# Patient Record
Sex: Male | Born: 1945 | Race: White | Hispanic: No | Marital: Married | Smoking: Current every day smoker
Health system: Southern US, Community
[De-identification: ages and names within clinical notes are randomized; demographics above are authoritative.]

## PROBLEM LIST (undated history)

## (undated) DIAGNOSIS — N529 Male erectile dysfunction, unspecified: Secondary | ICD-10-CM

## (undated) DIAGNOSIS — K649 Unspecified hemorrhoids: Secondary | ICD-10-CM

## (undated) DIAGNOSIS — J45909 Unspecified asthma, uncomplicated: Secondary | ICD-10-CM

## (undated) DIAGNOSIS — I5189 Other ill-defined heart diseases: Secondary | ICD-10-CM

## (undated) DIAGNOSIS — F419 Anxiety disorder, unspecified: Secondary | ICD-10-CM

## (undated) DIAGNOSIS — K7030 Alcoholic cirrhosis of liver without ascites: Secondary | ICD-10-CM

## (undated) DIAGNOSIS — I861 Scrotal varices: Secondary | ICD-10-CM

## (undated) DIAGNOSIS — Z9289 Personal history of other medical treatment: Secondary | ICD-10-CM

## (undated) DIAGNOSIS — M549 Dorsalgia, unspecified: Secondary | ICD-10-CM

## (undated) DIAGNOSIS — I8500 Esophageal varices without bleeding: Secondary | ICD-10-CM

## (undated) DIAGNOSIS — I519 Heart disease, unspecified: Secondary | ICD-10-CM

## (undated) DIAGNOSIS — K5730 Diverticulosis of large intestine without perforation or abscess without bleeding: Secondary | ICD-10-CM

## (undated) DIAGNOSIS — K219 Gastro-esophageal reflux disease without esophagitis: Secondary | ICD-10-CM

## (undated) DIAGNOSIS — H9190 Unspecified hearing loss, unspecified ear: Secondary | ICD-10-CM

## (undated) DIAGNOSIS — K766 Portal hypertension: Secondary | ICD-10-CM

## (undated) DIAGNOSIS — C4491 Basal cell carcinoma of skin, unspecified: Secondary | ICD-10-CM

## (undated) DIAGNOSIS — A045 Campylobacter enteritis: Secondary | ICD-10-CM

## (undated) DIAGNOSIS — Z87898 Personal history of other specified conditions: Secondary | ICD-10-CM

## (undated) DIAGNOSIS — Z8709 Personal history of other diseases of the respiratory system: Secondary | ICD-10-CM

## (undated) DIAGNOSIS — H269 Unspecified cataract: Secondary | ICD-10-CM

## (undated) DIAGNOSIS — D696 Thrombocytopenia, unspecified: Secondary | ICD-10-CM

## (undated) DIAGNOSIS — N433 Hydrocele, unspecified: Secondary | ICD-10-CM

## (undated) DIAGNOSIS — I10 Essential (primary) hypertension: Secondary | ICD-10-CM

## (undated) DIAGNOSIS — F1120 Opioid dependence, uncomplicated: Secondary | ICD-10-CM

## (undated) DIAGNOSIS — F32A Depression, unspecified: Secondary | ICD-10-CM

## (undated) DIAGNOSIS — R161 Splenomegaly, not elsewhere classified: Secondary | ICD-10-CM

## (undated) DIAGNOSIS — F1020 Alcohol dependence, uncomplicated: Secondary | ICD-10-CM

## (undated) DIAGNOSIS — K5520 Angiodysplasia of colon without hemorrhage: Secondary | ICD-10-CM

## (undated) DIAGNOSIS — M51369 Other intervertebral disc degeneration, lumbar region without mention of lumbar back pain or lower extremity pain: Secondary | ICD-10-CM

## (undated) DIAGNOSIS — N5089 Other specified disorders of the male genital organs: Secondary | ICD-10-CM

## (undated) DIAGNOSIS — G479 Sleep disorder, unspecified: Secondary | ICD-10-CM

## (undated) DIAGNOSIS — D62 Acute posthemorrhagic anemia: Secondary | ICD-10-CM

## (undated) DIAGNOSIS — M5136 Other intervertebral disc degeneration, lumbar region: Secondary | ICD-10-CM

## (undated) DIAGNOSIS — K259 Gastric ulcer, unspecified as acute or chronic, without hemorrhage or perforation: Secondary | ICD-10-CM

## (undated) DIAGNOSIS — K922 Gastrointestinal hemorrhage, unspecified: Secondary | ICD-10-CM

## (undated) DIAGNOSIS — Z86010 Personal history of colonic polyps: Secondary | ICD-10-CM

## (undated) DIAGNOSIS — I82411 Acute embolism and thrombosis of right femoral vein: Secondary | ICD-10-CM

## (undated) DIAGNOSIS — F329 Major depressive disorder, single episode, unspecified: Secondary | ICD-10-CM

## (undated) HISTORY — PX: POLYPECTOMY: SHX149

## (undated) HISTORY — DX: Alcoholic cirrhosis of liver without ascites: K70.30

## (undated) HISTORY — DX: Gastro-esophageal reflux disease without esophagitis: K21.9

## (undated) HISTORY — DX: Unspecified cataract: H26.9

## (undated) HISTORY — DX: Anxiety disorder, unspecified: F41.9

## (undated) HISTORY — DX: Male erectile dysfunction, unspecified: N52.9

## (undated) HISTORY — DX: Basal cell carcinoma of skin, unspecified: C44.91

## (undated) HISTORY — DX: Other specified disorders of the male genital organs: N50.89

## (undated) HISTORY — DX: Esophageal varices without bleeding: I85.00

## (undated) HISTORY — DX: Gastrointestinal hemorrhage, unspecified: K92.2

## (undated) HISTORY — DX: Unspecified asthma, uncomplicated: J45.909

## (undated) HISTORY — PX: EYE SURGERY: SHX253

## (undated) HISTORY — DX: Acute posthemorrhagic anemia: D62

## (undated) HISTORY — PX: COLONOSCOPY: SHX174

## (undated) HISTORY — DX: Unspecified hemorrhoids: K64.9

## (undated) HISTORY — DX: Personal history of colonic polyps: Z86.010

## (undated) HISTORY — DX: Scrotal varices: I86.1

## (undated) HISTORY — PX: OTHER SURGICAL HISTORY: SHX169

## (undated) HISTORY — DX: Sleep disorder, unspecified: G47.9

## (undated) HISTORY — DX: Gastric ulcer, unspecified as acute or chronic, without hemorrhage or perforation: K25.9

## (undated) HISTORY — DX: Thrombocytopenia, unspecified: D69.6

## (undated) HISTORY — DX: Alcohol dependence, uncomplicated: F10.20

## (undated) HISTORY — DX: Depression, unspecified: F32.A

## (undated) HISTORY — DX: Major depressive disorder, single episode, unspecified: F32.9

## (undated) HISTORY — DX: Diverticulosis of large intestine without perforation or abscess without bleeding: K57.30

## (undated) HISTORY — DX: Campylobacter enteritis: A04.5

## (undated) HISTORY — PX: COLONOSCOPY: SHX5424

## (undated) HISTORY — DX: Essential (primary) hypertension: I10

## (undated) HISTORY — DX: Angiodysplasia of colon without hemorrhage: K55.20

---
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
## 1999-12-11 ENCOUNTER — Encounter: Payer: Self-pay | Admitting: Emergency Medicine

## 1999-12-11 ENCOUNTER — Emergency Department (HOSPITAL_COMMUNITY): Admission: EM | Admit: 1999-12-11 | Discharge: 1999-12-11 | Payer: Self-pay | Admitting: Emergency Medicine

## 2000-01-29 ENCOUNTER — Encounter (INDEPENDENT_AMBULATORY_CARE_PROVIDER_SITE_OTHER): Payer: Self-pay

## 2000-01-29 ENCOUNTER — Ambulatory Visit (HOSPITAL_COMMUNITY): Admission: RE | Admit: 2000-01-29 | Discharge: 2000-01-29 | Payer: Self-pay | Admitting: Gastroenterology

## 2001-05-13 ENCOUNTER — Encounter: Admission: RE | Admit: 2001-05-13 | Discharge: 2001-05-13 | Payer: Self-pay | Admitting: Family Medicine

## 2001-05-13 ENCOUNTER — Encounter: Payer: Self-pay | Admitting: Family Medicine

## 2002-02-24 ENCOUNTER — Encounter: Payer: Self-pay | Admitting: Family Medicine

## 2002-02-24 ENCOUNTER — Encounter: Admission: RE | Admit: 2002-02-24 | Discharge: 2002-02-24 | Payer: Self-pay | Admitting: Family Medicine

## 2002-10-18 ENCOUNTER — Inpatient Hospital Stay (HOSPITAL_COMMUNITY): Admission: EM | Admit: 2002-10-18 | Discharge: 2002-10-19 | Payer: Self-pay | Admitting: Emergency Medicine

## 2003-04-13 ENCOUNTER — Encounter (INDEPENDENT_AMBULATORY_CARE_PROVIDER_SITE_OTHER): Payer: Self-pay | Admitting: Specialist

## 2003-04-13 ENCOUNTER — Ambulatory Visit (HOSPITAL_COMMUNITY): Admission: RE | Admit: 2003-04-13 | Discharge: 2003-04-13 | Payer: Self-pay | Admitting: Gastroenterology

## 2004-07-15 HISTORY — PX: ESOPHAGOGASTRODUODENOSCOPY: SHX1529

## 2005-05-26 ENCOUNTER — Ambulatory Visit: Payer: Self-pay | Admitting: Gastroenterology

## 2005-05-27 ENCOUNTER — Inpatient Hospital Stay (HOSPITAL_COMMUNITY): Admission: EM | Admit: 2005-05-27 | Discharge: 2005-05-30 | Payer: Self-pay | Admitting: Emergency Medicine

## 2005-10-25 ENCOUNTER — Encounter: Admission: RE | Admit: 2005-10-25 | Discharge: 2005-10-25 | Payer: Self-pay | Admitting: Family Medicine

## 2006-01-18 ENCOUNTER — Encounter: Payer: Self-pay | Admitting: Internal Medicine

## 2006-02-27 ENCOUNTER — Ambulatory Visit: Payer: Self-pay | Admitting: Family Medicine

## 2006-03-04 ENCOUNTER — Ambulatory Visit: Payer: Self-pay | Admitting: Family Medicine

## 2006-03-06 ENCOUNTER — Encounter: Admission: RE | Admit: 2006-03-06 | Discharge: 2006-03-06 | Payer: Self-pay | Admitting: Family Medicine

## 2006-03-10 ENCOUNTER — Ambulatory Visit: Payer: Self-pay | Admitting: Family Medicine

## 2006-04-03 ENCOUNTER — Encounter: Admission: RE | Admit: 2006-04-03 | Discharge: 2006-07-02 | Payer: Self-pay | Admitting: Neurosurgery

## 2006-05-15 ENCOUNTER — Ambulatory Visit: Payer: Self-pay | Admitting: Family Medicine

## 2006-05-15 LAB — CONVERTED CEMR LAB
AST: 32 units/L (ref 0–37)
Chol/HDL Ratio, serum: 6.3
Cholesterol: 206 mg/dL (ref 0–200)
HCT: 44.7 % (ref 39.0–52.0)
Hemoglobin: 14.7 g/dL (ref 13.0–17.0)
LDL DIRECT: 112.4 mg/dL
MCHC: 33 g/dL (ref 30.0–36.0)
MCV: 95.4 fL (ref 78.0–100.0)
VLDL: 34 mg/dL (ref 0–40)

## 2006-05-22 LAB — HM COLONOSCOPY

## 2006-05-29 ENCOUNTER — Ambulatory Visit: Payer: Self-pay | Admitting: Family Medicine

## 2006-05-29 LAB — CONVERTED CEMR LAB
Basophils Absolute: 0 10*3/uL (ref 0.0–0.1)
Eosinophil percent: 3.2 % (ref 0.0–5.0)
INR: 1 (ref 0.9–2.0)
MCHC: 33.6 g/dL (ref 30.0–36.0)
Monocytes Absolute: 0.4 10*3/uL (ref 0.2–0.7)
Platelets: 132 10*3/uL — ABNORMAL LOW (ref 150–400)
RBC: 4.55 M/uL (ref 4.22–5.81)

## 2006-06-11 ENCOUNTER — Ambulatory Visit: Payer: Self-pay | Admitting: Oncology

## 2006-06-12 ENCOUNTER — Ambulatory Visit: Payer: Self-pay | Admitting: Family Medicine

## 2006-06-25 LAB — CBC WITH DIFFERENTIAL/PLATELET
Basophils Absolute: 0 10*3/uL (ref 0.0–0.1)
Eosinophils Absolute: 0.1 10*3/uL (ref 0.0–0.5)
LYMPH%: 13.1 % — ABNORMAL LOW (ref 14.0–48.0)
MCV: 94.6 fL (ref 81.6–98.0)
MONO%: 3.2 % (ref 0.0–13.0)
NEUT#: 12.3 10*3/uL — ABNORMAL HIGH (ref 1.5–6.5)
Platelets: 151 10*3/uL (ref 145–400)
RBC: 4.85 10*6/uL (ref 4.20–5.71)

## 2006-06-25 LAB — PROTHROMBIN TIME
INR: 1.2 (ref 0.0–1.5)
Prothrombin Time: 15.6 seconds — ABNORMAL HIGH (ref 11.6–15.2)

## 2006-06-25 LAB — APTT: aPTT: 37 seconds (ref 24–37)

## 2006-06-25 LAB — COMPREHENSIVE METABOLIC PANEL
CO2: 27 mEq/L (ref 19–32)
Creatinine, Ser: 0.95 mg/dL (ref 0.40–1.50)
Glucose, Bld: 112 mg/dL — ABNORMAL HIGH (ref 70–99)
Total Bilirubin: 1.5 mg/dL — ABNORMAL HIGH (ref 0.3–1.2)

## 2006-06-25 LAB — CHCC SMEAR

## 2006-07-15 HISTORY — PX: KNEE ARTHROSCOPY: SHX127

## 2006-08-06 DIAGNOSIS — F1010 Alcohol abuse, uncomplicated: Secondary | ICD-10-CM | POA: Insufficient documentation

## 2006-08-06 DIAGNOSIS — G8929 Other chronic pain: Secondary | ICD-10-CM | POA: Insufficient documentation

## 2006-08-06 DIAGNOSIS — K319 Disease of stomach and duodenum, unspecified: Secondary | ICD-10-CM

## 2006-08-06 DIAGNOSIS — M549 Dorsalgia, unspecified: Secondary | ICD-10-CM

## 2006-08-06 DIAGNOSIS — I10 Essential (primary) hypertension: Secondary | ICD-10-CM

## 2006-08-06 DIAGNOSIS — F329 Major depressive disorder, single episode, unspecified: Secondary | ICD-10-CM

## 2006-08-06 DIAGNOSIS — K5730 Diverticulosis of large intestine without perforation or abscess without bleeding: Secondary | ICD-10-CM | POA: Insufficient documentation

## 2006-08-06 DIAGNOSIS — K3189 Other diseases of stomach and duodenum: Secondary | ICD-10-CM | POA: Insufficient documentation

## 2006-08-19 ENCOUNTER — Ambulatory Visit: Payer: Self-pay | Admitting: Family Medicine

## 2006-08-21 ENCOUNTER — Encounter: Payer: Self-pay | Admitting: Internal Medicine

## 2006-08-29 ENCOUNTER — Ambulatory Visit: Payer: Self-pay | Admitting: Family Medicine

## 2006-08-29 ENCOUNTER — Encounter: Payer: Self-pay | Admitting: Internal Medicine

## 2006-09-02 ENCOUNTER — Ambulatory Visit: Payer: Self-pay | Admitting: Family Medicine

## 2006-09-02 ENCOUNTER — Encounter: Admission: RE | Admit: 2006-09-02 | Discharge: 2006-09-02 | Payer: Self-pay | Admitting: Family Medicine

## 2006-09-02 LAB — CONVERTED CEMR LAB
BUN: 9 mg/dL (ref 6–23)
Basophils Absolute: 0 10*3/uL (ref 0.0–0.1)
Bilirubin, Direct: 0.1 mg/dL (ref 0.0–0.3)
Calcium: 9.6 mg/dL (ref 8.4–10.5)
Chloride: 101 meq/L (ref 96–112)
Creatinine, Ser: 0.7 mg/dL (ref 0.4–1.5)
HCT: 47.2 % (ref 39.0–52.0)
Lymphocytes Relative: 31.1 % (ref 12.0–46.0)
Monocytes Relative: 6.2 % (ref 3.0–11.0)
Neutrophils Relative %: 59.8 % (ref 43.0–77.0)
Platelets: 118 10*3/uL — ABNORMAL LOW (ref 150–400)
Potassium: 4.6 meq/L (ref 3.5–5.1)
RBC: 4.91 M/uL (ref 4.22–5.81)
Sodium: 139 meq/L (ref 135–145)
Total Protein: 7 g/dL (ref 6.0–8.3)
WBC: 6 10*3/uL (ref 4.5–10.5)

## 2006-09-11 ENCOUNTER — Ambulatory Visit: Payer: Self-pay | Admitting: Cardiovascular Disease

## 2007-05-18 ENCOUNTER — Telehealth (INDEPENDENT_AMBULATORY_CARE_PROVIDER_SITE_OTHER): Payer: Self-pay | Admitting: *Deleted

## 2007-06-30 ENCOUNTER — Telehealth (INDEPENDENT_AMBULATORY_CARE_PROVIDER_SITE_OTHER): Payer: Self-pay | Admitting: *Deleted

## 2007-07-06 ENCOUNTER — Ambulatory Visit: Payer: Self-pay | Admitting: Family Medicine

## 2007-07-23 ENCOUNTER — Ambulatory Visit: Payer: Self-pay | Admitting: Family Medicine

## 2007-07-24 ENCOUNTER — Encounter (INDEPENDENT_AMBULATORY_CARE_PROVIDER_SITE_OTHER): Payer: Self-pay | Admitting: *Deleted

## 2007-07-24 LAB — CONVERTED CEMR LAB
AST: 33 units/L (ref 0–37)
Albumin: 3.7 g/dL (ref 3.5–5.2)
BUN: 5 mg/dL — ABNORMAL LOW (ref 6–23)
Basophils Absolute: 0 10*3/uL (ref 0.0–0.1)
Basophils Relative: 0.1 % (ref 0.0–1.0)
Calcium: 8.9 mg/dL (ref 8.4–10.5)
Cholesterol: 203 mg/dL (ref 0–200)
Creatinine, Ser: 0.9 mg/dL (ref 0.4–1.5)
Eosinophils Absolute: 0.1 10*3/uL (ref 0.0–0.6)
GFR calc Af Amer: 111 mL/min
GFR calc non Af Amer: 92 mL/min
Monocytes Absolute: 0.4 10*3/uL (ref 0.2–0.7)
Monocytes Relative: 7.5 % (ref 3.0–11.0)
Neutro Abs: 3 10*3/uL (ref 1.4–7.7)
Neutrophils Relative %: 58.8 % (ref 43.0–77.0)
PSA: 0.54 ng/mL (ref 0.10–4.00)
Platelets: 105 10*3/uL — ABNORMAL LOW (ref 150–400)
Potassium: 3.5 meq/L (ref 3.5–5.1)
Total Bilirubin: 1 mg/dL (ref 0.3–1.2)
Triglycerides: 176 mg/dL — ABNORMAL HIGH (ref 0–149)
VLDL: 35 mg/dL (ref 0–40)

## 2007-07-30 ENCOUNTER — Telehealth (INDEPENDENT_AMBULATORY_CARE_PROVIDER_SITE_OTHER): Payer: Self-pay | Admitting: *Deleted

## 2007-07-31 ENCOUNTER — Telehealth (INDEPENDENT_AMBULATORY_CARE_PROVIDER_SITE_OTHER): Payer: Self-pay | Admitting: *Deleted

## 2007-10-12 ENCOUNTER — Ambulatory Visit: Payer: Self-pay | Admitting: Internal Medicine

## 2007-10-12 DIAGNOSIS — M5137 Other intervertebral disc degeneration, lumbosacral region: Secondary | ICD-10-CM

## 2007-10-12 DIAGNOSIS — G479 Sleep disorder, unspecified: Secondary | ICD-10-CM | POA: Insufficient documentation

## 2007-10-16 ENCOUNTER — Encounter: Payer: Self-pay | Admitting: Internal Medicine

## 2007-12-23 ENCOUNTER — Telehealth (INDEPENDENT_AMBULATORY_CARE_PROVIDER_SITE_OTHER): Payer: Self-pay | Admitting: *Deleted

## 2007-12-23 ENCOUNTER — Telehealth: Payer: Self-pay | Admitting: Internal Medicine

## 2007-12-31 ENCOUNTER — Encounter: Payer: Self-pay | Admitting: Internal Medicine

## 2008-03-07 ENCOUNTER — Telehealth (INDEPENDENT_AMBULATORY_CARE_PROVIDER_SITE_OTHER): Payer: Self-pay | Admitting: *Deleted

## 2008-04-12 ENCOUNTER — Ambulatory Visit: Payer: Self-pay | Admitting: Internal Medicine

## 2008-05-24 ENCOUNTER — Telehealth: Payer: Self-pay | Admitting: Internal Medicine

## 2008-07-01 ENCOUNTER — Telehealth: Payer: Self-pay | Admitting: Internal Medicine

## 2008-08-03 ENCOUNTER — Ambulatory Visit: Payer: Self-pay | Admitting: Internal Medicine

## 2008-08-04 LAB — CONVERTED CEMR LAB
ALT: 40 units/L (ref 0–53)
AST: 49 units/L — ABNORMAL HIGH (ref 0–37)
Alkaline Phosphatase: 88 units/L (ref 39–117)
Basophils Absolute: 0 10*3/uL (ref 0.0–0.1)
Bilirubin, Direct: 0.2 mg/dL (ref 0.0–0.3)
CO2: 31 meq/L (ref 19–32)
Chloride: 108 meq/L (ref 96–112)
Creatinine, Ser: 0.9 mg/dL (ref 0.4–1.5)
GFR calc non Af Amer: 91 mL/min
Glucose, Bld: 107 mg/dL — ABNORMAL HIGH (ref 70–99)
Monocytes Absolute: 0.4 10*3/uL (ref 0.1–1.0)
Potassium: 5.1 meq/L (ref 3.5–5.1)
RBC: 4.23 M/uL (ref 4.22–5.81)
TSH: 2.64 microintl units/mL (ref 0.35–5.50)
Total Protein: 6.8 g/dL (ref 6.0–8.3)
WBC: 5.4 10*3/uL (ref 4.5–10.5)

## 2008-08-05 ENCOUNTER — Telehealth: Payer: Self-pay | Admitting: Internal Medicine

## 2008-11-03 ENCOUNTER — Telehealth: Payer: Self-pay | Admitting: Internal Medicine

## 2009-01-24 ENCOUNTER — Telehealth: Payer: Self-pay | Admitting: Internal Medicine

## 2009-02-08 ENCOUNTER — Ambulatory Visit: Payer: Self-pay | Admitting: Internal Medicine

## 2009-04-06 ENCOUNTER — Telehealth: Payer: Self-pay | Admitting: Internal Medicine

## 2009-07-06 ENCOUNTER — Telehealth: Payer: Self-pay | Admitting: Internal Medicine

## 2009-08-09 ENCOUNTER — Ambulatory Visit: Payer: Self-pay | Admitting: Internal Medicine

## 2009-08-11 LAB — CONVERTED CEMR LAB
AST: 45 units/L — ABNORMAL HIGH (ref 0–37)
Alkaline Phosphatase: 105 units/L (ref 39–117)
BUN: 6 mg/dL (ref 6–23)
Basophils Absolute: 0.1 10*3/uL (ref 0.0–0.1)
Calcium: 9.4 mg/dL (ref 8.4–10.5)
Chloride: 103 meq/L (ref 96–112)
Cholesterol: 160 mg/dL (ref 0–200)
Creatinine, Ser: 0.8 mg/dL (ref 0.4–1.5)
Eosinophils Relative: 1.8 % (ref 0.0–5.0)
HCT: 46.5 % (ref 39.0–52.0)
HDL: 70.3 mg/dL (ref >39.00)
Lymphocytes Relative: 21.2 % (ref 12.0–46.0)
Lymphs Abs: 1.2 10*3/uL (ref 0.7–4.0)
MCHC: 32.4 g/dL (ref 30.0–36.0)
Neutro Abs: 3.8 10*3/uL (ref 1.4–7.7)
Sodium: 140 meq/L (ref 135–145)
Total Bilirubin: 1.4 mg/dL — ABNORMAL HIGH (ref 0.3–1.2)
Total CHOL/HDL Ratio: 2
WBC: 5.6 10*3/uL (ref 4.5–10.5)

## 2009-08-31 ENCOUNTER — Telehealth: Payer: Self-pay | Admitting: Internal Medicine

## 2009-09-24 ENCOUNTER — Emergency Department (HOSPITAL_COMMUNITY): Admission: EM | Admit: 2009-09-24 | Discharge: 2009-09-24 | Payer: Self-pay | Admitting: Emergency Medicine

## 2009-09-25 ENCOUNTER — Encounter: Payer: Self-pay | Admitting: Internal Medicine

## 2009-09-25 ENCOUNTER — Telehealth: Payer: Self-pay | Admitting: Internal Medicine

## 2009-10-16 ENCOUNTER — Telehealth: Payer: Self-pay | Admitting: Internal Medicine

## 2009-10-24 ENCOUNTER — Encounter: Payer: Self-pay | Admitting: Internal Medicine

## 2009-10-25 ENCOUNTER — Inpatient Hospital Stay (HOSPITAL_COMMUNITY): Admission: AD | Admit: 2009-10-25 | Discharge: 2009-10-29 | Payer: Self-pay | Admitting: Urology

## 2009-11-01 ENCOUNTER — Telehealth: Payer: Self-pay | Admitting: Internal Medicine

## 2009-11-10 ENCOUNTER — Ambulatory Visit: Payer: Self-pay | Admitting: Internal Medicine

## 2009-11-30 ENCOUNTER — Telehealth: Payer: Self-pay | Admitting: Internal Medicine

## 2010-01-01 ENCOUNTER — Telehealth: Payer: Self-pay | Admitting: Internal Medicine

## 2010-02-05 ENCOUNTER — Telehealth: Payer: Self-pay | Admitting: Internal Medicine

## 2010-02-07 ENCOUNTER — Ambulatory Visit: Payer: Self-pay | Admitting: Internal Medicine

## 2010-03-05 ENCOUNTER — Telehealth: Payer: Self-pay | Admitting: Internal Medicine

## 2010-04-03 ENCOUNTER — Telehealth: Payer: Self-pay | Admitting: Internal Medicine

## 2010-05-31 ENCOUNTER — Telehealth: Payer: Self-pay | Admitting: Internal Medicine

## 2010-07-27 ENCOUNTER — Other Ambulatory Visit: Payer: Self-pay | Admitting: Internal Medicine

## 2010-07-27 ENCOUNTER — Ambulatory Visit
Admission: RE | Admit: 2010-07-27 | Discharge: 2010-07-27 | Payer: Self-pay | Source: Home / Self Care | Attending: Internal Medicine | Admitting: Internal Medicine

## 2010-07-27 LAB — RENAL FUNCTION PANEL
Albumin: 3.6 g/dL (ref 3.5–5.2)
BUN: 10 mg/dL (ref 6–23)
CO2: 32 mEq/L (ref 19–32)
Calcium: 9 mg/dL (ref 8.4–10.5)
Chloride: 101 mEq/L (ref 96–112)
Creatinine, Ser: 0.7 mg/dL (ref 0.4–1.5)
GFR: 127.6 mL/min (ref >60.00)
Glucose, Bld: 96 mg/dL (ref 70–99)
Phosphorus: 3.4 mg/dL (ref 2.3–4.6)
Potassium: 4.6 mEq/L (ref 3.5–5.1)
Sodium: 140 mEq/L (ref 135–145)

## 2010-07-27 LAB — HEPATIC FUNCTION PANEL
ALT: 31 U/L (ref 0–53)
AST: 50 U/L — ABNORMAL HIGH (ref 0–37)
Albumin: 3.6 g/dL (ref 3.5–5.2)
Alkaline Phosphatase: 128 U/L — ABNORMAL HIGH (ref 39–117)
Bilirubin, Direct: 0.4 mg/dL — ABNORMAL HIGH (ref 0.0–0.3)
Total Bilirubin: 1.4 mg/dL — ABNORMAL HIGH (ref 0.3–1.2)
Total Protein: 6.9 g/dL (ref 6.0–8.3)

## 2010-07-27 LAB — CBC WITH DIFFERENTIAL/PLATELET
Basophils Absolute: 0 10*3/uL (ref 0.0–0.1)
Basophils Relative: 0.7 % (ref 0.0–3.0)
Eosinophils Absolute: 0.1 10*3/uL (ref 0.0–0.7)
Eosinophils Relative: 1.4 % (ref 0.0–5.0)
HCT: 44.7 % (ref 39.0–52.0)
Hemoglobin: 15.4 g/dL (ref 13.0–17.0)
Lymphocytes Relative: 19.5 % (ref 12.0–46.0)
Lymphs Abs: 1 10*3/uL (ref 0.7–4.0)
MCHC: 34.5 g/dL (ref 30.0–36.0)
MCV: 100.9 fl — ABNORMAL HIGH (ref 78.0–100.0)
Monocytes Absolute: 0.5 10*3/uL (ref 0.1–1.0)
Monocytes Relative: 8.8 % (ref 3.0–12.0)
Neutro Abs: 3.7 10*3/uL (ref 1.4–7.7)
Neutrophils Relative %: 69.6 % (ref 43.0–77.0)
Platelets: 108 10*3/uL — ABNORMAL LOW (ref 150.0–400.0)
RBC: 4.43 Mil/uL (ref 4.22–5.81)
RDW: 14.6 % (ref 11.5–14.6)
WBC: 5.4 10*3/uL (ref 4.5–10.5)

## 2010-07-27 LAB — TSH: TSH: 3.69 u[IU]/mL (ref 0.35–5.50)

## 2010-07-31 ENCOUNTER — Telehealth: Payer: Self-pay | Admitting: Internal Medicine

## 2010-08-05 ENCOUNTER — Encounter: Payer: Self-pay | Admitting: Family Medicine

## 2010-08-14 NOTE — Letter (Signed)
Summary: [facility]  [facility]   Imported By: Lanelle Bal 11/07/2009 09:51:52  _____________________________________________________________________  External Attachment:    Type:   Image     Comment:   External Document  Appended Document: [facility] epididymitis and cellulitis of scrotum

## 2010-08-14 NOTE — Progress Notes (Signed)
Summary: refill request for vicodin  Phone Note Refill Request Message from:  Fax from Pharmacy  Refills Requested: Medication #1:  HYDROCODONE-ACETAMINOPHEN 5-325 MG TABS take 1 by mouth three times a day as needed   Last Refilled: 11/01/2009 Faxed request from [place] is on your desk.  Initial call taken by: Lowella Petties CMA,  Nov 30, 2009 12:49 PM  Follow-up for Phone Call        okay #90 x 0 Follow-up by: Cindee Salt MD,  Nov 30, 2009 1:09 PM  Additional Follow-up for Phone Call Additional follow up Details #1::        Rx faxed to pharmacy Additional Follow-up by: DeShannon Smith CMA Duncan Dull),  Nov 30, 2009 3:13 PM    Prescriptions: HYDROCODONE-ACETAMINOPHEN 5-325 MG TABS (HYDROCODONE-ACETAMINOPHEN) take 1 by mouth three times a day as needed  #90 x 0   Entered by:   Mervin Hack CMA (AAMA)   Authorized by:   Cindee Salt MD   Signed by:   Mervin Hack CMA (AAMA) on 11/30/2009   Method used:   Handwritten   RxID:   [id number]

## 2010-08-14 NOTE — Progress Notes (Signed)
Summary: Rx Hydrocodone  Phone Note Refill Request Call back at [phone] Message from:  [facility] on August 31, 2009 3:55 PM  Refills Requested: Medication #1:  HYDROCODONE-ACETAMINOPHEN 5-325 MG TABS take 1 by mouth three times a day as needed.   Last Refilled: 07/29/2009 Received faxed refill request, form in your IN box   Method Requested: Fax to Local Pharmacy Initial call taken by: Linde Gillis CMA Duncan Dull),  August 31, 2009 3:55 PM  Follow-up for Phone Call        okay #90 x 1 Follow-up by: Cindee Salt MD,  August 31, 2009 5:24 PM  Additional Follow-up for Phone Call Additional follow up Details #1::        Rx faxed to pharmacy Additional Follow-up by: DeShannon Smith CMA Duncan Dull),  September 01, 2009 8:06 AM    Prescriptions: HYDROCODONE-ACETAMINOPHEN 5-325 MG TABS (HYDROCODONE-ACETAMINOPHEN) take 1 by mouth three times a day as needed  #90 x 1   Entered by:   Mervin Hack CMA (AAMA)   Authorized by:   Cindee Salt MD   Signed by:   Mervin Hack CMA (AAMA) on 09/01/2009   Method used:   Handwritten   RxID:   [id number]

## 2010-08-14 NOTE — Progress Notes (Signed)
Summary: refill request for vicodin  Phone Note Refill Request Message from:  Fax from Pharmacy  Refills Requested: Medication #1:  HYDROCODONE-ACETAMINOPHEN 5-325 MG TABS take 1 by mouth three times a day as needed.   Last Refilled: 05/01/2010 Faxed request from [place] is on your desk.  Initial call taken by: Lowella Petties CMA, AAMA,  May 31, 2010 1:05 PM  Follow-up for Phone Call        okay #90 x 1 Follow-up by: Cindee Salt MD,  May 31, 2010 2:01 PM  Additional Follow-up for Phone Call Additional follow up Details #1::        Rx faxed to pharmacy Additional Follow-up by: DeShannon Smith CMA Duncan Dull),  May 31, 2010 2:21 PM    Prescriptions: HYDROCODONE-ACETAMINOPHEN 5-325 MG TABS (HYDROCODONE-ACETAMINOPHEN) take 1 by mouth three times a day as needed  #90 x 1   Entered by:   Mervin Hack CMA (AAMA)   Authorized by:   Cindee Salt MD   Signed by:   Mervin Hack CMA (AAMA) on 05/31/2010   Method used:   Handwritten   RxID:   [id number]

## 2010-08-14 NOTE — Progress Notes (Signed)
Summary: refill request for vicodin  Phone Note Refill Request Message from:  Fax from Pharmacy  Refills Requested: Medication #1:  HYDROCODONE-ACETAMINOPHEN 5-325 MG TABS take 1 by mouth three times a day as needed   Last Refilled: 01/11/2010 Faxed request from [place] is on your desk.  Initial call taken by: Lowella Petties CMA,  February 05, 2010 10:52 AM  Follow-up for Phone Call        okay #90 x 0 Follow-up by: Cindee Salt MD,  February 05, 2010 1:04 PM    Prescriptions: HYDROCODONE-ACETAMINOPHEN 5-325 MG TABS (HYDROCODONE-ACETAMINOPHEN) take 1 by mouth three times a day as needed  #90 x 0   Entered by:   Lowella Petties CMA   Authorized by:   Cindee Salt MD   Signed by:   Lowella Petties CMA on 02/05/2010   Method used:   Telephoned to ...       [facility]* (retail)       [address]       [place], [place]  [address]       Ph: [phone]       Fax: [phone]   RxID:   [id number]

## 2010-08-14 NOTE — Assessment & Plan Note (Signed)
Summary: F/U AND DO LAB WORK/DLO   Vital Signs:  Patient profile:   65 year old male Weight:      192 pounds BMI:     23.46 Temp:     98.3 degrees F oral Pulse rate:   80 / minute Pulse rhythm:   regular BP sitting:   130 / 80  (left arm) Cuff size:   large  Vitals Entered By: Mervin Hack CMA Duncan Dull) (November 10, 2009 10:42 AM) CC: follow-up visit   History of Present Illness: Still having some scrotal swelling Much better than it had been though Needed increased hydrocodone due to the pain  urinating fine throughout did have 5 day hospitalization for fear of abscess has follow up with urologist on Monday  back pain is stable scrotal pain was so bad for a while that it was not noticeable at that time   Allergies: No Known Drug Allergies  Past History:  Past medical, surgical, family and social histories (including risk factors) reviewed for relevance to current acute and chronic problems.  Past Medical History: Reviewed history from 08/03/2008 and no changes required. 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 Varicoele repair 1610'R Right  knee arthrosopic surgery--2008 Left knee arthroscopy--2008  Family History: Reviewed history from 10/12/2007 and no changes required. Mom died @84  of stroke, had DM Dad died @56  killed in robbery 2 brother, 1 sister No CAD, HTN No prostate or colon cancer  Social History: Reviewed history from 08/09/2009 and no changes required. Married--2nd  1 son (adopted) from previous marriage Current Smoker: only some at night. 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     Impression &  Recommendations:  Problem # 1:  DEGENERATIVE DISC DISEASE, LUMBAR SPINE (ICD-722.52) Assessment Unchanged needs ongoing narcotic pain relievers discussed narcotic contract we are considering and what it entails It appears his increased need was perfectly reasonable  counselled all of 15 minute visit  Complete Medication List: 1)  Atenolol 25 Mg Tabs (Atenolol) .... Take one by mouth daily 2)  Prilosec 40 Mg Cpdr (Omeprazole) .... Take 1 by mouth every am 3)  Mirtazapine 45 Mg Tabs (Mirtazapine) .... Take one by mouth at bedtime 4)  Viagra 50 Mg Tabs (Sildenafil citrate) .... Take one by mouth as directed 5)  Hydrocodone-acetaminophen 5-325 Mg Tabs (Hydrocodone-acetaminophen) .... Take 1 by mouth three times a day as needed 6)  Cephalexin 500 Mg Caps (Cephalexin) .... Take 1 by mouth four times daily  Patient Instructions: 1)  Keep July appointment  Current Allergies (reviewed today): No known allergies

## 2010-08-14 NOTE — Progress Notes (Signed)
Summary: call a nurse  Phone Note Call from Patient   Caller: Patient Summary of Call: Triage Record Num: [id number] Operator: Durward Mallard DiMatteis Patient Name: Billy [name] Call Date & Time: 09/24/2009 2:29:04PM Patient Phone: [phone] PCP: Tillman Abide Patient Gender: Male PCP Fax : Patient DOB: 24-Aug-1945 Practice Name: [facility] Reason for Call: Caller states that earlier last wee had some tenderness in rt testicle; yesterday developed swelling approx size of a grapefruit on the rt side of the scrotum; pain above the scrotum as well; sent [facility] ER at [facility]; will comply; will have someone drive him; scrotum/testicle problem guideline Protocol(s) Used: Scrotum / Testicles Symptoms Recommended Outcome per Protocol: See ED Immediately Reason for Outcome: New marked swelling (twice the normal size as compared to usual appearance) Care Advice:  ~ Another adult should drive. Call EMS 911 if signs and symptoms of shock develop (such as unable to stand due to faintness, dizziness, or lightheadedness; new onset of confusion; slow to respond or difficult to awaken; skin is pale, gray, cool, or moist to touch; severe weakness; loss of consciousness).  ~  ~ IMMEDIATE ACTION 03/ Initial call taken by: Melody Comas,  September 25, 2009 9:40 AM  Follow-up for Phone Call        Please check on him Arya Boxley MD  September 25, 2009 10:41 AM   see other phone note Follow-up by: Mervin Hack CMA Duncan Dull),  September 25, 2009 12:52 PM

## 2010-08-14 NOTE — Progress Notes (Signed)
Summary: refill request for vicodin  Phone Note Refill Request Message from:  Fax from Pharmacy  Refills Requested: Medication #1:  HYDROCODONE-ACETAMINOPHEN 5-325 MG TABS take 1 by mouth three times a day as needed.   Last Refilled: 09/28/2009 Faxed request from [place] is on your desk.  Initial call taken by: Lowella Petties CMA,  November 01, 2009 12:38 PM  Follow-up for Phone Call        okay #90 x 0  try to get the notes from Dr Vonita Moss Follow-up by: Cindee Salt MD,  November 01, 2009 1:32 PM  Additional Follow-up for Phone Call Additional follow up Details #1::        Rx faxed to pharmacy, notes on your desk. DeShannon Smith CMA Duncan Dull)  November 01, 2009 2:38 PM   will review Additional Follow-up by: Cindee Salt MD,  November 01, 2009 7:40 PM

## 2010-08-14 NOTE — Progress Notes (Signed)
Summary: HYDROCODONE-ACETAMINOPHEN  Phone Note From Pharmacy   Caller: [facility] Call For: Dr. Alphonsus Sias  Summary of Call: note from pharmacy on your desk, states that pt received HYDROCODONE-ACETAMINOPHEN 5-325 MG TABS (HYDROCODONE-ACETAMINOPHEN) take 1 by mouth three times a day as needed  #90 x 1  from Dr. Alphonsus Sias on 09/28/2009 and now he is wanting to pay cash to receive  an additional #48 from Dr. Larey Dresser. Please advise if this is ok, per [facility]. The note was also faxed to Dr. Vonita Moss today. Form on your desk  Initial call taken by: Mervin Hack CMA Duncan Dull),  October 16, 2009 1:12 PM  Follow-up for Phone Call        Please notify [facility] that they should only fill my Rx  Let patient know that filling 2 different Rx like this may cause me to have to stop all his pain prescriptions. Do not ever do this again----if I prescribe pain meds, he cannot also get them from anyone else!!! Agnes Dia Crawford MD  October 16, 2009 1:41 PM   spoke with Rob at [facility] and advised only Dr. Karle Starch rx should be filled.  spoke with pt and he states that the hydrocele is painful and he was taking more he was taking 4-5 per day, he would like a weeks supply to last until he gets the rx from Dr. Vonita Moss, I advised pt that rx could not be filled. DeShannon Smith CMA Duncan Dull)  October 16, 2009 2:59 PM   If he is not having any pain relief, he will need reeval by Dr Vonita Moss. I will need direction from him about what is going on and his med needs for that before I can approve any more meds Cindee Salt MD  October 16, 2009 3:01 PM   Spoke with patient and advised results. He also spoke with Dr. Enos Fling nurse, who advised him to take hot baths and use heat.   Follow-up by: Mervin Hack CMA Duncan Dull),  October 16, 2009 5:14 PM  Additional Follow-up for Phone Call Additional follow up Details #1::        noted Additional Follow-up by: Cindee Salt MD,  October 16, 2009 5:32 PM

## 2010-08-14 NOTE — Progress Notes (Signed)
Summary: hydrocodone  Phone Note Refill Request Message from:  Fax from Pharmacy on April 03, 2010 3:08 PM  Refills Requested: Medication #1:  HYDROCODONE-ACETAMINOPHEN 5-325 MG TABS take 1 by mouth three times a day as needed.   Last Refilled: 03/05/2010 Refill request from [place]. Form is on your desk.   Initial call taken by: Melody Comas,  April 03, 2010 3:09 PM  Follow-up for Phone Call        okay #90 x 1 Follow-up by: Cindee Salt MD,  April 04, 2010 7:52 AM  Additional Follow-up for Phone Call Additional follow up Details #1::        Rx faxed to pharmacy Additional Follow-up by: DeShannon Smith CMA Duncan Dull),  April 04, 2010 9:35 AM    Prescriptions: HYDROCODONE-ACETAMINOPHEN 5-325 MG TABS (HYDROCODONE-ACETAMINOPHEN) take 1 by mouth three times a day as needed  #90 x 1   Entered by:   Mervin Hack CMA (AAMA)   Authorized by:   Cindee Salt MD   Signed by:   Mervin Hack CMA (AAMA) on 04/04/2010   Method used:   Handwritten   RxID:   [id number]

## 2010-08-14 NOTE — Progress Notes (Signed)
Summary: Pain  Phone Note Call from Patient Call back at [phone]   Caller: Patient Details for Reason: Micah Flesher to [facility] ER  Summary of Call: Patient calling to tell you that he went to the ER at [facility] yesterday for swelling and groin pain, they diagnosed him with a large hydrocele. They gave him pain meds and told him he should see a Urologidt right away. [facility] today at 1:15pm. Will try to pull off the ER notes to send to them for his appt.  Follow-up for Phone Call        tried calling pt, but he has 1:15pm appt today, will try calling after appt. DeShannon Smith CMA Duncan Dull)  September 25, 2009 12:52 PM   Okay Cindee Salt MD  September 25, 2009 1:40 PM   pt went to Dr. Vonita Moss and he gave him Cipro, the swelling has gone down but pt still has pain, pt started taking Cipro around 4pm Monday. Pt has a full blood and liver panel done which all came back normal. Pt states Dr. Vonita Moss will be sending over notes. DeShannon Smith CMA Duncan Dull)  September 27, 2009 9:04 AM    okay Follow-up by: Cindee Salt MD,  September 27, 2009 9:37 AM

## 2010-08-14 NOTE — Assessment & Plan Note (Signed)
Summary: follow up   Vital Signs:  Patient profile:   65 year old male Weight:      183.38 pounds BMI:     22.40 Temp:     99.0 degrees F oral Pulse rate:   80 / minute Pulse rhythm:   regular BP sitting:   136 / 78  (left arm) Cuff size:   regular  Vitals Entered By: Janee Morn CMA (February 07, 2010 10:02 AM) CC: follow-up visit   History of Present Illness: Feels good Scrotum is still enlarged but much better. No pain fortunately  Still needs the hydrocodone for his back Had bad day recently after car trip Averages three times a day --some variabliity  Checks BP occ usually 136/80 or so No headaches No chest pain No SOB No much exercise with all the heat  Mood has been fine  No recent stomach problems bowels generally fine  Allergies: No Known Drug Allergies  Past History:  Past medical, surgical, family and social histories (including risk factors) reviewed for relevance to current acute and chronic problems.  Past Medical History: Reviewed history from 08/03/2008 and no changes required. 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 Varicoele repair 1610'R Right  knee arthrosopic surgery--2008 Left knee arthroscopy--2008  Family History: Reviewed history from 10/12/2007 and no changes required. Mom died @84  of stroke, had DM Dad died @56  killed in robbery 2 brother, 1 sister No CAD, HTN No prostate or colon cancer  Social History: Reviewed history from 08/09/2009 and no changes required. Married--2nd  1 son (adopted) from previous marriage Current Smoker: only some at night. 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   Neck:  supple, no masses, no thyromegaly, no carotid bruits, and no cervical lymphadenopathy.   Lungs:  normal respiratory effort, no intercostal retractions, no accessory muscle use, and normal breath sounds.   Heart:  normal rate, regular rhythm, no murmur, and no gallop.   Abdomen:  soft, non-tender, and no masses.   Extremities:  no edema Psych:  normally interactive, good eye contact, not anxious appearing, and not depressed appearing.     Impression & Recommendations:  Problem # 1:  HYPERTENSION (ICD-401.9) Assessment Unchanged good control no changes needed  His updated medication list for this problem includes:    Atenolol 25 Mg Tabs (Atenolol) .Marland Kitchen... Take one by mouth daily  BP today: 136/78 Prior BP: 130/80 (11/10/2009)  Labs Reviewed: K+: 4.3 (08/09/2009) Creat: : 0.8 (08/09/2009)   Chol: 160 (08/09/2009)   HDL: 70.30 (08/09/2009)   LDL: 75 (08/09/2009)   TG: 72.0 (08/09/2009)  Problem # 2:  BACK PAIN, CHRONIC (ICD-724.5) Assessment: Unchanged does okay with three times a day hydrocodone  His updated medication list for this problem includes:    Hydrocodone-acetaminophen 5-325 Mg Tabs (Hydrocodone-acetaminophen) .Marland Kitchen... Take 1 by mouth three times a day as needed  Problem # 3:  GERD (ICD-530.81) Assessment: Unchanged doing well on med  His updated medication list for this problem includes:    Prilosec 40 Mg Cpdr (Omeprazole) .Marland Kitchen... Take 1 by mouth every am  Complete Medication List: 1)  Atenolol 25 Mg Tabs (Atenolol) .... Take one by mouth daily 2)  Prilosec 40 Mg Cpdr (Omeprazole) .... Take 1 by mouth every am 3)  Mirtazapine 45 Mg Tabs (Mirtazapine) .... Take one by mouth at bedtime 4)  Viagra 50 Mg Tabs (Sildenafil citrate) .... Take one by mouth as directed 5)  Hydrocodone-acetaminophen 5-325 Mg Tabs (Hydrocodone-acetaminophen)  .... Take 1 by mouth three times a day as needed  Patient Instructions: 1)  Please schedule a follow-up appointment in 6 months for physical  Current Allergies (reviewed today): No known allergies

## 2010-08-14 NOTE — Progress Notes (Signed)
Summary: Hydrocodone/APAP  Phone Note Refill Request Message from:  Fax from Pharmacy on January 01, 2010 11:53 AM  Refills Requested: Medication #1:  HYDROCODONE-ACETAMINOPHEN 5-325 MG TABS take 1 by mouth three times a day as needed [facility]  Phone:   [phone]   Method Requested: Telephone to Pharmacy Initial call taken by: Delilah Shan CMA Duncan Dull),  January 01, 2010 11:53 AM  Follow-up for Phone Call        okay #90 x 0 Follow-up by: Cindee Salt MD,  January 01, 2010 1:13 PM  Additional Follow-up for Phone Call Additional follow up Details #1::        Rx called to pharmacy Additional Follow-up by: DeShannon Smith CMA Duncan Dull),  January 01, 2010 3:20 PM    Prescriptions: HYDROCODONE-ACETAMINOPHEN 5-325 MG TABS (HYDROCODONE-ACETAMINOPHEN) take 1 by mouth three times a day as needed  #90 x 0   Entered by:   Mervin Hack CMA (AAMA)   Authorized by:   Cindee Salt MD   Signed by:   Mervin Hack CMA (AAMA) on 01/01/2010   Method used:   Telephoned to ...       [facility]* (retail)       [address]       [place], [place]  [address]       Ph: [phone]       Fax: [phone]   RxID:   [id number]

## 2010-08-14 NOTE — Progress Notes (Signed)
Summary: hydrocodone   Phone Note Refill Request Message from:  Patient on March 05, 2010 10:43 AM  Refills Requested: Medication #1:  HYDROCODONE-ACETAMINOPHEN 5-325 MG TABS take 1 by mouth three times a day as needed.   Last Refilled: 02/05/2010 Refill request from [place]. Fax is on your desk.   Initial call taken by: Melody Comas,  March 05, 2010 10:44 AM  Follow-up for Phone Call        Okay #90 x 0 Follow-up by: Cindee Salt MD,  March 05, 2010 1:26 PM  Additional Follow-up for Phone Call Additional follow up Details #1::        Rx faxed to pharmacy Additional Follow-up by: DeShannon Smith CMA Duncan Dull),  March 05, 2010 2:31 PM    Prescriptions: HYDROCODONE-ACETAMINOPHEN 5-325 MG TABS (HYDROCODONE-ACETAMINOPHEN) take 1 by mouth three times a day as needed  #90 x 0   Entered by:   Mervin Hack CMA (AAMA)   Authorized by:   Cindee Salt MD   Signed by:   Mervin Hack CMA (AAMA) on 03/05/2010   Method used:   Handwritten   RxID:   [id number]

## 2010-08-14 NOTE — Assessment & Plan Note (Signed)
Summary: CPX   Vital Signs:  Patient profile:   65 year old male Weight:      193 pounds Temp:     98.5 degrees F oral Pulse rate:   74 / minute Pulse rhythm:   regular BP sitting:   122 / 72  (left arm) Cuff size:   large  Vitals Entered By: Mervin Hack CMA Duncan Dull) (August 09, 2009 8:36 AM) CC: adult physical   History of Present Illness: doing okay in general  Has noted some dark stool in the past few days feels fine did have a lot of spinach and other greens Black and loose but not tarry. Green when wiping  Chronic back pain hydrocodone not as good as darvocet --may need 2 when pain is bad didn't do well with tramadol  Still smokes has been able to quit with patch but then relapses  Allergies: No Known Drug Allergies  Past History:  Past medical, surgical, family and social histories (including risk factors) reviewed for relevance to current acute and chronic problems.  Past Medical History: Reviewed history from 08/03/2008 and no changes required. 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 Varicoele repair 8295'A Right  knee arthrosopic surgery--2008 Left knee arthroscopy--2008  Family History: Reviewed history from 10/12/2007 and no changes required. Mom died @84  of stroke, had DM Dad died @56  killed in robbery 2 brother, 1 sister No CAD, HTN No prostate or colon cancer  Social History: Married--2nd  1 son (adopted) from previous marriage Current Smoker: only some at night. 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 Eyes:  Denies  double vision and vision loss-1 eye. ENT:  Denies decreased hearing and ringing in ears; chronic teeth trouble--regular with dentist. CV:  Denies chest pain or discomfort, difficulty breathing at night, difficulty breathing while lying down, fainting, lightheadness, palpitations, and shortness of breath with exertion. Resp:  Complains of cough; denies shortness of breath; occ AM cough. GI:  Complains of abdominal pain; denies bloody stools, change in bowel habits, indigestion, nausea, and vomiting; slight right sided abd sensation-- "a quirky thing" No heartburn on prilosec. GU:  Complains of erectile dysfunction; denies urinary frequency and urinary hesitancy; occ uses viagra. MS:  Complains of low back pain; denies joint pain and joint swelling. Derm:  Denies lesion(s) and rash. Neuro:  Denies headaches, numbness, tingling, and weakness. Psych:  Denies anxiety and depression; mood good on mirtazapine. Heme:  Denies abnormal bruising and enlarge lymph nodes. Allergy:  Denies seasonal allergies and sneezing.  Physical Exam  General:  alert and normal appearance.   Eyes:  pupils equal, pupils round, pupils reactive to light, and no optic disk abnormalities.   Ears:  R ear normal and L ear normal.   Mouth:  no erythema and no lesions.   Neck:  supple, no masses, no thyromegaly, no carotid bruits, and no cervical lymphadenopathy.   Lungs:  normal respiratory effort and normal breath sounds.   Heart:  normal rate, regular rhythm, no murmur, and no gallop.   Abdomen:  soft and non-tender.   Rectal:  no hemorrhoids and no masses.   Prostate:  no gland enlargement and no nodules.  Msk:  no joint tenderness and no joint swelling.   Pulses:  1+ in feet Extremities:  no edema Neurologic:  alert & oriented X3, strength normal in all extremities, and gait normal.   Skin:  no rashes and no suspicious lesions.   Axillary Nodes:  No palpable lymphadenopathy Psych:  normally interactive, good eye  contact, not anxious appearing, and not depressed appearing.     Impression & Recommendations:  Problem # 1:  PREVENTIVE HEALTH CARE (ICD-V70.0) Assessment Comment Only  doing well counselling well urged to stop smoking again--will get patch and then I asked him to use gum or lozenge for urges  Orders: TLB-Lipid Panel (80061-LIPID)  Problem # 2:  DEGENERATIVE DISC DISEASE, LUMBAR SPINE (ICD-722.52) Assessment: Unchanged does okay  vicodin as needed   Problem # 3:  HYPERTENSION (ICD-401.9) Assessment: Unchanged  good control no change needed  His updated medication list for this problem includes:    Atenolol 25 Mg Tabs (Atenolol) .Marland Kitchen... Take one by mouth daily  BP today: 122/72 Prior BP: 140/80 (02/08/2009)  Labs Reviewed: K+: 5.1 (08/03/2008) Creat: : 0.9 (08/03/2008)   Chol: 203 (07/23/2007)   HDL: 38.6 (07/23/2007)   LDL: DEL (07/23/2007)   TG: 176 (07/23/2007)  Orders: TLB-Renal Function Panel (80069-RENAL) TLB-CBC Platelet - w/Differential (85025-CBCD) TLB-Hepatic/Liver Function Pnl (80076-HEPATIC) TLB-TSH (Thyroid Stimulating Hormone) (84443-TSH) Venipuncture (13244)  Problem # 4:  SLEEP DISORDER (ICD-780.50) Assessment: Unchanged sleeps well and mood stable on mirtazapine  Complete Medication List: 1)  Atenolol 25 Mg Tabs (Atenolol) .... Take one by mouth daily 2)  Prilosec 40 Mg Cpdr (Omeprazole) .... Take 1 by mouth every am 3)  Mirtazapine 45 Mg Tabs (Mirtazapine) .... Take one by mouth at bedtime 4)  Viagra 50 Mg Tabs (Sildenafil citrate) .... Take one by mouth as directed 5)  Hydrocodone-acetaminophen 5-325 Mg Tabs (Hydrocodone-acetaminophen) .... Take 1 by mouth three times a day as needed  Other Orders: TLB-PSA (Prostate Specific Antigen) (84153-PSA) Flu Vaccine 19yrs + (01027) Admin 1st Vaccine (25366) Admin 1st Vaccine [facility]) [phone])  Patient Instructions: 1)  Please schedule a follow-up appointment in 6 months .  Prescriptions: VIAGRA 50  MG TABS (SILDENAFIL CITRATE) Take one by mouth as directed  #10 x 5   Entered and Authorized by:   Cindee Salt MD   Signed by:   Cindee Salt MD on 08/09/2009   Method used:   Electronically to        [facility]* (retail)       [address]       [place], [place]  [address]       Ph: [phone]       Fax: [phone]   RxID:   [id number] MIRTAZAPINE 45 MG TABS (MIRTAZAPINE) Take one by mouth at bedtime  #90 x 3   Entered and Authorized by:   Cindee Salt MD   Signed by:   Cindee Salt MD on 08/09/2009   Method used:   [facility]        [facility]ail[facility]       [address]       [place], [place]  [address]       Ph: [phone] or [phone]       Fax: [phone]   RxID:   [id number]   Current Allergies (reviewed today): No known allergies    Influenza Vaccine    Vaccine Type: Fluvax 3+    Site: left deltoid    Mfr:  GlaxoSmithKline    Dose: 0.5 ml    Route: IM    Given by: Mervin Hack CMA (AAMA)    Exp. Date: 01/11/2010    Lot #: [id number]    VIS given: 02/05/07 version given August 09, 2009.  Flu Vaccine Consent Questions    Do you have a history of severe allergic reactions to this vaccine? no    Any prior history of allergic reactions to egg and/or gelatin? no    Do you have a sensitivity to the preservative Thimersol? no    Do you have a past history of Guillan-Barre Syndrome? no    Do you currently have an acute febrile illness? no    Have you ever had a severe reaction to latex? no    Vaccine information given and explained to patient? 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

## 2010-08-14 NOTE — Consult Note (Signed)
Summary: [facility]  [facility]   Imported By: Lanelle Bal 10/06/2009 12:29:24  _____________________________________________________________________  External Attachment:    Type:   Image     Comment:   External Document  Appended Document: [facility] epididymitis Rx with cipro reactive hydroceles

## 2010-08-16 NOTE — Assessment & Plan Note (Signed)
Summary: CPX/JRR   Vital Signs:  Patient profile:   65 year old male Weight:      184 pounds Temp:     98.6 degrees F oral Pulse rate:   73 / minute Pulse rhythm:   regular BP sitting:   132 / 76  (left arm) Cuff size:   large  Vitals Entered By: Mervin Hack CMA Duncan Dull) (July 27, 2010 9:40 AM) CC: adult physical   History of Present Illness: Doing fairly well no new concerns  Trying to work out now since new year's walking, light weights, hopes to get back to the [employer] issues with boss left job  now retired but will look for some work  Heartburn generally controlled occ needs second dose about twice a week and this helps  Still needs the hydrocodone for his back Generally needs 2 per day Okay to take some extra tylenol as needed   Preventive Screening-Counseling & Management  Alcohol-Tobacco     Smoking Status: quit < 6 months     Tobacco Counseling: to remain off tobacco products  Allergies: No Known Drug Allergies  Past History:  Past medical, surgical, family and social histories (including risk factors) reviewed for relevance to current acute and chronic problems.  Past Medical History: Reviewed history from 08/03/2008 and no changes required. 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 Varicoele repair 1610'R Right  knee arthrosopic surgery--2008 Left knee arthroscopy--2008  Family History: Reviewed history from 10/12/2007 and no changes required. Mom died @84  of stroke, had DM Dad died @56  killed in robbery 2 brother, 1 sister No CAD, HTN No prostate or colon cancer  Social History: Married--2nd  1 son (adopted) from previous marriage Has just stopped smoking. Using nicorette gum Retired Clinical research associate mostly. Still looking for some work thoughl Alcohol  use-occ Smoking Status:  quit < 6 months  Review of Systems General:  weight stable Still intermittent sleep problems (like once a week) despite the meds wears seat belt. Eyes:  Denies double vision and vision loss-1 eye. ENT:  Complains of ringing in ears; denies decreased hearing; occ minor tiinitus teeth fine--regular with dentist. CV:  Denies chest pain or discomfort, difficulty breathing at night, difficulty breathing while lying down, fainting, lightheadness, palpitations, and shortness of breath with exertion. Resp:  Complains of cough; denies shortness of breath; some increased cough since stopped smoking---AM mucus. GI:  Complains of indigestion; denies abdominal pain, bloody stools, change in bowel habits, dark tarry stools, nausea, and vomiting. GU:  Complains of nocturia; denies urinary frequency and urinary hesitancy; rare nocturia Still with scrotal swelling but not inflammed. MS:  Complains of low back pain; denies joint pain and joint swelling. Derm:  Denies lesion(s) and rash. Neuro:  Denies headaches, numbness, tingling, and weakness. Psych:  Denies anxiety and depression. Heme:  Denies abnormal bruising and enlarge lymph nodes. Allergy:  Denies seasonal allergies and sneezing.  Physical Exam  General:  alert and normal appearance.   Eyes:  pupils round, pupils reactive to light, and no optic disk abnormalities.  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   Mouth:  no erythema, no exudates, and no lesions.   Neck:  supple, no masses, no thyromegaly, no carotid bruits, and no cervical lymphadenopathy.   Lungs:  normal respiratory effort, no intercostal retractions, no accessory muscle use,  and normal breath sounds.   Heart:  normal rate, regular rhythm, no murmur, and no gallop.   Abdomen:  soft, non-tender, and no masses.   Rectal:  deferred after discussion Msk:  no joint tenderness and no joint swelling.   Pulses:  1+ in  feet Extremities:  no edema Neurologic:  alert & oriented X3, strength normal in all extremities, and gait normal.   Skin:  no rashes and no suspicious lesions.   Axillary Nodes:  No palpable lymphadenopathy Psych:  normally interactive, good eye contact, not anxious appearing, and not depressed appearing.     Impression & Recommendations:  Problem # 1:  PREVENTIVE HEALTH CARE (ICD-V70.0) Assessment Comment Only up to date on colon will defer PSA for now (low last year)--will reconsider next year working on fitness  Problem # 2:  HYPERTENSION (ICD-401.9) Assessment: Unchanged  okay on meds no changes  His updated medication list for this problem includes:    Atenolol 25 Mg Tabs (Atenolol) .Marland Kitchen... Take one by mouth daily  BP today: 132/76 Prior BP: 136/78 (02/07/2010)  Labs Reviewed: K+: 4.3 (08/09/2009) Creat: : 0.8 (08/09/2009)   Chol: 160 (08/09/2009)   HDL: 70.30 (08/09/2009)   LDL: 75 (08/09/2009)   TG: 72.0 (08/09/2009)  Orders: TLB-Renal Function Panel (80069-RENAL) TLB-CBC Platelet - w/Differential (85025-CBCD) TLB-Hepatic/Liver Function Pnl (80076-HEPATIC) TLB-TSH (Thyroid Stimulating Hormone) (84443-TSH) Venipuncture (04540)  Problem # 3:  SLEEP DISORDER (ICD-780.50) Assessment: Unchanged generally okay with the mirtazapine  Problem # 4:  BACK PAIN, CHRONIC (ICD-724.5) Assessment: Unchanged uses the med up to three times a day  His updated medication list for this problem includes:    Hydrocodone-acetaminophen 5-325 Mg Tabs (Hydrocodone-acetaminophen) .Marland Kitchen... Take 1 by mouth three times a day as needed  Problem # 5:  GERD (ICD-530.81) Assessment: Unchanged occ uses extra OTC omeprazole Advised him to try ranitidine instead  His updated medication list for this problem includes:    Prilosec 40 Mg Cpdr (Omeprazole) .Marland Kitchen... Take 1 by mouth every am  Complete Medication List: 1)  Atenolol 25 Mg Tabs (Atenolol) .... Take one by mouth daily 2)  Prilosec 40 Mg  Cpdr (Omeprazole) .... Take 1 by mouth every am 3)  Mirtazapine 45 Mg Tabs (Mirtazapine) .... Take one by mouth at bedtime 4)  Viagra 50 Mg Tabs (Sildenafil citrate) .... Take one by mouth as directed 5)  Hydrocodone-acetaminophen 5-325 Mg Tabs (Hydrocodone-acetaminophen) .... Take 1 by mouth three times a day as needed  Other Orders: Flu Vaccine 13yrs + (98119) Admin 1st Vaccine (14782)  Patient Instructions: 1)  Please schedule a follow-up appointment in 6 months .  Prescriptions: VIAGRA 50 MG TABS (SILDENAFIL CITRATE) Take one by mouth as directed  #10 x 5   Entered by:   Mervin Hack CMA (AAMA)   Authorized by:   Cindee Salt MD   Signed by:   Mervin Hack CMA (AAMA) on 07/27/2010   Method used:   [facility]        [facility] [facility]       [address]       [place], [place]  [address]       Ph: [phone] or [phone]       Fax: [phone]   RxID:   [id number] MIRTAZAPINE 45 MG TABS (MIRTAZAPINE) Take one by mouth at bedtime  #90 x 3   Entered by:   Mervin Hack CMA (AAMA)   Authorized by:   Cindee Salt MD   Signed  by:   Mervin Hack CMA (AAMA) on 07/27/2010   Method used:   [facility]        [facility] [facility]       [address]       [place], [place]  [address]       Ph: [phone] or [phone]       Fax: [phone]   RxID:   [id number] PRILOSEC 40 MG CPDR (OMEPRAZOLE) Take 1 by mouth every am  #90 Capsule x 3   Entered by:   Mervin Hack CMA (AAMA)   Authorized by:   Cindee Salt MD   Signed by:   Mervin Hack CMA (AAMA) on 07/27/2010   Method used:   [facility]        [facility] [facility]       [address]       [place], [place]  [address]       Ph: [phone] or [phone]       Fax: [phone]   RxID:   [id number] ATENOLOL 25 MG TABS (ATENOLOL) Take one by mouth daily  #90 Tablet x 3   Entered by:   Mervin Hack  CMA (AAMA)   Authorized by:   Cindee Salt MD   Signed by:   Mervin Hack CMA (AAMA) on 07/27/2010   Method used:   [facility]        [facility] [facility]       [address]       [place], [place]  [address]       Ph: [phone] or [phone]       Fax: [phone]   RxID:   [id number]    Orders Added: 1)  Flu Vaccine 41yrs + [01751] 2)  Admin 1st Vaccine [90471] 3)  Est. Patient 40-64 years [99396] 4)  TLB-Renal Function Panel [80069-RENAL] 5)  TLB-CBC Platelet - w/Differential [85025-CBCD] 6)  TLB-Hepatic/Liver Function Pnl [80076-HEPATIC] 7)  TLB-TSH (Thyroid Stimulating Hormone) [84443-TSH] 8)  Venipuncture [02585]   Immunizations Administered:  Influenza Vaccine # 1:    Vaccine Type: Fluvax 3+    Site: right deltoid    Mfr: GlaxoSmithKline    Dose: 0.5 ml    Route: IM    Given by: Mervin Hack CMA (AAMA)    Exp. Date: 01/12/2011    Lot #: [id number]    VIS given: 02/06/10 version given July 27, 2010.  Flu Vaccine Consent Questions:    Do you have a history of severe allergic reactions to this vaccine? no    Any prior history of allergic reactions to egg and/or gelatin? no    Do you have a sensitivity to the preservative Thimersol? no    Do you have a past history of Guillan-Barre Syndrome? no    Do you currently have an acute febrile illness? no    Have you ever had a severe reaction to latex? no    Vaccine information given and explained to patient? yes   Immunizations Administered:  Influenza Vaccine # 1:    Vaccine Type: Fluvax 3+    Site: right deltoid    Mfr: GlaxoSmithKline    Dose: 0.5 ml    Route: IM    Given by: Mervin Hack CMA (AAMA)    Exp. Date: 01/12/2011    Lot #: [id number]    VIS given: 02/06/10 version given July 27, 2010.  Current Allergies (reviewed today): No known allergies

## 2010-08-16 NOTE — Progress Notes (Signed)
Summary: refill request for vicodin  Phone Note Refill Request Call back at [phone] Message from:  Patient  Refills Requested: Medication #1:  HYDROCODONE-ACETAMINOPHEN 5-325 MG TABS take 1 by mouth three times a day as needed. Phoned request from pt, please send to [facility].  Initial call taken by: Lowella Petties CMA, AAMA,  July 31, 2010 10:30 AM  Follow-up for Phone Call        okay #90 x 1 Follow-up by: Cindee Salt MD,  July 31, 2010 1:51 PM  Additional Follow-up for Phone Call Additional follow up Details #1::        Rx called to pharmacy Additional Follow-up by: DeShannon Smith CMA Duncan Dull),  July 31, 2010 4:30 PM    Prescriptions: HYDROCODONE-ACETAMINOPHEN 5-325 MG TABS (HYDROCODONE-ACETAMINOPHEN) take 1 by mouth three times a day as needed  #90 x 1   Entered by:   Mervin Hack CMA (AAMA)   Authorized by:   Cindee Salt MD   Signed by:   Mervin Hack CMA (AAMA) on 07/31/2010   Method used:   Telephoned to ...       [facility]* (retail)       [address]       [place], [place]  [address]       Ph: [phone]       Fax: [phone]   RxID:   [id number]

## 2010-08-17 ENCOUNTER — Ambulatory Visit: Admit: 2010-08-17 | Payer: Self-pay | Admitting: Internal Medicine

## 2010-08-17 ENCOUNTER — Encounter: Payer: Self-pay | Admitting: Internal Medicine

## 2010-08-17 ENCOUNTER — Other Ambulatory Visit (INDEPENDENT_AMBULATORY_CARE_PROVIDER_SITE_OTHER): Payer: BC Managed Care – PPO

## 2010-08-17 ENCOUNTER — Encounter (INDEPENDENT_AMBULATORY_CARE_PROVIDER_SITE_OTHER): Payer: Self-pay | Admitting: *Deleted

## 2010-08-17 ENCOUNTER — Other Ambulatory Visit: Payer: Self-pay | Admitting: Internal Medicine

## 2010-08-17 DIAGNOSIS — R740 Nonspecific elevation of levels of transaminase and lactic acid dehydrogenase [LDH]: Secondary | ICD-10-CM

## 2010-08-17 LAB — HEPATIC FUNCTION PANEL
ALT: 29 U/L (ref 0–53)
AST: 48 U/L — ABNORMAL HIGH (ref 0–37)
Bilirubin, Direct: 0.4 mg/dL — ABNORMAL HIGH (ref 0.0–0.3)
Total Protein: 6.9 g/dL (ref 6.0–8.3)

## 2010-08-20 LAB — CONVERTED CEMR LAB
HCV Ab: NEGATIVE
Hep A IgM: NEGATIVE
Hepatitis B Surface Ag: NEGATIVE

## 2010-08-25 ENCOUNTER — Emergency Department (HOSPITAL_COMMUNITY): Payer: BC Managed Care – PPO

## 2010-08-25 ENCOUNTER — Emergency Department (HOSPITAL_COMMUNITY)
Admission: EM | Admit: 2010-08-25 | Discharge: 2010-08-25 | Disposition: A | Discharge status: Home / Self Care | Payer: BC Managed Care – PPO | Attending: Emergency Medicine | Admitting: Emergency Medicine

## 2010-08-25 DIAGNOSIS — R1013 Epigastric pain: Secondary | ICD-10-CM | POA: Insufficient documentation

## 2010-08-25 DIAGNOSIS — I10 Essential (primary) hypertension: Secondary | ICD-10-CM | POA: Insufficient documentation

## 2010-08-25 DIAGNOSIS — R0602 Shortness of breath: Secondary | ICD-10-CM | POA: Insufficient documentation

## 2010-08-25 DIAGNOSIS — R112 Nausea with vomiting, unspecified: Secondary | ICD-10-CM | POA: Insufficient documentation

## 2010-08-25 LAB — URINALYSIS, ROUTINE W REFLEX MICROSCOPIC
Nitrite: NEGATIVE
Protein, ur: NEGATIVE mg/dL
Urine Glucose, Fasting: NEGATIVE mg/dL

## 2010-08-25 LAB — CBC
MCH: 34.2 pg — ABNORMAL HIGH (ref 26.0–34.0)
MCHC: 35.6 g/dL (ref 30.0–36.0)
Platelets: 127 10*3/uL — ABNORMAL LOW (ref 150–400)
RBC: 5 MIL/uL (ref 4.22–5.81)
WBC: 6.5 10*3/uL (ref 4.0–10.5)

## 2010-08-25 LAB — DIFFERENTIAL
Basophils Relative: 0 % (ref 0–1)
Eosinophils Absolute: 0 10*3/uL (ref 0.0–0.7)
Eosinophils Relative: 0 % (ref 0–5)
Lymphocytes Relative: 12 % (ref 12–46)
Monocytes Absolute: 0.4 10*3/uL (ref 0.1–1.0)
Monocytes Relative: 6 % (ref 3–12)

## 2010-08-25 LAB — COMPREHENSIVE METABOLIC PANEL
ALT: 32 U/L (ref 0–53)
AST: 55 U/L — ABNORMAL HIGH (ref 0–37)
Albumin: 4.1 g/dL (ref 3.5–5.2)
BUN: 7 mg/dL (ref 6–23)
Calcium: 9.7 mg/dL (ref 8.4–10.5)
Chloride: 101 mEq/L (ref 96–112)
Creatinine, Ser: 0.68 mg/dL (ref 0.4–1.5)
GFR calc non Af Amer: >60 mL/min (ref >60)
Total Protein: 8.1 g/dL (ref 6.0–8.3)

## 2010-08-25 LAB — OCCULT BLOOD, POC DEVICE: Fecal Occult Bld: NEGATIVE

## 2010-09-24 ENCOUNTER — Telehealth: Payer: Self-pay | Admitting: Internal Medicine

## 2010-09-27 ENCOUNTER — Telehealth: Payer: Self-pay | Admitting: Internal Medicine

## 2010-10-02 NOTE — Progress Notes (Signed)
Summary: refill request for mirtazapine  Phone Note Refill Request Message from:  Fax from Pharmacy  Refills Requested: Medication #1:  MIRTAZAPINE 45 MG TABS Take one by mouth at bedtime [facility] [address] [facility] on your desk.  Initial call taken by: Lowella Petties CMA, AAMA,  September 24, 2010 4:42 PM  Follow-up for Phone Call        Rx completed in Dr. Tiajuana Amass Follow-up by: Cindee Salt MD,  September 24, 2010 5:45 PM    Prescriptions: MIRTAZAPINE 45 MG TABS (MIRTAZAPINE) Take one by mouth at bedtime  #30 x 11   Entered and Authorized by:   Cindee Salt MD   Signed by:   Cindee Salt MD on 09/24/2010   Method used:   [facility]        [facility] [facility]ail[facility]       [address]       [place], [place]  [address]       Ph: [phone] or [phone]       Fax: [phone]   RxID:   [id number]

## 2010-10-02 NOTE — Progress Notes (Signed)
Summary: hydrocodone   Phone Note Refill Request Message from:  Fax from Pharmacy on September 27, 2010 4:03 PM  Refills Requested: Medication #1:  HYDROCODONE-ACETAMINOPHEN 5-325 MG TABS take 1 by mouth three times a day as needed.   Last Refilled: 08/30/2010 Refill request from [facility]. [phone]. Fax is on your desk.   Initial call taken by: Melody Comas,  September 27, 2010 4:04 PM  Follow-up for Phone Call        okay #90 x 0 Follow-up by: Cindee Salt MD,  September 28, 2010 7:38 AM  Additional Follow-up for Phone Call Additional follow up Details #1::        Rx faxed to pharmacy Additional Follow-up by: DeShannon Smith CMA Duncan Dull),  September 28, 2010 8:46 AM    Prescriptions: HYDROCODONE-ACETAMINOPHEN 5-325 MG TABS (HYDROCODONE-ACETAMINOPHEN) take 1 by mouth three times a day as needed  #90 x 0   Entered by:   Mervin Hack CMA (AAMA)   Authorized by:   Cindee Salt MD   Signed by:   Mervin Hack CMA (AAMA) on 09/28/2010   Method used:   Handwritten   RxID:   [id number]

## 2010-10-03 LAB — CBC
Hemoglobin: 14.4 g/dL (ref 13.0–17.0)
MCHC: 33.9 g/dL (ref 30.0–36.0)
Platelets: 148 10*3/uL — ABNORMAL LOW (ref 150–400)
RBC: 4.34 MIL/uL (ref 4.22–5.81)
WBC: 7.6 10*3/uL (ref 4.0–10.5)

## 2010-10-03 LAB — BASIC METABOLIC PANEL
CO2: 31 mEq/L (ref 19–32)
Calcium: 8.8 mg/dL (ref 8.4–10.5)
Glucose, Bld: 97 mg/dL (ref 70–99)
Potassium: 4 mEq/L (ref 3.5–5.1)

## 2010-10-05 LAB — URINALYSIS, ROUTINE W REFLEX MICROSCOPIC
Glucose, UA: NEGATIVE mg/dL
Ketones, ur: 15 mg/dL — AB
Nitrite: NEGATIVE

## 2010-10-29 ENCOUNTER — Other Ambulatory Visit: Payer: Self-pay | Admitting: *Deleted

## 2010-10-29 MED ORDER — HYDROCODONE-ACETAMINOPHEN 5-325 MG PO TABS: ORAL_TABLET | ORAL | Status: DC

## 2010-10-29 NOTE — Telephone Encounter (Signed)
Okay #90 x 0 

## 2010-10-29 NOTE — Telephone Encounter (Signed)
rx faxed to pharmacy

## 2010-11-27 ENCOUNTER — Other Ambulatory Visit: Payer: Self-pay | Admitting: *Deleted

## 2010-11-27 MED ORDER — OMEPRAZOLE 40 MG PO CPDR: 40.0000 mg | DELAYED_RELEASE_CAPSULE | Freq: Every day | ORAL | Status: DC

## 2010-11-28 ENCOUNTER — Other Ambulatory Visit: Payer: Self-pay | Admitting: *Deleted

## 2010-11-28 MED ORDER — HYDROCODONE-ACETAMINOPHEN 5-325 MG PO TABS: ORAL_TABLET | ORAL | Status: DC

## 2010-11-28 NOTE — Telephone Encounter (Signed)
Okay #90 x 0 

## 2010-11-28 NOTE — Telephone Encounter (Signed)
Fax is on your desk . 

## 2010-11-28 NOTE — Telephone Encounter (Signed)
rx faxed to pharmacy manually  

## 2010-11-30 NOTE — Op Note (Signed)
   NAME:  Billy [name], Billy [name]                       ACCOUNT NO.:  [id number]   MEDICAL RECORD NO.:  [id number]                   PATIENT TYPE:  AMB   LOCATION:  ENDO                                 FACILITY:  [facility]   PHYSICIAN:  Graylin Shiver, M.D.                DATE OF BIRTH:  [birth year]   DATE OF PROCEDURE:  04/13/2003  DATE OF DISCHARGE:                                 OPERATIVE REPORT   PROCEDURE:  Colonoscopy with biopsy.   ENDOSCOPIST:  Graylin Shiver, M.D.   INDICATION FOR PROCEDURE:  History of colon polyp.   Informed consent was obtained after explanation of the risks of bleeding,  infection and perforation.   PREOPERATIVE MEDICATIONS:  Fentanyl 70 mcg IV, Versed 7 mg IV.   PROCEDURE:  With the patient in the left lateral decubitus position, a  rectal exam was performed and no masses were felt.  The Olympus colonoscope  was inserted into the rectum and advanced around the colon to the cecum.  Cecal landmarks were identified.  At the base of the cecum, adjacent to the  appendiceal orifice, there was a small 3- to 4-mm polyp which was biopsied  off with cold forceps.  The rest of the cecum looked normal.  The ascending  colon looked normal; the transverse colon looked normal; the descending  colon, sigmoid and rectum looked normal.  He tolerated the procedure well  without complications.   IMPRESSION:  Small cecal polyp.   PLAN:  The pathology will be checked.                                               Graylin Shiver, M.D.    SFG/MEDQ  D:  04/13/2003  T:  04/13/2003  Job:  [id number]   cc:   Thelma Barge P. Modesto Charon, M.D.  [address]  [place]  [place] [address]  Fax: [phone]

## 2010-11-30 NOTE — Assessment & Plan Note (Signed)
[facility]                        [place] OFFICE NOTE   ADIT, RIDDLES                      MRN:          [id number]  DATE:09/10/2006                            DOB:          1945/11/25    I spoke to Mr. Silliman today after he declined seeing cardiology for  surgical clearance.  The patient reports that he is scheduled to have  surgery today and he does not want to postpone it.  I discussed with him  that he does have a 20% risk of a cardiac event based on the Framingham  Study over the next 10 years.  The patient states that he understands  this but he wants to continue with the surgery.  The patient relayed to  me that the clearance was expressed to him in passing at his first  visit with orthopedics, and subsequently he has had the okay per  patient.  I did advise the patient since I was involved from the  beginning based on the initial visit from him regarding surgical  clearance, I would advise Dr. Ranell Patrick or his office of my discussion with  Mr. Choi and why I recommended cardiology referral.  The patient  expressed understanding. He states he would sign a waiver if necessary.Leanne Chang, M.D.  Electronically Signed    LA/MedQ  DD: 09/10/2006  DT: 09/10/2006  Job #: [id number]

## 2010-11-30 NOTE — Discharge Summary (Signed)
NAME:  Billy [name], PIANKA NO.:  [id number]   MEDICAL RECORD NO.:  [id number]          PATIENT TYPE:  INP   LOCATION:  3705                         FACILITY:  [facility]   PHYSICIAN:  Jackie Plum, M.D.DATE OF BIRTH:  [birth year]   DATE OF ADMISSION:  05/26/2005  DATE OF DISCHARGE:  05/30/2005                                 DISCHARGE SUMMARY   DISCHARGE DIAGNOSIS:  1.  Gastrointestinal bleed presumed to be secondary to erosive esophagitis.      1.  Upper endoscopy done on May 27, 2005, showed 1+ esophageal          varices with minimal antral gastritis, status post CLOtest which          needs to be followed as an outpatient.  If positive, the patient          will need to be treated fully for Helicobacter pylori by primary          care physician.  In addition, there was grade 2 reflux esophagitis          with old blood covering erosions that were seen.  There was no          active bleeding.  The cause of this was thought to be secondary to          the patient taking non-steroidal anti-inflammatories for his back          pain.  As a substitution for this, the patient has been prescribed          Darvocet N100.  He is going to take Prilosec 20 mg increased dose of          twice daily for a total of 11 more days to make it three weeks          treatment on b.i.d. dose and then drop down to daily thereafter.  2.  Anemia of acute blood loss improved status post packed red blood cell      transfusions secondary to number 1.  3.  History of alcoholism, the patient is referred to AA, he is agreeable to      this.  4.  History of diverticulosis and hemorrhoids.  5.  History of hypertension (the patient's Atenolol has been held, first      because of GI bleed, and also it was not restarted because the patient      had a transient episode of bradycardia with heart block and he was      asymptomatic, this happened today and he is asked to hold this until he      sees  his primary care physician at which time the patient will be re-      evaluated and restarted as needed).  6.  History of adenomatous colon polyp.  7.  History of arthritis.  8.  Hypertension.   DISCHARGE MEDICATIONS:  The patient is going to resume his Remeron and stop  Naprosyn and Advil.  He is going to be on Darvocet N100, 1-2 tablets q.6h.  p.r.n. pain.  His Prilosec has been increased from 20 daily  to 20 twice  daily for 11 more days then once daily thereafter.   CONSULTATIONS:  Lina Sar, M.D. [facility]   PROCEDURES:  Upper endoscopy as noted above.   CONDITION ON DISCHARGE:  Improved and satisfactory.   REASON FOR ADMISSION:  Melena.  The patient presented with melena to the ED.  He had been vomiting dark blood, as well.  He had been feeling weak and  fatigued with dyspnea on exertion.  In the emergency room, the patient was  noted to be anemic with hemoglobin 6.7.  On admission, the patient's blood  pressure was 149/87 with a heart rate of 118 beats per minute as a peak  which came up to 122 thereafter.  He was pale but not in acute distress.  His abdominal exam revealed normal active bowel sounds without any  organomegaly.  He is, therefore, admitted for management of his upper GI  bleed with severe anemia.   HOSPITAL COURSE:  The patient was admitted to the hospital on a telemetry  bed.  There were no significant arrhythmias.  He received packed red blood  cell blood transfusions.  His antihypertensive medications were held.  He  had endoscopy done which indicated the above and the cause of his GI bleed  is erosive esophagitis due to NSAIDs that he has been taking.  There is no  active bleed and he is planned for PPI treatment.  Over the course of his  treatment, the patient's hemoglobin dropped and he had to be transfused.  He  was seen by Dr. Juanda Chance yesterday and indicated that she felt this was all  due to his hemoglobin status and indicated the patient was OK to go  today.  On rounds today, the patient is fine, denies any fever, no chills, no  abdominal pain, no melena, no hematemesis, no hematochezia.  He has been  ambulating the hospital without any fatigue, dyspnea on exertion, or any  other cardiopulmonary symptomatology.  He has not had any GI symptoms.  He  has been eating and drinking without any problems.  No abdominal pain, no  nausea and vomiting.  He is deemed appropriate for discharge today with  outpatient follow up.   DISCHARGE PHYSICAL EXAMINATION:  Blood pressure 130/84, radial pulse was 92,  temperature 98.5, saturations 99% on 2 liters of oxygen by nasal cannula.  He is alert and oriented.  He has mild scleral pallor with icterus.  Lungs  clear to auscultation.  Cardiac regular.  The patient was tachycardic, no  gallops.  Abdomen soft, full, nontender, bowel sounds present.  Extremities:  No cyanosis.  CNS:  Nonfocal.   DISCHARGE LABORATORY DATA:  Hemoglobin 10.1 and hematocrit 30.2.  Platelet  count 117 (this is deemed secondary to his alcoholism, this was the level  obtained on admission and it was not repeated).  He will need to get his  platelet count repeated as an outpatient by his primary care physician.  Sodium 142, potassium 3.5, chloride 114, CO2 20, glucose 111, BUN 21,  creatinine 0.9, bilirubin 0.5, alkaline phos 45, AST 28, ALT 17, total  protein 5.5.   Total time spent for evaluation of the patient today including the time  preparing this patient for discharge was more than 50 minutes.      Jackie Plum, M.D.  Electronically Signed     GO/MEDQ  D:  05/30/2005  T:  05/30/2005  Job:  [id number]   cc:   Thelma Barge P. Modesto Charon, M.D.  Fax: [phone]  Lina Sar, M.D. [facility]  [address]  [place]  [place] [address]

## 2010-11-30 NOTE — Consult Note (Signed)
NAME:  Billy [name], Billy [name]                       ACCOUNT NO.:  [id number]   MEDICAL RECORD NO.:  [id number]                   PATIENT TYPE:  INP   LOCATION:  3305                                 FACILITY:  [facility]   PHYSICIAN:  Griffith Citron, M.D.             DATE OF BIRTH:  [birth year]   DATE OF CONSULTATION:  10/18/2002  DATE OF DISCHARGE:                                   CONSULTATION   REFERRING PHYSICIAN:  Lazaro Arms, M.D.   REASON FOR CONSULTATION:  I was asked to see this patient, known to me from  prior elective colonoscopy, by Lazaro Arms, M.D., to assist with  the diagnosis and possible endoscopic treatment of acute upper GI  hemorrhage.   HISTORY OF PRESENT ILLNESS:  The patient was last seen by me at the time of  his colonoscopy for tubular adenoma in July of 2001.  He presented with  Hemoccult-positive stool.  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  The patient was drinking quite heavily at  that time and relayed a history of elevated liver enzymes dating back 10  years ago.  He had been evaluated in [place], but these records were  not available.   The patient was lost to follow-up.   He presents with an acute illness beginning 48 hours ago, initially with  melena.  This lasted for 48 hours and was followed by hematemesis of bright  red blood yesterday and today.  Mild weakness.  No orthostasis.  Denies  odynophagia, dysphagia, regurgitation, and pyrosis.  No abdominal pain.  Appetite is fair.  Melena has continued over the past 48 hours.   The patient continues to drink daily, though he says that it is much reduced  and now is limited to beer.  He has additional risk factors, including  p.r.n. Bextra for osteoarthritis and daily low-dose aspirin for cardiac  prophylaxis.  He has no prior history of upper GI bleed.  There is no family  history of GI hemorrhage.   PAST MEDICAL HISTORY:  1.  Tubular colon adenoma in July of 2001.  2. Alcohol use/abuse, acute/chronic.  3. Anxiety.  4. Arthritis.   CURRENT MEDICAL REGIMEN:  Neurontin, Remeron, and Bextra daily.   ALLERGIES:  No known drug allergies.   REVIEW OF SYSTEMS:  Appetite is good.  Weight has remained stable.  He  denies any excess bleeding or bruising.  No abdominal or peripheral  swelling.  No evidence of hematologic disorder.   SOCIAL/PERSONAL HISTORY:  The patient was born in [place], [place].  Raised in [place], [place].  Relocated to [place], [place],  in March of 2000.  Divorced twice.  Presently married to third wife.  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  Diet is  unrestricted.  Religion is Systems analyst.   FAMILY HISTORY:  Mother living at  age 44 with diabetes and manic depressive  disorder.  Father deceased at age 49.  A sister and two brothers living, the  young with depression.  One son, age 94, healthy.   PHYSICAL EXAMINATION:  GENERAL APPEARANCE:  Healthy appearing.  Alert and  oriented.  Appears unchanged.  VITAL SIGNS:  Stable.  HEENT:  Mild conjunctival pallor.  Anicteric sclerae.  No oropharyngeal  lesion with teeth, gums, lips, and tongue all normal.  NECK:  Supple.  No adenopathy, thyromegaly, or bruit.  Multiple spider  angioma around the base of the neck.  CHEST:  Clear to auscultation.  No gynecomastia.  ABDOMEN:  Soft, nondistended, and nontender.  There is no palpable  organomegaly.  No mass or firmness.  Bowel sounds are active throughout.  No  borborygmi or bruit.  Possible early fluid wave.  No caput organ distention.  RECTAL:  Not repeated.  EXTREMITIES:  No cyanosis, clubbing, or edema.   LABORATORY DATA:  Hematocrit 38.4, WBC 6600, platelets 124,000.  BMET:  Sodium 138, potassium 4.0, chloride 102, CO2 22, BUN 27, creatinine 0.9.  Pro time INR 1.1.   ASSESSMENT:  1. Acute upper gastrointestinal bleed suggested by symptoms of  melena     followed by hematemesis.  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  Alternative etiologies     include upper gastrointestinal malignancy, Mallory-Weiss tear,     arteriovenous malformation, and Dieulafoy lesion.  Endoscopy warranted     given the wide differential and rather brisk bleeding over the past 48     hours to further direct therapy and for possible therapeutic intervention     endoscopically.  2. Acute/chronic alcohol abuse.  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  3. High risk of delirium tremens.   RECOMMENDATIONS:  1. Proceed with panendoscopy.  2. IV Protonix 40 mg empirically pending endoscopy results.  3. Maintain NPO.  4.     Close monitoring of vital signs, hematemesis, and melena.  5. Close observation for possible DTs.  6. Hold aspirin and Bextra.                                               Griffith Citron, M.D.    Shawna Orleans  D:  10/18/2002  T:  10/19/2002  Job:  [id number]   cc:   Lazaro Arms, M.D.  [address] [place], [place] [address]  Fax: [phone]   Maryla Morrow. Modesto Charon, M.D.  [address]  [place]  [place] [address]  Fax: [phone]

## 2010-11-30 NOTE — Assessment & Plan Note (Signed)
[facility]                        [place] OFFICE NOTE   JERICO, GRISSO                    MRN:          [id number]  DATE:08/29/2006                            DOB:          [birth year]    REASON FOR VISIT:  Surgical clearance for arthroscopic surgery of the  right knee. Mr. Billy [name] is a 65 year old male who is scheduled in two  weeks for arthroscopic of the right knee to repair a complex tear of the  posterior horn of the meniscus.   HISTORY:  The patient has a history of hypertension, as well as being a  smoker i.e. one pack per day. The patient is otherwise in good health.  Blood pressure is normally well controlled on his medications. He denies  any chest pain, shortness of breath, or dyspnea on exertion. He  previously exercised regularly, prior to hurting his knee. He denies any  family history of premature heart disease or heart attack.   PAST MEDICAL HISTORY:  1. Hypertension, well controlled.  2. Gastroesophageal reflux disease.  3. Chronic pain.  4. Degenerative disc disease.  5. Depression.  6. History of base cell carcinoma.  7. History of a GI bleed with anemia.  8. History of diverticulosis.   PAST SURGICAL HISTORY:  Very costly repair of the testicles.   MEDICATIONS:  1. Atenolol 20 mg.  2. Darvocet A100 2 mg 2 tablets in the morning and 2 tablets in the      evening.  3. Prilosec 20 mg daily.  4. Remeron 45 mg daily.  5. Viagra 50 mg p.r.n.   ALLERGIES:  No known drug allergies.   FAMILY HISTORY:  Mother passed away at the age of 63. She did have type  2 diabetes, badly controlled. Father died at 77 from a gunshot wound. He  has 3 siblings who are alive and well.   SOCIAL HISTORY:  He is married and has an adopted son. He works as a  Production designer, theatre/television/film at [employer] and smokes one pack per day. The patient does not  drink alcohol, but has a previous history of alcohol abuse.   REVIEW OF SYSTEMS:  Completely  unremarkable, except for what was stated  in the history of present illness.   OBJECTIVE:  VITAL SIGNS: Weight 220, pulse 64, blood pressure 110/70.  GENERAL: This is a pleasant male in no acute distress, answers questions  appropriately, alert and oriented x3.  HEENT: Unremarkable.  NECK: Supple, no lymphadenopathy, carotid bruits or JVD.  LUNGS: Clear.  HEART: Regular rate and rhythm, normal S1, S2, no murmurs, gallops, or  rubs.  EXTREMITIES: No cyanosis, clubbing, or edema.   EKG:  Shows normal sinus rhythm with no ST elevations or depressions. No  Q waves, PVCs or PACs.  Compared to EKG from old records, shows no  significant changes. That EKG was done in May 2001.   IMPRESSION:  This is a 65 year old male scheduled to have a right  meniscal repaired two weeks from now. He has a history of hypertension  and is also a smoker. The patient is generally healthy with no  significant  abnormalities on exam and chronic medical problems are  stable. An EKG is unchanged compared to 2001 with no significant  findings.   PLAN:  1. We will obtain a chest x-ray, PA and lateral.  2. Obtain a CBC and comprehensive metabolic profile.  3. Further recommendations after review of the above. The patient      expressed understanding.     Leanne Chang, M.D.  Electronically Signed    LA/MedQ  DD: 08/31/2006  DT: 09/01/2006  Job #: [id number]

## 2010-11-30 NOTE — H&P (Signed)
NAME:  Billy [name], Billy [name] NO.:  [id number]   MEDICAL RECORD NO.:  [id number]          PATIENT TYPE:  INP   LOCATION:  1825                         FACILITY:  [facility]   PHYSICIAN:  Jackie Plum, M.D.DATE OF BIRTH:  1946/03/19   DATE OF ADMISSION:  05/26/2005  DATE OF DISCHARGE:                                HISTORY & PHYSICAL   PRIMARY CARE PHYSICIAN:  Dr. Modesto Charon.   CHIEF COMPLAINT:  Melena.   HISTORY OF PRESENT ILLNESS:  The patient is an 65 year old Caucasian  gentleman with previous history of what sounds to be dysphagia rupture,  clearly a Mallory-Weiss syndrome, who presented to the ED with the above  complaint. He has had melena and vomiting of dark, bloody vomitus today in  the morning. He had been feeling weak, fatigued, with dyspnea on exertion.  The patient came to the ED because of dizziness without any chest pain. He  denies abdominal pain, constipation or diarrhea but admits to nausea. The  patient denies any PND or orthopnea. No palpitations. No dysuria, frequency,  or micturition.   The patient was admitted very anemic with a hemoglobin of 6.7 .   PAST MEDICAL HISTORY:  1.  The patient in 2004 was admitted to the hospitalist service for apparent      GI bleed. At that time, Dr. 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 He also has history of adenomatous polyp for which the patient      had a colonoscopy in September of 2004 by Dr. Evette Cristal which revealed small      adenomatous cecal polyps.  2.  The patient has a history of hypertension.  3.  He has a history of anxiety.  4.  Diverticulosis.  5.  Hemorrhoids.   ALLERGIES:  The patient does not have any medication allergies.   MEDICATIONS:  He takes Atenolol, Remeron, and naproxen which was started  recently after tooth extraction with bone graft on May 02, 2005 by his  dentist. He also takes Prilosec and  multivitamins.   FAMILY HISTORY:  Negative for any GI or bowel complaints.   SOCIAL HISTORY:  The patient is married and lives with his wife and has one  child. He smokes one pack of cigarettes daily. He drinks several beers as  well as liquor.   REVIEW OF SYSTEMS:  Significant positive/negative are as noted above.  Otherwise unremarkable.   PHYSICAL EXAMINATION:  VITAL SIGNS:  Blood pressure was 149/37, temperature  was 99.2 degrees, heart rate peaked at 182 per minute in the ED but came  down to 122 at the time of my evaluation, respiratory rate was 20. O2  saturation by pulse oximeter was 100%.  GENERAL:  The patient was not in any acute pulmonary distress.  HEENT:  Normocephalic and atraumatic. Pupils are equal, round, and reactive  to light and accommodation. The patient had explicit sclerae icterus.  Oropharynx was moist. No discharge or erythema.  NECK:  Supple. No JVD.  LUNGS:  Clear to  auscultation.  CARDIOVASCULAR:  The patient was tachycardic. Rhythm was regular. No  gallops.  ABDOMEN:  Soft. Bowel sounds were present. I could not appreciate any  hepatosplenomegaly.  SKIN:  There were no stigmata of coronary artery disease.  EXTREMITIES:  No cyanosis and no edema.  CNS:  The patient is alert and appropriate. There is no adverse focal  deficit.   LABORATORY DATA:  I-stat indicated sodium 141, potassium 4.0, chloride 109,  CO2 not available. BUN was 30, glucose 108. A pH 7.432, pCO2 8.5,  bicarbonate 25.7. Hemoglobin 7.1, hematocrit 21.0. WBC 9.7, hemoglobin 6.7,  hematocrit 20.7, MCV 88.7, platelet count 152,000. PTT 27, PT 15.2. INR 1.2.  Total protein 5.3, albumin 2.8, AST 30, ALT 19, alkaline phosphate 42, total  bilirubin 0.9. X-ray showed  mild bibasilar atelectasis.   IMPRESSION:  1.  Upper gastrointestinal bleed.  2.  Anemia with acute blood loss.  3.  Smoking.  4.  Alcohol abuse.  5.  Hypertension.  6.  Anxiety.  7.  The patient had a 12-lead EKG which  indicated sinus tachycardia at 129      beats per minute, premature ventricular contractures.   PLAN:  The patient will be admitted to the hospitalist service to telemetry  bed. The patient will be scheduled for endoscopy tomorrow morning, n.p.o. He  will be resuscitated with IV fluids which will be d5. We will use folic acid  and thiamine because of his alcoholism. We will watch him for alcohol  withdrawal. He will be on Ativan protocol. His hemoglobin will be serially  obtained to follow hemoglobin level. We will transfuse him a total of three  liters of blood and follow his hemoglobin. The patient will be on proton  pump inhibitor. The patient is eating and drinking, presumably poor  endoscopy. He will be seen expeditiously by cessation team as well as social  worker/case management to assist him with any help regarding referral to  alcohol rehabilitation program. I will start support medications including  antiemetics/antinausea will be instituted. Full comprehensive metabolic  panel tomorrow will be obtained. During this time, his liver panel will be  reviewed.      Jackie Plum, M.D.  Electronically Signed     GO/MEDQ  D:  05/27/2005  T:  05/27/2005  Job:  [id number]   cc:   Maryla Morrow. Modesto Charon, M.D.  Fax: [phone]

## 2010-11-30 NOTE — Procedures (Signed)
[facility]  Patient:    Billy [name], Billy [name]                    MRN: [id number] Proc. Date: 01/29/00 Adm. Date:  [id number] Attending:  Deneen Harts CC:         Redmond Baseman, M.D.                           Procedure Report  PROCEDURE PERFORMED:  Colonoscopic polypectomy.  ENDOSCOPIST:  Griffith Citron, M.D.  INDICATIONS FOR PROCEDURE:  The patient is a 65 year old white male found on routine screening by Dr. Modesto Charon to have hemoccult positive stool.  No prior colorectal neoplasia surveillance.  No significant family history of colon cancer.  Patient is asymptomatic with normal bowel movements, no hematochezia, no change in stool caliber.  DESCRIPTION OF PROCEDURE:  After reviewing the nature of the procedure with the patient including potential risks and complications, and after discussing alternative methods of diagnosis and treatment, informed consent was signed.  The patient was premedicated receiving IV sedation administered in divided doses totaling Versed 10 mg, fentanyl 87.5 mcg and droperidol 2.5 mg IV. Using an Olympus pediatric PCF-140L video colonoscope, rectum was intubated after normal digital examination.  The scope was advanced easily around the entire length of the colon to the cecum, identified by the appendiceal orifice and ileocecal valve.  There was a small diminutive 5 mm polyp in the cecal base.  This was resected with hot biopsy forceps, recovered and submitted to pathology.  The scope was slowly withdrawn with careful inspection of the entire colon in a retrograde manner including a retroflex view in the rectal vault.  This revealed internal hemorrhoids, minimally inflamed.  No other abnormality was noted.  There was no additional neoplasia identified.  The mucosa was intact throughout.  No vascular abnormality.  There were two diverticula in the sigmoid colon which were noninflamed.  The colon was decompressed and  the scope withdrawn.  The patient tolerated the procedure without difficulty being maintained on Datascope monitor and low-flow oxygen throughout.  TIME:  1.  TECHNICAL:  2.  PREPARATION:  1.  TOTAL SCORE:  4.  ASSESSMENT: 1. Cecal polyp--diminutive, resected. 2. Diverticulosis--minimal, sigmoid. 3. Internal hemorrhoids--probable source of hemoccult positive stool.  RECOMMENDATIONS: 1. Postpolypectomy instructions were reviewed. 2. Follow-up pathology. 3. Colonoscopy in three years if adenoma. 4. Rectal care p.r.n. for hemorrhoids. 1. Postpolypectomy instructions reviewed. 2. Follow up pathology. 3. Repeat colonoscopy in five years for ongoing surveillance. DD:  01/29/00 TD:  01/30/00 Job: [id number]

## 2010-11-30 NOTE — Assessment & Plan Note (Signed)
Billy [name]                            CARDIOLOGY OFFICE NOTE   Billy [name], Billy [name]                    MRN:          [id number]  DATE:09/11/2006                            DOB:          01-23-1946    Billy [name] is a 65 year old smoker with hypertension who is referred  for preop clearance.  His cardiac risk factors include hypertension and  smoking.   He is non diabetic, there is a negative family history.   The patient has a right knee complex tear at the posterior horn of his  meniscus, he needs arthroscopic surgery.   In talking to the patient, he has never had a heart problem, he has  fairly long standing hypertension treated with a beta blocker, it is  well controlled.  He has not had a previous stress test or echo.  There  has been no previous history of syncope, palpitations, PND or orthopnea.  He has not had an ultrasound or documented valve disease.   PAST MEDICAL HISTORY:  1. Well-controlled hypertension.  2. Reflux.  3. Chronic pain in his back.  4. Right knee pain.  5. Question of depression.  6. History of basal cell carcinoma.  7. Previous GI bleed with anemia and diverticulosis.  8. He has had previous testicular surgery.  9. He has had chronic lower back pain.   MEDICATIONS:  1. Atenolol 20 mg a day.  2. Darvocet p.r.n.  3. Prilosec 20 a day.  4. Remeron 45 a day.  5. Viagra p.r.n.   He has no known allergies.   Mother passed away at the age of 56.  She had poorly-controlled  diabetes.  Father died at age 23 of a gunshot wound.  He has 3 siblings  alive and well.  He is married with an adopted son.  He works at [employer] in [place].  He used to be fairly active, particularly when he  was in [place] skiing.  He moved here 8 years ago.  His activity  has been limited recently because of his right knee pain.  He is a  smoker, but is trying to cut back.  He does not drink.  There is a  previous history of  alcohol abuse.   REVIEW OF SYSTEMS:  Otherwise negative.   PHYSICAL EXAMINATION:  Blood pressure 120/70, pulse is 64 and regular.  HEENT:  Normal.  Carotid normal without bruits.  JVP is normal.  LUNGS:  Clear.  There is an S1, S2, with normal heart sounds.  ABDOMEN:  Benign.  LOWER EXTREMITIES:  Intact pulses, no edema.   EKG is normal.   IMPRESSION:  A 65 year old without symptoms, hypertension and smoking.  Normal EKG and normal exam.  There is no need for a stress test or an  echo.  The patient is cleared for arthroscopic knee surgery,  particularly if he continues to smoke, I would do an exercise stress  test on the patient in 2 years.   He will to follow up with Dr. Blossom Hoops in regards to his blood pressure  control.  The current low dose  of atenolol seems to be doing the trick.  I did tell him to take his atenolol the morning of his surgery.   He is at low risk for any complications.     Noralyn Pick. Eden Emms, MD, [facility]  Electronically Signed    PCN/MedQ  DD: 09/11/2006  DT: 09/11/2006  Job #: [id number]   cc:   Leanne Chang, M.D.  Almedia Balls. Ranell Patrick, M.D.

## 2010-12-28 ENCOUNTER — Other Ambulatory Visit: Payer: Self-pay | Admitting: *Deleted

## 2010-12-28 MED ORDER — HYDROCODONE-ACETAMINOPHEN 5-325 MG PO TABS: ORAL_TABLET | ORAL | Status: DC

## 2010-12-28 NOTE — Telephone Encounter (Signed)
Rx called to [facility]. 

## 2011-01-07 ENCOUNTER — Other Ambulatory Visit: Payer: Self-pay | Admitting: *Deleted

## 2011-01-07 MED ORDER — OMEPRAZOLE 40 MG PO CPDR: 40.0000 mg | DELAYED_RELEASE_CAPSULE | Freq: Every day | ORAL | Status: DC

## 2011-01-07 NOTE — Telephone Encounter (Signed)
Refilled one time because patient has appt on 01-22-2011

## 2011-01-22 ENCOUNTER — Encounter: Payer: Self-pay | Admitting: Internal Medicine

## 2011-01-22 ENCOUNTER — Ambulatory Visit (INDEPENDENT_AMBULATORY_CARE_PROVIDER_SITE_OTHER): Payer: BC Managed Care – PPO | Admitting: Internal Medicine

## 2011-01-22 VITALS — BP 120/70 | HR 83 | Temp 98.9°F | Ht 76.0 in | Wt 178.0 lb

## 2011-01-22 DIAGNOSIS — I10 Essential (primary) hypertension: Secondary | ICD-10-CM

## 2011-01-22 DIAGNOSIS — K219 Gastro-esophageal reflux disease without esophagitis: Secondary | ICD-10-CM

## 2011-01-22 DIAGNOSIS — G479 Sleep disorder, unspecified: Secondary | ICD-10-CM

## 2011-01-22 DIAGNOSIS — M5137 Other intervertebral disc degeneration, lumbosacral region: Secondary | ICD-10-CM

## 2011-01-22 NOTE — Patient Instructions (Signed)
Please substitute plain tylenol for the hydrocodone when you can to reduce your dependence on the narcotic Please try to skip days on the omeprazole as long as your stomach doesn't bother you

## 2011-01-22 NOTE — Assessment & Plan Note (Signed)
Sleeps well with the mirtazapine Mood is good also---will continue

## 2011-01-22 NOTE — Assessment & Plan Note (Signed)
BP Readings from Last 3 Encounters:  01/22/11 120/70  07/27/10 132/76  02/07/10 136/78   Good control No changes Lab Results  Component Value Date   CREATININE 0.68 08/25/2010

## 2011-01-22 NOTE — Progress Notes (Signed)
  Subjective:    Patient ID: Billy [name], male    DOB: 1946/07/09, 65 y.o.   MRN: [id number]  HPI Doing pretty well  Had flare of hydrocele in April Seen at [facility]----Rx cipro for infection Planning on surgical repair in future  Occ checks BP when in store 130/70 in general No headaches No chest pain No SOB  Chronic back pain Esp with change in weather---humidity change either way Generally uses the hydrocodone regularly tid  Does walk daily Has been eating whatever he wants but weight still down  Mood has been fine No depressed mood  Heartburn is controlled Takes the omeprazole daily still  Current Outpatient Prescriptions on File Prior to Visit  Medication Sig Dispense Refill  . HYDROcodone-acetaminophen (NORCO) 5-325 MG per tablet Take one tablet 3 times a day as needed  90 tablet  0  . omeprazole (PRILOSEC) 40 MG capsule Take 1 capsule (40 mg total) by mouth daily.  90 capsule  0    No Known Allergies  Past Medical History  Diagnosis Date  . Depression     mild at most  . Diverticulosis of colon   . GERD (gastroesophageal reflux disease)   . Hypertension   . Sleep disturbance   . DDD (degenerative disc disease)   . Erectile dysfunction     Past Surgical History  Procedure Date  . Varicocele repair 1980's  . Knee arthroscopy 2008    Right  . Knee arthroscopy 2008    Left    Family History  Problem Relation Age of Onset  . Stroke Mother   . Diabetes Mother   . Coronary artery disease Neg Hx   . Hypertension Neg Hx   . Prostate cancer Neg Hx   . Colon cancer Neg Hx     History   Social History  . Marital Status: Married    Spouse Name: N/A    Number of Children: 1  . Years of Education: N/A   Occupational History  . Retired     mostly from [employer]   Social History Main Topics  . Smoking status: Former Games developer  . Smokeless tobacco: Not on file  . Alcohol Use: Yes     occ  . Drug Use: Not on file  . Sexually Active: Not on file     Other Topics Concern  . Not on file   Social History Narrative   Married- 2nd1 son (adopted) from previous marriageStill looking from some work Using nicorette gum   Review of Systems Only uses the viagra occ---does help though Weight down 8# Sleeps okay--uses mirtazapine    Objective:   Physical Exam  Constitutional: He appears well-developed and well-nourished. No distress.  Neck: Normal range of motion. Neck supple. No thyromegaly present.  Cardiovascular: Normal rate, regular rhythm, normal heart sounds and intact distal pulses.  Exam reveals no gallop.   No murmur heard. Pulmonary/Chest: Effort normal and breath sounds normal. No respiratory distress. He has no wheezes. He has no rales.  Abdominal: Soft. He exhibits no mass.  Musculoskeletal: Normal range of motion. He exhibits no edema and no tenderness.  Lymphadenopathy:    He has no cervical adenopathy.  Psychiatric: He has a normal mood and affect. His behavior is normal. Judgment and thought content normal.          Assessment & Plan:

## 2011-01-22 NOTE — Assessment & Plan Note (Signed)
Again discussed trying to skip days on the omeprazole if he can

## 2011-01-22 NOTE — Assessment & Plan Note (Signed)
Uses the hydrocodone tid Discussed trying to reduce doses by substituting regular tylenol

## 2011-01-25 ENCOUNTER — Other Ambulatory Visit: Payer: Self-pay | Admitting: *Deleted

## 2011-01-25 MED ORDER — HYDROCODONE-ACETAMINOPHEN 5-325 MG PO TABS: ORAL_TABLET | ORAL | Status: DC

## 2011-01-25 NOTE — Telephone Encounter (Signed)
Phoned request from [facility], last filled on 12/28/10.

## 2011-01-25 NOTE — Telephone Encounter (Signed)
rx called into pharmacy

## 2011-01-25 NOTE — Telephone Encounter (Signed)
Okay #90 x 0 

## 2011-02-14 ENCOUNTER — Other Ambulatory Visit: Payer: Self-pay | Admitting: *Deleted

## 2011-02-14 MED ORDER — ATENOLOL 25 MG PO TABS: 25.0000 mg | ORAL_TABLET | Freq: Every day | ORAL | Status: DC

## 2011-02-27 ENCOUNTER — Other Ambulatory Visit: Payer: Self-pay | Admitting: *Deleted

## 2011-02-27 MED ORDER — HYDROCODONE-ACETAMINOPHEN 5-325 MG PO TABS: ORAL_TABLET | ORAL | Status: DC

## 2011-02-27 NOTE — Telephone Encounter (Signed)
Okay #90 x 0 

## 2011-02-27 NOTE — Telephone Encounter (Signed)
Faxed request from [facility]es is on your desk.

## 2011-02-27 NOTE — Telephone Encounter (Signed)
rx faxed to pharmacy manually  

## 2011-04-01 ENCOUNTER — Other Ambulatory Visit: Payer: Self-pay | Admitting: *Deleted

## 2011-04-01 NOTE — Telephone Encounter (Signed)
Okay #90 x 0 

## 2011-04-01 NOTE — Telephone Encounter (Signed)
Form on your desk  

## 2011-04-01 NOTE — Telephone Encounter (Signed)
Okay to refill? 

## 2011-04-02 MED ORDER — HYDROCODONE-ACETAMINOPHEN 5-325 MG PO TABS: ORAL_TABLET | ORAL | Status: DC

## 2011-04-02 NOTE — Telephone Encounter (Signed)
Medication called to [facility]. 

## 2011-05-02 ENCOUNTER — Other Ambulatory Visit: Payer: Self-pay | Admitting: *Deleted

## 2011-05-02 MED ORDER — HYDROCODONE-ACETAMINOPHEN 5-325 MG PO TABS: ORAL_TABLET | ORAL | Status: DC

## 2011-05-02 NOTE — Telephone Encounter (Signed)
Phoned request from patient.  He says [facility] faxed request yesterday but there is nothing in his chart from yesterday.

## 2011-05-02 NOTE — Telephone Encounter (Signed)
Medication called to [facility].

## 2011-05-02 NOTE — Telephone Encounter (Signed)
Okay #90 x 0 

## 2011-05-31 ENCOUNTER — Other Ambulatory Visit: Payer: Self-pay | Admitting: *Deleted

## 2011-05-31 MED ORDER — HYDROCODONE-ACETAMINOPHEN 5-325 MG PO TABS: ORAL_TABLET | ORAL | Status: DC

## 2011-05-31 NOTE — Telephone Encounter (Signed)
Okay #90 x 0 

## 2011-05-31 NOTE — Telephone Encounter (Signed)
rx called into pharmacy

## 2011-06-28 ENCOUNTER — Other Ambulatory Visit: Payer: Self-pay | Admitting: *Deleted

## 2011-06-28 MED ORDER — HYDROCODONE-ACETAMINOPHEN 5-325 MG PO TABS: 1.0000 | ORAL_TABLET | Freq: Three times a day (TID) | ORAL | Status: DC | PRN

## 2011-06-28 NOTE — Telephone Encounter (Signed)
Rx called in as directed.   

## 2011-06-28 NOTE — Telephone Encounter (Signed)
Px written for call in   

## 2011-06-28 NOTE — Telephone Encounter (Signed)
Dr. Letvak patient  

## 2011-07-16 HISTORY — PX: CATARACT EXTRACTION: SUR2

## 2011-07-18 ENCOUNTER — Ambulatory Visit (INDEPENDENT_AMBULATORY_CARE_PROVIDER_SITE_OTHER): Payer: 59 | Admitting: Family Medicine

## 2011-07-18 ENCOUNTER — Encounter: Payer: Self-pay | Admitting: Family Medicine

## 2011-07-18 VITALS — BP 140/92 | HR 105 | Temp 98.5°F | Ht 74.0 in | Wt 180.0 lb

## 2011-07-18 DIAGNOSIS — M5126 Other intervertebral disc displacement, lumbar region: Secondary | ICD-10-CM

## 2011-07-18 DIAGNOSIS — M5116 Intervertebral disc disorders with radiculopathy, lumbar region: Secondary | ICD-10-CM

## 2011-07-18 MED ORDER — CYCLOBENZAPRINE HCL 10 MG PO TABS: 10.0000 mg | ORAL_TABLET | Freq: Three times a day (TID) | ORAL | Status: AC | PRN

## 2011-07-18 MED ORDER — PREDNISONE 20 MG PO TABS: ORAL_TABLET | ORAL | Status: DC

## 2011-07-18 NOTE — Progress Notes (Signed)
  Patient Name: Billy [name] Date of Birth: 01-Jul-1946 Age: 67 y.o. Medical Record Number: [id number] Gender: male Date of Encounter: 07/18/2011  History of Present Illness:  Billy [name] is a 66 y.o. very pleasant male patient who presents with the following:  Has had some chronic low back pain, started to come around the back of his leg, and down the front of the leg until knee. Started last Thursday -- was wahing car and vacuuming. No numbness. No prior spine surgery.   Saw NSG in the past at [facility], did PT and it got better.   Bottom of right foot calmed down some. There is some decreased sensation in this area.  This does feel acutely different compared to his chronic back pain, and he has never had it. Radiates all the way down on his RIGHT leg. No bowel or bladder incontinence.  Past Medical History, Surgical History, Social History, Family History, Problem List, Medications, and Allergies have been reviewed and updated if relevant.  Review of Systems:  GEN: No fevers, chills. Nontoxic. Primarily MSK c/o today. MSK: Detailed in the HPI GI: tolerating PO intake without difficulty Neuro: detailed above Otherwise the pertinent positives of the ROS are noted above.   Physical Examination: Filed Vitals:   07/18/11 0844  BP: 140/92  Pulse: 105  Temp: 98.5 F (36.9 C)  TempSrc: Oral  Height: 6\' 2"  (1.88 m)  Weight: 180 lb (81.647 kg)  SpO2: 93%    Body mass index is 23.11 kg/(m^2).   GEN: Well-developed,well-nourished,in no acute distress; alert,appropriate and cooperative throughout examination HEENT: Normocephalic and atraumatic without obvious abnormalities. Ears, externally no deformities PULM: Breathing comfortably in no respiratory distress EXT: No clubbing, cyanosis, or edema PSYCH: Normally interactive. Cooperative during the interview. Pleasant. Friendly and conversant. Not anxious or depressed appearing. Normal, full affect.  Range of motion at  the  waist: Flexion, extension, lateral bending and rotation: minimal restriction of flexion. Minimal restriction at extension of the waist  With some mild limitation at lateral and rotational movements.  No echymosis or edema Rises to examination table with mild difficulty Gait: minimally antalgic  Inspection/Deformity: N Paraspinus Tenderness: L4-5 and L5-s1 tight  B Ankle Dorsiflexion (L5,4): 5/5 B Great Toe Dorsiflexion (L5,4): 5/5 Heel Walk (L5): WNL Toe Walk (S1): WNL Rise/Squat (L4): WNL, mild pain  SENSORY B Medial Foot (L4): WNL B Dorsum (L5): decreased on right to light touch and pinprick B Lateral (S1): WNL  REFLEXES Knee (L4): 2+ Ankle (S1): 2+  B SLR, seated: neg B SLR, supine: neg B FABER: neg B Reverse FABER: neg B Greater Troch: NT B Log Roll: neg B Stork: NT B Sciatic Notch: NT   Assessment and Plan: 1. Lumbar disc herniation with radiculopathy  predniSONE (DELTASONE) 20 MG tablet, cyclobenzaprine (FLEXERIL) 10 MG tablet    He has been trying his home physical therapy that he use from his prior therapy appointments and back problems.  1st then taper prednisone, Flexeril at nighttime.  Recheck in one month.

## 2011-07-26 ENCOUNTER — Other Ambulatory Visit: Payer: Self-pay | Admitting: *Deleted

## 2011-07-26 MED ORDER — HYDROCODONE-ACETAMINOPHEN 5-325 MG PO TABS: 1.0000 | ORAL_TABLET | Freq: Three times a day (TID) | ORAL | Status: DC | PRN

## 2011-07-26 NOTE — Telephone Encounter (Signed)
Okay #90 x 0 

## 2011-07-26 NOTE — Telephone Encounter (Signed)
Sent to me? - looks like Dr Alphonsus Sias ok'd it -his pt

## 2011-07-26 NOTE — Telephone Encounter (Signed)
rx called into pharmacy

## 2011-08-02 ENCOUNTER — Encounter: Payer: BC Managed Care – PPO | Admitting: Internal Medicine

## 2011-08-09 ENCOUNTER — Ambulatory Visit (INDEPENDENT_AMBULATORY_CARE_PROVIDER_SITE_OTHER): Payer: 59 | Admitting: Internal Medicine

## 2011-08-09 ENCOUNTER — Encounter: Payer: Self-pay | Admitting: Internal Medicine

## 2011-08-09 VITALS — BP 138/70 | HR 85 | Temp 97.9°F | Ht 74.0 in | Wt 180.0 lb

## 2011-08-09 DIAGNOSIS — M549 Dorsalgia, unspecified: Secondary | ICD-10-CM

## 2011-08-09 DIAGNOSIS — Z2911 Encounter for prophylactic immunotherapy for respiratory syncytial virus (RSV): Secondary | ICD-10-CM

## 2011-08-09 DIAGNOSIS — Z23 Encounter for immunization: Secondary | ICD-10-CM

## 2011-08-09 DIAGNOSIS — Z0000 Encounter for general adult medical examination without abnormal findings: Secondary | ICD-10-CM

## 2011-08-09 DIAGNOSIS — I10 Essential (primary) hypertension: Secondary | ICD-10-CM

## 2011-08-09 DIAGNOSIS — G479 Sleep disorder, unspecified: Secondary | ICD-10-CM

## 2011-08-09 LAB — BASIC METABOLIC PANEL
BUN: 9 mg/dL (ref 6–23)
CO2: 31 mEq/L (ref 19–32)
Calcium: 8.9 mg/dL (ref 8.4–10.5)
Chloride: 103 mEq/L (ref 96–112)
Creatinine, Ser: 0.6 mg/dL (ref 0.4–1.5)
GFR: 147.28 mL/min (ref >60.00)
Glucose, Bld: 91 mg/dL (ref 70–99)
Potassium: 4.5 mEq/L (ref 3.5–5.1)
Sodium: 143 mEq/L (ref 135–145)

## 2011-08-09 LAB — CBC WITH DIFFERENTIAL/PLATELET
Basophils Absolute: 0 10*3/uL (ref 0.0–0.1)
Eosinophils Absolute: 0.1 10*3/uL (ref 0.0–0.7)
HCT: 44.7 % (ref 39.0–52.0)
Lymphs Abs: 1.1 10*3/uL (ref 0.7–4.0)
MCV: 102.8 fl — ABNORMAL HIGH (ref 78.0–100.0)
Monocytes Absolute: 0.5 10*3/uL (ref 0.1–1.0)
Monocytes Relative: 8.9 % (ref 3.0–12.0)
Platelets: 109 10*3/uL — ABNORMAL LOW (ref 150.0–400.0)
RDW: 14 % (ref 11.5–14.6)

## 2011-08-09 LAB — HEPATIC FUNCTION PANEL: Albumin: 3.7 g/dL (ref 3.5–5.2)

## 2011-08-09 LAB — TSH: TSH: 1.46 u[IU]/mL (ref 0.35–5.50)

## 2011-08-09 NOTE — Assessment & Plan Note (Signed)
BP Readings from Last 3 Encounters:  08/09/11 138/70  07/18/11 140/92  01/22/11 120/70   Control has been okay Due for labs Lab Results  Component Value Date   LDLCALC 75 08/09/2009

## 2011-08-09 NOTE — Assessment & Plan Note (Signed)
Generally healthy Will give zostavax No PSA after discussion

## 2011-08-09 NOTE — Assessment & Plan Note (Signed)
And has had anxiety in the past Will continue the mirtazapine

## 2011-08-09 NOTE — Progress Notes (Signed)
Subjective:    Patient ID: Billy [name], male    DOB: Apr 25, 1946, 66 y.o.   MRN: [id number]  HPI Here for physical Saw Dr Patsy Lager recently ---had sudden apparent sciatica with pain to right leg This is better now  Still with chronic back pain Took 4 hydrocodone for pain with exacerbation Now back to tid regularly  Otherwise has been stable  Current Outpatient Prescriptions on File Prior to Visit  Medication Sig Dispense Refill  . atenolol (TENORMIN) 25 MG tablet Take 1 tablet (25 mg total) by mouth daily.  90 tablet  3  . HYDROcodone-acetaminophen (NORCO) 5-325 MG per tablet Take 1 tablet by mouth 3 (three) times daily as needed.  90 tablet  0  . mirtazapine (REMERON) 45 MG tablet Take 45 mg by mouth at bedtime.        Marland Kitchen omeprazole (PRILOSEC) 40 MG capsule Take 1 capsule (40 mg total) by mouth daily.  90 capsule  0  . sildenafil (VIAGRA) 50 MG tablet Take 50 mg by mouth. Take one by mouth as directed          No Known Allergies  Past Medical History  Diagnosis Date  . Depression     mild at most  . Diverticulosis of colon   . GERD (gastroesophageal reflux disease)   . Hypertension   . Sleep disturbance   . DDD (degenerative disc disease)   . Erectile dysfunction     Past Surgical History  Procedure Date  . Varicocele repair 1980's  . Knee arthroscopy 2008    Right  . Knee arthroscopy 2008    Left    Family History  Problem Relation Age of Onset  . Stroke Mother   . Diabetes Mother   . Coronary artery disease Neg Hx   . Hypertension Neg Hx   . Prostate cancer Neg Hx   . Colon cancer Neg Hx     History   Social History  . Marital Status: Married    Spouse Name: N/A    Number of Children: 1  . Years of Education: N/A   Occupational History  . Retired     mostly from [employer]   Social History Main Topics  . Smoking status: Former Games developer  . Smokeless tobacco: Not on file  . Alcohol Use: Yes     occ  . Drug Use: Not on file  . Sexually Active:  Not on file   Other Topics Concern  . Not on file   Social History Narrative   Married- 2nd1 son (adopted) from previous marriageStill looking from some work Using nicorette gum   Review of Systems  Constitutional: Negative for fatigue and unexpected weight change.       Wears seat belt  HENT: Negative for hearing loss, congestion, rhinorrhea, dental problem and tinnitus.        Regular with dentist  Eyes: Negative for visual disturbance.       No diplopia or unilateral vision loss  Respiratory: Negative for cough, chest tightness and shortness of breath.   Cardiovascular: Negative for chest pain, palpitations and leg swelling.  Gastrointestinal: Negative for nausea, vomiting, abdominal pain, constipation and blood in stool.       Heartburn has been quiet Takes omeprazole ~ every other day  Genitourinary: Negative for urgency, frequency and difficulty urinating.       Some ED he relates to hydrocele. May go back to the urologist  Musculoskeletal: Positive for back pain. Negative for joint swelling  and arthralgias.  Skin: Negative for rash.       No suspicious lesions Did have derm visit and treatment of some lesions on face  Neurological: Negative for dizziness, syncope, weakness, light-headedness, numbness and headaches.  Hematological: Negative for adenopathy. Does not bruise/bleed easily.  Psychiatric/Behavioral: Positive for sleep disturbance. Negative for dysphoric mood. The patient is not nervous/anxious.        Mild sleep problems but generally okay       Objective:   Physical Exam  Constitutional: He is oriented to person, place, and time. He appears well-developed and well-nourished. No distress.  HENT:  Head: Normocephalic and atraumatic.  Right Ear: External ear normal.  Left Ear: External ear normal.  Mouth/Throat: Oropharynx is clear and moist. No oropharyngeal exudate.       TMs normal  Eyes: Conjunctivae and EOM are normal.       Anisocoria with persistent  dilation on left  Neck: Normal range of motion. Neck supple. No thyromegaly present.  Cardiovascular: Normal rate, regular rhythm, normal heart sounds and intact distal pulses.  Exam reveals no gallop.   No murmur heard. Pulmonary/Chest: Effort normal and breath sounds normal. No respiratory distress. He has no wheezes. He has no rales.  Abdominal: Soft. There is no tenderness.  Musculoskeletal: He exhibits no edema and no tenderness.  Lymphadenopathy:    He has no cervical adenopathy.  Neurological: He is alert and oriented to person, place, and time.  Skin: No rash noted. No pallor.       Benign nevi  Psychiatric: He has a normal mood and affect. His behavior is normal. Judgment and thought content normal.          Assessment & Plan:

## 2011-08-09 NOTE — Assessment & Plan Note (Signed)
Still uses the hydrocodone prn

## 2011-08-16 DIAGNOSIS — K766 Portal hypertension: Secondary | ICD-10-CM

## 2011-08-16 DIAGNOSIS — I8500 Esophageal varices without bleeding: Secondary | ICD-10-CM

## 2011-08-16 DIAGNOSIS — K259 Gastric ulcer, unspecified as acute or chronic, without hemorrhage or perforation: Secondary | ICD-10-CM

## 2011-08-16 HISTORY — DX: Esophageal varices without bleeding: I85.00

## 2011-08-16 HISTORY — DX: Gastric ulcer, unspecified as acute or chronic, without hemorrhage or perforation: K25.9

## 2011-08-16 HISTORY — DX: Portal hypertension: K76.6

## 2011-08-20 ENCOUNTER — Other Ambulatory Visit: Payer: Self-pay | Admitting: *Deleted

## 2011-08-20 MED ORDER — OMEPRAZOLE 40 MG PO CPDR: 40.0000 mg | DELAYED_RELEASE_CAPSULE | Freq: Every day | ORAL | Status: DC

## 2011-08-23 ENCOUNTER — Other Ambulatory Visit: Payer: Self-pay | Admitting: *Deleted

## 2011-08-23 MED ORDER — HYDROCODONE-ACETAMINOPHEN 5-325 MG PO TABS: 1.0000 | ORAL_TABLET | Freq: Three times a day (TID) | ORAL | Status: DC | PRN

## 2011-08-23 NOTE — Telephone Encounter (Signed)
rx called into pharmacy

## 2011-08-23 NOTE — Telephone Encounter (Signed)
Please call in

## 2011-08-23 NOTE — Telephone Encounter (Signed)
Please advise in Dr. Karle Starch absence.

## 2011-08-29 ENCOUNTER — Other Ambulatory Visit: Payer: Self-pay | Admitting: *Deleted

## 2011-08-29 MED ORDER — ATENOLOL 25 MG PO TABS: 25.0000 mg | ORAL_TABLET | Freq: Every day | ORAL | Status: DC

## 2011-09-02 ENCOUNTER — Ambulatory Visit (INDEPENDENT_AMBULATORY_CARE_PROVIDER_SITE_OTHER): Payer: 59 | Admitting: Family Medicine

## 2011-09-02 ENCOUNTER — Encounter: Payer: Self-pay | Admitting: Family Medicine

## 2011-09-02 ENCOUNTER — Encounter: Payer: Self-pay | Admitting: Internal Medicine

## 2011-09-02 VITALS — BP 112/58 | HR 85 | Temp 98.4°F | Wt 181.8 lb

## 2011-09-02 DIAGNOSIS — K921 Melena: Secondary | ICD-10-CM

## 2011-09-02 LAB — CBC WITH DIFFERENTIAL/PLATELET
Eosinophils Absolute: 0.1 10*3/uL (ref 0.0–0.7)
Eosinophils Relative: 0.7 % (ref 0.0–5.0)
Lymphocytes Relative: 14.6 % (ref 12.0–46.0)
MCHC: 34 g/dL (ref 30.0–36.0)
MCV: 103.2 fl — ABNORMAL HIGH (ref 78.0–100.0)
Monocytes Absolute: 0.7 10*3/uL (ref 0.1–1.0)
Neutrophils Relative %: 75.7 % (ref 43.0–77.0)
Platelets: 109 10*3/uL — ABNORMAL LOW (ref 150.0–400.0)
RBC: 3.03 Mil/uL — ABNORMAL LOW (ref 4.22–5.81)
WBC: 8.7 10*3/uL (ref 4.5–10.5)

## 2011-09-02 NOTE — Patient Instructions (Signed)
You can get your results through our phone system.  Follow the instructions on the blue card. See Shirlee Limerick about your referral before you leave today. Don't take any aspirin/ibuprofen/aleve.

## 2011-09-02 NOTE — Progress Notes (Signed)
Thursday afternoon felt lightheaded.  Then had bloody diarrhea x2.  The diarrhea is better but he still has bleeding with BMs.  Stool is dark red, occ with some bright red blood.  Feels well o/w.  Now w/o lightheaded feeling.  No CP, not SOB.  No vomiting.  Had colonoscopy prev, ~2007 w/o sig pathology.  No other bleeding, bruising.  No FH colon CA.    Meds, vitals, and allergies reviewed.   ROS: See HPI.  Otherwise, noncontributory.  nad ncat Mmm rrr ctab abd soft, not ttp, no masses, no rebound, normal BS Ext w/o edema Rectal exam heme pos and with ext hemorrhoids

## 2011-09-04 DIAGNOSIS — K922 Gastrointestinal hemorrhage, unspecified: Secondary | ICD-10-CM | POA: Insufficient documentation

## 2011-09-04 NOTE — Assessment & Plan Note (Signed)
Cbc noted, okay for outpatient /fu as long as sx aren't progressive.  He understood to go to ER if profoundly weak.  Refer to GI.  He agrees with plan.  App GI help.

## 2011-09-05 ENCOUNTER — Other Ambulatory Visit: Payer: 59

## 2011-09-05 ENCOUNTER — Ambulatory Visit (INDEPENDENT_AMBULATORY_CARE_PROVIDER_SITE_OTHER): Payer: 59 | Admitting: Internal Medicine

## 2011-09-05 ENCOUNTER — Encounter: Payer: Self-pay | Admitting: Internal Medicine

## 2011-09-05 ENCOUNTER — Other Ambulatory Visit (INDEPENDENT_AMBULATORY_CARE_PROVIDER_SITE_OTHER): Payer: 59

## 2011-09-05 VITALS — BP 120/62 | HR 88 | Ht 75.0 in | Wt 180.2 lb

## 2011-09-05 DIAGNOSIS — K922 Gastrointestinal hemorrhage, unspecified: Secondary | ICD-10-CM

## 2011-09-05 DIAGNOSIS — D696 Thrombocytopenia, unspecified: Secondary | ICD-10-CM

## 2011-09-05 DIAGNOSIS — Z860100 Personal history of colon polyps, unspecified: Secondary | ICD-10-CM | POA: Insufficient documentation

## 2011-09-05 DIAGNOSIS — R748 Abnormal levels of other serum enzymes: Secondary | ICD-10-CM

## 2011-09-05 DIAGNOSIS — F1010 Alcohol abuse, uncomplicated: Secondary | ICD-10-CM

## 2011-09-05 DIAGNOSIS — D500 Iron deficiency anemia secondary to blood loss (chronic): Secondary | ICD-10-CM | POA: Insufficient documentation

## 2011-09-05 DIAGNOSIS — D62 Acute posthemorrhagic anemia: Secondary | ICD-10-CM

## 2011-09-05 LAB — PROTIME-INR
INR: 1 ratio (ref 0.8–1.0)
Prothrombin Time: 11.3 s (ref 10.2–12.4)

## 2011-09-05 LAB — CBC WITH DIFFERENTIAL/PLATELET
Basophils Absolute: 0 10*3/uL (ref 0.0–0.1)
Eosinophils Absolute: 0.1 10*3/uL (ref 0.0–0.7)
Lymphocytes Relative: 19.6 % (ref 12.0–46.0)
MCHC: 33.3 g/dL (ref 30.0–36.0)
Monocytes Relative: 12.7 % — ABNORMAL HIGH (ref 3.0–12.0)
Neutrophils Relative %: 65.6 % (ref 43.0–77.0)
Platelets: 138 10*3/uL — ABNORMAL LOW (ref 150.0–400.0)
RDW: 14.5 % (ref 11.5–14.6)

## 2011-09-05 MED ORDER — PEG-KCL-NACL-NASULF-NA ASC-C 100 G PO SOLR: 1.0000 | Freq: Once | ORAL | Status: DC

## 2011-09-05 NOTE — Assessment & Plan Note (Addendum)
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 He may even have cirrhosis. The thrombocytopenia, or tumor in abdomen with prominent veins are suggestive of complicated alcoholic liver disease and probable cirrhosis. He has chronic elevation of his alkaline phosphatase and transaminases. He is still drinking. I have explained to him that he should not be using any alcohol as it increases risk of ongoing and progressive liver damage. He was advised to stop. He also has a macrocytosis which could be from alcohol or distal liver disease itself.  Further plans pending the endoscopic evaluations. He will probably end up needing ultrasound as well.

## 2011-09-05 NOTE — Patient Instructions (Signed)
Please go to the basement upon leaving today to have your labs done. You have been scheduled for an Endoscopy/Colonoscopy @ [facility] with separate instructions given. Your prep kit has been sent to your pharmacy for you to pick up. Stop ALL alcohol! If you develop severe bleeding than what you are experiencing please report to the Emergency Room Department.

## 2011-09-05 NOTE — Assessment & Plan Note (Addendum)
He had painless hematochezia. Clinical scenario most consistent with diverticular bleeding. This appears to be resolved. Exact cause unknown so we'll pursue upper endoscopy and colonoscopy. Risks benefits and indications of the procedures explained the patient understands and agrees to proceed. With his history of esophageal varices, it is possible he had bleeding from these though without melena or hematemesis seems very unlikely to me. I have instructed him to go to the emergency room if he has further bleeding.  Note the ProTime was normal today.

## 2011-09-05 NOTE — Assessment & Plan Note (Signed)
Hemoglobin stable in the 10 range.

## 2011-09-05 NOTE — Progress Notes (Signed)
Subjective:    Patient ID: Billy [name], male    DOB: [birth year], 66 y.o.   MRN: [id number]  HPI This 66 year old man was is in his usual state of health until  one week ago. He was at the store, he felt lightheaded and he proceeded to have bloody bowel movements. He had several that day, red in color without melena. He said bright red to dark. Over time it dissipated over the next couple of days or so and stopped by 2 days ago. He was evaluated by primary care and found to have a hemoglobin of 10 and a heme positive stool at that time. It was actually bloody on the rectal exam. He had no abdominal pain with this. It was of sudden onset and it resolved spontaneously as noted. He is taking no anticoagulants or antiplatelet agents. No anti-inflammatories used. He had no nausea or vomiting.  His history is notable for previous colonoscopies with small polyps, most recently in 2007 with no polyps but diverticulosis in the left colon. He also had an upper GI bleed in 2006, and records review shows that he had esophagitis and 1+ varices and was referred to alcoholics anonymous at that time. He currently admits to drinking a few times a week at least, one or 2 beers or mixed drinks. He says he does not drink every day.  He feels completely recovered at this time he tells me, with no abdominal pain lightheadedness or weakness.  No Known Allergies Outpatient Prescriptions Prior to Visit  Medication Sig Dispense Refill  . atenolol (TENORMIN) 25 MG tablet Take 1 tablet (25 mg total) by mouth daily.  90 tablet  3  . HYDROcodone-acetaminophen (NORCO) 5-325 MG per tablet Take 1 tablet by mouth 3 (three) times daily as needed.  90 tablet  0  . mirtazapine (REMERON) 45 MG tablet Take 45 mg by mouth at bedtime.        . Multiple Vitamin (MULTIVITAMIN) tablet Take 1 tablet by mouth daily.      . NON FORMULARY Mega Red once daily.      Marland Kitchen omeprazole (PRILOSEC) 40 MG capsule Take 1 capsule (40 mg total) by mouth  daily.  90 capsule  3  . sildenafil (VIAGRA) 50 MG tablet Take 50 mg by mouth. Take one by mouth as directed        . vitamin C (ASCORBIC ACID) 500 MG tablet Take 500 mg by mouth daily.       Past Medical History  Diagnosis Date  . Depression     mild at most  . Diverticulosis of colon   . GERD (gastroesophageal reflux disease)   . Hypertension   . Sleep disturbance   . DDD (degenerative disc disease)   . Erectile dysfunction   . GI hemorrhage 2006    esophagitis and 1+ variced  . Alcoholism    Past Surgical History  Procedure Date  . Varicocele repair 1980's  . Knee arthroscopy 2008    Right  . Knee arthroscopy 2008    Left  . Colonoscopy 2001, 200411/02/2006  . Esophagogastroduodenoscopy 2006   History   Social History  . Marital Status: Married    Spouse Name: N/A    Number of Children: 1  . Years of Education: N/A   Occupational History  . Retired     mostly from [employer]   Social History Main Topics  . Smoking status: Former Games developer  . Smokeless tobacco: Never Used  . Alcohol Use: Yes  occ  . Drug Use: No  . Sexually Active: None   Other Topics Concern  . None   Social History Narrative   Married- 2nd1 son (adopted) from previous marriageStill looking from some work Using nicorette gum   Family History  Problem Relation Age of Onset  . Stroke Mother   . Diabetes Mother         Review of Systems This is positive for chronic back pain control with narcotics. All other review of systems negative or as mentioned in the history of present illness    Objective:   Physical Exam General:  Well-developed, well-nourished and in no acute distress Eyes:  Anicteric. + anisocoria Left > right  ENT:   Mouth and posterior pharynx free of lesions.  Neck:   supple w/o thyromegaly or mass.  Lungs: Clear to auscultation bilaterally. Heart:  S1S2, no rubs, murmurs, gallops. Abdomen:  soft, non-tender, no hepatosplenomegaly, hernia, or mass and BS+. It is  protuberant with prominent veins in wall, ? Ascites possible Rectal: Manson Passey and heme negative stool Lymph:  no cervical or supraclavicular adenopathy. Extremities:   Trace bilateral LE edema, pretibial, + varicosities Skin   no rash. Psych:  appropriate mood and  Affect.   Data Reviewed: Lab Results  Component Value Date   WBC 5.7 09/05/2011   HGB 10.4* 09/05/2011   HCT 31.4* 09/05/2011   MCV 102.3* 09/05/2011   PLT 138.0* 09/05/2011     Chemistry      Component Value Date/Time   NA 143 08/09/2011 1239   K 4.5 08/09/2011 1239   CL 103 08/09/2011 1239   CO2 31 08/09/2011 1239   BUN 9 08/09/2011 1239   CREATININE 0.6 08/09/2011 1239      Component Value Date/Time   CALCIUM 8.9 08/09/2011 1239   ALKPHOS 147* 08/09/2011 1239   AST 51* 08/09/2011 1239   ALT 30 08/09/2011 1239   BILITOT 1.1 08/09/2011 1239      Old hospitalization records, colonoscopy and pathology reports as well as recent primary care notes and labs.        Assessment & Plan:  See the problem oriented charting as well. Overall I suspect he had bleeding from diverticulosis but we need to look for the cause. I have also uncovered what I think is alcoholic liver disease which could be related to the bleeding but probably not.

## 2011-09-06 NOTE — Progress Notes (Signed)
Quick Note:  Let him know Hgb stable Will explain more next week ______

## 2011-09-11 ENCOUNTER — Encounter (HOSPITAL_COMMUNITY): Payer: Self-pay

## 2011-09-11 ENCOUNTER — Ambulatory Visit (HOSPITAL_COMMUNITY)
Admission: RE | Admit: 2011-09-11 | Discharge: 2011-09-11 | Disposition: A | Discharge status: Home / Self Care | Payer: 59 | Source: Ambulatory Visit | Attending: Internal Medicine | Admitting: Internal Medicine

## 2011-09-11 ENCOUNTER — Encounter (HOSPITAL_COMMUNITY)
Admission: RE | Disposition: A | Discharge status: Home / Self Care | Payer: Self-pay | Source: Ambulatory Visit | Attending: Internal Medicine

## 2011-09-11 DIAGNOSIS — K922 Gastrointestinal hemorrhage, unspecified: Secondary | ICD-10-CM

## 2011-09-11 DIAGNOSIS — K649 Unspecified hemorrhoids: Secondary | ICD-10-CM

## 2011-09-11 DIAGNOSIS — K259 Gastric ulcer, unspecified as acute or chronic, without hemorrhage or perforation: Secondary | ICD-10-CM | POA: Insufficient documentation

## 2011-09-11 DIAGNOSIS — K5520 Angiodysplasia of colon without hemorrhage: Secondary | ICD-10-CM

## 2011-09-11 DIAGNOSIS — I8500 Esophageal varices without bleeding: Secondary | ICD-10-CM

## 2011-09-11 DIAGNOSIS — I8510 Secondary esophageal varices without bleeding: Secondary | ICD-10-CM | POA: Insufficient documentation

## 2011-09-11 DIAGNOSIS — K766 Portal hypertension: Secondary | ICD-10-CM | POA: Insufficient documentation

## 2011-09-11 DIAGNOSIS — K921 Melena: Secondary | ICD-10-CM | POA: Insufficient documentation

## 2011-09-11 DIAGNOSIS — K648 Other hemorrhoids: Secondary | ICD-10-CM | POA: Insufficient documentation

## 2011-09-11 DIAGNOSIS — K5730 Diverticulosis of large intestine without perforation or abscess without bleeding: Secondary | ICD-10-CM | POA: Insufficient documentation

## 2011-09-11 DIAGNOSIS — K319 Disease of stomach and duodenum, unspecified: Secondary | ICD-10-CM | POA: Insufficient documentation

## 2011-09-11 DIAGNOSIS — K2980 Duodenitis without bleeding: Secondary | ICD-10-CM | POA: Insufficient documentation

## 2011-09-11 HISTORY — DX: Angiodysplasia of colon without hemorrhage: K55.20

## 2011-09-11 HISTORY — PX: COLONOSCOPY: SHX5424

## 2011-09-11 HISTORY — DX: Unspecified hemorrhoids: K64.9

## 2011-09-11 HISTORY — PX: ESOPHAGOGASTRODUODENOSCOPY: SHX5428

## 2011-09-11 SURGERY — EGD (ESOPHAGOGASTRODUODENOSCOPY)
Anesthesia: Moderate Sedation

## 2011-09-11 MED ORDER — MIDAZOLAM HCL 5 MG/5ML IJ SOLN
INTRAMUSCULAR | Status: DC | PRN
  Administered 2011-09-11: 2 mg via INTRAVENOUS

## 2011-09-11 MED ORDER — MIDAZOLAM HCL 10 MG/2ML IJ SOLN
INTRAMUSCULAR | Status: DC | PRN
  Administered 2011-09-11: 1 mg via INTRAVENOUS
  Administered 2011-09-11: 2 mg via INTRAVENOUS
  Administered 2011-09-11: 1 mg via INTRAVENOUS
  Administered 2011-09-11 (×2): 2 mg via INTRAVENOUS

## 2011-09-11 MED ORDER — FENTANYL NICU IV SYRINGE 50 MCG/ML
INJECTION | INTRAMUSCULAR | Status: DC | PRN
  Administered 2011-09-11 (×4): 25 ug via INTRAVENOUS

## 2011-09-11 MED ORDER — BUTAMBEN-TETRACAINE-BENZOCAINE 2-2-14 % EX AERO
INHALATION_SPRAY | CUTANEOUS | Status: DC | PRN
  Administered 2011-09-11: 2 via TOPICAL

## 2011-09-11 MED ORDER — DIPHENHYDRAMINE HCL 50 MG/ML IJ SOLN
INTRAMUSCULAR | Status: DC | PRN
  Administered 2011-09-11: 12.5 mg via INTRAVENOUS

## 2011-09-11 MED ORDER — MIDAZOLAM HCL 10 MG/2ML IJ SOLN
INTRAMUSCULAR | Status: AC
  Filled 2011-09-11: qty 4

## 2011-09-11 MED ORDER — DIPHENHYDRAMINE HCL 50 MG/ML IJ SOLN
INTRAMUSCULAR | Status: AC
  Filled 2011-09-11: qty 1

## 2011-09-11 MED ORDER — SODIUM CHLORIDE 0.9 % IV SOLN: INTRAVENOUS | Status: DC

## 2011-09-11 MED ORDER — FENTANYL CITRATE 0.05 MG/ML IJ SOLN
INTRAMUSCULAR | Status: AC
  Filled 2011-09-11: qty 4

## 2011-09-11 NOTE — Discharge Instructions (Addendum)
Please see the procedure reports for the findings. It looks like you have significant liver disease and I suspect cirrhosis. Please stop all alcohol if you haven't. Do not take medications like ibuprofen, aleve, etc. Limit acetaminophen to 2000 mg a day. My office will be contacting you about an ultrasound and a follow-up appointment. If you do not hear by next week please call us back. Iva Boop, MD, [facility]   Esophagogastroduodenoscopy This is an endoscopic procedure (a procedure that uses a device like a flexible telescope) that allows your caregiver to view the upper stomach and small bowel. This test allows your caregiver to look at the esophagus. The esophagus carries food from your mouth to your stomach. They can also look at your duodenum. This is the first part of the small intestine that attaches to the stomach. This test is used to detect problems in the bowel such as ulcers and inflammation. PREPARATION FOR TEST Nothing to eat after midnight the day before the test. NORMAL FINDINGS Normal esophagus, stomach, and duodenum. Ranges for normal findings may vary among different laboratories and hospitals. You should always check with your doctor after having lab work or other tests done to discuss the meaning of your test results and whether your values are considered within normal limits. MEANING OF TEST  Your caregiver will go over the test results with you and discuss the importance and meaning of your results, as well as treatment options and the need for additional tests if necessary. OBTAINING THE TEST RESULTS It is your responsibility to obtain your test results. Ask the lab or department performing the test when and how you will get your results. Document Released: 11/01/2004 Document Revised: 03/13/2011 Document Reviewed: 06/10/2008  [facility] Patient Information 2012 [place], [place].Colonoscopy A colonoscopy is an exam to evaluate your entire colon. In this exam, your colon is  cleansed. A long fiberoptic tube is inserted through your rectum and into your colon. The fiberoptic scope (endoscope) is a long bundle of enclosed and very flexible fibers. These fibers transmit light to the area examined and send images from that area to your caregiver. Discomfort is usually minimal. You may be given a drug to help you sleep (sedative) during or prior to the procedure. This exam helps to detect lumps (tumors), polyps, inflammation, and areas of bleeding. Your caregiver may also take a small piece of tissue (biopsy) that will be examined under a microscope. LET YOUR CAREGIVER KNOW ABOUT:   Allergies to food or medicine.   Medicines taken, including vitamins, herbs, eyedrops, over-the-counter medicines, and creams.   Use of steroids (by mouth or creams).   Previous problems with anesthetics or numbing medicines.   History of bleeding problems or blood clots.   Previous surgery.   Other health problems, including diabetes and kidney problems.   Possibility of pregnancy, if this applies.  BEFORE THE PROCEDURE   A clear liquid diet may be required for 2 days before the exam.   Ask your caregiver about changing or stopping your regular medications.   Liquid injections (enemas) or laxatives may be required.   A large amount of electrolyte solution may be given to you to drink over a short period of time. This solution is used to clean out your colon.   You should be present 60 minutes prior to your procedure or as directed by your caregiver.  AFTER THE PROCEDURE   If you received a sedative or pain relieving medication, you will need to arrange for someone to  drive you home.   Occasionally, there is a little blood passed with the first bowel movement. Do not be concerned.  FINDING OUT THE RESULTS OF YOUR TEST Not all test results are available during your visit. If your test results are not back during the visit, make an appointment with your caregiver to find out the  results. Do not assume everything is normal if you have not heard from your caregiver or the medical facility. It is important for you to follow up on all of your test results. HOME CARE INSTRUCTIONS   It is not unusual to pass moderate amounts of gas and experience mild abdominal cramping following the procedure. This is due to air being used to inflate your colon during the exam. Walking or a warm pack on your belly (abdomen) may help.   You may resume all normal meals and activities after sedatives and medicines have worn off.   Only take over-the-counter or prescription medicines for pain, discomfort, or fever as directed by your caregiver. Do not use aspirin or blood thinners if a biopsy was taken. Consult your caregiver for medicine usage if biopsies were taken.  SEEK IMMEDIATE MEDICAL CARE IF:   You have a fever.   You pass large blood clots or fill a toilet with blood following the procedure. This may also occur 10 to 14 days following the procedure. This is more likely if a biopsy was taken.   You develop abdominal pain that keeps getting worse and cannot be relieved with medicine.  Document Released: 06/28/2000 Document Revised: 03/13/2011 Document Reviewed: 02/11/2008 [facility] Patient Information 2012 [place], [place].Esophagogastroduodenoscopy This is an endoscopic procedure (a procedure that uses a device like a flexible telescope) that allows your caregiver to view the upper stomach and small bowel. This test allows your caregiver to look at the esophagus. The esophagus carries food from your mouth to your stomach. They can also look at your duodenum. This is the first part of the small intestine that attaches to the stomach. This test is used to detect problems in the bowel such as ulcers and inflammation. PREPARATION FOR TEST Nothing to eat after midnight the day before the test. NORMAL FINDINGS Normal esophagus, stomach, and duodenum. Ranges for normal findings may vary among  different laboratories and hospitals. You should always check with your doctor after having lab work or other tests done to discuss the meaning of your test results and whether your values are considered within normal limits. MEANING OF TEST  Your caregiver will go over the test results with you and discuss the importance and meaning of your results, as well as treatment options and the need for additional tests if necessary. OBTAINING THE TEST RESULTS It is your responsibility to obtain your test results. Ask the lab or department performing the test when and how you will get your results. Document Released: 11/01/2004 Document Revised: 03/13/2011 Document Reviewed: 06/10/2008 [employer] Patient Information 2012 Exit[employer], LLCColonoscopy Care After These instructions give you information on caring for yourself after your procedure. Your doctor may also give you more specific instructions. Call your doctor if you have any problems or questions after your procedure. HOME CARE  Take it easy for the next 24 hours.   Rest.   Walk or use warm packs on your belly (abdomen) if you have belly cramping or gas.   Do not drive for 24 hours.   You may shower.   Do not sign important papers or use machinery for 24 hours.  Drink enough fluids to keep your pee (urine) clear or pale yellow.   Resume your normal diet. Avoid heavy or fried foods.   Avoid alcohol.   Continue taking your normal medicines.   Only take medicine as told by your doctor. Do not take aspirin.  If you had growths (polyps) removed:  Do not take aspirin.   Do not drink alcohol for 7 days or as told by your doctor.   Eat a soft diet for 24 hours.  GET HELP RIGHT AWAY IF:  You have a fever.   You pass clumps of tissue (blood clots) or fill the toilet with blood.   You have belly pain that gets worse and medicine does not help.   Your belly is puffy (swollen).   You feel sick to your stomach (nauseous) or throw up  (vomit).  MAKE SURE YOU:  Understand these instructions.   Will watch your condition.   Will get help right away if you are not doing well or get worse.  Document Released: 08/03/2010 Document Revised: 03/13/2011 Document Reviewed: 08/03/2010 [facility] Patient Information 2012 [place], [place].Marland Kitchen

## 2011-09-11 NOTE — Op Note (Signed)
[facility] [address] [place], [place]  [address]  COLONOSCOPY PROCEDURE REPORT  PATIENT:  Billy [name], Billy [name]  MR#:  [id number] BIRTHDATE:  17-Nov-1945, 65 yrs. old  GENDER:  male ENDOSCOPIST:  Iva Boop, MD, [facility] REF. BY:  Crawford Givens, M.D. PROCEDURE DATE:  09/11/2011 PROCEDURE:  Colonoscopy with ablation of AVM's ASA CLASS:  Class III INDICATIONS:  hematochezia in setting of suspected cirrhosis MEDICATIONS:   There was residual sedation effect present from prior procedure., Fentanyl 50 mcg IV, Versed 5 mg IV  DESCRIPTION OF PROCEDURE:   After the risks benefits and alternatives of the procedure were thoroughly explained, informed consent was obtained.  Digital rectal exam was performed and revealed tender and no rectal masses.   The Pentax Colonoscope [id number] endoscope was introduced through the anus and advanced to the cecum, which was identified by both the appendix and ileocecal valve, without limitations.  The quality of the prep was excellent, using MoviPrep.  The instrument was then slowly withdrawn as the colon was fully examined. <<PROCEDUREIMAGES>>  FINDINGS:  Arteriovenous malformations were seen in the in the right colon. 5 seen - IC valve (two), ascending colon (1) and transverse (2). largest 4 mm, others 2-3 mm. All ablated with APC setting of 40 and 1 L flow.  ? if also portal colopathy.Moderate diverticulosis was found in the sigmoid colon.  This was otherwise a normal examination of the colon.   Retroflexed views in the rectum revealed internal hemorrhoids with rectal varices.  Scope was withdrawn and the procedure completed. 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 (Some ? of possible portal colopathy) 2) Moderate diverticulosis in the sigmoid colon 3) Internal hemorrhoids and rectal varices 4) Otherwise normal examination RECOMMENDATIONS: 1) Try for MAC sedationin future as he had  discomfort in left colon with scope passage. 2) My office will contact him and schedule liver and abdominal ultrasound with dopplers- portal hypertension, ? ascites, ? cirrhosis 3) My office will also arrange follow-up visit with me for 1 month approximately (late March).  REPEAT EXAM:  Will recommend this pending further chart review. He does have hx adenomas but co-morbidities may change routine repet recommendation  Iva Boop, MD, [facility]  CC:  Karie Schwalbe, MDGraham Para March, MDThe Patient  n. Rosalie Doctor:   Iva Boop at 09/11/2011 11:14 AM  Adela Lank, [phone]

## 2011-09-11 NOTE — Op Note (Signed)
[facility] [address] [place], [place]  [address]  ENDOSCOPY PROCEDURE REPORT  PATIENT:  Billy [name], Billy [name]  MR#:  [id number] BIRTHDATE:  04/13/1946, 65 yrs. old  GENDER:  male  ENDOSCOPIST:  Iva Boop, MD, [facility] Referred by:  Crawford Givens, M.D.  PROCEDURE DATE:  09/11/2011 PROCEDURE:  EGD with biopsy for H. pylori [id number] ASA CLASS:  Class III INDICATIONS:  hematochezia history of esophageal varices  MEDICATIONS:   Benadryl 12.5 mg IV, Fentanyl 50 mcg IV, Versed 5 mg IV TOPICAL ANESTHETIC:  Cetacaine Spray  DESCRIPTION OF PROCEDURE:   After the risks benefits and alternatives of the procedure were thoroughly explained, informed consent was obtained.  The Pentax Gastroscope [id number] endoscope was introduced through the mouth and advanced to the second portion of the duodenum, without limitations.  The instrument was slowly withdrawn as the mucosa was fully examined. <<PROCEDUREIMAGES>>  Grade II varices were found in the mid esophagus. Three columns, two 1+ and one 2+. No stigmata of hemorrhage, extend from mid-distal esophagus.  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  An ulcer was found in the antrum. Small (5-6 mm) and clean-based. A biopsy for H. pylori was taken.  Duodenitis was found in the bulb and descending duodenum. at D1/D2 junction - edema and erythema.  Otherwise the examination was normal. Retroflexed views revealed Retroflexion exam demonstrated findings as previously described.    The scope was then withdrawn from the patient and the procedure completed.  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  To follow-up  varices  Iva Boop, MD, [facility]  CC:  Karie Schwalbe, MD, Charna Elizabeth. Para March, MD and The Patient  n. eSIGNED:   Iva Boop at 09/11/2011 10:59 AM  Adela Lank, [phone]

## 2011-09-11 NOTE — H&P (View-Only) (Signed)
Subjective:    Patient ID: Billy [name], male    DOB: 09/07/1945, 65 y.o.   MRN: [id number]  HPI This 65-year-old man was is in his usual state of health until  one week ago. He was at the store, he felt lightheaded and he proceeded to have bloody bowel movements. He had several that day, red in color without melena. He said bright red to dark. Over time it dissipated over the next couple of days or so and stopped by 2 days ago. He was evaluated by primary care and found to have a hemoglobin of 10 and a heme positive stool at that time. It was actually bloody on the rectal exam. He had no abdominal pain with this. It was of sudden onset and it resolved spontaneously as noted. He is taking no anticoagulants or antiplatelet agents. No anti-inflammatories used. He had no nausea or vomiting.  His history is notable for previous colonoscopies with small polyps, most recently in 2007 with no polyps but diverticulosis in the left colon. He also had an upper GI bleed in 2006, and records review shows that he had esophagitis and 1+ varices and was referred to alcoholics anonymous at that time. He currently admits to drinking a few times a week at least, one or 2 beers or mixed drinks. He says he does not drink every day.  He feels completely recovered at this time he tells me, with no abdominal pain lightheadedness or weakness.  No Known Allergies Outpatient Prescriptions Prior to Visit  Medication Sig Dispense Refill  . atenolol (TENORMIN) 25 MG tablet Take 1 tablet (25 mg total) by mouth daily.  90 tablet  3  . HYDROcodone-acetaminophen (NORCO) 5-325 MG per tablet Take 1 tablet by mouth 3 (three) times daily as needed.  90 tablet  0  . mirtazapine (REMERON) 45 MG tablet Take 45 mg by mouth at bedtime.        . Multiple Vitamin (MULTIVITAMIN) tablet Take 1 tablet by mouth daily.      . NON FORMULARY Mega Red once daily.      . omeprazole (PRILOSEC) 40 MG capsule Take 1 capsule (40 mg total) by mouth  daily.  90 capsule  3  . sildenafil (VIAGRA) 50 MG tablet Take 50 mg by mouth. Take one by mouth as directed        . vitamin C (ASCORBIC ACID) 500 MG tablet Take 500 mg by mouth daily.       Past Medical History  Diagnosis Date  . Depression     mild at most  . Diverticulosis of colon   . GERD (gastroesophageal reflux disease)   . Hypertension   . Sleep disturbance   . DDD (degenerative disc disease)   . Erectile dysfunction   . GI hemorrhage 2006    esophagitis and 1+ variced  . Alcoholism    Past Surgical History  Procedure Date  . Varicocele repair 1980's  . Knee arthroscopy 2008    Right  . Knee arthroscopy 2008    Left  . Colonoscopy 2001, 200411/02/2006  . Esophagogastroduodenoscopy 2006   History   Social History  . Marital Status: Married    Spouse Name: N/A    Number of Children: 1  . Years of Education: N/A   Occupational History  . Retired     mostly from retail   Social History Main Topics  . Smoking status: Former Smoker  . Smokeless tobacco: Never Used  . Alcohol Use: Yes       occ  . Drug Use: No  . Sexually Active: None   Other Topics Concern  . None   Social History Narrative   Married- 2nd1 son (adopted) from previous marriageStill looking from some work Using nicorette gum   Family History  Problem Relation Age of Onset  . Stroke Mother   . Diabetes Mother         Review of Systems This is positive for chronic back pain control with narcotics. All other review of systems negative or as mentioned in the history of present illness    Objective:   Physical Exam General:  Well-developed, well-nourished and in no acute distress Eyes:  Anicteric. + anisocoria Left > right  ENT:   Mouth and posterior pharynx free of lesions.  Neck:   supple w/o thyromegaly or mass.  Lungs: Clear to auscultation bilaterally. Heart:  S1S2, no rubs, murmurs, gallops. Abdomen:  soft, non-tender, no hepatosplenomegaly, hernia, or mass and BS+. It is  protuberant with prominent veins in wall, ? Ascites possible Rectal: Brown and heme negative stool Lymph:  no cervical or supraclavicular adenopathy. Extremities:   Trace bilateral LE edema, pretibial, + varicosities Skin   no rash. Psych:  appropriate mood and  Affect.   Data Reviewed: Lab Results  Component Value Date   WBC 5.7 09/05/2011   HGB 10.4* 09/05/2011   HCT 31.4* 09/05/2011   MCV 102.3* 09/05/2011   PLT 138.0* 09/05/2011     Chemistry      Component Value Date/Time   NA 143 08/09/2011 1239   K 4.5 08/09/2011 1239   CL 103 08/09/2011 1239   CO2 31 08/09/2011 1239   BUN 9 08/09/2011 1239   CREATININE 0.6 08/09/2011 1239      Component Value Date/Time   CALCIUM 8.9 08/09/2011 1239   ALKPHOS 147* 08/09/2011 1239   AST 51* 08/09/2011 1239   ALT 30 08/09/2011 1239   BILITOT 1.1 08/09/2011 1239      Old hospitalization records, colonoscopy and pathology reports as well as recent primary care notes and labs.        Assessment & Plan:  See the problem oriented charting as well. Overall I suspect he had bleeding from diverticulosis but we need to look for the cause. I have also uncovered what I think is alcoholic liver disease which could be related to the bleeding but probably not.  

## 2011-09-11 NOTE — Interval H&P Note (Signed)
History and Physical Interval Note:  ABUSE, ALCOHOL, UNSPECIFIED - Stan Head, MD 09/05/2011 5:48 PM Addendum  This man probably has alcoholic liver disease. He may even have cirrhosis. The thrombocytopenia, or tumor in abdomen with prominent veins are suggestive of complicated alcoholic liver disease and probable cirrhosis. He has chronic elevation of his alkaline phosphatase and transaminases. He is still drinking. I have explained to him that he should not be using any alcohol as it increases risk of ongoing and progressive liver damage. He was advised to stop. He also has a macrocytosis which could be from alcohol or distal liver disease itself.  Further plans pending the endoscopic evaluations. He will probably end up needing ultrasound as well. Previous Version  Hemorrhage of gastrointestinal tract, unspecified - Stan Head, MD 09/05/2011 5:49 PM Addendum  He had painless hematochezia. Clinical scenario most consistent with diverticular bleeding. This appears to be resolved. Exact cause unknown so we'll pursue upper endoscopy and colonoscopy. Risks benefits and indications of the procedures explained the patient understands and agrees to proceed. With his history of esophageal varices, it is possible he had bleeding from these though without melena or hematemesis seems very unlikely to me. I have instructed him to go to the emergency room if he has further bleeding.  Note the ProTime was normal today. Previous Version  Acute posthemorrhagic anemia - Stan Head, MD 09/05/2011 5:49 PM Signed  Hemoglobin stable in the 10 range.     09/11/2011 9:58 AM  Camie Patience  has presented today for surgery, with the diagnosis of hematochezia  The various methods of treatment have been discussed with the patient and family. After consideration of risks, benefits and other options for treatment, the patient has consented to  Procedure(s) (LRB): ESOPHAGOGASTRODUODENOSCOPY (EGD) (N/A) COLONOSCOPY  (N/A) as a surgical intervention .  The patients' history has been reviewed, patient examined, no change in status, stable for surgery.  I have reviewed the patients' chart and labs.  Questions were answered to the patient's satisfaction.     Stan Head

## 2011-09-12 ENCOUNTER — Encounter (HOSPITAL_COMMUNITY): Payer: Self-pay | Admitting: Internal Medicine

## 2011-09-12 LAB — CLOTEST (H. PYLORI), BIOPSY: Helicobacter screen: NEGATIVE

## 2011-09-12 NOTE — Progress Notes (Signed)
Quick Note:  H. Pylori negative See procedure report re needs for [place] and appointment ______

## 2011-09-13 ENCOUNTER — Telehealth: Payer: Self-pay

## 2011-09-13 DIAGNOSIS — K766 Portal hypertension: Secondary | ICD-10-CM

## 2011-09-13 NOTE — Telephone Encounter (Signed)
Patient advised of US doppler scheduled scheduled at [address], to arrive on 09/18/11 7:45 and be NPO after midnight.  He is also scheduled for REV for 10/21/11 11:30

## 2011-09-18 ENCOUNTER — Ambulatory Visit
Admission: RE | Admit: 2011-09-18 | Discharge: 2011-09-18 | Disposition: A | Discharge status: Home / Self Care | Payer: 59 | Source: Ambulatory Visit | Attending: Internal Medicine | Admitting: Internal Medicine

## 2011-09-18 DIAGNOSIS — K766 Portal hypertension: Secondary | ICD-10-CM

## 2011-09-20 ENCOUNTER — Other Ambulatory Visit: Payer: Self-pay | Admitting: Internal Medicine

## 2011-09-20 ENCOUNTER — Other Ambulatory Visit: Payer: Self-pay | Admitting: *Deleted

## 2011-09-20 NOTE — Telephone Encounter (Signed)
Faxed refill request   

## 2011-09-22 MED ORDER — HYDROCODONE-ACETAMINOPHEN 5-325 MG PO TABS: 1.0000 | ORAL_TABLET | Freq: Three times a day (TID) | ORAL | Status: DC | PRN

## 2011-09-22 NOTE — Telephone Encounter (Signed)
Please call in

## 2011-09-23 NOTE — Telephone Encounter (Signed)
Medication phoned to pharmacy.  

## 2011-09-24 ENCOUNTER — Other Ambulatory Visit: Payer: Self-pay

## 2011-09-24 ENCOUNTER — Encounter: Payer: Self-pay | Admitting: Internal Medicine

## 2011-09-24 ENCOUNTER — Other Ambulatory Visit (INDEPENDENT_AMBULATORY_CARE_PROVIDER_SITE_OTHER): Payer: 59

## 2011-09-24 DIAGNOSIS — K7030 Alcoholic cirrhosis of liver without ascites: Secondary | ICD-10-CM | POA: Insufficient documentation

## 2011-09-24 DIAGNOSIS — K7460 Unspecified cirrhosis of liver: Secondary | ICD-10-CM

## 2011-09-24 HISTORY — DX: Alcoholic cirrhosis of liver without ascites: K70.30

## 2011-09-24 NOTE — Progress Notes (Signed)
Quick Note:  Let him know the [place] confirms cirrhosis- think from alcohol Needs some other lab tests to evaluate other causes and check immunity for infectious hepatitis  He needs: 1) Hep B surface antigen 2) Hep A total antibody 3) AFP 4) Ferritin 5) ANA 6) AMA  ______

## 2011-09-25 LAB — ANA: Anti Nuclear Antibody(ANA): NEGATIVE

## 2011-09-25 LAB — HEPATITIS A ANTIBODY, TOTAL: Hep A Total Ab: NEGATIVE

## 2011-10-21 ENCOUNTER — Encounter: Payer: Self-pay | Admitting: Internal Medicine

## 2011-10-21 ENCOUNTER — Ambulatory Visit (INDEPENDENT_AMBULATORY_CARE_PROVIDER_SITE_OTHER): Payer: 59 | Admitting: Internal Medicine

## 2011-10-21 ENCOUNTER — Other Ambulatory Visit (INDEPENDENT_AMBULATORY_CARE_PROVIDER_SITE_OTHER): Payer: 59

## 2011-10-21 ENCOUNTER — Other Ambulatory Visit: Payer: Self-pay | Admitting: Internal Medicine

## 2011-10-21 VITALS — BP 162/88 | HR 120 | Ht 75.0 in | Wt 186.4 lb

## 2011-10-21 DIAGNOSIS — D62 Acute posthemorrhagic anemia: Secondary | ICD-10-CM

## 2011-10-21 DIAGNOSIS — R188 Other ascites: Secondary | ICD-10-CM

## 2011-10-21 DIAGNOSIS — K5520 Angiodysplasia of colon without hemorrhage: Secondary | ICD-10-CM

## 2011-10-21 DIAGNOSIS — K7030 Alcoholic cirrhosis of liver without ascites: Secondary | ICD-10-CM

## 2011-10-21 LAB — CBC WITH DIFFERENTIAL/PLATELET
Basophils Absolute: 0 10*3/uL (ref 0.0–0.1)
Eosinophils Absolute: 0.1 10*3/uL (ref 0.0–0.7)
MCHC: 32.3 g/dL (ref 30.0–36.0)
MCV: 87.2 fl (ref 78.0–100.0)
Monocytes Absolute: 0.5 10*3/uL (ref 0.1–1.0)
Neutrophils Relative %: 73.9 % (ref 43.0–77.0)
Platelets: 127 10*3/uL — ABNORMAL LOW (ref 150.0–400.0)

## 2011-10-21 MED ORDER — SPIRONOLACTONE 25 MG PO TABS: 25.0000 mg | ORAL_TABLET | Freq: Every day | ORAL | Status: DC

## 2011-10-21 MED ORDER — FERROUS SULFATE 325 (65 FE) MG PO TBEC: 325.0000 mg | DELAYED_RELEASE_TABLET | Freq: Two times a day (BID) | ORAL | Status: DC

## 2011-10-21 NOTE — Assessment & Plan Note (Signed)
No further bleeding reported. I will follow up a CBC today. His ferritin was low so there's a chronic component to this as well and will start him on iron therapy. Followup CBC in one month.

## 2011-10-21 NOTE — Assessment & Plan Note (Signed)
He had some on his ultrasound which was focused on the liver, physical exam suggests a moderate amount. Start Spironolactone 25 mg daily.

## 2011-10-21 NOTE — Assessment & Plan Note (Signed)
We reviewed the nature of this problem today. If he stops alcohol completely things may stabilize. Will start Spironolactone 25 mg daily for ascites and edema. Low sodium diet is recommended as well. Information provided on both. He needs hepatitis A vaccine and I will check hepatitis B surface antibody today. He says he had a pneumonia vaccine within the last couple of years. Return in 4 months. Lab followup in one month.

## 2011-10-21 NOTE — Patient Instructions (Addendum)
Your physician has requested that you go to the basement for the following lab work before leaving today: CBC, Hepatitis B surface Antibody.  In a month please come back and go to the basement for labs:  CBC, BMET  We have sent the following medications to your pharmacy for you to pick up at your convenience: Spironolactone, Ferrous Sulfate  You have been given a handout for reduced sodium diet and information on cirrhosis.  Follow-Up in 4 months.

## 2011-10-21 NOTE — Progress Notes (Signed)
  Subjective:    Patient ID: Billy [name], male    DOB: Mar 26, 1946, 66 y.o.   MRN: [id number]  HPI This 66 year old white man returns. I have met him recently when he presented with a history of hematochezia and presumed lower GI bleeding. He underwent colonoscopy and upper endoscopy. 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 There was a small antral ulcer, duodenitis and he was H. pylori negative. 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 I suspected that those are the source of his bleeding. He also had hemorrhoids and rectal varices as well as diverticulosis.  He has not had recurrent bleeding. He has given up alcohol though he says he tested himself in the past couple of weeks and only drank a thorough bottle of beer and did not finish it. He feels like he can avoid alcohol on his own.  He has not noted any significant swelling, does not think his weight is going upwards abdominal girth is increasing.  Wt Readings from Last 3 Encounters:  10/21/11 186 lb 6.4 oz (84.55 kg)  09/05/11 180 lb 3.2 oz (81.738 kg)  09/02/11 181 lb 12 oz (82.441 kg)   Medications, allergies, past medical history, past surgical history, family history and social history are reviewed and updated in the EMR.  Review of Systems He has a right hydrocele, and surgery has been recommended for this    Objective:   Physical Exam General:  NAD Eyes:   anicteric Lungs:  clear Heart:  S1S2 no rubs, murmurs or gallops Abdomen:  soft and nontender, BS+ prominent veins in the abdominal wall, abdomen is distended with a moderate amount of ascites Ext:   Trace bilateral ankle and lower pretibial edema    Data Reviewed:  Lab studies, endoscopy colonoscopy findings and pathology as well as ultrasound        Assessment & Plan:

## 2011-10-22 ENCOUNTER — Other Ambulatory Visit: Payer: Self-pay | Admitting: *Deleted

## 2011-10-22 LAB — HEPATITIS B SURFACE ANTIGEN: Hepatitis B Surface Ag: NEGATIVE

## 2011-10-22 LAB — HEPATITIS B SURFACE ANTIBODY,QUALITATIVE: Hep B S Ab: NEGATIVE

## 2011-10-22 NOTE — Telephone Encounter (Signed)
Faxed refill request from [facility] [place].  Last filled 09/25/11.

## 2011-10-23 ENCOUNTER — Other Ambulatory Visit: Payer: Self-pay | Admitting: *Deleted

## 2011-10-23 MED ORDER — HYDROCODONE-ACETAMINOPHEN 5-325 MG PO TABS: 1.0000 | ORAL_TABLET | Freq: Three times a day (TID) | ORAL | Status: DC | PRN

## 2011-10-23 NOTE — Telephone Encounter (Signed)
Okay #90 x 0 

## 2011-10-23 NOTE — Telephone Encounter (Signed)
Request already submitted.

## 2011-10-23 NOTE — Telephone Encounter (Signed)
Pt called requesting Hydrocodone APAP 5-325 mg #90 x 0 be called in now. I called to [facility] [place] now and pt notified.

## 2011-10-24 ENCOUNTER — Encounter: Payer: Self-pay | Admitting: Internal Medicine

## 2011-10-24 ENCOUNTER — Other Ambulatory Visit: Payer: Self-pay

## 2011-10-24 NOTE — Telephone Encounter (Signed)
error 

## 2011-10-24 NOTE — Progress Notes (Signed)
Patient advised of Twinrix will come for #1 on 10/28/11, paracentesis on 10/28/11 11:00.  He is advised of all the above

## 2011-10-24 NOTE — Progress Notes (Signed)
Quick Note:  Hgb a little better - stay on ferrous sulfate  Will need hepatitis A and B vaccination  Also want him to have an ultrasound-guided paracentesis - diagnostic - did not discuss at office visit but abdominal fluid should be analyzed so let him know and schedule if he agrees  Studies on ascitic fluid needed are: 1) albumin 2) cell count and diff 3) culture 4) cytology ______

## 2011-10-28 ENCOUNTER — Ambulatory Visit (INDEPENDENT_AMBULATORY_CARE_PROVIDER_SITE_OTHER): Payer: 59 | Admitting: Internal Medicine

## 2011-10-28 ENCOUNTER — Other Ambulatory Visit: Payer: Self-pay | Admitting: Internal Medicine

## 2011-10-28 ENCOUNTER — Ambulatory Visit (HOSPITAL_COMMUNITY)
Admission: RE | Admit: 2011-10-28 | Discharge: 2011-10-28 | Disposition: A | Discharge status: Home / Self Care | Payer: 59 | Source: Ambulatory Visit | Attending: Internal Medicine | Admitting: Internal Medicine

## 2011-10-28 DIAGNOSIS — Z23 Encounter for immunization: Secondary | ICD-10-CM

## 2011-10-28 DIAGNOSIS — R188 Other ascites: Secondary | ICD-10-CM | POA: Insufficient documentation

## 2011-10-28 DIAGNOSIS — K7030 Alcoholic cirrhosis of liver without ascites: Secondary | ICD-10-CM

## 2011-10-28 HISTORY — PX: US THORA/PARACENTESIS: HXRAD580

## 2011-10-28 NOTE — Progress Notes (Signed)
Quick Note:  Ok - let him know I want him to stay on meds and see me as planned ______

## 2011-10-28 NOTE — Progress Notes (Signed)
Patient ID: Billy [name], male   DOB: 01/24/1946, 66 y.o.   MRN: [id number] Patient presented today for US guided paracentesis. On limited US abdomen today in all four quadrants there is only trace amount of ascites noted. Paracentesis was not performed. Patient aware of findings.

## 2011-11-04 ENCOUNTER — Ambulatory Visit (INDEPENDENT_AMBULATORY_CARE_PROVIDER_SITE_OTHER): Payer: 59 | Admitting: Internal Medicine

## 2011-11-04 DIAGNOSIS — Z23 Encounter for immunization: Secondary | ICD-10-CM

## 2011-11-04 NOTE — Progress Notes (Signed)
Patient tolerated procedure well.

## 2011-11-11 ENCOUNTER — Ambulatory Visit (INDEPENDENT_AMBULATORY_CARE_PROVIDER_SITE_OTHER): Payer: 59 | Admitting: Internal Medicine

## 2011-11-11 DIAGNOSIS — K7030 Alcoholic cirrhosis of liver without ascites: Secondary | ICD-10-CM

## 2011-11-19 ENCOUNTER — Other Ambulatory Visit (INDEPENDENT_AMBULATORY_CARE_PROVIDER_SITE_OTHER): Payer: 59

## 2011-11-19 DIAGNOSIS — D62 Acute posthemorrhagic anemia: Secondary | ICD-10-CM

## 2011-11-19 DIAGNOSIS — R188 Other ascites: Secondary | ICD-10-CM

## 2011-11-19 DIAGNOSIS — K7030 Alcoholic cirrhosis of liver without ascites: Secondary | ICD-10-CM

## 2011-11-19 LAB — CBC WITH DIFFERENTIAL/PLATELET
Basophils Absolute: 0 10*3/uL (ref 0.0–0.1)
Eosinophils Relative: 1.6 % (ref 0.0–5.0)
HCT: 39.6 % (ref 39.0–52.0)
Lymphocytes Relative: 19.2 % (ref 12.0–46.0)
Monocytes Relative: 8.7 % (ref 3.0–12.0)
Neutrophils Relative %: 69.8 % (ref 43.0–77.0)
Platelets: 125 10*3/uL — ABNORMAL LOW (ref 150.0–400.0)
WBC: 5.4 10*3/uL (ref 4.5–10.5)

## 2011-11-19 LAB — BASIC METABOLIC PANEL
CO2: 27 mEq/L (ref 19–32)
Glucose, Bld: 129 mg/dL — ABNORMAL HIGH (ref 70–99)
Potassium: 5 mEq/L (ref 3.5–5.1)
Sodium: 139 mEq/L (ref 135–145)

## 2011-11-19 NOTE — Progress Notes (Signed)
Quick Note:  Hgb almost normal Other labs ok/stable No changes in meds Keep f/u as planned (should be seeing me in July) ______

## 2011-11-21 ENCOUNTER — Other Ambulatory Visit: Payer: Self-pay | Admitting: *Deleted

## 2011-11-21 MED ORDER — HYDROCODONE-ACETAMINOPHEN 5-325 MG PO TABS: 1.0000 | ORAL_TABLET | Freq: Three times a day (TID) | ORAL | Status: DC | PRN

## 2011-11-21 NOTE — Telephone Encounter (Signed)
Pt called going out of town tomorrow and would like to pick up med tonight. Pt can be reached at [phone].

## 2011-11-21 NOTE — Telephone Encounter (Signed)
Okay #90 x 0 

## 2011-11-21 NOTE — Telephone Encounter (Signed)
Medication phoned to pharmacy.  

## 2011-11-29 ENCOUNTER — Other Ambulatory Visit: Payer: Self-pay | Admitting: Internal Medicine

## 2011-12-17 ENCOUNTER — Other Ambulatory Visit: Payer: Self-pay | Admitting: Internal Medicine

## 2011-12-17 MED ORDER — SPIRONOLACTONE 25 MG PO TABS: 25.0000 mg | ORAL_TABLET | Freq: Every day | ORAL | Status: DC

## 2011-12-17 NOTE — Telephone Encounter (Signed)
Changed Rx to 90 day supply instead of 30 day supply for insurance reasons, [facility].

## 2011-12-23 ENCOUNTER — Other Ambulatory Visit: Payer: Self-pay | Admitting: *Deleted

## 2011-12-23 MED ORDER — HYDROCODONE-ACETAMINOPHEN 5-325 MG PO TABS: 1.0000 | ORAL_TABLET | Freq: Three times a day (TID) | ORAL | Status: DC | PRN

## 2011-12-23 NOTE — Telephone Encounter (Signed)
rx called into pharmacy

## 2011-12-23 NOTE — Telephone Encounter (Signed)
Okay #90 x 0 

## 2012-01-06 ENCOUNTER — Encounter (HOSPITAL_COMMUNITY): Payer: Self-pay | Admitting: *Deleted

## 2012-01-06 ENCOUNTER — Emergency Department (HOSPITAL_COMMUNITY): Payer: 59

## 2012-01-06 ENCOUNTER — Inpatient Hospital Stay (HOSPITAL_COMMUNITY)
Admission: EM | Admit: 2012-01-06 | Discharge: 2012-01-10 | DRG: 391 | Disposition: A | Discharge status: Home / Self Care | Payer: 59 | Attending: Internal Medicine | Admitting: Internal Medicine

## 2012-01-06 DIAGNOSIS — D696 Thrombocytopenia, unspecified: Secondary | ICD-10-CM | POA: Diagnosis present

## 2012-01-06 DIAGNOSIS — K5730 Diverticulosis of large intestine without perforation or abscess without bleeding: Secondary | ICD-10-CM | POA: Diagnosis present

## 2012-01-06 DIAGNOSIS — G8929 Other chronic pain: Secondary | ICD-10-CM | POA: Diagnosis present

## 2012-01-06 DIAGNOSIS — R188 Other ascites: Secondary | ICD-10-CM

## 2012-01-06 DIAGNOSIS — A045 Campylobacter enteritis: Secondary | ICD-10-CM

## 2012-01-06 DIAGNOSIS — R195 Other fecal abnormalities: Secondary | ICD-10-CM

## 2012-01-06 DIAGNOSIS — Z8719 Personal history of other diseases of the digestive system: Secondary | ICD-10-CM

## 2012-01-06 DIAGNOSIS — K5520 Angiodysplasia of colon without hemorrhage: Secondary | ICD-10-CM | POA: Diagnosis present

## 2012-01-06 DIAGNOSIS — M549 Dorsalgia, unspecified: Secondary | ICD-10-CM

## 2012-01-06 DIAGNOSIS — E876 Hypokalemia: Secondary | ICD-10-CM | POA: Diagnosis present

## 2012-01-06 DIAGNOSIS — K219 Gastro-esophageal reflux disease without esophagitis: Secondary | ICD-10-CM

## 2012-01-06 DIAGNOSIS — K5289 Other specified noninfective gastroenteritis and colitis: Secondary | ICD-10-CM

## 2012-01-06 DIAGNOSIS — M5137 Other intervertebral disc degeneration, lumbosacral region: Secondary | ICD-10-CM | POA: Diagnosis present

## 2012-01-06 DIAGNOSIS — A09 Infectious gastroenteritis and colitis, unspecified: Principal | ICD-10-CM | POA: Diagnosis present

## 2012-01-06 DIAGNOSIS — R933 Abnormal findings on diagnostic imaging of other parts of digestive tract: Secondary | ICD-10-CM

## 2012-01-06 DIAGNOSIS — F1020 Alcohol dependence, uncomplicated: Secondary | ICD-10-CM | POA: Diagnosis present

## 2012-01-06 DIAGNOSIS — M47817 Spondylosis without myelopathy or radiculopathy, lumbosacral region: Secondary | ICD-10-CM | POA: Diagnosis present

## 2012-01-06 DIAGNOSIS — F329 Major depressive disorder, single episode, unspecified: Secondary | ICD-10-CM

## 2012-01-06 DIAGNOSIS — K7030 Alcoholic cirrhosis of liver without ascites: Secondary | ICD-10-CM

## 2012-01-06 DIAGNOSIS — K7460 Unspecified cirrhosis of liver: Secondary | ICD-10-CM

## 2012-01-06 DIAGNOSIS — K649 Unspecified hemorrhoids: Secondary | ICD-10-CM

## 2012-01-06 DIAGNOSIS — I8500 Esophageal varices without bleeding: Secondary | ICD-10-CM | POA: Diagnosis present

## 2012-01-06 DIAGNOSIS — E871 Hypo-osmolality and hyponatremia: Secondary | ICD-10-CM | POA: Diagnosis present

## 2012-01-06 DIAGNOSIS — D62 Acute posthemorrhagic anemia: Secondary | ICD-10-CM

## 2012-01-06 DIAGNOSIS — I81 Portal vein thrombosis: Secondary | ICD-10-CM

## 2012-01-06 DIAGNOSIS — R109 Unspecified abdominal pain: Secondary | ICD-10-CM | POA: Diagnosis present

## 2012-01-06 DIAGNOSIS — K766 Portal hypertension: Secondary | ICD-10-CM | POA: Diagnosis present

## 2012-01-06 DIAGNOSIS — K648 Other hemorrhoids: Secondary | ICD-10-CM

## 2012-01-06 DIAGNOSIS — F1010 Alcohol abuse, uncomplicated: Secondary | ICD-10-CM | POA: Diagnosis present

## 2012-01-06 DIAGNOSIS — Z860100 Personal history of colon polyps, unspecified: Secondary | ICD-10-CM

## 2012-01-06 DIAGNOSIS — I868 Varicose veins of other specified sites: Secondary | ICD-10-CM | POA: Diagnosis present

## 2012-01-06 DIAGNOSIS — G479 Sleep disorder, unspecified: Secondary | ICD-10-CM

## 2012-01-06 DIAGNOSIS — I10 Essential (primary) hypertension: Secondary | ICD-10-CM | POA: Diagnosis present

## 2012-01-06 DIAGNOSIS — K55059 Acute (reversible) ischemia of intestine, part and extent unspecified: Secondary | ICD-10-CM | POA: Diagnosis present

## 2012-01-06 DIAGNOSIS — Z79899 Other long term (current) drug therapy: Secondary | ICD-10-CM

## 2012-01-06 DIAGNOSIS — M51379 Other intervertebral disc degeneration, lumbosacral region without mention of lumbar back pain or lower extremity pain: Secondary | ICD-10-CM | POA: Diagnosis present

## 2012-01-06 DIAGNOSIS — F17200 Nicotine dependence, unspecified, uncomplicated: Secondary | ICD-10-CM | POA: Diagnosis present

## 2012-01-06 LAB — PHOSPHORUS: Phosphorus: 2.3 mg/dL (ref 2.3–4.6)

## 2012-01-06 LAB — URINALYSIS, ROUTINE W REFLEX MICROSCOPIC
Glucose, UA: NEGATIVE mg/dL
Leukocytes, UA: NEGATIVE
Specific Gravity, Urine: 1.029 (ref 1.005–1.030)
pH: 6 (ref 5.0–8.0)

## 2012-01-06 LAB — LIPASE, BLOOD: Lipase: 75 U/L — ABNORMAL HIGH (ref 11–59)

## 2012-01-06 LAB — CBC
MCV: 88.7 fL (ref 78.0–100.0)
Platelets: 121 10*3/uL — ABNORMAL LOW (ref 150–400)
RDW: 17.8 % — ABNORMAL HIGH (ref 11.5–15.5)
WBC: 9.5 10*3/uL (ref 4.0–10.5)

## 2012-01-06 LAB — DIFFERENTIAL
Basophils Absolute: 0 10*3/uL (ref 0.0–0.1)
Eosinophils Relative: 0 % (ref 0–5)
Lymphocytes Relative: 8 % — ABNORMAL LOW (ref 12–46)

## 2012-01-06 LAB — COMPREHENSIVE METABOLIC PANEL
ALT: 24 U/L (ref 0–53)
AST: 40 U/L — ABNORMAL HIGH (ref 0–37)
CO2: 23 mEq/L (ref 19–32)
Calcium: 9.1 mg/dL (ref 8.4–10.5)
GFR calc non Af Amer: >90 mL/min (ref >90)
Sodium: 128 mEq/L — ABNORMAL LOW (ref 135–145)
Total Protein: 7.5 g/dL (ref 6.0–8.3)

## 2012-01-06 LAB — CARDIAC PANEL(CRET KIN+CKTOT+MB+TROPI): Total CK: 37 U/L (ref 7–232)

## 2012-01-06 LAB — SODIUM, URINE, RANDOM: Sodium, Ur: 10 mEq/L

## 2012-01-06 MED ORDER — IOHEXOL 300 MG/ML  SOLN
100.0000 mL | Freq: Once | INTRAMUSCULAR | Status: AC | PRN
  Administered 2012-01-06: 100 mL via INTRAVENOUS

## 2012-01-06 MED ORDER — ADULT MULTIVITAMIN W/MINERALS CH
1.0000 | ORAL_TABLET | Freq: Every day | ORAL | Status: DC
  Administered 2012-01-06 – 2012-01-10 (×5): 1 via ORAL
  Filled 2012-01-06 (×5): qty 1

## 2012-01-06 MED ORDER — FERROUS SULFATE 325 (65 FE) MG PO TBEC: 325.0000 mg | DELAYED_RELEASE_TABLET | Freq: Two times a day (BID) | ORAL | Status: DC

## 2012-01-06 MED ORDER — VITAMIN D3 25 MCG (1000 UT) PO CAPS
1.0000 | ORAL_CAPSULE | Freq: Every day | ORAL | Status: DC
  Administered 2012-01-06: 1000 [IU] via ORAL

## 2012-01-06 MED ORDER — HYDROCODONE-ACETAMINOPHEN 5-325 MG PO TABS
1.0000 | ORAL_TABLET | ORAL | Status: DC | PRN
  Administered 2012-01-06 – 2012-01-10 (×18): 2 via ORAL
  Filled 2012-01-06 (×7): qty 2
  Filled 2012-01-06: qty 1
  Filled 2012-01-06 (×4): qty 2
  Filled 2012-01-06: qty 1
  Filled 2012-01-06 (×7): qty 2

## 2012-01-06 MED ORDER — SPIRONOLACTONE 25 MG PO TABS
25.0000 mg | ORAL_TABLET | Freq: Every day | ORAL | Status: DC
  Administered 2012-01-06: 25 mg via ORAL
  Filled 2012-01-06 (×2): qty 1

## 2012-01-06 MED ORDER — SODIUM CHLORIDE 0.9 % IV BOLUS (SEPSIS)
500.0000 mL | Freq: Once | INTRAVENOUS | Status: AC
  Administered 2012-01-06: 500 mL via INTRAVENOUS

## 2012-01-06 MED ORDER — FLEET ENEMA 7-19 GM/118ML RE ENEM
1.0000 | ENEMA | Freq: Once | RECTAL | Status: AC
  Administered 2012-01-07: 1 via RECTAL
  Filled 2012-01-06: qty 1

## 2012-01-06 MED ORDER — LORAZEPAM 1 MG PO TABS
1.0000 mg | ORAL_TABLET | Freq: Four times a day (QID) | ORAL | Status: AC | PRN
  Administered 2012-01-08: 1 mg via ORAL
  Filled 2012-01-06: qty 1

## 2012-01-06 MED ORDER — ONDANSETRON HCL 4 MG PO TABS: 4.0000 mg | ORAL_TABLET | Freq: Four times a day (QID) | ORAL | Status: DC | PRN

## 2012-01-06 MED ORDER — HEPARIN BOLUS VIA INFUSION: 3000.0000 [IU] | Freq: Once | INTRAVENOUS | Status: DC

## 2012-01-06 MED ORDER — ONE-DAILY MULTI VITAMINS PO TABS: 1.0000 | ORAL_TABLET | Freq: Every day | ORAL | Status: DC

## 2012-01-06 MED ORDER — VITAMIN B-1 100 MG PO TABS
100.0000 mg | ORAL_TABLET | Freq: Every day | ORAL | Status: DC
Start: 2012-01-06 — End: 2012-01-10
  Administered 2012-01-06 – 2012-01-10 (×5): 100 mg via ORAL
  Filled 2012-01-06 (×5): qty 1

## 2012-01-06 MED ORDER — VITAMIN D3 25 MCG (1000 UNIT) PO TABS
1000.0000 [IU] | ORAL_TABLET | Freq: Every day | ORAL | Status: DC
  Administered 2012-01-06 – 2012-01-10 (×5): 1000 [IU] via ORAL
  Filled 2012-01-06 (×5): qty 1

## 2012-01-06 MED ORDER — GUAIFENESIN-DM 100-10 MG/5ML PO SYRP: 5.0000 mL | ORAL_SOLUTION | ORAL | Status: DC | PRN

## 2012-01-06 MED ORDER — HYDROMORPHONE HCL PF 1 MG/ML IJ SOLN
1.0000 mg | Freq: Once | INTRAMUSCULAR | Status: AC
  Administered 2012-01-06: 1 mg via INTRAVENOUS
  Filled 2012-01-06: qty 1

## 2012-01-06 MED ORDER — ONDANSETRON HCL 4 MG/2ML IJ SOLN: 4.0000 mg | Freq: Four times a day (QID) | INTRAMUSCULAR | Status: DC | PRN

## 2012-01-06 MED ORDER — ONDANSETRON HCL 4 MG/2ML IJ SOLN
4.0000 mg | Freq: Once | INTRAMUSCULAR | Status: AC
  Administered 2012-01-06: 4 mg via INTRAVENOUS
  Filled 2012-01-06: qty 2

## 2012-01-06 MED ORDER — FOLIC ACID 1 MG PO TABS
1.0000 mg | ORAL_TABLET | Freq: Every day | ORAL | Status: DC
  Administered 2012-01-06 – 2012-01-10 (×5): 1 mg via ORAL
  Filled 2012-01-06 (×5): qty 1

## 2012-01-06 MED ORDER — ALBUTEROL SULFATE (5 MG/ML) 0.5% IN NEBU: 2.5000 mg | INHALATION_SOLUTION | RESPIRATORY_TRACT | Status: DC | PRN

## 2012-01-06 MED ORDER — SODIUM CHLORIDE 0.9 % IV SOLN
Freq: Once | INTRAVENOUS | Status: AC
  Administered 2012-01-06: 14:00:00 via INTRAVENOUS

## 2012-01-06 MED ORDER — ASPIRIN EC 81 MG PO TBEC
81.0000 mg | DELAYED_RELEASE_TABLET | Freq: Every day | ORAL | Status: DC
  Administered 2012-01-06 – 2012-01-10 (×5): 81 mg via ORAL
  Filled 2012-01-06 (×5): qty 1

## 2012-01-06 MED ORDER — ATENOLOL 25 MG PO TABS
25.0000 mg | ORAL_TABLET | Freq: Every day | ORAL | Status: DC
  Administered 2012-01-06 – 2012-01-10 (×5): 25 mg via ORAL
  Filled 2012-01-06 (×5): qty 1

## 2012-01-06 MED ORDER — SODIUM CHLORIDE 0.9 % IJ SOLN
3.0000 mL | Freq: Two times a day (BID) | INTRAMUSCULAR | Status: DC
  Administered 2012-01-06 – 2012-01-07 (×2): 3 mL via INTRAVENOUS
  Administered 2012-01-08: 22:00:00 via INTRAVENOUS
  Administered 2012-01-08 – 2012-01-09 (×4): 3 mL via INTRAVENOUS

## 2012-01-06 MED ORDER — PSYLLIUM 95 % PO PACK
1.0000 | PACK | Freq: Every day | ORAL | Status: DC
  Administered 2012-01-06 – 2012-01-09 (×4): 1 via ORAL
  Filled 2012-01-06 (×4): qty 1

## 2012-01-06 MED ORDER — LORAZEPAM 2 MG/ML IJ SOLN
1.0000 mg | Freq: Four times a day (QID) | INTRAMUSCULAR | Status: AC | PRN
  Administered 2012-01-07 (×2): 1 mg via INTRAVENOUS
  Filled 2012-01-06 (×2): qty 1

## 2012-01-06 MED ORDER — SODIUM CHLORIDE 0.9 % IV BOLUS (SEPSIS)
1000.0000 mL | Freq: Once | INTRAVENOUS | Status: AC
  Administered 2012-01-06: 1000 mL via INTRAVENOUS

## 2012-01-06 MED ORDER — HEPARIN (PORCINE) IN NACL 100-0.45 UNIT/ML-% IJ SOLN
1200.0000 [IU]/h | INTRAMUSCULAR | Status: DC
  Filled 2012-01-06: qty 250

## 2012-01-06 MED ORDER — PANTOPRAZOLE SODIUM 40 MG PO TBEC
40.0000 mg | DELAYED_RELEASE_TABLET | Freq: Every day | ORAL | Status: DC
  Administered 2012-01-06 – 2012-01-10 (×5): 40 mg via ORAL
  Filled 2012-01-06 (×6): qty 1

## 2012-01-06 MED ORDER — FERROUS SULFATE 325 (65 FE) MG PO TABS
325.0000 mg | ORAL_TABLET | Freq: Two times a day (BID) | ORAL | Status: DC
  Administered 2012-01-06 – 2012-01-09 (×5): 325 mg via ORAL
  Filled 2012-01-06 (×8): qty 1

## 2012-01-06 MED ORDER — VITAMIN C 500 MG PO TABS
500.0000 mg | ORAL_TABLET | Freq: Every day | ORAL | Status: DC
  Administered 2012-01-06 – 2012-01-10 (×5): 500 mg via ORAL
  Filled 2012-01-06 (×5): qty 1

## 2012-01-06 MED ORDER — CHLORDIAZEPOXIDE HCL 5 MG PO CAPS
5.0000 mg | ORAL_CAPSULE | Freq: Three times a day (TID) | ORAL | Status: DC
  Administered 2012-01-06 – 2012-01-10 (×12): 5 mg via ORAL
  Filled 2012-01-06 (×12): qty 1

## 2012-01-06 MED ORDER — MIRTAZAPINE 45 MG PO TABS
45.0000 mg | ORAL_TABLET | Freq: Every day | ORAL | Status: DC
  Administered 2012-01-06 – 2012-01-09 (×4): 45 mg via ORAL
  Filled 2012-01-06 (×5): qty 1

## 2012-01-06 MED ORDER — MAGNESIUM SULFATE 40 MG/ML IJ SOLN
2.0000 g | Freq: Once | INTRAMUSCULAR | Status: AC
  Administered 2012-01-06: 2 g via INTRAVENOUS
  Filled 2012-01-06: qty 50

## 2012-01-06 MED ORDER — SODIUM CHLORIDE 0.9 % IV SOLN
INTRAVENOUS | Status: DC
  Administered 2012-01-06: 16:00:00 via INTRAVENOUS

## 2012-01-06 NOTE — ED Notes (Signed)
md alerted of pts pain level.
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
## 2012-01-06 NOTE — ED Notes (Signed)
Pt back from CT

## 2012-01-06 NOTE — Consult Note (Signed)
[facility] Consultation  Referring Provider: [facility] hospitalist Primary Care Physician:  Tillman Abide, MD Primary Gastroenterologist:   Stan Head, M.D. Reason for Consultation:  abdominal pain and abnormal CTscan.  HPI: Billy [name] is a 66 y.o. male seen by Dr. Leone Payor in February of this year for bloody bowel movements and anemia. For evaluation patient had an upper endoscopy which showed esophageal varices and portal gastropathy without stigmata of bleeding. There was a small antral ulcer and duodenitis. H. pylori was negative. On colonoscopy he had rectal varices, hemorrhoids, diverticulosis and non-bleeding right colon AVMs which were ablated (felt to be source of bleeding). Patient was seen for post-procedure follow up April 2013. At that visit  he was started on Aldactone 25 mg daily and advised to follow a low-sodium diet. An ultrasound-guided paracentesis with fluid analysis was ordered but there was insufficient ascites . Patient  was given hepatitis A and hepatitis B vaccinations. He was to followup in 4 months. He has been taking Aldactone but not strictly adhering to low sodium diet. No swelling or abdominal distention. No mental status changes per wife.   Patient came to the emergency department today for evaluation of abdominal pain and fever. His diffuse lower abdominal pain began on Friday (3 days ago). It was constant, unrelated to meals. On Saturday he developed fevers up to 102. He has since noticed that stools have become more watery than normal. Stool frequency (4-5 times daily) is at baseline. Pain became more pronounced and patient came to ED. His white count is 9.5 (usually runs in the mid-5 range). He is afebrile now. He feels much better after Dilaudid. 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 No recent antibiotics. No blood in stool. Stools dark green on iron. Reported as heme positive in ED.      Past Medical History  Diagnosis Date  . Depression     mild at most  . Diverticulosis of colon   . GERD (gastroesophageal reflux disease)   . Hypertension   . Sleep disturbance   . DDD (degenerative disc disease)   . Erectile dysfunction   . GI hemorrhage 2006    esophagitis and 1+ varices  . Alcoholism   . Acute posthemorrhagic anemia   . Thrombocytopenia, unspecified   . Personal history of colonic polyps   . Hemorrhage of gastrointestinal tract, unspecified 08/2011  . Nonspecific elevation of levels of transaminase or lactic acid dehydrogenase (LDH) 08/2011  . Other nonspecific abnormal serum enzyme levels 08/2011  . Ulcer of esophagus     antrum location  . 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 Duodenitis 08/2011    bulb/descending duodenum  . Basal cell carcinoma     Past Surgical History  Procedure Date  . Varicocele repair 1980's  . Knee arthroscopy 2008    Right  . Knee arthroscopy 2008    Left  . Colonoscopy 2001, 200411/02/2006  . Esophagogastroduodenoscopy 2006  . Esophagogastroduodenoscopy 09/11/2011    Procedure: ESOPHAGOGASTRODUODENOSCOPY (EGD);  Surgeon: Iva Boop, MD;  Location: [facility];  Service: Endoscopy;  Laterality: N/A;  . Colonoscopy 09/11/2011    Procedure: COLONOSCOPY;  Surgeon: Iva Boop, MD;  Location: [facility];  Service: Endoscopy;  Laterality: N/A;  . Cataract extraction   . [place]  thora/paracentesis 10/28/2011  . Eye surgery     Prior to Admission medications   Medication Sig Start Date End Date Taking? Authorizing Provider  atenolol (TENORMIN) 25 MG tablet Take 1 tablet (25 mg total) by mouth daily. 08/29/11  Yes Karie Schwalbe, MD  Cholecalciferol (VITAMIN D3) 1000 UNITS CAPS Take 1 capsule by mouth daily.    Yes Historical Provider, MD  ferrous sulfate 325 (65 FE) MG EC tablet Take 1 tablet (325 mg total) by mouth 2 (two) times daily. 10/21/11 10/20/12 Yes Iva Boop, MD  HYDROcodone-acetaminophen (NORCO) 5-325 MG per tablet Take 1 tablet by mouth 3 (three) times daily as needed. 12/23/11  Yes Karie Schwalbe, MD  mirtazapine (REMERON) 45 MG tablet TAKE ONE BY MOUTH AT BEDTIME 09/20/11  Yes Karie Schwalbe, MD  Multiple Vitamin (MULTIVITAMIN) tablet Take 1 tablet by mouth daily.   Yes Historical Provider, MD  NON FORMULARY Mega Red once daily.   Yes Historical Provider, MD  omeprazole (PRILOSEC) 40 MG capsule Take 1 capsule (40 mg total) by mouth daily. 08/20/11  Yes Karie Schwalbe, MD  psyllium (METAMUCIL) 58.6 % packet Take 1 packet by mouth daily.   Yes Historical Provider, MD  spironolactone (ALDACTONE) 25 MG tablet Take 1 tablet (25 mg total) by mouth daily. 12/17/11 12/16/12 Yes Iva Boop, MD  VIAGRA 50 MG tablet TAKE ONE BY MOUTH AS DIRECTED 11/29/11  Yes Karie Schwalbe, MD  vitamin C (ASCORBIC ACID) 500 MG tablet Take 500 mg by mouth daily.   Yes Historical Provider, MD    Current Facility-Administered Medications  Medication Dose Route Frequency Provider Last Rate Last Dose  . 0.9 %  sodium chloride infusion   Intravenous Once Suzi Roots, MD 20 mL/hr at 01/06/12 1353    . HYDROmorphone (DILAUDID) injection 1 mg  1 mg Intravenous Once Suzi Roots, MD   1 mg at 01/06/12 0858  . HYDROmorphone (DILAUDID) injection 1 mg  1 mg Intravenous Once Suzi Roots, MD   1 mg at 01/06/12 0957  . HYDROmorphone (DILAUDID) injection 1 mg  1 mg Intravenous Once Suzi Roots, MD   1 mg at 01/06/12 1305  . iohexol (OMNIPAQUE) 300 MG/ML solution 100 mL  100 mL Intravenous Once PRN Medication Radiologist, MD   100 mL at 01/06/12 1035  . ondansetron (ZOFRAN) injection 4 mg  4 mg Intravenous Once Suzi Roots, MD   4 mg at 01/06/12 0858  . sodium chloride 0.9 % bolus 1,000 mL  1,000 mL Intravenous  Once Suzi Roots, MD   1,000 mL at 01/06/12 1141  . sodium chloride 0.9 % bolus 500 mL  500 mL Intravenous Once Suzi Roots, MD   500 mL at 01/06/12 0857  . sodium chloride 0.9 % bolus 500 mL  500 mL Intravenous Once Suzi Roots, MD   500 mL at 01/06/12 1305  . DISCONTD: heparin ADULT infusion 100 units/mL (25000 units/250 mL)  1,200 Units/hr Intravenous Continuous Christine E Shade, PHARMD      . DISCONTD: heparin bolus via infusion 3,000 Units  3,000 Units Intravenous Once Winfield Rast, PHARMD       Current Outpatient Prescriptions  Medication Sig Dispense Refill  . atenolol (TENORMIN) 25 MG tablet Take 1 tablet (25 mg total) by mouth daily.  90 tablet  3  . Cholecalciferol (VITAMIN D3) 1000 UNITS CAPS Take 1 capsule by mouth daily.      . ferrous sulfate  325 (65 FE) MG EC tablet Take 1 tablet (325 mg total) by mouth 2 (two) times daily.  60 tablet  3  . HYDROcodone-acetaminophen (NORCO) 5-325 MG per tablet Take 1 tablet by mouth 3 (three) times daily as needed.  90 tablet  0  . mirtazapine (REMERON) 45 MG tablet TAKE ONE BY MOUTH AT BEDTIME  90 tablet  3  . Multiple Vitamin (MULTIVITAMIN) tablet Take 1 tablet by mouth daily.      . NON FORMULARY Mega Red once daily.      Marland Kitchen omeprazole (PRILOSEC) 40 MG capsule Take 1 capsule (40 mg total) by mouth daily.  90 capsule  3  . psyllium (METAMUCIL) 58.6 % packet Take 1 packet by mouth daily.      Marland Kitchen spironolactone (ALDACTONE) 25 MG tablet Take 1 tablet (25 mg total) by mouth daily.  90 tablet  2  . VIAGRA 50 MG tablet TAKE ONE BY MOUTH AS DIRECTED  10 tablet  0  . vitamin C (ASCORBIC ACID) 500 MG tablet Take 500 mg by mouth daily.        Allergies as of 01/06/2012  . (No Known Allergies)    Family History  Problem Relation Age of Onset  . Stroke Mother   . Diabetes Mother   . Colon cancer      History   Social History  . Marital Status: Married    Spouse Name: N/A    Number of Children: 1  . Years of Education: N/A    Occupational History  . Retired     mostly from [employer]   Social History Main Topics  . Smoking status: Current Everyday Smoker -- 0.5 packs/day    Types: Cigarettes  . Smokeless tobacco: Never Used  . Alcohol Use: No  . Drug Use: No  . Sexually Active: Not Currently    Social History Narrative   Married- 2nd1 son (adopted) from previous marriageStill looking from some work Using nicorette gum    Review of Systems: Loss of appetite. All other systems reviewed and negative except where noted in HPI.   PHYSICAL EXAM: Vital signs in last 24 hours: Temp:  [98.2 F (36.8 C)-98.7 F (37.1 C)] 98.7 F (37.1 C) (06/24 1214) Pulse Rate:  [80-81] 80  (06/24 1208) Resp:  [16-18] 16  (06/24 1208) BP: (150-154)/(67-75) 150/67 mmHg (06/24 1208) SpO2:  [95 %] 95 % (06/24 1208) Weight:  [175 lb (79.379 kg)] 175 lb (79.379 kg) (06/24 1253)   General:   Pleasant white male in NAD Head:  Normocephalic and atraumatic. Eyes:   No icterus.   Conjunctiva pink. Ears:  Normal auditory acuity. neck:  Supple; no masses felt Lungs:  Respirations even and unlabored. Lungs clear to auscultation bilaterally.   No wheezes, crackles, or rhonchi.  Heart:  Regular rate and rhythm; no murmurs heard. Abdomen:  Soft, nondistended, nontender. Normal bowel sounds. No appreciable masses or hepatomegaly.  Rectal:  Not repeated. Heme positive in ED. Stools dark green (iron).   Msk:  Symmetrical without gross deformities.  Extremities:  Without edema. Neurologic:  Alert and  oriented x4;  grossly normal neurologically. No asterixis Skin:  Intact without significant lesions or rashes. Cervical Nodes:  No significant cervical adenopathy. Psych:  Alert and cooperative. Normal affect.  LAB RESULTS:  Basename 01/06/12 0847  WBC 9.5  HGB 14.9  HCT 42.4  PLT 121*   BMET  Basename 01/06/12 0847  NA 128*  K 4.2  CL 92*  CO2 23  GLUCOSE 108*  BUN 10  CREATININE 0.56  CALCIUM 9.1   LFT  Basename  01/06/12 0847  PROT 7.5  ALBUMIN 3.4*  AST 40*  ALT 24  ALKPHOS 103  BILITOT 0.7  BILIDIR --  IBILI --   PT/INR  Basename 01/06/12 0845  LABPROT 12.8  INR 0.94    STUDIES: Ct Abdomen Pelvis W Contrast  01/06/2012  *RADIOLOGY REPORT*  Clinical Data: Lower abdominal pain for 3 days, diarrhea  CT ABDOMEN AND PELVIS WITH CONTRAST  Technique:  Multidetector CT imaging of the abdomen and pelvis was performed following the standard protocol during bolus administration of intravenous contrast. Sagittal and coronal MPR images reconstructed from axial data set.  Contrast: OMNIPAQUE IOHEXOL 300 MG/ML  SOLN Dilute oral contrast.  Comparison: None  Findings: Lung bases clear. Cirrhotic nodular appearing liver without focal mass lesion. Dependent density in gallbladder question cholelithiasis. Small amount of upper abdominal ascites with diffuse stranding of intra abdominal fat planes. Recannulization of umbilical vein.  Splenomegaly, spleen measuring 15.7 x 8.8 x 14.1 cm. No additional focal abnormalities of the spleen, pancreas, kidneys, or adrenal glands. Scattered atherosclerotic calcifications without aneurysm. Numerous normal and upper normal-sized mesenteric/retroperitoneal nodes. 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 Mild calcification is seen within the wall of the superior mesenteric and portal veins. Splenic and portal veins appear patent. Mild wall thickening identified at the colon including ascending and descending segments question colitis.  Normal appendix. No gross small bowel abnormalities identified. Numerous collaterals noted at the right pericolic and right retroperitoneum and to a lesser degree on left. No definite mass or hernia. Degenerative disc disease changes L3-L4, L4-L5, L5-S1 with spurring and bulging discs. Facet degenerative changes lower lumbar spine.  IMPRESSION: Cirrhotic appearing liver with splenomegaly, ascites, and extensive upper  abdominal collaterals. 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 Small amount of calcification is seen at the walls of the portal and superior mesenteric veins raising question of sequela of prior thrombosis. Scattered wall thickening of the colon question colitis though less likely this could be related to cirrhosis, ascites, and partial mesenteric thrombosis.  Findings called to Dr. Denton Lank on 01/06/2012 at 1100 hours.  Original Report Authenticated By: Lollie Marrow, M.D.     PREVIOUS ENDOSCOPIES: see HPI  IMPRESSION / PLAN: 1. 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  He has a history of grade 2 varices on February 2013 EGD. No evidence for encephalopathy. No significant ascites obvious on exam and only a small amount of upper abdominal ascites seen on CT scan.  Patient reports low grade fevers at home / abdominal pain (see #4). Doubt SBP.   2. History of ETOH  use,"No significant" ETOH since April 2013. No ETOH at all in 10 days.   3. 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 We will review the scans. Hold anti-coagulation for now.   4. Two -three day history of bowel change and diffuse lower abdominal pain . Stool more watery than normal. Stool frequency is at baseline for him (4-5 times / day). In setting of low grade temp and CTscan findings we need to evaluate for infectious colitis though distribution of colitis is unusual for infectious process. Await stool studies.    4. History of esophagitis, on PPI at home. Continue PPI.   5. History of colonic AVM, ablated February 2013.   6. Chronic back pain, disc disease on CTscan.  Thanks   LOS: 0 days   Willette Cluster  01/06/2012, 2:56 PM  I have reviewed the above note, examined the patient and agree with plan of treatment.Acute abdominal pain is suggestive of segmental colitis, either infectious or ischemic, doubt IBD. SMV  thrombosis was  likely present in March 2013 when he had Doppler studies of portal and splenic veins which showed a clot in portal vein. On today's study the portal vein is normal. There may be a slow resolution of the SMV partial occlusion due to portal hypertension. I would not recommend anticoagulation acutely or long term in this situation.  We will proceed with flex sigmoid tomorrow to access left colon inflammation and to obtain biopsies. Would hold off antibiotics. Stay on clear liquids.

## 2012-01-06 NOTE — ED Notes (Signed)
Pt alert and oriented x4. Respirations even and unlabored, bilateral symmetrical rise and fall of chest. Skin warm and dry. In no acute distress. Denies needs.   

## 2012-01-06 NOTE — ED Notes (Addendum)
First attempt to give report to floor rn. Floor rn will call rn back

## 2012-01-06 NOTE — ED Notes (Signed)
Report given to kirsten, rn on floor.  

## 2012-01-06 NOTE — Progress Notes (Signed)
ANTICOAGULATION CONSULT NOTE - Initial Consult  Pharmacy Consult for Heparin Indication: mesenteric venous thrombosis  No Known Allergies  Patient Measurements: Height: 6\' 3"  (190.5 cm) Weight: 175 lb (79.379 kg) IBW/kg (Calculated) : 84.5   Vital Signs: Temp: 98.7 F (37.1 C) (06/24 1214) Temp src: Oral (06/24 1214) BP: 150/67 mmHg (06/24 1208) Pulse Rate: 80  (06/24 1208)  Labs:  Basename 01/06/12 0847 01/06/12 0845  HGB 14.9 --  HCT 42.4 --  PLT 121* --  APTT -- --  LABPROT -- 12.8  INR -- 0.94  HEPARINUNFRC -- --  CREATININE 0.56 --  CKTOTAL -- --  CKMB -- --  TROPONINI -- --    Estimated Creatinine Clearance: 106.1 ml/min (by C-G formula based on Cr of 0.56).   Medical History: Past Medical History  Diagnosis Date  . Depression     mild at most  . Diverticulosis of colon   . GERD (gastroesophageal reflux disease)   . Hypertension   . Sleep disturbance   . DDD (degenerative disc disease)   . Erectile dysfunction   . GI hemorrhage 2006    esophagitis and 1+ varices  . Alcoholism   . Acute posthemorrhagic anemia   . Thrombocytopenia, unspecified   . Personal history of colonic polyps   . Hemorrhage of gastrointestinal tract, unspecified 08/2011  . Nonspecific elevation of levels of transaminase or lactic acid dehydrogenase (LDH) 08/2011  . Other nonspecific abnormal serum enzyme levels 08/2011  . Ulcer of esophagus     antrum location  . 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 Duodenitis 08/2011    bulb/descending duodenum  . Basal cell carcinoma     Medications:  Scheduled:    . HYDROmorphone  1 mg Intravenous Once  . HYDROmorphone  1 mg Intravenous Once  . HYDROmorphone  1 mg Intravenous Once  . ondansetron (ZOFRAN) IV  4 mg Intravenous Once  . sodium chloride  1,000 mL Intravenous Once  . sodium chloride  500 mL Intravenous Once  . 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

## 2012-01-06 NOTE — ED Notes (Signed)
rn called pharmacy about heparin, all heparin medication is to be held until GI consult sees pt and says it is okay.

## 2012-01-06 NOTE — H&P (Signed)
[facility]                                                                                    Patient Demographics  Billy [name], is a 66 y.o. male  CSN: [id number]  MRN: [id number]  DOB - 1946-05-04  Admit Date - 01/06/2012  Outpatient Primary MD for the patient is Tillman Abide, MD   With History of -  Past Medical History  Diagnosis Date  . Depression     mild at most  . Diverticulosis of colon   . GERD (gastroesophageal reflux disease)   . Hypertension   . Sleep disturbance   . DDD (degenerative disc disease)   . Erectile dysfunction   . GI hemorrhage 2006    esophagitis and 1+ varices  . Alcoholism   . Acute posthemorrhagic anemia   . Thrombocytopenia, unspecified   . Personal history of colonic polyps   . Hemorrhage of gastrointestinal tract, unspecified 08/2011  . Nonspecific elevation of levels of transaminase or lactic acid dehydrogenase (LDH) 08/2011  . Other nonspecific abnormal serum enzyme levels 08/2011  . Ulcer of esophagus     antrum location  . 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 Duodenitis 08/2011    bulb/descending duodenum  . Basal cell carcinoma       Past Surgical History  Procedure Date  . Varicocele repair 1980's  . Knee arthroscopy 2008    Right  . Knee arthroscopy 2008    Left  . Colonoscopy 2001, 200411/02/2006  . Esophagogastroduodenoscopy 2006  . Esophagogastroduodenoscopy 09/11/2011    Procedure: ESOPHAGOGASTRODUODENOSCOPY (EGD);  Surgeon: Iva Boop, MD;  Location: [facility];  Service: Endoscopy;  Laterality: N/A;  . Colonoscopy 09/11/2011    Procedure: COLONOSCOPY;  Surgeon: Iva Boop, MD;  Location: [facility];  Service: Endoscopy;  Laterality: N/A;  . Cataract extraction   . [place] thora/paracentesis 10/28/2011  . Eye surgery     in for   Chief Complaint  Patient presents with  . Abdominal Pain     HPI  Billy [name]  is a 66  y.o. male,  with history of alcohol abuse, patient says that he quit regular alcohol intake few months ago and now drinks a bottle of beer every 3-4 days, who was recently diagnosed with alcoholic cirrhosis by Dr. Stan Head few months ago, he also carries a diagnosis of esophageal and rectal varices. 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 She denies noticing any blood or mucus in his stools, denies any diarrhea denies any nausea or vomiting.   He presented to the ER today where CT scan of his abdomen was suspicious for superior mesenteric vein clot unclear acute versus chronic, cirrhosis of liver, small amounts of ascites not enough fluid to be tapped per radiologist, and  colonic thickening which was secondary to a ascites versus clot versus infection. The ER physician discussed the case with gastroenterologist on call who recommended internal medicine admission.    Review of Systems    In addition to the HPI above,   + Fever-chills, No Headache, No changes with Vision or hearing, No problems swallowing food or Liquids, No Chest pain, Cough or Shortness of Breath, No Nausea or Vommitting, Bowel movements are regular, No Blood in stool or Urine, No dysuria, No new skin rashes or bruises, No new joints pains-aches,  No new weakness, tingling, numbness in any extremity, No recent weight gain or loss, No polyuria, polydypsia or polyphagia, No significant Mental Stressors.  A full 10 point Review of Systems was done, except as stated above, all other Review of Systems were negative.   Social History History  Substance Use Topics  . Smoking status:  Current Everyday Smoker -- 0.5 packs/day    Types: Cigarettes  . Smokeless tobacco: Never Used  . Alcohol Use: No     Family History Family History  Problem Relation Age of Onset  . Stroke Mother   . Diabetes Mother   . Colon cancer       Prior to Admission medications   Medication Sig Start Date End Date Taking? Authorizing Provider  atenolol (TENORMIN) 25 MG tablet Take 1 tablet (25 mg total) by mouth daily. 08/29/11  Yes Karie Schwalbe, MD  Cholecalciferol (VITAMIN D3) 1000 UNITS CAPS Take 1 capsule by mouth daily.   Yes Historical Provider, MD  ferrous sulfate 325 (65 FE) MG EC tablet Take 1 tablet (325 mg total) by mouth 2 (two) times daily. 10/21/11 10/20/12 Yes Iva Boop, MD  HYDROcodone-acetaminophen (NORCO) 5-325 MG per tablet Take 1 tablet by mouth 3 (three) times daily as needed. 12/23/11  Yes Karie Schwalbe, MD  mirtazapine (REMERON) 45 MG tablet TAKE ONE BY MOUTH AT BEDTIME 09/20/11  Yes Karie Schwalbe, MD  Multiple Vitamin (MULTIVITAMIN) tablet Take 1 tablet by mouth daily.   Yes Historical Provider, MD  NON FORMULARY Mega Red once daily.   Yes Historical Provider, MD  omeprazole (PRILOSEC) 40 MG capsule Take 1 capsule (40 mg total) by mouth daily. 08/20/11  Yes Karie Schwalbe, MD  psyllium (METAMUCIL) 58.6 % packet Take 1 packet by mouth daily.   Yes Historical Provider, MD  spironolactone (ALDACTONE) 25 MG tablet Take 1 tablet (25 mg total) by mouth daily. 12/17/11 12/16/12 Yes Iva Boop, MD  VIAGRA 50 MG tablet TAKE ONE BY MOUTH AS DIRECTED 11/29/11  Yes Karie Schwalbe, MD  vitamin C (ASCORBIC ACID) 500 MG tablet Take 500 mg by mouth daily.   Yes Historical Provider, MD    No Known Allergies  Physical Exam  Vitals  Blood pressure 150/67, pulse 80, temperature 98.7 F (37.1 C), temperature source Oral, resp. rate 16, height 6\' 3"  (1.905 m), weight 79.379 kg (175 lb), SpO2 95.00%.   1. General middle-aged Caucasian male lying in bed in NAD,    2. Normal  affect and insight, Not Suicidal or Homicidal, Awake Alert, Oriented *3.  3. No F.N deficits, ALL C.Nerves Intact, Strength 5/5 all 4 extremities, Sensation intact all 4 extremities, Plantars down going.  4. Ears and Eyes appear Normal, Conjunctivae clear, PERRLA. Moist Oral Mucosa.  5. Supple Neck, No JVD, No cervical lymphadenopathy appriciated, No Carotid Bruits.  6. Symmetrical Chest wall movement, Good air movement bilaterally, CTAB.  7. RRR, No Gallops, Rubs  or Murmurs, No Parasternal Heave.  8. Positive Bowel Sounds, Abdomen Soft, mild lower cord and tenderness especially in the right lower quadrant, No organomegaly appriciated,  No rebound -guarding or rigidity, minimal to no ascites.  9.  No Cyanosis, Normal Skin Turgor, No Skin Rash or Bruise.  10. Good muscle tone,  joints appear normal , no effusions, Normal ROM.  11. No Palpable Lymph Nodes in Neck or Axillae    Data Review  CBC  Lab 01/06/12 0847  WBC 9.5  HGB 14.9  HCT 42.4  PLT 121*  MCV 88.7  MCH 31.2  MCHC 35.1  RDW 17.8*  LYMPHSABS 0.8  MONOABS 0.9  EOSABS 0.0  BASOSABS 0.0  BANDABS --   ------------------------------------------------------------------------------------------------------------------  Chemistries   Lab 01/06/12 0847  NA 128*  K 4.2  CL 92*  CO2 23  GLUCOSE 108*  BUN 10  CREATININE 0.56  CALCIUM 9.1  MG --  AST 40*  ALT 24  ALKPHOS 103  BILITOT 0.7   ------------------------------------------------------------------------------------------------------------------ estimated creatinine clearance is 106.1 ml/min (by C-G formula based on Cr of 0.56). ------------------------------------------------------------------------------------------------------------------ No results found for this basename: TSH,T4TOTAL,FREET3,T3FREE,THYROIDAB in the last 72 hours   Coagulation profile  Lab 01/06/12 0845  INR 0.94  PROTIME --    ------------------------------------------------------------------------------------------------------------------- No results found for this basename: DDIMER:2 in the last 72 hours -------------------------------------------------------------------------------------------------------------------  Cardiac Enzymes No results found for this basename: CK:3,CKMB:3,TROPONINI:3,MYOGLOBIN:3 in the last 168 hours ------------------------------------------------------------------------------------------------------------------ No components found with this basename: POCBNP:3   ---------------------------------------------------------------------------------------------------------------  Urinalysis    Component Value Date/Time   COLORURINE AMBER* 01/06/2012 0855   APPEARANCEUR CLEAR 01/06/2012 0855   LABSPEC 1.029 01/06/2012 0855   PHURINE 6.0 01/06/2012 0855   GLUCOSEU NEGATIVE 01/06/2012 0855   HGBUR NEGATIVE 01/06/2012 0855   BILIRUBINUR SMALL* 01/06/2012 0855   KETONESUR TRACE* 01/06/2012 0855   PROTEINUR NEGATIVE 01/06/2012 0855   UROBILINOGEN 0.2 01/06/2012 0855   NITRITE NEGATIVE 01/06/2012 0855   LEUKOCYTESUR NEGATIVE 01/06/2012 0855       Imaging results:   Ct Abdomen Pelvis W Contrast  01/06/2012  *RADIOLOGY REPORT*  Clinical Data: Lower abdominal pain for 3 days, diarrhea  CT ABDOMEN AND PELVIS WITH CONTRAST  Technique:  Multidetector CT imaging of the abdomen and pelvis was performed following the standard protocol during bolus administration of intravenous contrast. Sagittal and coronal MPR images reconstructed from axial data set.  Contrast: OMNIPAQUE IOHEXOL 300 MG/ML  SOLN Dilute oral contrast.  Comparison: None  Findings: Lung bases clear. Cirrhotic nodular appearing liver without focal mass lesion. Dependent density in gallbladder question cholelithiasis. Small amount of upper abdominal ascites with diffuse stranding of intra abdominal fat planes. Recannulization of  umbilical vein.  Splenomegaly, spleen measuring 15.7 x 8.8 x 14.1 cm. No additional focal abnormalities of the spleen, pancreas, kidneys, or adrenal glands. Scattered atherosclerotic calcifications without aneurysm. Numerous normal and upper normal-sized mesenteric/retroperitoneal nodes. 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 Mild calcification is seen within the wall of the superior mesenteric and portal veins. Splenic and portal veins appear patent. Mild wall thickening identified at the colon including ascending and descending segments question colitis.  Normal appendix. No gross small bowel abnormalities identified. Numerous collaterals noted at the right pericolic and right retroperitoneum and to a lesser degree on left. No definite mass or hernia. Degenerative disc disease changes L3-L4, L4-L5, L5-S1 with spurring and bulging discs. Facet degenerative changes lower lumbar spine.  IMPRESSION: Cirrhotic appearing liver with splenomegaly, ascites, and extensive upper abdominal collaterals.  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 Small amount of calcification is seen at the walls of the portal and superior mesenteric veins raising question of sequela of prior thrombosis. Scattered wall thickening of the colon question colitis though less likely this could be related to cirrhosis, ascites, and partial mesenteric thrombosis.  Findings called to Dr. Denton Lank on 01/06/2012 at 1100 hours.  Original Report Authenticated By: Lollie Marrow, M.D.      Assessment & Plan   1. Abdominal pain in a patient with history of alcoholic cirrhosis, esophageal and rectal varices, Angiodysplasia of colon, ascites, questionable superior mesenteric artery clot versus colitis - patient does have history of hemorrhage in the past, for now I have requested personally further GI physician to see the patient, I have discussed the case with radiologist who suggested there is not enough fluid to  be tapped, I have ordered blood cultures, we'll admit the patient to telemetry, will place him on low-dose aspirin, suspicion for SBP is low however will defer to GI if patient needs empiric antibiotics. As dictated above fluid is not enough to be tapped. For now will hold her heparin drip unless suggested by GI.     2. History of alcohol abuse patient says that he does not drink alcohol or regular basis anymore, he does smoke. He has been counseled to quit both smoking and alcohol, will place him on low-dose schedule Librium along with and Ativan IV when necessary per CIWA protocol.     3. 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     4. Hypertension- no acute issues continue home medications.     5. Mild hyponatremia- question whether this is due to alcohol intake/excessive free water intake versus dehydration due to recent fever chills, will check urine sodium and osmolality along with serum osmolality- gentle  normal saline repeat BMP in the morning.   DVT Prophylaxis  SCDs    AM Labs Ordered, also please review Full Orders  Admission, patients condition and plan of care including tests being ordered have been discussed with the patient and wife who indicate understanding and agree with the plan and Code Status.  Code Status Full  Condition Marinell Blight K M.D on 01/06/2012 at 1:51 PM  Between 7am to 7pm - Pager - [phone]  After 7pm go to [email or web address] - password [email or web address]  And look for the night coverage person covering me after hours  [facility] Office  [phone]

## 2012-01-06 NOTE — ED Provider Notes (Addendum)
History     CSN: [id number]  Arrival date & time 01/06/12  0805   First MD Initiated Contact with Patient 01/06/12 0827      Chief Complaint  Patient presents with  . Abdominal Pain    (Consider location/radiation/quality/duration/timing/severity/associated sxs/prior treatment) Patient is a 66 y.o. male presenting with abdominal pain. The history is provided by the patient.  Abdominal Pain The primary symptoms of the illness include abdominal pain and fever. The primary symptoms of the illness do not include shortness of breath or dysuria.  Symptoms associated with the illness do not include chills.  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 Constant.dull. Non radiating. No specific exacerbating or alleviating facdtors. Nausea. Poor appetite. No vomiting. Few loose bms. No severe diarrhea or constipation. States fever 2 days ago. No chills/sweats. Hx diverticula. No prior abd surgery. Pain is bil lower quadrant. No scrotal or testicular pain. No gu c/o.      Past Medical History  Diagnosis Date  . Depression     mild at most  . Diverticulosis of colon   . GERD (gastroesophageal reflux disease)   . Hypertension   . Sleep disturbance   . DDD (degenerative disc disease)   . Erectile dysfunction   . GI hemorrhage 2006    esophagitis and 1+ varices  . Alcoholism   . Acute posthemorrhagic anemia   . Thrombocytopenia, unspecified   . Personal history of colonic polyps   . Hemorrhage of gastrointestinal tract, unspecified 08/2011  . Nonspecific elevation of levels of transaminase or lactic acid dehydrogenase (LDH) 08/2011  . Other nonspecific abnormal serum enzyme levels 08/2011  . Ulcer of esophagus     antrum location  . 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 Duodenitis 08/2011    bulb/descending duodenum  . Basal cell carcinoma     Past Surgical History  Procedure  Date  . Varicocele repair 1980's  . Knee arthroscopy 2008    Right  . Knee arthroscopy 2008    Left  . Colonoscopy 2001, 200411/02/2006  . Esophagogastroduodenoscopy 2006  . Esophagogastroduodenoscopy 09/11/2011    Procedure: ESOPHAGOGASTRODUODENOSCOPY (EGD);  Surgeon: Iva Boop, MD;  Location: [facility];  Service: Endoscopy;  Laterality: N/A;  . Colonoscopy 09/11/2011    Procedure: COLONOSCOPY;  Surgeon: Iva Boop, MD;  Location: [facility];  Service: Endoscopy;  Laterality: N/A;  . Cataract extraction     Family History  Problem Relation Age of Onset  . Stroke Mother   . Diabetes Mother   . Colon cancer      History  Substance Use Topics  . Smoking status: Current Everyday Smoker  . Smokeless tobacco: Never Used  . Alcohol Use: No      Review of Systems  Constitutional: Positive for fever. Negative for chills.  HENT: Negative for congestion, sore throat, neck pain and neck stiffness.   Eyes: Negative for pain and redness.  Respiratory: Negative for cough and shortness of breath.   Cardiovascular: Negative for chest pain.  Gastrointestinal: Positive for abdominal pain.  Genitourinary: Negative for dysuria and flank pain.  Musculoskeletal: Negative for myalgias and arthralgias.  Skin: Negative for rash.  Neurological: Negative for headaches.  Hematological: Does not bruise/bleed easily.  Psychiatric/Behavioral: Negative for confusion.    Allergies  Review of patient's allergies indicates no known allergies.  Home Medications   Current Outpatient Rx  Name  Route Sig Dispense Refill  . ATENOLOL 25 MG PO TABS Oral Take 1 tablet (25 mg total) by mouth daily. 90 tablet 3  . VITAMIN D3 1000 UNITS PO CAPS Oral Take 1 capsule by mouth daily.    Marland Kitchen FERROUS SULFATE 325 (65 FE) MG PO TBEC Oral Take 1 tablet (325 mg total) by mouth 2 (two) times daily. 60 tablet 3  . HYDROCODONE-ACETAMINOPHEN 5-325 MG PO TABS Oral Take 1 tablet by mouth 3 (three) times daily as  needed. 90 tablet 0  . MIRTAZAPINE 45 MG PO TABS  TAKE ONE BY MOUTH AT BEDTIME 90 tablet 3  . ONE-DAILY MULTI VITAMINS PO TABS Oral Take 1 tablet by mouth daily.    . NON FORMULARY  Mega Red once daily.    Marland Kitchen OMEPRAZOLE 40 MG PO CPDR Oral Take 1 capsule (40 mg total) by mouth daily. 90 capsule 3  . PSYLLIUM 58.6 % PO PACK Oral Take 1 packet by mouth daily.    Marland Kitchen SPIRONOLACTONE 25 MG PO TABS Oral Take 1 tablet (25 mg total) by mouth daily. 90 tablet 2  . VIAGRA 50 MG PO TABS  TAKE ONE BY MOUTH AS DIRECTED 10 tablet 0  . VITAMIN C 500 MG PO TABS Oral Take 500 mg by mouth daily.      BP 154/75  Pulse 81  Temp 98.2 F (36.8 C) (Oral)  Resp 18  SpO2 95%  Physical Exam  Nursing note and vitals reviewed. Constitutional: He is oriented to person, place, and time. He appears well-developed and well-nourished. No distress.  HENT:  Head: Atraumatic.  Nose: Nose normal.  Mouth/Throat: Oropharynx is clear and moist.  Eyes: Conjunctivae are normal.  Neck: Neck supple. No tracheal deviation present.       No stiffness or rigidity  Cardiovascular: Normal rate, regular rhythm, normal heart sounds and intact distal pulses.   Pulmonary/Chest: Effort normal and breath sounds normal. No accessory muscle usage. No respiratory distress.  Abdominal: Soft. Bowel sounds are normal. He exhibits no distension and no mass. There is tenderness. There is no rebound and no guarding.       Tenderness bil lower quadrant.   Genitourinary:       No cva tenderness. Very dark brown liquid stool, heme positive.   Musculoskeletal: Normal range of motion. He exhibits no tenderness.  Neurological: He is alert and oriented to person, place, and time.  Skin: Skin is warm and dry.  Psychiatric: He has a normal mood and affect.    ED Course  Procedures (including critical care time)   Labs Reviewed  CBC  DIFFERENTIAL  COMPREHENSIVE METABOLIC PANEL  LIPASE, BLOOD  PROTIME-INR  URINALYSIS, ROUTINE W REFLEX  MICROSCOPIC   Results for orders placed during the hospital encounter of 01/06/12  CBC      Component Value Range   WBC 9.5  4.0 - 10.5 K/uL   RBC 4.78  4.22 - 5.81 MIL/uL   Hemoglobin 14.9  13.0 - 17.0 g/dL   HCT 40.9  81.1 - 91.4 %   MCV 88.7  78.0 - 100.0 fL   MCH 31.2  26.0 - 34.0 pg   MCHC 35.1  30.0 - 36.0 g/dL   RDW 78.2 (*) 95.6 - 21.3 %   Platelets 121 (*) 150 - 400 K/uL  DIFFERENTIAL      Component Value Range   Neutrophils Relative 82 (*) 43 - 77 %   Neutro Abs 7.7  1.7 - 7.7 K/uL  Lymphocytes Relative 8 (*) 12 - 46 %   Lymphs Abs 0.8  0.7 - 4.0 K/uL   Monocytes Relative 10  3 - 12 %   Monocytes Absolute 0.9  0.1 - 1.0 K/uL   Eosinophils Relative 0  0 - 5 %   Eosinophils Absolute 0.0  0.0 - 0.7 K/uL   Basophils Relative 0  0 - 1 %   Basophils Absolute 0.0  0.0 - 0.1 K/uL  COMPREHENSIVE METABOLIC PANEL      Component Value Range   Sodium 128 (*) 135 - 145 mEq/L   Potassium 4.2  3.5 - 5.1 mEq/L   Chloride 92 (*) 96 - 112 mEq/L   CO2 23  19 - 32 mEq/L   Glucose, Bld 108 (*) 70 - 99 mg/dL   BUN 10  6 - 23 mg/dL   Creatinine, Ser 1.61  0.50 - 1.35 mg/dL   Calcium 9.1  8.4 - 09.6 mg/dL   Total Protein 7.5  6.0 - 8.3 g/dL   Albumin 3.4 (*) 3.5 - 5.2 g/dL   AST 40 (*) 0 - 37 U/L   ALT 24  0 - 53 U/L   Alkaline Phosphatase 103  39 - 117 U/L   Total Bilirubin 0.7  0.3 - 1.2 mg/dL   GFR calc non Af Amer >90  >90 mL/min   GFR calc Af Amer >90  >90 mL/min  PROTIME-INR      Component Value Range   Prothrombin Time 12.8  11.6 - 15.2 seconds   INR 0.94  0.00 - 1.49  URINALYSIS, ROUTINE W REFLEX MICROSCOPIC      Component Value Range   Color, Urine AMBER (*) YELLOW   APPearance CLEAR  CLEAR   Specific Gravity, Urine 1.029  1.005 - 1.030   pH 6.0  5.0 - 8.0   Glucose, UA NEGATIVE  NEGATIVE mg/dL   Hgb urine dipstick NEGATIVE  NEGATIVE   Bilirubin Urine SMALL (*) NEGATIVE   Ketones, ur TRACE (*) NEGATIVE mg/dL   Protein, ur NEGATIVE  NEGATIVE mg/dL   Urobilinogen,  UA 0.2  0.0 - 1.0 mg/dL   Nitrite NEGATIVE  NEGATIVE   Leukocytes, UA NEGATIVE  NEGATIVE  LIPASE, BLOOD      Component Value Range   Lipase 75 (*) 11 - 59 U/L   Ct Abdomen Pelvis W Contrast  01/06/2012  *RADIOLOGY REPORT*  Clinical Data: Lower abdominal pain for 3 days, diarrhea  CT ABDOMEN AND PELVIS WITH CONTRAST  Technique:  Multidetector CT imaging of the abdomen and pelvis was performed following the standard protocol during bolus administration of intravenous contrast. Sagittal and coronal MPR images reconstructed from axial data set.  Contrast: OMNIPAQUE IOHEXOL 300 MG/ML  SOLN Dilute oral contrast.  Comparison: None  Findings: Lung bases clear. Cirrhotic nodular appearing liver without focal mass lesion. Dependent density in gallbladder question cholelithiasis. Small amount of upper abdominal ascites with diffuse stranding of intra abdominal fat planes. Recannulization of umbilical vein.  Splenomegaly, spleen measuring 15.7 x 8.8 x 14.1 cm. No additional focal abnormalities of the spleen, pancreas, kidneys, or adrenal glands. Scattered atherosclerotic calcifications without aneurysm. Numerous normal and upper normal-sized mesenteric/retroperitoneal nodes. 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 Mild calcification is seen within the wall of the superior mesenteric and portal veins. Splenic and portal veins appear patent. Mild wall thickening identified at the colon including ascending and descending segments question colitis.  Normal appendix. No gross small bowel abnormalities identified. Numerous collaterals noted  at the right pericolic and right retroperitoneum and to a lesser degree on left. No definite mass or hernia. Degenerative disc disease changes L3-L4, L4-L5, L5-S1 with spurring and bulging discs. Facet degenerative changes lower lumbar spine.  IMPRESSION: Cirrhotic appearing liver with splenomegaly, ascites, and extensive upper abdominal collaterals.  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 Small amount of calcification is seen at the walls of the portal and superior mesenteric veins raising question of sequela of prior thrombosis. Scattered wall thickening of the colon question colitis though less likely this could be related to cirrhosis, ascites, and partial mesenteric thrombosis.  Findings called to Dr. Denton Lank on 01/06/2012 at 1100 hours.  Original Report Authenticated By: Lollie Marrow, M.D.       MDM  Iv ns bolus. Dilaudid 1 mg iv. zofran iv.  Reviewed nursing notes and prior charts for additional history.   Pt reports recent fever, notes hx diverticula on colonoscopy - will get ct.   Ct results noted, discussed w pt. Lipase sl elevated, no upper abd pain or tenderness. Suspect current symptoms pain not related to mildly elev lipase (i.e. Clinical picture felt not c/w acute pancreatitis).  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   Discussed w gi service - they will see in ed, requests med service admit.  Med service called for admit.  Med service will admit. Team 4, tele, Dr Thedore Mins. States to also call/consult gen surg. gen surg paged.   Additional ns iv bolus.   Recheck pain persists. abd w mid to lower abd tenderness, no peritoneal signs, exam unchanged from prior.  Dilaudid 1 mg iv.   gen surg called back , they will consult.       Suzi Roots, MD 01/06/12 1136  Suzi Roots, MD 01/06/12 1239

## 2012-01-06 NOTE — ED Notes (Addendum)
Pt reports abd cramping since Friday afternoon. Lower abd pain. Decreased appetite. Denies n/v. Diarrhea x4-5 in last 24 hours. Denies blood in stool. Reports fever Saturday controlled with tylenol. Appears restless in triage.

## 2012-01-07 ENCOUNTER — Encounter (HOSPITAL_COMMUNITY)
Admission: EM | Disposition: A | Discharge status: Home / Self Care | Payer: Self-pay | Source: Home / Self Care | Attending: Internal Medicine

## 2012-01-07 DIAGNOSIS — R188 Other ascites: Secondary | ICD-10-CM

## 2012-01-07 DIAGNOSIS — K5289 Other specified noninfective gastroenteritis and colitis: Secondary | ICD-10-CM

## 2012-01-07 DIAGNOSIS — K7030 Alcoholic cirrhosis of liver without ascites: Secondary | ICD-10-CM

## 2012-01-07 DIAGNOSIS — E871 Hypo-osmolality and hyponatremia: Secondary | ICD-10-CM

## 2012-01-07 DIAGNOSIS — A045 Campylobacter enteritis: Secondary | ICD-10-CM

## 2012-01-07 DIAGNOSIS — R109 Unspecified abdominal pain: Secondary | ICD-10-CM

## 2012-01-07 HISTORY — PX: FLEXIBLE SIGMOIDOSCOPY: SHX5431

## 2012-01-07 LAB — CBC
Hemoglobin: 14.2 g/dL (ref 13.0–17.0)
MCH: 30.8 pg (ref 26.0–34.0)
MCHC: 35.1 g/dL (ref 30.0–36.0)
RDW: 17.5 % — ABNORMAL HIGH (ref 11.5–15.5)

## 2012-01-07 LAB — CARDIAC PANEL(CRET KIN+CKTOT+MB+TROPI)
CK, MB: 2.1 ng/mL (ref 0.3–4.0)
Relative Index: INVALID (ref 0.0–2.5)
Total CK: 38 U/L (ref 7–232)

## 2012-01-07 LAB — URINE CULTURE
Colony Count: NO GROWTH
Culture  Setup Time: 201306250229
Culture: NO GROWTH
Special Requests: NORMAL

## 2012-01-07 LAB — BASIC METABOLIC PANEL
Calcium: 8.6 mg/dL (ref 8.4–10.5)
GFR calc Af Amer: >90 mL/min (ref >90)
GFR calc non Af Amer: >90 mL/min (ref >90)
Glucose, Bld: 107 mg/dL — ABNORMAL HIGH (ref 70–99)
Sodium: 127 mEq/L — ABNORMAL LOW (ref 135–145)

## 2012-01-07 LAB — OSMOLALITY, URINE: Osmolality, Ur: 760 mOsm/kg (ref 390–1090)

## 2012-01-07 SURGERY — SIGMOIDOSCOPY, FLEXIBLE
Anesthesia: Moderate Sedation

## 2012-01-07 MED ORDER — MIDAZOLAM HCL 10 MG/2ML IJ SOLN
INTRAMUSCULAR | Status: AC
  Filled 2012-01-07: qty 2

## 2012-01-07 MED ORDER — SODIUM CHLORIDE 0.9 % IV SOLN
INTRAVENOUS | Status: DC
  Administered 2012-01-07: 08:00:00 via INTRAVENOUS

## 2012-01-07 MED ORDER — FENTANYL CITRATE 0.05 MG/ML IJ SOLN
INTRAMUSCULAR | Status: AC
  Filled 2012-01-07: qty 2

## 2012-01-07 MED ORDER — MIDAZOLAM HCL 10 MG/2ML IJ SOLN
INTRAMUSCULAR | Status: DC | PRN
  Administered 2012-01-07 (×3): 2 mg via INTRAVENOUS
  Administered 2012-01-07: 1 mg via INTRAVENOUS

## 2012-01-07 MED ORDER — POTASSIUM CHLORIDE CRYS ER 20 MEQ PO TBCR
40.0000 meq | EXTENDED_RELEASE_TABLET | Freq: Once | ORAL | Status: AC
  Administered 2012-01-07: 40 meq via ORAL
  Filled 2012-01-07: qty 2

## 2012-01-07 MED ORDER — DIPHENHYDRAMINE HCL 50 MG/ML IJ SOLN
INTRAMUSCULAR | Status: AC
  Filled 2012-01-07: qty 1

## 2012-01-07 MED ORDER — FENTANYL CITRATE 0.05 MG/ML IJ SOLN
INTRAMUSCULAR | Status: DC | PRN
  Administered 2012-01-07 (×4): 25 ug via INTRAVENOUS

## 2012-01-07 MED ORDER — METRONIDAZOLE 500 MG PO TABS
500.0000 mg | ORAL_TABLET | Freq: Three times a day (TID) | ORAL | Status: DC
  Filled 2012-01-07 (×6): qty 1

## 2012-01-07 MED ORDER — SODIUM CHLORIDE 0.9 % IV SOLN: Freq: Once | INTRAVENOUS | Status: DC

## 2012-01-07 MED ORDER — METRONIDAZOLE 250 MG PO TABS
250.0000 mg | ORAL_TABLET | Freq: Four times a day (QID) | ORAL | Status: DC
  Administered 2012-01-07 – 2012-01-10 (×12): 250 mg via ORAL
  Filled 2012-01-07 (×16): qty 1

## 2012-01-07 NOTE — Progress Notes (Signed)
*  PRELIMINARY RESULTS* Echocardiogram 2D Echocardiogram has been performed.  Billy [name] 01/07/2012, 11:07 AM

## 2012-01-07 NOTE — Op Note (Signed)
[facility] [address] [place], [place]  [address]  FLEXIBLE SIGMOIDOSCOPY PROCEDURE REPORT  PATIENT:  Billy [name], Billy [name]  MR#:  [id number] BIRTHDATE:  [birth year], 65 yrs. old  GENDER:  male  ENDOSCOPIST:  Hedwig Morton. Juanda Chance, MD Referred by:  PROCEDURE DATE:  01/07/2012 PROCEDURE:  Flexible Sigmoidoscopy with biopsy ASA CLASS:  Class III INDICATIONS:  abdominal pain, diarrhea, abnormal imaging accute onset abd. pain and diarrhea, heme positive, CT scan shows colitis   MEDICATIONS:   These medications were titrated to patient response per physician's verbal order, Versed 6 mg, Fentanyl 100 mcg  DESCRIPTION OF PROCEDURE:   After the risks benefits and alternatives of the procedure were thoroughly explained, informed consent was obtained.  Digital rectal exam was performed and revealed no rectal masses.   The pentax ped/col [id number] endoscope was introduced through the anus and advanced to the splenic flexure, without limitations.  The quality of the prep was good. The instrument was then slowly withdrawn as the mucosa was fully examined. <<PROCEDUREIMAGES>>  Colitis was found rectum to descending colon markedly inflamed left colon to as far as the scope reached around 60 cm, friable bleeding mucosa with exudate, no pseudopolyps, no skipped lesions, no diverticuli Random biopsies were obtained and sent to pathology (see image1, image2, image3, image5, image4, image6, image7, and image8).   Retroflexed views in the rectum revealed no abnormalities.    The scope was then withdrawn from the patient and the procedure terminated.  COMPLICATIONS:  None  ENDOSCOPIC IMPRESSION: 1) Colitis in the rectum to descending colon acute severe colitis, ?? ischemic, ? ulcerative vs infectious, s/p biopsies RECOMMENDATIONS: 1) await biopsy results continue bowl rest, await stool studies, would continue Flagyl po  REPEAT EXAM:  In 0 year(s)  for.  ______________________________ Hedwig Morton. Juanda Chance, MD  CC:  n. eSIGNED:   Hedwig Morton. Sadeel Fiddler at 01/07/2012 11:40 AM  Adela Lank, [phone]

## 2012-01-07 NOTE — Progress Notes (Signed)
Pt  Had red bloody stool (approx. 100-245ml) after flexible sigmoidoscopy. Notified NP.  She said to let her know if there were any more bloody stools.

## 2012-01-07 NOTE — Progress Notes (Signed)
No role for surgery at this point. Agree with abx for colitis then advance diet slowly once pain resolves. Call if we can help.

## 2012-01-07 NOTE — Progress Notes (Signed)
[facility]                                                                                Patient Demographics  Billy [name], is a 66 y.o. male  UJW:[id number]  FAO:[id number]  DOB - 03-07-1946  Admit date - 01/06/2012  Admitting Physician Leroy Sea, MD  Outpatient Primary MD for the patient is Tillman Abide, MD  LOS - 1   Chief Complaint  Patient presents with  . Abdominal Pain        Subjective:   Billy [name] today has, No headache, No chest pain, some lower quadrant abdominal pain - No Nausea, No new weakness tingling or numbness, No Cough - SOB.   Objective:   Filed Vitals:   01/07/12 1110 01/07/12 1115 01/07/12 1120 01/07/12 1125  BP: 161/90 170/101 147/72 130/89  Pulse:      Temp:      TempSrc:      Resp: 19 20 18 20   Height:      Weight:      SpO2: 99% 100% 99% 99%    Wt Readings from Last 3 Encounters:  01/06/12 80.3 kg (177 lb 0.5 oz)  01/06/12 80.3 kg (177 lb 0.5 oz)  10/21/11 84.55 kg (186 lb 6.4 oz)     Intake/Output Summary (Last 24 hours) at 01/07/12 1206 Last data filed at 01/07/12 0900  Gross per 24 hour  Intake    360 ml  Output      2 ml  Net    358 ml    Exam Awake Alert, Oriented *3, No new F.N deficits, Normal affect [place].AT,PERRAL Supple Neck,No JVD, No cervical lymphadenopathy appriciated.  Symmetrical Chest wall movement, Good air movement bilaterally, CTAB RRR,No Gallops,Rubs or new Murmurs, No Parasternal Heave +ve B.Sounds, Abd Soft, mild ache on lower quadrant deep palpation, No organomegaly appriciated, No rebound -guarding or rigidity. No Cyanosis, Clubbing or edema, No new Rash or bruise    Data Review  CBC  Lab 01/07/12 0152 01/06/12 0847  WBC 7.4 9.5  HGB 14.2 14.9  HCT 40.5 42.4  PLT 117* 121*  MCV 87.9 88.7  MCH 30.8 31.2  MCHC 35.1 35.1  RDW 17.5* 17.8*  LYMPHSABS -- 0.8  MONOABS -- 0.9  EOSABS -- 0.0  BASOSABS -- 0.0  BANDABS -- --    Chemistries   Lab 01/07/12 0152  01/06/12 1820 01/06/12 0847  NA 127* -- 128*  K 3.4* -- 4.2  CL 93* -- 92*  CO2 23 -- 23  GLUCOSE 107* -- 108*  BUN 6 -- 10  CREATININE 0.52 -- 0.56  CALCIUM 8.6 -- 9.1  MG -- 1.7 --  AST -- -- 40*  ALT -- -- 24  ALKPHOS -- -- 103  BILITOT -- -- 0.7   ------------------------------------------------------------------------------------------------------------------ estimated creatinine clearance is 107.3 ml/min (by C-G formula based on Cr of 0.52). ------------------------------------------------------------------------------------------------------------------ No results found for this basename: HGBA1C:2 in the last 72 hours ------------------------------------------------------------------------------------------------------------------ No results found for this basename: CHOL:2,HDL:2,LDLCALC:2,TRIG:2,CHOLHDL:2,LDLDIRECT:2 in the last 72 hours ------------------------------------------------------------------------------------------------------------------  [facility] 01/06/12 1820  TSH 1.547  T4TOTAL --  T3FREE --  THYROIDAB --   ------------------------------------------------------------------------------------------------------------------ No results found for  this basename: VITAMINB12:2,FOLATE:2,FERRITIN:2,TIBC:2,IRON:2,RETICCTPCT:2 in the last 72 hours  Coagulation profile  Lab 01/06/12 0845  INR 0.94  PROTIME --    No results found for this basename: DDIMER:2 in the last 72 hours  Cardiac Enzymes  Lab 01/07/12 0935 01/07/12 0152 01/06/12 1820  CKMB 2.1 2.1 2.0  TROPONINI <0.30 <0.30 <0.30  MYOGLOBIN -- -- --   ------------------------------------------------------------------------------------------------------------------ No components found with this basename: POCBNP:3  Micro Results Recent Results (from the past 240 hour(s))  CULTURE, BLOOD (ROUTINE X 2)     Status: Normal (Preliminary result)   Collection Time   01/06/12  1:47 PM      Component Value  Range Status Comment   Specimen Description BLOOD LEFT ARM   Final    Special Requests BOTTLES DRAWN AEROBIC AND ANAEROBIC   Final    Culture  Setup Time [id number]   Final    Culture     Final    Value:        BLOOD CULTURE RECEIVED NO GROWTH TO DATE CULTURE WILL BE HELD FOR 5 DAYS BEFORE ISSUING A FINAL NEGATIVE REPORT   Report Status PENDING   Incomplete   CULTURE, BLOOD (ROUTINE X 2)     Status: Normal (Preliminary result)   Collection Time   01/06/12  1:51 PM      Component Value Range Status Comment   Specimen Description BLOOD RIGHT ARM   Final    Special Requests BOTTLES DRAWN AEROBIC AND ANAEROBIC   Final    Culture  Setup Time [id number]   Final    Culture     Final    Value:        BLOOD CULTURE RECEIVED NO GROWTH TO DATE CULTURE WILL BE HELD FOR 5 DAYS BEFORE ISSUING A FINAL NEGATIVE REPORT   Report Status PENDING   Incomplete   STOOL CULTURE     Status: Normal (Preliminary result)   Collection Time   01/06/12 10:05 PM      Component Value Range Status Comment   Specimen Description STOOL   Final    Special Requests Normal   Final    Culture Culture reincubated for better growth   Final    Report Status PENDING   Incomplete   CLOSTRIDIUM DIFFICILE BY PCR     Status: Normal   Collection Time   01/06/12 10:05 PM      Component Value Range Status Comment   C difficile by pcr NEGATIVE  NEGATIVE Final     Radiology Reports Ct Abdomen Pelvis W Contrast  01/06/2012  *RADIOLOGY REPORT*  Clinical Data: Lower abdominal pain for 3 days, diarrhea  CT ABDOMEN AND PELVIS WITH CONTRAST  Technique:  Multidetector CT imaging of the abdomen and pelvis was performed following the standard protocol during bolus administration of intravenous contrast. Sagittal and coronal MPR images reconstructed from axial data set.  Contrast: OMNIPAQUE IOHEXOL 300 MG/ML  SOLN Dilute oral contrast.  Comparison: None  Findings: Lung bases clear. Cirrhotic nodular appearing liver without focal  mass lesion. Dependent density in gallbladder question cholelithiasis. Small amount of upper abdominal ascites with diffuse stranding of intra abdominal fat planes. Recannulization of umbilical vein.  Splenomegaly, spleen measuring 15.7 x 8.8 x 14.1 cm. No additional focal abnormalities of the spleen, pancreas, kidneys, or adrenal glands. Scattered atherosclerotic calcifications without aneurysm. Numerous normal and upper normal-sized mesenteric/retroperitoneal nodes. 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 Mild calcification is seen within the wall of the superior mesenteric  and portal veins. Splenic and portal veins appear patent. Mild wall thickening identified at the colon including ascending and descending segments question colitis.  Normal appendix. No gross small bowel abnormalities identified. Numerous collaterals noted at the right pericolic and right retroperitoneum and to a lesser degree on left. No definite mass or hernia. Degenerative disc disease changes L3-L4, L4-L5, L5-S1 with spurring and bulging discs. Facet degenerative changes lower lumbar spine.  IMPRESSION: Cirrhotic appearing liver with splenomegaly, ascites, and extensive upper abdominal collaterals. 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 Small amount of calcification is seen at the walls of the portal and superior mesenteric veins raising question of sequela of prior thrombosis. Scattered wall thickening of the colon question colitis though less likely this could be related to cirrhosis, ascites, and partial mesenteric thrombosis.  Findings called to Dr. Denton [name] on 01/06/2012 at 1100 hours.  Original Report Authenticated By: Lollie Marrow, M.D.    Scheduled Meds:   . sodium chloride   Intravenous Once  . sodium chloride   Intravenous Once  . aspirin EC  81 mg Oral Daily  . atenolol  25 mg Oral Daily  . chlordiazePOXIDE  5 mg Oral TID  . cholecalciferol  1,000 Units Oral Daily  . ferrous  sulfate  325 mg Oral BID WC  . folic acid  1 mg Oral Daily  . HYDROmorphone  1 mg Intravenous Once  . magnesium sulfate 1 - 4 g bolus IVPB  2 g Intravenous Once  . metroNIDAZOLE  500 mg Oral Q8H  . mirtazapine  45 mg Oral QHS  . multivitamin with minerals  1 tablet Oral Daily  . pantoprazole  40 mg Oral Q1200  . potassium chloride  40 mEq Oral Once  . psyllium  1 packet Oral Daily  . sodium chloride  500 mL Intravenous Once  . sodium chloride  3 mL Intravenous Q12H  . sodium phosphate  1 enema Rectal Once  . thiamine  100 mg Oral Daily  . vitamin C  500 mg Oral Daily  . DISCONTD: ferrous sulfate  325 mg Oral BID  . DISCONTD: heparin  3,000 Units Intravenous Once  . DISCONTD: multivitamin  1 tablet Oral Daily  . DISCONTD: spironolactone  25 mg Oral Daily  . DISCONTD: Vitamin D3  1 capsule Oral Daily   Continuous Infusions:   . sodium chloride 50 mL/hr at 01/06/12 1610  . sodium chloride 125 mL/hr at 01/07/12 0818  . DISCONTD: heparin     PRN Meds:.albuterol, guaiFENesin-dextromethorphan, HYDROcodone-acetaminophen, LORazepam, LORazepam, ondansetron (ZOFRAN) IV, ondansetron, DISCONTD: fentaNYL, DISCONTD: midazolam  Assessment & Plan   1. 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 Dickie La who suggested that his mesenteric vein clot was ordered as per comparison with his old scans, flexible sigmoidoscopy also suggests colitis ischemic versus inflammatory - continue baby aspirin, continue telemetry monitoring, stool cultures and ova parasite studies pending, by mouth Flagyl, bowel rest, GI following Will await any further recommendations.   2. History of alcohol abuse patient says that he does not drink alcohol or regular basis anymore, he does smoke. He has  been counseled to quit both smoking and alcohol, will place him on low-dose schedule Librium along with and Ativan IV when necessary per CIWA protocol. No Dts yet.     3. 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      4. Hypertension- no acute issues continue home medications.     5. Mild hyponatremia- likely dehydration due to recent fever chills, Ur Na 10, IVF increased rate, BMP in am.     6. Artifact VS 5 beat NSVT on 01-06-12 - patient was completely symptom-free and was using the bathroom, most likely artifact, troponins negative, magnesium stable, calcium has been repleted, stable on telemetry, is on beta blocker, echo has been ordered.    DVT Prophylaxis  SCDs     Procedures - CT scan abdomen pelvis, flexible sigmoidoscopy  Consults GI   Susa Raring K M.D on 01/07/2012 at 12:06 PM  Between 7am to 7pm - Pager - [phone]  After 7pm go to [email or web address] - password [email or web address]  And look for the night coverage person covering for me after hours  [facility] Office  [phone]

## 2012-01-07 NOTE — Interval H&P Note (Signed)
History and Physical Interval Note:  01/07/2012 11:13 AM  Billy [name]  has presented today for surgery, with the diagnosis of Bowel Change  ABD pain  abnormal ct scan  The various methods of treatment have been discussed with the patient and family. After consideration of risks, benefits and other options for treatment, the patient has consented to  Procedure(s) (LRB): FLEXIBLE SIGMOIDOSCOPY (N/A) as a surgical intervention .  The patient's history has been reviewed, patient examined, no change in status, stable for surgery.  I have reviewed the patients' chart and labs.  Questions were answered to the patient's satisfaction.     Lina Sar

## 2012-01-07 NOTE — Progress Notes (Signed)
Day of Surgery  Subjective: Just back from Endoscopy, with dx of acute colitis in the rectum to descending colon, unsure of the source, bx/cultures pending Has urge for bowel movement and can't control it.  Can't wait, or hold it.  Still feels very bad, no improvement from yesterday. Objective: Vital signs in last 24 hours: Temp:  [98.3 F (36.8 C)-99.3 F (37.4 C)] 98.6 F (37 C) (06/25 1000) Pulse Rate:  [83-98] 91  (06/25 1000) Resp:  [16-23] 22  (06/25 1230) BP: (125-172)/(70-104) 166/86 mmHg (06/25 1230) SpO2:  [92 %-100 %] 92 % (06/25 1230) Weight:  [79.379 kg (175 lb)-80.3 kg (177 lb 0.5 oz)] 80.3 kg (177 lb 0.5 oz) (06/24 1559) Last BM Date: 01/07/12  Afebrile, BP is up HR was also up. No labs today.  Intake/Output from previous day: 06/24 0701 - 06/25 0700 In: 360 [P.O.:360] Out: -  Intake/Output this shift: Total I/O In: -  Out: 2 [Emesis/NG output:1; Stool:1]  General appearance: alert, cooperative and no distress GI: soft, non-tender; bowel sounds normal; no masses,  no organomegaly His exam is negative.  Lab Results:   Basename 01/07/12 0152 01/06/12 0847  WBC 7.4 9.5  HGB 14.2 14.9  HCT 40.5 42.4  PLT 117* 121*    BMET  Basename 01/07/12 0152 01/06/12 0847  NA 127* 128*  K 3.4* 4.2  CL 93* 92*  CO2 23 23  GLUCOSE 107* 108*  BUN 6 10  CREATININE 0.52 0.56  CALCIUM 8.6 9.1   PT/INR  Basename 01/06/12 0845  LABPROT 12.8  INR 0.94     Lab 01/06/12 0847  AST 40*  ALT 24  ALKPHOS 103  BILITOT 0.7  PROT 7.5  ALBUMIN 3.4*     Lipase     Component Value Date/Time   LIPASE 75* 01/06/2012 0847     Studies/Results: Ct Abdomen Pelvis W Contrast  01/06/2012  *RADIOLOGY REPORT*  Clinical Data: Lower abdominal pain for 3 days, diarrhea  CT ABDOMEN AND PELVIS WITH CONTRAST  Technique:  Multidetector CT imaging of the abdomen and pelvis was performed following the standard protocol during bolus administration of intravenous contrast. Sagittal  and coronal MPR images reconstructed from axial data set.  Contrast: OMNIPAQUE IOHEXOL 300 MG/ML  SOLN Dilute oral contrast.  Comparison: None  Findings: Lung bases clear. Cirrhotic nodular appearing liver without focal mass lesion. Dependent density in gallbladder question cholelithiasis. Small amount of upper abdominal ascites with diffuse stranding of intra abdominal fat planes. Recannulization of umbilical vein.  Splenomegaly, spleen measuring 15.7 x 8.8 x 14.1 cm. No additional focal abnormalities of the spleen, pancreas, kidneys, or adrenal glands. Scattered atherosclerotic calcifications without aneurysm. Numerous normal and upper normal-sized mesenteric/retroperitoneal nodes. 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 Mild calcification is seen within the wall of the superior mesenteric and portal veins. Splenic and portal veins appear patent. Mild wall thickening identified at the colon including ascending and descending segments question colitis.  Normal appendix. No gross small bowel abnormalities identified. Numerous collaterals noted at the right pericolic and right retroperitoneum and to a lesser degree on left. No definite mass or hernia. Degenerative disc disease changes L3-L4, L4-L5, L5-S1 with spurring and bulging discs. Facet degenerative changes lower lumbar spine.  IMPRESSION: Cirrhotic appearing liver with splenomegaly, ascites, and extensive upper abdominal collaterals. 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 Small amount of calcification is seen at the walls of the portal and superior mesenteric veins raising question of sequela of prior thrombosis. Scattered  wall thickening of the colon question colitis though less likely this could be related to cirrhosis, ascites, and partial mesenteric thrombosis.  Findings called to Dr. Denton Lank on 01/06/2012 at 1100 hours.  Original Report Authenticated By: Lollie Marrow, M.D.    Medications:    . sodium  chloride   Intravenous Once  . sodium chloride   Intravenous Once  . aspirin EC  81 mg Oral Daily  . atenolol  25 mg Oral Daily  . chlordiazePOXIDE  5 mg Oral TID  . cholecalciferol  1,000 Units Oral Daily  . ferrous sulfate  325 mg Oral BID WC  . folic acid  1 mg Oral Daily  . HYDROmorphone  1 mg Intravenous Once  . magnesium sulfate 1 - 4 g bolus IVPB  2 g Intravenous Once  . metroNIDAZOLE  500 mg Oral Q8H  . mirtazapine  45 mg Oral QHS  . multivitamin with minerals  1 tablet Oral Daily  . pantoprazole  40 mg Oral Q1200  . potassium chloride  40 mEq Oral Once  . psyllium  1 packet Oral Daily  . sodium chloride  500 mL Intravenous Once  . sodium chloride  3 mL Intravenous Q12H  . sodium phosphate  1 enema Rectal Once  . thiamine  100 mg Oral Daily  . vitamin C  500 mg Oral Daily  . DISCONTD: ferrous sulfate  325 mg Oral BID  . DISCONTD: heparin  3,000 Units Intravenous Once  . DISCONTD: multivitamin  1 tablet Oral Daily  . DISCONTD: spironolactone  25 mg Oral Daily  . DISCONTD: Vitamin D3  1 capsule Oral Daily    Assessment/Plan 1. Abdominal pain with possible colitis by CT, normal WBC. History of diverticulosis, no diverticulitis. Colonoscopy April 2013 & 5 years prior to that.  2. Cirrhosis,secondary to alcohol use/splenomegaly/ alcohol use discontinued April 2013.  3. New diagnosis of partial SMV thrombus, nonocclusive.  4. History of esophageal and rectal varices.  5. History of GI bleed/Mallory-Weiss syndrome.  6. Question of cholelithiasis on CT scan.  7. Hyponatremia  8. Mild pancreatitis  9. History of GERD/GI bleed 2006.  10. Thrombocytopenia  11. 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  12. New diagnosis of mesenteric vein thrombosis.  Plan:  Med Rx for now we will continue to follow.    LOS: 1 day    Billy [name] 01/07/2012

## 2012-01-07 NOTE — Progress Notes (Signed)
CARE MANAGEMENT NOTE 01/07/2012  Patient:  Crisman,Egor A   Account Number:  [id number]  Date Initiated:  01/07/2012  Documentation initiated by:  Robson Trickey  Subjective/Objective Assessment:   pt with history of gi bleed now with confirmed severe colitis.     Action/Plan:   lives at home with spouse   Anticipated DC Date:  01/10/2012   Anticipated DC Plan:  HOME/SELF CARE  In-house referral  NA      DC Planning Services  NA      [facility] Choice  NA   Choice offered to / List presented to:  NA   DME arranged  NA      DME agency  NA     HH arranged  NA      HH agency  NA   Status of service:  In process, will continue to follow Medicare Important Message given?  NO (If response is "NO", the following Medicare IM given date fields will be blank) Date Medicare IM given:   Date Additional Medicare IM given:    Discharge Disposition:    Per UR Regulation:  Reviewed for med. necessity/level of care/duration of stay  If discussed at Long Length of Stay Meetings, dates discussed:    Comments:  [id number]/Oona Trammel,RN,BSN,CCM No discharge needs present at time of this review. Case Management [phone]

## 2012-01-08 ENCOUNTER — Encounter (HOSPITAL_COMMUNITY): Payer: Self-pay | Admitting: Internal Medicine

## 2012-01-08 DIAGNOSIS — E871 Hypo-osmolality and hyponatremia: Secondary | ICD-10-CM

## 2012-01-08 DIAGNOSIS — R109 Unspecified abdominal pain: Secondary | ICD-10-CM

## 2012-01-08 DIAGNOSIS — R188 Other ascites: Secondary | ICD-10-CM

## 2012-01-08 DIAGNOSIS — K7030 Alcoholic cirrhosis of liver without ascites: Secondary | ICD-10-CM

## 2012-01-08 LAB — BASIC METABOLIC PANEL
Calcium: 8.5 mg/dL (ref 8.4–10.5)
Chloride: 97 mEq/L (ref 96–112)
GFR calc non Af Amer: >90 mL/min (ref >90)
Glucose, Bld: 99 mg/dL (ref 70–99)

## 2012-01-08 LAB — CBC
HCT: 38.5 % — ABNORMAL LOW (ref 39.0–52.0)
MCV: 88.1 fL (ref 78.0–100.0)
RDW: 17.4 % — ABNORMAL HIGH (ref 11.5–15.5)
WBC: 5.8 10*3/uL (ref 4.0–10.5)

## 2012-01-08 LAB — OVA AND PARASITE EXAMINATION: Special Requests: NORMAL

## 2012-01-08 LAB — MAGNESIUM: Magnesium: 1.9 mg/dL (ref 1.5–2.5)

## 2012-01-08 MED ORDER — MESALAMINE 1.2 G PO TBEC
4.8000 g | DELAYED_RELEASE_TABLET | Freq: Every day | ORAL | Status: DC
  Administered 2012-01-08 – 2012-01-09 (×2): 4.8 g via ORAL
  Filled 2012-01-08 (×3): qty 4

## 2012-01-08 MED ORDER — POTASSIUM CHLORIDE CRYS ER 20 MEQ PO TBCR
20.0000 meq | EXTENDED_RELEASE_TABLET | Freq: Two times a day (BID) | ORAL | Status: DC
  Administered 2012-01-08 – 2012-01-10 (×5): 20 meq via ORAL
  Filled 2012-01-08 (×6): qty 1

## 2012-01-08 NOTE — Progress Notes (Signed)
Subjective Back pain this am  Objective:s/p flex. Sigmoidoscopy yesterday showed severe colitis of unclear etiology, possibly ischemic, biopsies pending., denies diarrhea, wants to advance diet Vital signs in last 24 hours: Temp:  [98.1 F (36.7 C)-98.7 F (37.1 C)] 98.5 F (36.9 C) (06/26 0630) Pulse Rate:  [89-98] 89  (06/26 0630) Resp:  [16-23] 18  (06/26 0630) BP: (130-172)/(68-104) 138/77 mmHg (06/26 0630) SpO2:  [92 %-100 %] 99 % (06/26 0630) Weight:  [174 lb 12.8 oz (79.289 kg)] 174 lb 12.8 oz (79.289 kg) (06/26 0630) Last BM Date: 01/07/12 General:   Alert,  pleasant, cooperative in NAD Head:  Normocephalic and atraumatic. Eyes:  Sclera clear, no icterus.   Conjunctiva pink. Mouth:  No deformity or lesions, dentition normal. Neck:  Supple; no masses or thyromegaly. Heart:  Regular rate and rhythm; no murmurs, clicks, rubs,  or gallops. Lungs:  No wheezes or rales Abdomen:  Mildly protuberant but soft, non tender, no fluid wave Msk:  Symmetrical without gross deformities. Normal posture. Pulses:  Normal pulses noted. Extremities:  Without clubbing or edema. Neurologic:  Alert and  oriented x4;  grossly normal neurologically.no asterixis Skin:  Intact without significant lesions or rashes.  Intake/Output from previous day: 06/25 0701 - 06/26 0700 In: -  Out: 3 [Emesis/NG output:1; Stool:2] Intake/Output this shift:    Lab Results:  Basename 01/08/12 0448 01/07/12 0152 01/06/12 0847  WBC 5.8 7.4 9.5  HGB 13.1 14.2 14.9  HCT 38.5* 40.5 42.4  PLT 109* 117* 121*   BMET  Basename 01/08/12 0448 01/07/12 0152 01/06/12 0847  NA 131* 127* 128*  K 3.1* 3.4* 4.2  CL 97 93* 92*  CO2 24 23 23   GLUCOSE 99 107* 108*  BUN 4* 6 10  CREATININE 0.54 0.52 0.56  CALCIUM 8.5 8.6 9.1   LFT  Basename 01/06/12 0847  PROT 7.5  ALBUMIN 3.4*  AST 40*  ALT 24  ALKPHOS 103  BILITOT 0.7  BILIDIR --  IBILI --   PT/INR  Basename 01/06/12 0845  LABPROT 12.8  INR 0.94    Hepatitis Panel No results found for this basename: HEPBSAG,HCVAB,HEPAIGM,HEPBIGM in the last 72 hours  Studies/Results: Ct Abdomen Pelvis W Contrast  01/06/2012  *RADIOLOGY REPORT*  Clinical Data: Lower abdominal pain for 3 days, diarrhea  CT ABDOMEN AND PELVIS WITH CONTRAST  Technique:  Multidetector CT imaging of the abdomen and pelvis was performed following the standard protocol during bolus administration of intravenous contrast. Sagittal and coronal MPR images reconstructed from axial data set.  Contrast: OMNIPAQUE IOHEXOL 300 MG/ML  SOLN Dilute oral contrast.  Comparison: None  Findings: Lung bases clear. Cirrhotic nodular appearing liver without focal mass lesion. Dependent density in gallbladder question cholelithiasis. Small amount of upper abdominal ascites with diffuse stranding of intra abdominal fat planes. Recannulization of umbilical vein.  Splenomegaly, spleen measuring 15.7 x 8.8 x 14.1 cm. No additional focal abnormalities of the spleen, pancreas, kidneys, or adrenal glands. Scattered atherosclerotic calcifications without aneurysm. Numerous normal and upper normal-sized mesenteric/retroperitoneal nodes. 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 Mild calcification is seen within the wall of the superior mesenteric and portal veins. Splenic and portal veins appear patent. Mild wall thickening identified at the colon including ascending and descending segments question colitis.  Normal appendix. No gross small bowel abnormalities identified. Numerous collaterals noted at the right pericolic and right retroperitoneum and to a lesser degree on left. No definite mass or hernia. Degenerative disc disease changes L3-L4, L4-L5, L5-S1 with  spurring and bulging discs. Facet degenerative changes lower lumbar spine.  IMPRESSION: Cirrhotic appearing liver with splenomegaly, ascites, and extensive upper abdominal collaterals. Partial thrombosis of the superior  mesenteric vein. Small amount of calcification is seen at the walls of the portal and superior mesenteric veins raising question of sequela of prior thrombosis. Scattered wall thickening of the colon question colitis though less likely this could be related to cirrhosis, ascites, and partial mesenteric thrombosis.  Findings called to Dr. Denton Lank on 01/06/2012 at 1100 hours.  Original Report Authenticated By: Lollie Marrow, M.D.     ASSESSMENT:   Principal Problem:  *Abdominal  pain, other specified site Active Problems:  ABUSE, ALCOHOL, UNSPECIFIED  HYPERTENSION  DIVERTICULOSIS, COLON  DEGENERATIVE DISC DISEASE, LUMBAR SPINE  BACK PAIN, CHRONIC  Personal history of colonic polyps  Esophageal varices  Portal hypertension  Angiodysplasia of colon  Rectal varices  Alcoholic cirrhosis of liver  Nonspecific (abnormal) findings on radiological and other examination of gastrointestinal tract  Nonspecific abnormal finding in stool contents  Other and unspecified noninfectious gastroenteritis and colitis     PLAN:   Await biopsy results, empirical Flagyl,  Advance to full liquids Supplement K+, 3.1 this am     LOS: 2 days   Lina Sar  01/08/2012, 7:34 AM

## 2012-01-08 NOTE — Progress Notes (Signed)
[facility]                                                                                Patient Demographics  Billy [name], is a 66 y.o. male  [id number]  [id number]  DOB - [birth year]  Admit date - 01/06/2012  Admitting Physician Leroy Sea, MD  Outpatient Primary MD for the patient is Tillman Abide, MD  LOS - 2   Chief Complaint  Patient presents with  . Abdominal Pain        Subjective:   Denies n/v, still with c/o lower abd pain, states not any worse with food. Chart reviewed  Objective:   Filed Vitals:   01/07/12 1300 01/07/12 2200 01/08/12 0630 01/08/12 1424  BP: 155/68 146/82 138/77 97/54  Pulse: 98 89 89 81  Temp: 98.7 F (37.1 C) 98.7 F (37.1 C) 98.5 F (36.9 C) 98.7 F (37.1 C)  TempSrc: Oral Oral Oral Oral  Resp: 20 20 18 20   Height:      Weight:   79.289 kg (174 lb 12.8 oz)   SpO2: 97% 97% 99% 96%    Wt Readings from Last 3 Encounters:  01/08/12 79.289 kg (174 lb 12.8 oz)  01/08/12 79.289 kg (174 lb 12.8 oz)  10/21/11 84.55 kg (186 lb 6.4 oz)     Intake/Output Summary (Last 24 hours) at 01/08/12 2231 Last data filed at 01/08/12 1921  Gross per 24 hour  Intake    390 ml  Output      6 ml  Net    384 ml    Exam Awake Alert, Oriented *3, No new F.N deficits, Normal affect [place].AT,PERRAL Supple Neck,No JVD, No cervical lymphadenopathy appriciated.  Symmetrical Chest wall movement, Good air movement bilaterally, CTAB RRR,No Gallops,Rubs or new Murmurs, No Parasternal Heave +ve B.Sounds, Abd Soft, mild tenderness to palpation, No organomegaly appriciated, No rebound -guarding or rigidity. No Cyanosis, Clubbing or edema, No new Rash or bruise    Data Review  CBC  Lab 01/08/12 0448 01/07/12 0152 01/06/12 0847  WBC 5.8 7.4 9.5  HGB 13.1 14.2 14.9  HCT 38.5* 40.5 42.4  PLT 109* 117* 121*  MCV 88.1 87.9 88.7  MCH 30.0 30.8 31.2  MCHC 34.0 35.1 35.1  RDW 17.4* 17.5* 17.8*  LYMPHSABS -- -- 0.8  MONOABS  -- -- 0.9  EOSABS -- -- 0.0  BASOSABS -- -- 0.0  BANDABS -- -- --    Chemistries   Lab 01/08/12 0448 01/07/12 0152 01/06/12 1820 01/06/12 0847  NA 131* 127* -- 128*  K 3.1* 3.4* -- 4.2  CL 97 93* -- 92*  CO2 24 23 -- 23  GLUCOSE 99 107* -- 108*  BUN 4* 6 -- 10  CREATININE 0.54 0.52 -- 0.56  CALCIUM 8.5 8.6 -- 9.1  MG 1.9 -- 1.7 --  AST -- -- -- 40*  ALT -- -- -- 24  ALKPHOS -- -- -- 103  BILITOT -- -- -- 0.7   ------------------------------------------------------------------------------------------------------------------ estimated creatinine clearance is 106 ml/min (by C-G formula based on Cr of 0.54). ------------------------------------------------------------------------------------------------------------------ No results found for this basename: HGBA1C:2 in the last 72 hours ------------------------------------------------------------------------------------------------------------------ No results found for  this basename: CHOL:2,HDL:2,LDLCALC:2,TRIG:2,CHOLHDL:2,LDLDIRECT:2 in the last 72 hours ------------------------------------------------------------------------------------------------------------------  [facility] 01/06/12 1820  TSH 1.547  T4TOTAL --  T3FREE --  THYROIDAB --   ------------------------------------------------------------------------------------------------------------------ No results found for this basename: VITAMINB12:2,FOLATE:2,FERRITIN:2,TIBC:2,IRON:2,RETICCTPCT:2 in the last 72 hours  Coagulation profile  Lab 01/06/12 0845  INR 0.94  PROTIME --    No results found for this basename: DDIMER:2 in the last 72 hours  Cardiac Enzymes  Lab 01/07/12 0935 01/07/12 0152 01/06/12 1820  CKMB 2.1 2.1 2.0  TROPONINI <0.30 <0.30 <0.30  MYOGLOBIN -- -- --   ------------------------------------------------------------------------------------------------------------------ No components found with this basename: POCBNP:3  Micro Results Recent  Results (from the past 240 hour(s))  CULTURE, BLOOD (ROUTINE X 2)     Status: Normal (Preliminary result)   Collection Time   01/06/12  1:47 PM      Component Value Range Status Comment   Specimen Description BLOOD LEFT ARM   Final    Special Requests BOTTLES DRAWN AEROBIC AND ANAEROBIC   Final    Culture  Setup Time [id number]   Final    Culture     Final    Value:        BLOOD CULTURE RECEIVED NO GROWTH TO DATE CULTURE WILL BE HELD FOR 5 DAYS BEFORE ISSUING A FINAL NEGATIVE REPORT   Report Status PENDING   Incomplete   CULTURE, BLOOD (ROUTINE X 2)     Status: Normal (Preliminary result)   Collection Time   01/06/12  1:51 PM      Component Value Range Status Comment   Specimen Description BLOOD RIGHT ARM   Final    Special Requests BOTTLES DRAWN AEROBIC AND ANAEROBIC   Final    Culture  Setup Time [id number]   Final    Culture     Final    Value:        BLOOD CULTURE RECEIVED NO GROWTH TO DATE CULTURE WILL BE HELD FOR 5 DAYS BEFORE ISSUING A FINAL NEGATIVE REPORT   Report Status PENDING   Incomplete   URINE CULTURE     Status: Normal   Collection Time   01/06/12  5:51 PM      Component Value Range Status Comment   Specimen Description URINE, CLEAN CATCH   Final    Special Requests Normal   Final    Culture  Setup Time [id number]   Final    Colony Count NO GROWTH   Final    Culture NO GROWTH   Final    Report Status 01/07/2012 FINAL   Final   STOOL CULTURE     Status: Normal (Preliminary result)   Collection Time   01/06/12 10:05 PM      Component Value Range Status Comment   Specimen Description STOOL   Final    Special Requests Normal   Final    Culture NO SUSPICIOUS COLONIES, CONTINUING TO HOLD   Final    Report Status PENDING   Incomplete   OVA AND PARASITE EXAMINATION     Status: Normal   Collection Time   01/06/12 10:05 PM      Component Value Range Status Comment   Specimen Description STOOL   Final    Special Requests Normal   Final    Ova and parasites  NO OVA OR PARASITES SEEN MODERATE WBC   Final    Report Status 01/08/2012 FINAL   Final   CLOSTRIDIUM DIFFICILE BY PCR     Status: Normal   Collection Time  01/06/12 10:05 PM      Component Value Range Status Comment   C difficile by pcr NEGATIVE  NEGATIVE Final     Radiology Reports Ct Abdomen Pelvis W Contrast  01/06/2012  *RADIOLOGY REPORT*  Clinical Data: Lower abdominal pain for 3 days, diarrhea  CT ABDOMEN AND PELVIS WITH CONTRAST  Technique:  Multidetector CT imaging of the abdomen and pelvis was performed following the standard protocol during bolus administration of intravenous contrast. Sagittal and coronal MPR images reconstructed from axial data set.  Contrast: OMNIPAQUE IOHEXOL 300 MG/ML  SOLN Dilute oral contrast.  Comparison: None  Findings: Lung bases clear. Cirrhotic nodular appearing liver without focal mass lesion. Dependent density in gallbladder question cholelithiasis. Small amount of upper abdominal ascites with diffuse stranding of intra abdominal fat planes. Recannulization of umbilical vein.  Splenomegaly, spleen measuring 15.7 x 8.8 x 14.1 cm. No additional focal abnormalities of the spleen, pancreas, kidneys, or adrenal glands. Scattered atherosclerotic calcifications without aneurysm. Numerous normal and upper normal-sized mesenteric/retroperitoneal nodes. 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 Mild calcification is seen within the wall of the superior mesenteric and portal veins. Splenic and portal veins appear patent. Mild wall thickening identified at the colon including ascending and descending segments question colitis.  Normal appendix. No gross small bowel abnormalities identified. Numerous collaterals noted at the right pericolic and right retroperitoneum and to a lesser degree on left. No definite mass or hernia. Degenerative disc disease changes L3-L4, L4-L5, L5-S1 with spurring and bulging discs. Facet degenerative changes lower  lumbar spine.  IMPRESSION: Cirrhotic appearing liver with splenomegaly, ascites, and extensive upper abdominal collaterals. 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 Small amount of calcification is seen at the walls of the portal and superior mesenteric veins raising question of sequela of prior thrombosis. Scattered wall thickening of the colon question colitis though less likely this could be related to cirrhosis, ascites, and partial mesenteric thrombosis.  Findings called to Dr. Denton Lank on 01/06/2012 at 1100 hours.  Original Report Authenticated By: Lollie Marrow, M.D.    Scheduled Meds:    . aspirin EC  81 mg Oral Daily  . atenolol  25 mg Oral Daily  . chlordiazePOXIDE  5 mg Oral TID  . cholecalciferol  1,000 Units Oral Daily  . ferrous sulfate  325 mg Oral BID WC  . folic acid  1 mg Oral Daily  . mesalamine  4.8 g Oral Q breakfast  . metroNIDAZOLE  250 mg Oral Q6H  . mirtazapine  45 mg Oral QHS  . multivitamin with minerals  1 tablet Oral Daily  . pantoprazole  40 mg Oral Q1200  . potassium chloride  20 mEq Oral BID  . psyllium  1 packet Oral Daily  . sodium chloride  3 mL Intravenous Q12H  . thiamine  100 mg Oral Daily  . vitamin C  500 mg Oral Daily   Continuous Infusions:  PRN Meds:.albuterol, guaiFENesin-dextromethorphan, HYDROcodone-acetaminophen, LORazepam, LORazepam, ondansetron (ZOFRAN) IV, ondansetron  Assessment & Plan   1. 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 Dickie La who suggested that his mesenteric vein clot was ordered as per comparison with his old scans, flexible sigmoidoscopy also suggests colitis ischemic versus inflammatory - continue baby aspirin, continue telemetry monitoring, - stool cultures and ova parasite studies  pending,  -continue  by mouth Flagyl, bowel rest,  -GI following, awaiting bx results/ further recs.   2. History of alcohol abuse patient says that he does not drink alcohol or regular basis anymore, he does smoke. He has been counseled to quit both smoking and alcohol, will place him on low-dose schedule Librium along with and Ativan IV when necessary per CIWA protocol. -no evidence of DTs.     3. 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      4. Hypertension- no acute issues continue home medications.     5. Mild hyponatremia- likely dehydration due to recent fever chills, -improving with hydration     6. Artifact VS 5 beat NSVT on 01-06-12 - patient was completely symptom-free and was using the bathroom, most likely artifact, troponins negative, magnesium stable, calcium has been repleted, stable on telemetry, is on beta blocker, echo has been ordered.   7.Hypokalemia -replace k  DVT Prophylaxis  SCDs     Procedures - CT scan abdomen pelvis, flexible sigmoidoscopy  Consults GI   Zanobia Griebel C M.D on 01/08/2012 at 10:31 PM  Between 7am to 7pm - Pager - [phone]  After 7pm go to [email or web address] - password [email or web address]  And look for the night coverage person covering for me after hours  [facility] Office  [phone]

## 2012-01-09 ENCOUNTER — Encounter: Payer: Self-pay | Admitting: Internal Medicine

## 2012-01-09 DIAGNOSIS — R109 Unspecified abdominal pain: Secondary | ICD-10-CM

## 2012-01-09 DIAGNOSIS — K7030 Alcoholic cirrhosis of liver without ascites: Secondary | ICD-10-CM

## 2012-01-09 DIAGNOSIS — E871 Hypo-osmolality and hyponatremia: Secondary | ICD-10-CM

## 2012-01-09 DIAGNOSIS — R188 Other ascites: Secondary | ICD-10-CM

## 2012-01-09 LAB — CBC
Platelets: 131 10*3/uL — ABNORMAL LOW (ref 150–400)
RBC: 4.39 MIL/uL (ref 4.22–5.81)
RDW: 17.5 % — ABNORMAL HIGH (ref 11.5–15.5)
WBC: 6.7 10*3/uL (ref 4.0–10.5)

## 2012-01-09 LAB — BASIC METABOLIC PANEL
CO2: 24 mEq/L (ref 19–32)
Chloride: 98 mEq/L (ref 96–112)
GFR calc Af Amer: >90 mL/min (ref >90)
Potassium: 3.4 mEq/L — ABNORMAL LOW (ref 3.5–5.1)

## 2012-01-09 MED ORDER — CIPROFLOXACIN HCL 500 MG PO TABS
500.0000 mg | ORAL_TABLET | Freq: Two times a day (BID) | ORAL | Status: DC
  Administered 2012-01-09 – 2012-01-10 (×3): 500 mg via ORAL
  Filled 2012-01-09 (×5): qty 1

## 2012-01-09 NOTE — Progress Notes (Signed)
[facility]                                                                                Patient Demographics  Billy [name], is a 66 y.o. male  WUJ:[id number]  [id number]  DOB - August 10, 1945  Admit date - 01/06/2012  Admitting Physician Leroy Sea, MD  Outpatient Primary MD for the patient is Tillman Abide, MD  LOS - 3   Chief Complaint  Patient presents with  . Abdominal Pain        Subjective:   States he had a rough night with pain last pm but feels much better today -= decreased pain, and tolerating po.  Objective:   Filed Vitals:   01/08/12 2200 01/09/12 0600 01/09/12 0601 01/09/12 1345  BP: 150/79 137/65  123/79  Pulse: 85 93  81  Temp: 98.5 F (36.9 C) 98.5 F (36.9 C)  98.6 F (37 C)  TempSrc: Oral Oral  Oral  Resp: 18 18  18   Height:      Weight:   93.3 kg (205 lb 11 oz)   SpO2: 94% 94%  95%    Wt Readings from Last 3 Encounters:  01/09/12 93.3 kg (205 lb 11 oz)  01/09/12 93.3 kg (205 lb 11 oz)  10/21/11 84.55 kg (186 lb 6.4 oz)     Intake/Output Summary (Last 24 hours) at 01/09/12 1750 Last data filed at 01/09/12 1400  Gross per 24 hour  Intake    580 ml  Output      3 ml  Net    577 ml    Exam Awake Alert, Oriented *3, No new F.N deficits, Normal affect [place].AT,PERRAL Supple Neck,No JVD, No cervical lymphadenopathy appriciated.  Symmetrical Chest wall movement, Good air movement bilaterally, CTAB RRR,No Gallops,Rubs or new Murmurs, No Parasternal Heave +ve B.Sounds, Abd Soft, mild tenderness to palpation, No organomegaly appriciated, No rebound -guarding or rigidity. No Cyanosis, Clubbing or edema, No new Rash or bruise    Data Review  CBC  Lab 01/09/12 0500 01/08/12 0448 01/07/12 0152 01/06/12 0847  WBC 6.7 5.8 7.4 9.5  HGB 13.3 13.1 14.2 14.9  HCT 38.9* 38.5* 40.5 42.4  PLT 131* 109* 117* 121*  MCV 88.6 88.1 87.9 88.7  MCH 30.3 30.0 30.8 31.2  MCHC 34.2 34.0 35.1 35.1  RDW 17.5* 17.4* 17.5* 17.8*    LYMPHSABS -- -- -- 0.8  MONOABS -- -- -- 0.9  EOSABS -- -- -- 0.0  BASOSABS -- -- -- 0.0  BANDABS -- -- -- --    Chemistries   Lab 01/09/12 0500 01/08/12 0448 01/07/12 0152 01/06/12 1820 01/06/12 0847  NA 131* 131* 127* -- 128*  K 3.4* 3.1* 3.4* -- 4.2  CL 98 97 93* -- 92*  CO2 24 24 23  -- 23  GLUCOSE 99 99 107* -- 108*  BUN 4* 4* 6 -- 10  CREATININE 0.56 0.54 0.52 -- 0.56  CALCIUM 8.6 8.5 8.6 -- 9.1  MG -- 1.9 -- 1.7 --  AST -- -- -- -- 40*  ALT -- -- -- -- 24  ALKPHOS -- -- -- -- 103  BILITOT -- -- -- -- 0.7   ------------------------------------------------------------------------------------------------------------------  estimated creatinine clearance is 113 ml/min (by C-G formula based on Cr of 0.56). ------------------------------------------------------------------------------------------------------------------ No results found for this basename: HGBA1C:2 in the last 72 hours ------------------------------------------------------------------------------------------------------------------ No results found for this basename: CHOL:2,HDL:2,LDLCALC:2,TRIG:2,CHOLHDL:2,LDLDIRECT:2 in the last 72 hours ------------------------------------------------------------------------------------------------------------------  [facility] 01/06/12 1820  TSH 1.547  T4TOTAL --  T3FREE --  THYROIDAB --   ------------------------------------------------------------------------------------------------------------------ No results found for this basename: VITAMINB12:2,FOLATE:2,FERRITIN:2,TIBC:2,IRON:2,RETICCTPCT:2 in the last 72 hours  Coagulation profile  Lab 01/06/12 0845  INR 0.94  PROTIME --    No results found for this basename: DDIMER:2 in the last 72 hours  Cardiac Enzymes  Lab 01/07/12 0935 01/07/12 0152 01/06/12 1820  CKMB 2.1 2.1 2.0  TROPONINI <0.30 <0.30 <0.30  MYOGLOBIN -- -- --    ------------------------------------------------------------------------------------------------------------------ No components found with this basename: POCBNP:3  Micro Results Recent Results (from the past 240 hour(s))  CULTURE, BLOOD (ROUTINE X 2)     Status: Normal (Preliminary result)   Collection Time   01/06/12  1:47 PM      Component Value Range Status Comment   Specimen Description BLOOD LEFT ARM   Final    Special Requests BOTTLES DRAWN AEROBIC AND ANAEROBIC   Final    Culture  Setup Time [id number]   Final    Culture     Final    Value:        BLOOD CULTURE RECEIVED NO GROWTH TO DATE CULTURE WILL BE HELD FOR 5 DAYS BEFORE ISSUING A FINAL NEGATIVE REPORT   Report Status PENDING   Incomplete   CULTURE, BLOOD (ROUTINE X 2)     Status: Normal (Preliminary result)   Collection Time   01/06/12  1:51 PM      Component Value Range Status Comment   Specimen Description BLOOD RIGHT ARM   Final    Special Requests BOTTLES DRAWN AEROBIC AND ANAEROBIC   Final    Culture  Setup Time [id number]   Final    Culture     Final    Value:        BLOOD CULTURE RECEIVED NO GROWTH TO DATE CULTURE WILL BE HELD FOR 5 DAYS BEFORE ISSUING A FINAL NEGATIVE REPORT   Report Status PENDING   Incomplete   URINE CULTURE     Status: Normal   Collection Time   01/06/12  5:51 PM      Component Value Range Status Comment   Specimen Description URINE, CLEAN CATCH   Final    Special Requests Normal   Final    Culture  Setup Time [id number]   Final    Colony Count NO GROWTH   Final    Culture NO GROWTH   Final    Report Status 01/07/2012 FINAL   Final   STOOL CULTURE     Status: Normal (Preliminary result)   Collection Time   01/06/12 10:05 PM      Component Value Range Status Comment   Specimen Description STOOL   Final    Special Requests Normal   Final    Culture NO SUSPICIOUS COLONIES, CONTINUING TO HOLD   Final    Report Status PENDING   Incomplete   OVA AND PARASITE EXAMINATION      Status: Normal   Collection Time   01/06/12 10:05 PM      Component Value Range Status Comment   Specimen Description STOOL   Final    Special Requests Normal   Final    Ova and parasites NO OVA OR PARASITES  SEEN MODERATE WBC   Final    Report Status 01/08/2012 FINAL   Final   CLOSTRIDIUM DIFFICILE BY PCR     Status: Normal   Collection Time   01/06/12 10:05 PM      Component Value Range Status Comment   C difficile by pcr NEGATIVE  NEGATIVE Final     Radiology Reports Ct Abdomen Pelvis W Contrast  01/06/2012  *RADIOLOGY REPORT*  Clinical Data: Lower abdominal pain for 3 days, diarrhea  CT ABDOMEN AND PELVIS WITH CONTRAST  Technique:  Multidetector CT imaging of the abdomen and pelvis was performed following the standard protocol during bolus administration of intravenous contrast. Sagittal and coronal MPR images reconstructed from axial data set.  Contrast: OMNIPAQUE IOHEXOL 300 MG/ML  SOLN Dilute oral contrast.  Comparison: None  Findings: Lung bases clear. Cirrhotic nodular appearing liver without focal mass lesion. Dependent density in gallbladder question cholelithiasis. Small amount of upper abdominal ascites with diffuse stranding of intra abdominal fat planes. Recannulization of umbilical vein.  Splenomegaly, spleen measuring 15.7 x 8.8 x 14.1 cm. No additional focal abnormalities of the spleen, pancreas, kidneys, or adrenal glands. Scattered atherosclerotic calcifications without aneurysm. Numerous normal and upper normal-sized mesenteric/retroperitoneal nodes. 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 Mild calcification is seen within the wall of the superior mesenteric and portal veins. Splenic and portal veins appear patent. Mild wall thickening identified at the colon including ascending and descending segments question colitis.  Normal appendix. No gross small bowel abnormalities identified. Numerous collaterals noted at the right pericolic and right  retroperitoneum and to a lesser degree on left. No definite mass or hernia. Degenerative disc disease changes L3-L4, L4-L5, L5-S1 with spurring and bulging discs. Facet degenerative changes lower lumbar spine.  IMPRESSION: Cirrhotic appearing liver with splenomegaly, ascites, and extensive upper abdominal collaterals. 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 Small amount of calcification is seen at the walls of the portal and superior mesenteric veins raising question of sequela of prior thrombosis. Scattered wall thickening of the colon question colitis though less likely this could be related to cirrhosis, ascites, and partial mesenteric thrombosis.  Findings called to Dr. Denton Lank on 01/06/2012 at 1100 hours.  Original Report Authenticated By: Lollie Marrow, M.D.    Scheduled Meds:    . aspirin EC  81 mg Oral Daily  . atenolol  25 mg Oral Daily  . chlordiazePOXIDE  5 mg Oral TID  . cholecalciferol  1,000 Units Oral Daily  . ciprofloxacin  500 mg Oral BID  . folic acid  1 mg Oral Daily  . metroNIDAZOLE  250 mg Oral Q6H  . mirtazapine  45 mg Oral QHS  . multivitamin with minerals  1 tablet Oral Daily  . pantoprazole  40 mg Oral Q1200  . potassium chloride  20 mEq Oral BID  . sodium chloride  3 mL Intravenous Q12H  . thiamine  100 mg Oral Daily  . vitamin C  500 mg Oral Daily  . DISCONTD: ferrous sulfate  325 mg Oral BID WC  . DISCONTD: mesalamine  4.8 g Oral Q breakfast  . DISCONTD: psyllium  1 packet Oral Daily   Continuous Infusions:  PRN Meds:.albuterol, guaiFENesin-dextromethorphan, HYDROcodone-acetaminophen, LORazepam, LORazepam, ondansetron (ZOFRAN) IV, ondansetron  Assessment & Plan   1. Abdominal pain in a patient with history of alcoholic cirrhosis, esophageal and rectal varices, Angiodysplasia of colon, ascites, questionable superior mesenteric artery clot versus colitis - flexible sigmoidoscopy suggestive of colitis, discussed the case in detail  with Dr. Juanda Chance who  suggested that his mesenteric vein clot was ordered as per comparison with his old scans, flexible sigmoidoscopy also suggests colitis ischemic versus inflammatory -Path per GI c/w infectious etiology - stool cultures and ova parasite studies-NGTD,  -continue by mouth Flagyl, cipro  -per GI if tolerates low residue diet will plan to d/c in am   2. History of alcohol abuse patient says that he does not drink alcohol or regular basis anymore, he does smoke. He has been counseled to quit both smoking and alcohol, will place him on low-dose schedule Librium along with and Ativan IV when necessary per CIWA protocol. -no evidence of DTs.     3. 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      4. Hypertension- no acute issues continue home medications.     5. Mild hyponatremia- likely dehydration due to recent fever chills, -improving with hydration     6. Artifact VS 5 beat NSVT on 01-06-12 - patient was completely symptom-free and was using the bathroom, most likely artifact, troponins negative, magnesium stable, calcium has been repleted, stable on telemetry, is on beta blocker, echo has been ordered.   7.Hypokalemia -replace k  DVT Prophylaxis  SCDs     Procedures - CT scan abdomen pelvis, flexible sigmoidoscopy  Consults GI   Eugina Row C M.D on 01/09/2012 at 5:50 PM  Between 7am to 7pm - Pager - [phone]  After 7pm go to [email or web address] - password [email or web address]  And look for the night coverage person covering for me after hours  [facility] Office  [phone]

## 2012-01-09 NOTE — Progress Notes (Signed)
Colon biopsies reviewed with Dr Luisa Hart- most consistent with infectious colitis ( no crypt abscesses or chronic inflammation). His stool cultures preliminary from 01/06/2012 were negative. He is on Flagyl 250 mg po tid. Will add oral Cipro 500 bid. If low residue diet tolerated today, he may be discharged tomorrow on Hydrocodone 5/325 1-2 q 4-6 hours which has been holding him in the hospital.

## 2012-01-09 NOTE — Progress Notes (Signed)
Subjective Feeling better, diarrhea continues, no blood in stools, abd. pain improved, slept well  Objective:BS's stable, colon biopsies are suggestive of IBD/UC, Dr Luisa Hart id getting deeper cuts, final report will be available this morning. He was started on Lialda 4.8 gm/day Vital signs in last 24 hours: Temp:  [98.5 F (36.9 C)-98.7 F (37.1 C)] 98.5 F (36.9 C) (06/27 0600) Pulse Rate:  [81-93] 93  (06/27 0600) Resp:  [18-20] 18  (06/27 0600) BP: (97-150)/(54-79) 137/65 mmHg (06/27 0600) SpO2:  [94 %-96 %] 94 % (06/27 0600) Weight:  [205 lb 11 oz (93.3 kg)] 205 lb 11 oz (93.3 kg) (06/27 0601) Last BM Date: 01/08/12 General:   Alert,  pleasant, cooperative in NAD Head:  Normocephalic and atraumatic. Eyes:  Sclera clear, no icterus.   Conjunctiva pink. Mouth:  No deformity or lesions, dentition normal. Neck:  Supple; no masses or thyromegaly. Heart:  Regular rate and rhythm; no murmurs, clicks, rubs,  or gallops. Lungs:  No wheezes or rales Abdomen: soft, normal B.S's, tender LLQ- improved Msk:  Symmetrical without gross deformities. Normal posture. Pulses:  Normal pulses noted. Extremities:  Without clubbing or edema. Neurologic:  Alert and  oriented x4;  grossly normal neurologically. Skin:  Intact without significant lesions or rashes.  Intake/Output from previous day: 06/26 0701 - 06/27 0700 In: 490 [P.O.:490] Out: 6 [Urine:1; Stool:5] Intake/Output this shift:    Lab Results:  Basename 01/09/12 0500 01/08/12 0448 01/07/12 0152  WBC 6.7 5.8 7.4  HGB 13.3 13.1 14.2  HCT 38.9* 38.5* 40.5  PLT 131* 109* 117*   BMET  Basename 01/09/12 0500 01/08/12 0448 01/07/12 0152  NA 131* 131* 127*  K 3.4* 3.1* 3.4*  CL 98 97 93*  CO2 24 24 23   GLUCOSE 99 99 107*  BUN 4* 4* 6  CREATININE 0.56 0.54 0.52  CALCIUM 8.6 8.5 8.6   LFT  Basename 01/06/12 0847  PROT 7.5  ALBUMIN 3.4*  AST 40*  ALT 24  ALKPHOS 103  BILITOT 0.7  BILIDIR --  IBILI --    PT/INR  Basename 01/06/12 0845  LABPROT 12.8  INR 0.94   Hepatitis Panel No results found for this basename: HEPBSAG,HCVAB,HEPAIGM,HEPBIGM in the last 72 hours  Studies/Results: No results found.   ASSESSMENT:   Principal Problem:  *Abdominal  pain, other specified site Active Problems:  ABUSE, ALCOHOL, UNSPECIFIED  HYPERTENSION  DIVERTICULOSIS, COLON  DEGENERATIVE DISC DISEASE, LUMBAR SPINE  BACK PAIN, CHRONIC  Personal history of colonic polyps  Esophageal varices  Portal hypertension  Angiodysplasia of colon  Rectal varices  Alcoholic cirrhosis of liver  Nonspecific (abnormal) findings on radiological and other examination of gastrointestinal tract  Nonspecific abnormal finding in stool contents  Other and unspecified noninfectious gastroenteritis and colitis     PLAN:   Awaiting final Path report on colon biopsies, which are suggestive of  IBD, if the report this morning confirms UC we will start on oral steroids in addition to Mesalamine 4.8 gm/day. OK to advance diet.     LOS: 3 days   Lina Sar  01/09/2012, 7:35 AM

## 2012-01-10 ENCOUNTER — Telehealth: Payer: Self-pay | Admitting: Internal Medicine

## 2012-01-10 DIAGNOSIS — K7030 Alcoholic cirrhosis of liver without ascites: Secondary | ICD-10-CM

## 2012-01-10 DIAGNOSIS — E871 Hypo-osmolality and hyponatremia: Secondary | ICD-10-CM

## 2012-01-10 DIAGNOSIS — R188 Other ascites: Secondary | ICD-10-CM

## 2012-01-10 DIAGNOSIS — R109 Unspecified abdominal pain: Secondary | ICD-10-CM

## 2012-01-10 LAB — CBC
HCT: 38.6 % — ABNORMAL LOW (ref 39.0–52.0)
Hemoglobin: 13 g/dL (ref 13.0–17.0)
RBC: 4.34 MIL/uL (ref 4.22–5.81)
WBC: 6.3 10*3/uL (ref 4.0–10.5)

## 2012-01-10 LAB — BASIC METABOLIC PANEL
BUN: 5 mg/dL — ABNORMAL LOW (ref 6–23)
CO2: 26 mEq/L (ref 19–32)
Chloride: 100 mEq/L (ref 96–112)
GFR calc Af Amer: >90 mL/min (ref >90)
Glucose, Bld: 103 mg/dL — ABNORMAL HIGH (ref 70–99)
Potassium: 3.2 mEq/L — ABNORMAL LOW (ref 3.5–5.1)

## 2012-01-10 MED ORDER — POTASSIUM CHLORIDE CRYS ER 20 MEQ PO TBCR
60.0000 meq | EXTENDED_RELEASE_TABLET | Freq: Once | ORAL | Status: AC
  Administered 2012-01-10: 60 meq via ORAL
  Filled 2012-01-10: qty 3

## 2012-01-10 MED ORDER — METRONIDAZOLE 250 MG PO TABS: 250.0000 mg | ORAL_TABLET | Freq: Three times a day (TID) | ORAL | Status: AC

## 2012-01-10 MED ORDER — HYDROCODONE-ACETAMINOPHEN 5-325 MG PO TABS: 1.0000 | ORAL_TABLET | ORAL | Status: DC | PRN

## 2012-01-10 MED ORDER — CIPROFLOXACIN HCL 250 MG PO TABS: 250.0000 mg | ORAL_TABLET | Freq: Two times a day (BID) | ORAL | Status: AC

## 2012-01-10 MED ORDER — THIAMINE HCL 100 MG PO TABS: 100.0000 mg | ORAL_TABLET | Freq: Every day | ORAL | Status: DC

## 2012-01-10 MED ORDER — ASPIRIN 81 MG PO TBEC: 81.0000 mg | DELAYED_RELEASE_TABLET | Freq: Every day | ORAL | Status: DC

## 2012-01-10 NOTE — Telephone Encounter (Signed)
Patient advised that he needs a post hospital appt for 01/27/12 at 10:15

## 2012-01-10 NOTE — Discharge Summary (Signed)
Discharge Note  Name: Billy [name] MRN: [id number] DOB: May 19, 1946 66 y.o.  Date of Admission: 01/06/2012  8:05 AM Date of Discharge: 01/10/2012 Attending Physician: Kela Millin, MD  Discharge Diagnosis: Principal Problem:  *Abdominal  pain, other specified site/colitis, infectious Active Problems:  ABUSE, ALCOHOL, UNSPECIFIED  HYPERTENSION  DIVERTICULOSIS, COLON  DEGENERATIVE DISC DISEASE, LUMBAR SPINE  BACK PAIN, CHRONIC  Personal history of colonic polyps  Esophageal varices  Portal hypertension  Angiodysplasia of colon  Rectal varices  Alcoholic cirrhosis of liver  Nonspecific (abnormal) findings on radiological and other examination of gastrointestinal tract  Nonspecific abnormal finding in stool contents  Other and unspecified noninfectious gastroenteritis and colitis Hypokalemia  Discharge Medications: Medication List  As of 01/10/2012 10:53 AM   STOP taking these medications         psyllium 58.6 % packet         TAKE these medications         aspirin 81 MG EC tablet   Take 1 tablet (81 mg total) by mouth daily.      atenolol 25 MG tablet   Commonly known as: TENORMIN   Take 1 tablet (25 mg total) by mouth daily.      ciprofloxacin 250 MG tablet   Commonly known as: CIPRO   Take 1 tablet (250 mg total) by mouth 2 (two) times daily.      ferrous sulfate 325 (65 FE) MG EC tablet   Take 1 tablet (325 mg total) by mouth 2 (two) times daily.      HYDROcodone-acetaminophen 5-325 MG per tablet   Commonly known as: NORCO   Take 1 tablet by mouth every 4 (four) hours as needed.      metroNIDAZOLE 250 MG tablet   Commonly known as: FLAGYL   Take 1 tablet (250 mg total) by mouth 3 (three) times daily.      mirtazapine 45 MG tablet   Commonly known as: REMERON   TAKE ONE BY MOUTH AT BEDTIME      multivitamin tablet   Take 1 tablet by mouth daily.      NON FORMULARY   Mega Red once daily.      omeprazole 40 MG capsule   Commonly known as:  PRILOSEC   Take 1 capsule (40 mg total) by mouth daily.      spironolactone 25 MG tablet   Commonly known as: ALDACTONE   Take 1 tablet (25 mg total) by mouth daily.      thiamine 100 MG tablet   Take 1 tablet (100 mg total) by mouth daily.      VIAGRA 50 MG tablet   Generic drug: sildenafil   TAKE ONE BY MOUTH AS DIRECTED      vitamin C 500 MG tablet   Commonly known as: ASCORBIC ACID   Take 500 mg by mouth daily.      Vitamin D3 1000 UNITS Caps   Take 1 capsule by mouth daily.            Disposition and follow-up:   Mr.Billy [name] was discharged from [facility] in improved/stable condition.    Follow-up Appointments: Discharge Orders    Future Appointments: Provider: Department: Dept Phone: Center:   02/06/2012 1:30 PM Iva Boop, MD [facility] Office [phone] [facility]   02/10/2012 3:45 PM Karie Schwalbe, MD [facility] [phone] [id number]     Future Orders Please Complete By Expires   Diet low fiber  Increase activity slowly         Consultations: Treatment Team:  Hart Carwin, MD  Procedures Performed:  Ct Abdomen Pelvis W Contrast  01/06/2012  *RADIOLOGY REPORT*  Clinical Data: Lower abdominal pain for 3 days, diarrhea  CT ABDOMEN AND PELVIS WITH CONTRAST  Technique:  Multidetector CT imaging of the abdomen and pelvis was performed following the standard protocol during bolus administration of intravenous contrast. Sagittal and coronal MPR images reconstructed from axial data set.  Contrast: OMNIPAQUE IOHEXOL 300 MG/ML  SOLN Dilute oral contrast.  Comparison: None  Findings: Lung bases clear. Cirrhotic nodular appearing liver without focal mass lesion. Dependent density in gallbladder question cholelithiasis. Small amount of upper abdominal ascites with diffuse stranding of intra abdominal fat planes. Recannulization of umbilical vein.  Splenomegaly, spleen measuring 15.7 x 8.8 x 14.1 cm. No additional focal  abnormalities of the spleen, pancreas, kidneys, or adrenal glands. Scattered atherosclerotic calcifications without aneurysm. Numerous normal and upper normal-sized mesenteric/retroperitoneal nodes. 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 Mild calcification is seen within the wall of the superior mesenteric and portal veins. Splenic and portal veins appear patent. Mild wall thickening identified at the colon including ascending and descending segments question colitis.  Normal appendix. No gross small bowel abnormalities identified. Numerous collaterals noted at the right pericolic and right retroperitoneum and to a lesser degree on left. No definite mass or hernia. Degenerative disc disease changes L3-L4, L4-L5, L5-S1 with spurring and bulging discs. Facet degenerative changes lower lumbar spine.  IMPRESSION: Cirrhotic appearing liver with splenomegaly, ascites, and extensive upper abdominal collaterals. 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 Small amount of calcification is seen at the walls of the portal and superior mesenteric veins raising question of sequela of prior thrombosis. Scattered wall thickening of the colon question colitis though less likely this could be related to cirrhosis, ascites, and partial mesenteric thrombosis.  Findings called to Dr. Denton Lank on 01/06/2012 at 1100 hours.  Original Report Authenticated By: Lollie Marrow, M.D.    2D Echo Study Conclusions  - Left ventricle: The cavity size was normal. Wall thickness was normal. Systolic function was normal. The estimated ejection fraction was in the range of 60% to 65%. Doppler parameters are consistent with abnormal left ventricular relaxation (grade 1 diastolic dysfunction), possibly normal for age. The E/e' ratio is <10, suggesting normal LV filling pressure. - Left atrium: The atrium was normal in size. - Right atrium: The atrium is borderline dilated. - Inferior vena cava: The  vessel was normal in size; the respirophasic diameter changes were in the normal range (= 50%); findings are consistent with normal central venous pressure. Transthoracic echocardiography.     Admission HPI Billy [name] is a 66 y.o. male, with history of alcohol abuse, patient says that he quit regular alcohol intake few months ago and now drinks a bottle of beer every 3-4 days, who was recently diagnosed with alcoholic cirrhosis by Dr. Stan Head few months ago, he also carries a diagnosis of esophageal and rectal varices. 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 She denies noticing any blood or mucus in his stools, denies any diarrhea denies any nausea or vomiting.  He presented to the ER today where CT scan of his abdomen was suspicious for superior mesenteric vein clot unclear acute versus chronic, cirrhosis of liver, small amounts of ascites not enough fluid to be tapped per radiologist, and colonic thickening which was secondary to a ascites versus clot versus infection. The ER physician discussed the case with gastroenterologist on call who recommended internal medicine admission.  Exam  Awake Alert, Oriented *3, No new F.N deficits, Normal affect  Kim[place]ly.AT,PERRAL  Supple Neck,No JVD, No cervical lymphadenopathy appriciated.  Symmetrical Chest wall movement, Good air movement bilaterally, CTAB  RRR,No Gallops,Rubs or new Murmurs, No Parasternal Heave  +ve B.Sounds, Abd Soft, nontender, No organomegaly appriciated, No rebound -guarding or rigidity.  No Cyanosis, Clubbing or edema, No new Rash or bruise   Hospital Course by problem list: Principal  Problem:  *Abdominal  pain, other specified site Active Problems:  ABUSE, ALCOHOL, UNSPECIFIED  HYPERTENSION  DIVERTICULOSIS, COLON  DEGENERATIVE DISC DISEASE, LUMBAR SPINE  BACK PAIN, CHRONIC  Personal history of colonic polyps  Esophageal varices  Portal hypertension  Angiodysplasia of colon  Rectal varices  Alcoholic cirrhosis of liver  Nonspecific (abnormal) findings on radiological and other examination of gastrointestinal tract  Nonspecific abnormal finding in stool contents  Other and unspecified noninfectious gastroenteritis and colitis  1. Abdominal pain/colitis, infectious  Pt  with history of alcoholic cirrhosis, esophageal and rectal varices, Angiodysplasia of colon, ascites, and questionable superior mesenteric artery clot versus colitis per CT; flexible sigmoidoscopy suggestive of colitis, discussed the case in detail with Dr. 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 -As he improved he was placed on by mouth which as tolerated to be discharged on a low-residue diet to follow up outpatient with Dr. Leone Payor in 2wks 2. History of alcohol abuse patient says that he does not drink alcohol or regular basis anymore, he does smoke. He was counseled to quit both smoking and alcohol, and was placed on low-dose schedule Librium along with and Ativan IV when necessary per CIWA protocol.  -He had no evidence of DTs or in his hospital stay.  3. 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  4. Hypertension- no acute issues during this hospital stay he is to continue home medications.  5. Mild hyponatremia- likely dehydration , improved with hydration 6. Artifact VS 5 beat NSVT on 01-06-12 and 6/28- patient was completely symptom-free and was using the bathroom during one of those, most likely artifact, troponins negative, magnesium stable, calcium was  repleted, he remained stable on telemetry, is on beta blocker, echo was done and showed an EF of 60-65%  and grade 1 diastolic dysfunction.  7. Hypokalemia   his potassium was replaced while in the hospital, followup outpt.  Discharge Vitals:  BP 117/59  Pulse 83  Temp 98.5 F (36.9 C) (Oral)  Resp 18  Ht 6\' 3"  (1.905 m)  Wt 83.7 kg (184 lb 8.4 oz)  BMI 23.06 kg/m2  SpO2 93%  Discharge Labs:  Results for orders placed during the hospital encounter of 01/06/12 (from the past 24 hour(s))  CBC     Status: Abnormal   Collection Time   01/10/12  4:53 AM      Component Value Range   WBC 6.3  4.0 - 10.5 K/uL   RBC 4.34  4.22 - 5.81 MIL/uL   Hemoglobin 13.0  13.0 - 17.0 g/dL   HCT 45.4 (*) 09.8 - 11.9 %   MCV 88.9  78.0 - 100.0 fL   MCH 30.0  26.0 - 34.0 pg   MCHC 33.7  30.0 - 36.0 g/dL   RDW 14.7 (*) 82.9 - 56.2 %   Platelets 147 (*) 150 - 400 K/uL  BASIC METABOLIC PANEL     Status: Abnormal   Collection Time   01/10/12  4:53 AM      Component Value Range   Sodium 133 (*) 135 - 145 mEq/L   Potassium 3.2 (*) 3.5 - 5.1 mEq/L   Chloride 100  96 - 112 mEq/L   CO2 26  19 - 32 mEq/L   Glucose, Bld 103 (*) 70 - 99 mg/dL   BUN 5 (*) 6 - 23 mg/dL   Creatinine, Ser 1.30  0.50 - 1.35 mg/dL   Calcium 8.5  8.4 - 86.5 mg/dL   GFR calc non Af Amer >90  >90 mL/min   GFR calc Af Amer >90  >90 mL/min    Signed: Tyliah Schlereth C 01/10/2012, 10:53 AM

## 2012-01-10 NOTE — Progress Notes (Signed)
Subjective I am ready to go home  Objective:minimal abdomonal pain, soft stools, tolerating low residue diet, Vital signs in last 24 hours: Temp:  [98.5 F (36.9 C)-99 F (37.2 C)] 98.5 F (36.9 C) (06/28 0600) Pulse Rate:  [81-83] 83  (06/28 0600) Resp:  [18] 18  (06/28 0600) BP: (117-136)/(59-79) 117/59 mmHg (06/28 0600) SpO2:  [93 %-95 %] 93 % (06/28 0600) Weight:  [184 lb 8.4 oz (83.7 kg)] 184 lb 8.4 oz (83.7 kg) (06/28 0600) Last BM Date: 01/10/12 General:   Alert,  pleasant, cooperative in NAD Head:  Normocephalic and atraumatic. Eyes:  Sclera clear, no icterus.   Conjunctiva pink. Mouth:  No deformity or lesions, dentition normal. Neck:  Supple; no masses or thyromegaly. Heart:  Regular rate and rhythm; no murmurs, clicks, rubs,  or gallops. Lungs:  No wheezes or rales Abdomensoft , normal B.S's Msk:  Symmetrical without gross deformities. Normal posture. Pulses:  Normal pulses noted. Extremities:  Without clubbing or edema. Neurologic:  Alert and  oriented x4;  grossly normal neurologically. Skin:  Intact without significant lesions or rashes.  Intake/Output from previous day: 06/27 0701 - 06/28 0700 In: 480 [P.O.:480] Out: 2 [Stool:2] Intake/Output this shift:    Lab Results:  Basename 01/10/12 0453 01/09/12 0500 01/08/12 0448  WBC 6.3 6.7 5.8  HGB 13.0 13.3 13.1  HCT 38.6* 38.9* 38.5*  PLT 147* 131* 109*   BMET  Basename 01/10/12 0453 01/09/12 0500 01/08/12 0448  NA 133* 131* 131*  K 3.2* 3.4* 3.1*  CL 100 98 97  CO2 26 24 24   GLUCOSE 103* 99 99  BUN 5* 4* 4*  CREATININE 0.56 0.56 0.54  CALCIUM 8.5 8.6 8.5   LFT No results found for this basename: PROT,ALBUMIN,AST,ALT,ALKPHOS,BILITOT,BILIDIR,IBILI in the last 72 hours PT/INR No results found for this basename: LABPROT:2,INR:2 in the last 72 hours Hepatitis Panel No results found for this basename: HEPBSAG,HCVAB,HEPAIGM,HEPBIGM in the last 72 hours  Studies/Results: No results  found.   ASSESSMENT:   Principal Problem:  *Abdominal  pain, other specified site Active Problems:  ABUSE, ALCOHOL, UNSPECIFIED  HYPERTENSION  DIVERTICULOSIS, COLON  DEGENERATIVE DISC DISEASE, LUMBAR SPINE  BACK PAIN, CHRONIC  Personal history of colonic polyps  Esophageal varices  Portal hypertension  Angiodysplasia of colon  Rectal varices  Alcoholic cirrhosis of liver  Nonspecific (abnormal) findings on radiological and other examination of gastrointestinal tract  Nonspecific abnormal finding in stool contents  Other and unspecified noninfectious gastroenteritis and colitis     PLAN:   OK to discharge today Low residue diet Cipro 250 mg po bid x 5 more days Flagyl 250 mg po tid x 5 more days Vicodin 5/325 prn pain,  Follow up appointm with dr Leone Payor in 2 weeks     LOS: 4 days   Lina Sar  01/10/2012, 8:14 AM

## 2012-01-12 LAB — CULTURE, BLOOD (ROUTINE X 2): Culture: NO GROWTH

## 2012-01-15 LAB — STOOL CULTURE

## 2012-01-24 ENCOUNTER — Other Ambulatory Visit: Payer: Self-pay | Admitting: *Deleted

## 2012-01-24 NOTE — Telephone Encounter (Signed)
They are the same med If he got Rx from hospital, then that counts and we can't refill till he is due again This type of request is a red flag that he has issues of dependence---at least We probably should discuss this in the office before it is refilled ever again here

## 2012-01-24 NOTE — Telephone Encounter (Signed)
Called patient to ask about refill request for Hydrocodone 5-325 take 1 tid, and on 01/10/12 pt was sent home from the hospital with the same script #90 take 1 qid and per pt he wanted a separate rx so the 2 rx's would get mixed up, pt takes one for back pain and the other for stomach pain. I advised he couldn't have 2 rx's of the same med. Please advise, he should be due 02/09/12

## 2012-01-24 NOTE — Telephone Encounter (Signed)
Spoke with patient and advised results, he has appt on 01/29/2012

## 2012-01-27 ENCOUNTER — Other Ambulatory Visit: Payer: Self-pay

## 2012-01-27 ENCOUNTER — Encounter: Payer: Self-pay | Admitting: Internal Medicine

## 2012-01-27 ENCOUNTER — Other Ambulatory Visit (INDEPENDENT_AMBULATORY_CARE_PROVIDER_SITE_OTHER): Payer: 59

## 2012-01-27 ENCOUNTER — Ambulatory Visit (INDEPENDENT_AMBULATORY_CARE_PROVIDER_SITE_OTHER): Payer: 59 | Admitting: Internal Medicine

## 2012-01-27 ENCOUNTER — Other Ambulatory Visit: Payer: Self-pay | Admitting: *Deleted

## 2012-01-27 VITALS — BP 132/60 | HR 80 | Ht 75.0 in | Wt 177.0 lb

## 2012-01-27 DIAGNOSIS — D500 Iron deficiency anemia secondary to blood loss (chronic): Secondary | ICD-10-CM

## 2012-01-27 DIAGNOSIS — D509 Iron deficiency anemia, unspecified: Secondary | ICD-10-CM

## 2012-01-27 DIAGNOSIS — K7030 Alcoholic cirrhosis of liver without ascites: Secondary | ICD-10-CM

## 2012-01-27 DIAGNOSIS — E876 Hypokalemia: Secondary | ICD-10-CM

## 2012-01-27 DIAGNOSIS — R188 Other ascites: Secondary | ICD-10-CM

## 2012-01-27 DIAGNOSIS — A045 Campylobacter enteritis: Secondary | ICD-10-CM

## 2012-01-27 LAB — FERRITIN: Ferritin: 52.9 ng/mL (ref 22.0–322.0)

## 2012-01-27 LAB — BASIC METABOLIC PANEL
BUN: 9 mg/dL (ref 6–23)
GFR: 111.49 mL/min (ref >60.00)
Potassium: 4.3 mEq/L (ref 3.5–5.1)

## 2012-01-27 MED ORDER — FERROUS SULFATE 325 (65 FE) MG PO TBEC: 325.0000 mg | DELAYED_RELEASE_TABLET | Freq: Every day | ORAL | Status: DC

## 2012-01-27 NOTE — Progress Notes (Signed)
Quick Note:  Labs look ok Ferritin - iron is normal but needs to go highre  Reduce ferrous sulfate to 1/ day please ______

## 2012-01-27 NOTE — Assessment & Plan Note (Signed)
Stable Abstinent F/u 6 months

## 2012-01-27 NOTE — Progress Notes (Signed)
  Subjective:    Patient ID: Billy [name], male    DOB: 14-Dec-1945, 66 y.o.   MRN: [id number]  HPI Patient returns for followup after he was admitted to the hospital a diarrheal illness. He ended up having Campylobacter culture from the stool. He responded nicely the Cipro and has no abdominal pain or diarrhea. He is asking if he can stop the low-residue diet.  He was started on a baby aspirin daily and continues on that. He is wondering if he needs to continue this.  Thiamine was prescribed as well. He wonders if he needs that long-term.  He reports no alcohol. He is back to walking 3 miles a day. He is been selected for [employer] and says he feels up to doing that. His weight has dropped some and he is eating and wonders why. There is no swelling of the legs.  Wt Readings from Last 3 Encounters:  01/27/12 177 lb (80.287 kg)  01/10/12 184 lb 8.4 oz (83.7 kg)  01/10/12 184 lb 8.4 oz (83.7 kg)    Medications, allergies, past medical history, past surgical history, family history and social history are reviewed and updated in the EMR.   Review of Systems As per history of present illness    Objective:   Physical Exam General:  NAD Eyes:   anicteric Lungs:  clear Heart:  S1S2 no rubs, murmurs or gallops Abdomen:  soft and nontender, BS+, mild bulging of the flanks, no hepatosplenomegaly or mass Ext:   no edema    Data Reviewed:     Chemistry      Component Value Date/Time   NA 133* 01/10/2012 0453   K 3.2* 01/10/2012 0453   CL 100 01/10/2012 0453   CO2 26 01/10/2012 0453   BUN 5* 01/10/2012 0453   CREATININE 0.56 01/10/2012 0453      Component Value Date/Time   CALCIUM 8.5 01/10/2012 0453   ALKPHOS 103 01/06/2012 0847   AST 40* 01/06/2012 0847   ALT 24 01/06/2012 0847   BILITOT 0.7 01/06/2012 0847     Lab Results  Component Value Date   WBC 6.3 01/10/2012   HGB 13.0 01/10/2012   HCT 38.6* 01/10/2012   MCV 88.9 01/10/2012   PLT 147* 01/10/2012   Hospital discharge  summary, stool culture results from June 2013.       Assessment & Plan:

## 2012-01-27 NOTE — Assessment & Plan Note (Signed)
Repeat ferritin today, has been on iron supplementation. Globin normal at the end of June. Lab Results  Component Value Date   WBC 6.3 01/10/2012   HGB 13.0 01/10/2012   HCT 38.6* 01/10/2012   MCV 88.9 01/10/2012   PLT 147* 01/10/2012

## 2012-01-27 NOTE — Assessment & Plan Note (Addendum)
Resolved after treatment with ciprofloxacin.

## 2012-01-27 NOTE — Telephone Encounter (Signed)
OK to refill? Last filled on 01-10-12 by Dr. Suanne Marker.

## 2012-01-27 NOTE — Assessment & Plan Note (Signed)
Maintained low dose spironolactone

## 2012-01-27 NOTE — Patient Instructions (Addendum)
Your physician has requested that you go to the basement for the following lab work before leaving today: BMET, Ferritin  Stop your thiamine rx per Dr. Leone Payor.  Ask Dr. Alphonsus Sias about continuing your Asprin.   Thank you for choosing [facility] today.

## 2012-01-28 MED ORDER — HYDROCODONE-ACETAMINOPHEN 5-325 MG PO TABS: 1.0000 | ORAL_TABLET | Freq: Three times a day (TID) | ORAL | Status: DC | PRN

## 2012-01-28 NOTE — Telephone Encounter (Signed)
Please change back to tid prn #90 x 0 Let him know this has to last a month again

## 2012-01-28 NOTE — Telephone Encounter (Signed)
Spoke with patient and advised results rx called into pharmacy  

## 2012-01-29 ENCOUNTER — Encounter: Payer: Self-pay | Admitting: Internal Medicine

## 2012-01-29 ENCOUNTER — Ambulatory Visit (INDEPENDENT_AMBULATORY_CARE_PROVIDER_SITE_OTHER): Payer: 59 | Admitting: Internal Medicine

## 2012-01-29 VITALS — BP 122/70 | HR 76 | Temp 97.5°F | Ht 75.0 in | Wt 179.0 lb

## 2012-01-29 DIAGNOSIS — I10 Essential (primary) hypertension: Secondary | ICD-10-CM

## 2012-01-29 DIAGNOSIS — F329 Major depressive disorder, single episode, unspecified: Secondary | ICD-10-CM

## 2012-01-29 DIAGNOSIS — M549 Dorsalgia, unspecified: Secondary | ICD-10-CM

## 2012-01-29 DIAGNOSIS — K7030 Alcoholic cirrhosis of liver without ascites: Secondary | ICD-10-CM

## 2012-01-29 NOTE — Assessment & Plan Note (Signed)
Now abstinent Discussed 12 step program if he has any urges (none now)

## 2012-01-29 NOTE — Assessment & Plan Note (Signed)
Asked him to try to lower narcotic use Most tid still

## 2012-01-29 NOTE — Assessment & Plan Note (Signed)
BP Readings from Last 3 Encounters:  01/29/12 122/70  01/27/12 132/60  01/10/12 117/59   Good control On beta blocker and spironolactone mostly for liver/varices now

## 2012-01-29 NOTE — Assessment & Plan Note (Signed)
Doesn't appear to be active Will continue the mirtazapine

## 2012-01-29 NOTE — Progress Notes (Signed)
Subjective:    Patient ID: Billy [name], male    DOB: [birth year], 66 y.o.   MRN: [id number]  HPI Recent hospitalization for Campylobacter colitis Better with cipro Did get call from health department  Alcoholic cirrhosis evident with ascites No longer drinks any alcohol--no urge now. No problem stopping Ascites seems better with spironolactone  BP on low side No dizziness or syncope Trying to exercise regularly--- 3 mile walk daily plus some light weight work No chest pain No SOB  Ongoing back pain Back to tid with hydrocodone for this Discussed trying to limit his use of this  No heartburn Still on the omeprazole All abdominal cramping is now resolved  Not feeling depressed Continues the mirtazapine May have self medicated with alcohol to some extent  Current Outpatient Prescriptions on File Prior to Visit  Medication Sig Dispense Refill  . aspirin EC 81 MG EC tablet Take 1 tablet (81 mg total) by mouth daily.  30 tablet  0  . atenolol (TENORMIN) 25 MG tablet Take 1 tablet (25 mg total) by mouth daily.  90 tablet  3  . Cholecalciferol (VITAMIN D3) 1000 UNITS CAPS Take 1 capsule by mouth daily.      . ferrous sulfate 325 (65 FE) MG EC tablet Take 1 tablet (325 mg total) by mouth daily.  60 tablet  3  . HYDROcodone-acetaminophen (NORCO/VICODIN) 5-325 MG per tablet Take 1 tablet by mouth 3 (three) times daily as needed.  90 tablet  0  . mirtazapine (REMERON) 45 MG tablet TAKE ONE BY MOUTH AT BEDTIME  90 tablet  3  . Multiple Vitamin (MULTIVITAMIN) tablet Take 1 tablet by mouth daily.      . NON FORMULARY Mega Red once daily.      Marland Kitchen omeprazole (PRILOSEC) 40 MG capsule Take 1 capsule (40 mg total) by mouth daily.  90 capsule  3  . spironolactone (ALDACTONE) 25 MG tablet Take 1 tablet (25 mg total) by mouth daily.  90 tablet  2  . thiamine 100 MG tablet Take 1 tablet (100 mg total) by mouth daily.  30 tablet  0  . VIAGRA 50 MG tablet TAKE ONE BY MOUTH AS DIRECTED  10 tablet   0  . vitamin C (ASCORBIC ACID) 500 MG tablet Take 500 mg by mouth daily.        No Known Allergies  Past Medical History  Diagnosis Date  . Depression     mild at most  . Diverticulosis of colon   . GERD (gastroesophageal reflux disease)   . Hypertension   . Sleep disturbance   . DDD (degenerative disc disease)   . Erectile dysfunction   . GI hemorrhage 2006, 2013    esophagitis and 1+ varices, melena in 2013   . Alcoholism   . Acute posthemorrhagic anemia   . Thrombocytopenia, unspecified   . Personal history of colonic polyps   . 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 Basal cell carcinoma   . Gastric ulcer 08/2011    small, antral ulcer  . Angiodysplasia of colon 09/11/2011    Multiple in the right colon, ablated with APC. Question if this is portal colopathy.   . 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 Campylobacter diarrhea 01/07/2012  . Alcoholic cirrhosis of liver 09/24/2011    Years of alcohol use. Progressed from fatty liver to cirrhosis based upon review imaging. Manifestations are portal hypertension with esophageal varices, portal gastropathy, rectal  varices and he may have portal colopathy. Ascites also present. Naive to HAV and HBV Claims he has had pneumococcal vaccine     Past Surgical History  Procedure Date  . Varicocele repair 1980's  . Knee arthroscopy 2008    Right  . Knee arthroscopy 2008    Left  . Colonoscopy 2001, 200411/02/2006  . Esophagogastroduodenoscopy 2006  . Esophagogastroduodenoscopy 09/11/2011    Procedure: ESOPHAGOGASTRODUODENOSCOPY (EGD);  Surgeon: Iva Boop, MD;  Location: [facility];  Service: Endoscopy;  Laterality: N/A;  . Colonoscopy 09/11/2011    Procedure: COLONOSCOPY;  Surgeon: Iva Boop, MD;  Location: [facility];  Service: Endoscopy;  Laterality: N/A;  . Cataract extraction   . [place] thora/paracentesis 10/28/2011  . Eye surgery   . Flexible sigmoidoscopy 01/07/2012     Procedure: FLEXIBLE SIGMOIDOSCOPY;  Surgeon: Hart Carwin, MD;  Location: [facility];  Service: Endoscopy;  Laterality: N/A;    Family History  Problem Relation Age of Onset  . Stroke Mother   . Diabetes Mother   . Colon cancer Neg Hx     History   Social History  . Marital Status: Married    Spouse Name: N/A    Number of Children: 1  . Years of Education: N/A   Occupational History  . Retired     mostly from [employer]   Social History Main Topics  . Smoking status: Current Everyday Smoker -- 0.5 packs/day    Types: Cigarettes  . Smokeless tobacco: Never Used  . Alcohol Use: No  . Drug Use: No  . Sexually Active: Not Currently   Other Topics Concern  . Not on file   Social History Narrative   Married- 2nd1 son (adopted) from previous marriage   Review of Systems Variable sleep Appetite is great Weight fairly stable     Objective:   Physical Exam  Constitutional: He appears well-developed and well-nourished. No distress.  Neck: Normal range of motion. Neck supple.  Cardiovascular: Normal rate, regular rhythm and normal heart sounds.  Exam reveals no gallop.   No murmur heard. Pulmonary/Chest: Effort normal and breath sounds normal. No respiratory distress. He has no wheezes. He has no rales.  Abdominal: Soft. There is no tenderness.  Musculoskeletal: He exhibits no edema and no tenderness.  Lymphadenopathy:    He has no cervical adenopathy.  Psychiatric: He has a normal mood and affect. His behavior is normal.          Assessment & Plan:

## 2012-02-06 ENCOUNTER — Ambulatory Visit: Payer: 59 | Admitting: Internal Medicine

## 2012-02-10 ENCOUNTER — Ambulatory Visit: Payer: 59 | Admitting: Internal Medicine

## 2012-02-28 ENCOUNTER — Other Ambulatory Visit: Payer: Self-pay | Admitting: *Deleted

## 2012-02-28 MED ORDER — HYDROCODONE-ACETAMINOPHEN 5-325 MG PO TABS: 1.0000 | ORAL_TABLET | Freq: Three times a day (TID) | ORAL | Status: DC | PRN

## 2012-02-28 NOTE — Telephone Encounter (Signed)
Okay #90 x 0 

## 2012-02-28 NOTE — Telephone Encounter (Signed)
Last refilled 01/28/12

## 2012-02-28 NOTE — Telephone Encounter (Signed)
rx called into pharmacy

## 2012-03-14 ENCOUNTER — Other Ambulatory Visit: Payer: Self-pay | Admitting: Internal Medicine

## 2012-03-23 ENCOUNTER — Telehealth: Payer: Self-pay | Admitting: Internal Medicine

## 2012-03-23 NOTE — Telephone Encounter (Signed)
Pt dropped off insurance forms to be filled out. I gave them to [facility].

## 2012-03-24 NOTE — Telephone Encounter (Signed)
Lab appointment scheduled 

## 2012-03-24 NOTE — Telephone Encounter (Signed)
He has physical set up in March If he needs this info before then, please set up blood work for fasting lipid and sugar Can put in height, weight, BP from last visit

## 2012-03-25 ENCOUNTER — Other Ambulatory Visit (INDEPENDENT_AMBULATORY_CARE_PROVIDER_SITE_OTHER): Payer: 59

## 2012-03-25 DIAGNOSIS — I10 Essential (primary) hypertension: Secondary | ICD-10-CM

## 2012-03-25 DIAGNOSIS — R7309 Other abnormal glucose: Secondary | ICD-10-CM

## 2012-03-26 LAB — LIPID PANEL
Total CHOL/HDL Ratio: 3
Triglycerides: 79 mg/dL (ref 0.0–149.0)

## 2012-03-27 ENCOUNTER — Encounter: Payer: Self-pay | Admitting: *Deleted

## 2012-03-27 ENCOUNTER — Other Ambulatory Visit: Payer: Self-pay | Admitting: *Deleted

## 2012-03-27 MED ORDER — HYDROCODONE-ACETAMINOPHEN 5-325 MG PO TABS: 1.0000 | ORAL_TABLET | Freq: Three times a day (TID) | ORAL | Status: DC | PRN

## 2012-03-27 NOTE — Telephone Encounter (Signed)
Okay #90 x 0 

## 2012-03-27 NOTE — Telephone Encounter (Signed)
Medication phoned to pharmacy.  

## 2012-04-27 ENCOUNTER — Other Ambulatory Visit: Payer: Self-pay

## 2012-04-27 MED ORDER — HYDROCODONE-ACETAMINOPHEN 5-325 MG PO TABS: 1.0000 | ORAL_TABLET | Freq: Three times a day (TID) | ORAL | Status: DC | PRN

## 2012-04-27 NOTE — Telephone Encounter (Signed)
Pt request refill hydrocodone apap to [facility].Please advise.

## 2012-04-27 NOTE — Telephone Encounter (Signed)
Will refil 1 mo in PCP's absence Px written for call in

## 2012-04-28 NOTE — Telephone Encounter (Signed)
Called in Rx as prescribed 

## 2012-05-05 ENCOUNTER — Encounter: Payer: Self-pay | Admitting: Internal Medicine

## 2012-05-05 ENCOUNTER — Ambulatory Visit (INDEPENDENT_AMBULATORY_CARE_PROVIDER_SITE_OTHER): Payer: 59 | Admitting: Internal Medicine

## 2012-05-05 VITALS — BP 128/70 | HR 65 | Temp 97.8°F | Wt 192.0 lb

## 2012-05-05 DIAGNOSIS — M2612 Other jaw asymmetry: Secondary | ICD-10-CM

## 2012-05-05 DIAGNOSIS — K7030 Alcoholic cirrhosis of liver without ascites: Secondary | ICD-10-CM

## 2012-05-05 DIAGNOSIS — Z23 Encounter for immunization: Secondary | ICD-10-CM

## 2012-05-05 LAB — CBC WITH DIFFERENTIAL/PLATELET
Basophils Absolute: 0 10*3/uL (ref 0.0–0.1)
HCT: 42 % (ref 39.0–52.0)
Lymphs Abs: 1 10*3/uL (ref 0.7–4.0)
MCHC: 33.1 g/dL (ref 30.0–36.0)
MCV: 95.7 fl (ref 78.0–100.0)
Monocytes Absolute: 0.4 10*3/uL (ref 0.1–1.0)
Platelets: 124 10*3/uL — ABNORMAL LOW (ref 150.0–400.0)
RDW: 13.8 % (ref 11.5–14.6)

## 2012-05-05 LAB — BASIC METABOLIC PANEL
Calcium: 9 mg/dL (ref 8.4–10.5)
GFR: 124.73 mL/min (ref >60.00)
Sodium: 137 mEq/L (ref 135–145)

## 2012-05-05 LAB — HEPATIC FUNCTION PANEL
AST: 31 U/L (ref 0–37)
Albumin: 3.5 g/dL (ref 3.5–5.2)
Alkaline Phosphatase: 76 U/L (ref 39–117)
Total Bilirubin: 0.7 mg/dL (ref 0.3–1.2)

## 2012-05-05 NOTE — Progress Notes (Signed)
Subjective:    Patient ID: Billy [name], male    DOB: [birth year], 66 y.o.   MRN: [id number]  HPI Discussed cigarettes Has cut down to 4 per day Planning quit day  Went to x-ray Told he had dense mandible Concerned about Paget's disease based on the film Reviewed the film and I couldn't tell anything  Feels good Has gained 13# Walking at 16 minute mile mile pace for 3 miles Eating well  Very sensitive breasts  Current Outpatient Prescriptions on File Prior to Visit  Medication Sig Dispense Refill  . atenolol (TENORMIN) 25 MG tablet Take 1 tablet (25 mg total) by mouth daily.  90 tablet  3  . Cholecalciferol (VITAMIN D3) 1000 UNITS CAPS Take 1 capsule by mouth daily.      . ferrous sulfate 325 (65 FE) MG EC tablet Take 1 tablet (325 mg total) by mouth daily.  60 tablet  3  . HYDROcodone-acetaminophen (NORCO/VICODIN) 5-325 MG per tablet Take 1 tablet by mouth 3 (three) times daily as needed.  90 tablet  0  . mirtazapine (REMERON) 45 MG tablet TAKE ONE BY MOUTH AT BEDTIME  90 tablet  3  . Multiple Vitamin (MULTIVITAMIN) tablet Take 1 tablet by mouth daily.      Marland Kitchen omeprazole (PRILOSEC) 40 MG capsule Take 1 capsule (40 mg total) by mouth daily.  90 capsule  3  . spironolactone (ALDACTONE) 25 MG tablet Take 1 tablet (25 mg total) by mouth daily.  90 tablet  2  . VIAGRA 50 MG tablet TAKE ONE BY MOUTH AS DIRECTED  10 tablet  0  . vitamin C (ASCORBIC ACID) 500 MG tablet Take 500 mg by mouth daily.      Marland Kitchen DISCONTD: spironolactone (ALDACTONE) 25 MG tablet TAKE 1 TABLET (25 MG TOTAL) BY MOUTH DAILY.  30 tablet  5    No Known Allergies  Past Medical History  Diagnosis Date  . Depression     mild at most  . Diverticulosis of colon   . GERD (gastroesophageal reflux disease)   . Hypertension   . Sleep disturbance   . DDD (degenerative disc disease)   . Erectile dysfunction   . GI hemorrhage 2006, 2013    esophagitis and 1+ varices, melena in 2013   . Alcoholism   . Acute  posthemorrhagic anemia   . Thrombocytopenia, unspecified   . Personal history of colonic polyps   . 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 Basal cell carcinoma   . Gastric ulcer 08/2011    small, antral ulcer  . Angiodysplasia of colon 09/11/2011    Multiple in the right colon, ablated with APC. Question if this is portal colopathy.   . 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 Campylobacter diarrhea 01/07/2012  . Alcoholic cirrhosis of liver 09/24/2011    Years of alcohol use. Progressed from fatty liver to cirrhosis based upon review imaging. Manifestations are portal hypertension with esophageal varices, portal gastropathy, rectal varices and he may have portal colopathy. Ascites also present. Naive to HAV and HBV Claims he has had pneumococcal vaccine     Past Surgical History  Procedure Date  . Varicocele repair 1980's  . Knee arthroscopy 2008    Right  . Knee arthroscopy 2008    Left  . Colonoscopy 2001, 200411/02/2006  . Esophagogastroduodenoscopy 2006  . Esophagogastroduodenoscopy 09/11/2011    Procedure: ESOPHAGOGASTRODUODENOSCOPY (EGD);  Surgeon: Iva Boop, MD;  Location: [facility];  Service: Endoscopy;  Laterality: N/A;  . Colonoscopy 09/11/2011    Procedure: COLONOSCOPY;  Surgeon: Iva Boop, MD;  Location: [facility];  Service: Endoscopy;  Laterality: N/A;  . Cataract extraction 2013    bilateral with IOL  . [place] thora/paracentesis 10/28/2011  . Eye surgery   . Flexible sigmoidoscopy 01/07/2012    Procedure: FLEXIBLE SIGMOIDOSCOPY;  Surgeon: Hart Carwin, MD;  Location: [facility];  Service: Endoscopy;  Laterality: N/A;    Family History  Problem Relation Age of Onset  . Stroke Mother   . Diabetes Mother   . Colon cancer Neg Hx     History   Social History  . Marital Status: Married    Spouse Name: N/A    Number of Children: 1  . Years of Education: N/A   Occupational History  . Retired     mostly  from [employer]   Social History Main Topics  . Smoking status: Current Every Day Smoker -- 0.5 packs/day    Types: Cigarettes  . Smokeless tobacco: Never Used  . Alcohol Use: No  . Drug Use: No  . Sexually Active: Not Currently   Other Topics Concern  . Not on file   Social History Narrative   Married- 2nd1 son (adopted) from previous marriage   Review of Systems No abdominal swelling Bowels normal No desire for alcohol at all--doesn't miss it    Objective:   Physical Exam  Constitutional: He appears well-developed and well-nourished. No distress.  Abdominal: Soft. There is no tenderness.       No apparent ascites          Assessment & Plan:

## 2012-05-05 NOTE — Assessment & Plan Note (Signed)
Improved Will stop the spironolactone as no edema He will call if worsening fluid

## 2012-05-05 NOTE — Assessment & Plan Note (Signed)
Not sure about dental x-ray No Paget's seen on spine with CT scan Will just check alk phos

## 2012-05-05 NOTE — Patient Instructions (Signed)
Stop the iron if your blood count is normal. Cut the mirtazapine in half and only take half at bedtime. Stop the spironolactone and call if your weight jumps up by more than 3-5#

## 2012-05-28 ENCOUNTER — Other Ambulatory Visit: Payer: Self-pay | Admitting: Internal Medicine

## 2012-05-28 NOTE — Telephone Encounter (Signed)
Okay #90 x 0 

## 2012-05-28 NOTE — Telephone Encounter (Signed)
rx called into pharmacy

## 2012-06-26 ENCOUNTER — Other Ambulatory Visit: Payer: Self-pay | Admitting: Internal Medicine

## 2012-06-26 NOTE — Telephone Encounter (Signed)
Okay #90 x 0 

## 2012-06-26 NOTE — Telephone Encounter (Signed)
rx called into pharmacy

## 2012-07-27 ENCOUNTER — Other Ambulatory Visit: Payer: Self-pay | Admitting: Internal Medicine

## 2012-07-27 NOTE — Telephone Encounter (Signed)
rx called into pharmacy

## 2012-07-27 NOTE — Telephone Encounter (Signed)
Okay #90 x 0 

## 2012-08-21 ENCOUNTER — Encounter: Payer: 59 | Admitting: Internal Medicine

## 2012-08-26 ENCOUNTER — Other Ambulatory Visit: Payer: Self-pay | Admitting: Internal Medicine

## 2012-08-26 NOTE — Telephone Encounter (Signed)
rx called into pharmacy

## 2012-08-26 NOTE — Telephone Encounter (Signed)
Okay #90 x 0 

## 2012-08-31 ENCOUNTER — Other Ambulatory Visit: Payer: Self-pay | Admitting: Internal Medicine

## 2012-09-02 ENCOUNTER — Other Ambulatory Visit: Payer: Self-pay | Admitting: Internal Medicine

## 2012-09-16 ENCOUNTER — Encounter: Payer: Self-pay | Admitting: Internal Medicine

## 2012-09-16 ENCOUNTER — Ambulatory Visit (INDEPENDENT_AMBULATORY_CARE_PROVIDER_SITE_OTHER): Payer: 59 | Admitting: Internal Medicine

## 2012-09-16 VITALS — BP 130/80 | HR 66 | Temp 97.9°F | Wt 207.0 lb

## 2012-09-16 DIAGNOSIS — M51379 Other intervertebral disc degeneration, lumbosacral region without mention of lumbar back pain or lower extremity pain: Secondary | ICD-10-CM

## 2012-09-16 DIAGNOSIS — F3289 Other specified depressive episodes: Secondary | ICD-10-CM

## 2012-09-16 DIAGNOSIS — K7030 Alcoholic cirrhosis of liver without ascites: Secondary | ICD-10-CM

## 2012-09-16 DIAGNOSIS — Z0000 Encounter for general adult medical examination without abnormal findings: Secondary | ICD-10-CM

## 2012-09-16 DIAGNOSIS — I10 Essential (primary) hypertension: Secondary | ICD-10-CM

## 2012-09-16 DIAGNOSIS — B070 Plantar wart: Secondary | ICD-10-CM

## 2012-09-16 NOTE — Progress Notes (Signed)
Subjective:    Patient ID: Billy [name], male    DOB: 08-28-1945, 67 y.o.   MRN: [id number]  HPI Here for physical  Doing well in general Thinks he has a plantar wart--affects his work outs Limited by weather---walks 3 miles when he can  Still abstinent from alcohol Has not been tempted because he knows his health is critical  Current Outpatient Prescriptions on File Prior to Visit  Medication Sig Dispense Refill  . atenolol (TENORMIN) 25 MG tablet TAKE 1 TABLET (25 MG TOTAL) BY MOUTH DAILY.  90 tablet  3  . Cholecalciferol (VITAMIN D3) 1000 UNITS CAPS Take 1 capsule by mouth daily.      . ferrous sulfate 325 (65 FE) MG EC tablet Take 1 tablet (325 mg total) by mouth daily.  60 tablet  3  . HYDROcodone-acetaminophen (NORCO/VICODIN) 5-325 MG per tablet TAKE 1 TABLET BY MOUTH 3 TIMES A DAY AS NEEDED  90 tablet  0  . mirtazapine (REMERON) 45 MG tablet Take 22.5 mg by mouth at bedtime.       Marland Kitchen omeprazole (PRILOSEC) 40 MG capsule TAKE 1 CAPSULE (40 MG TOTAL) BY MOUTH DAILY.  90 capsule  2  . VIAGRA 50 MG tablet TAKE ONE BY MOUTH AS DIRECTED  10 tablet  0   No current facility-administered medications on file prior to visit.    No Known Allergies  Past Medical History  Diagnosis Date  . Depression     mild at most  . Diverticulosis of colon   . GERD (gastroesophageal reflux disease)   . Hypertension   . Sleep disturbance   . DDD (degenerative disc disease)   . Erectile dysfunction   . GI hemorrhage 2006, 2013    esophagitis and 1+ varices, melena in 2013   . Alcoholism   . Acute posthemorrhagic anemia   . Thrombocytopenia, unspecified   . Personal history of colonic polyps   . 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 Basal cell carcinoma   . Gastric ulcer 08/2011    small, antral ulcer  . Angiodysplasia of colon 09/11/2011    Multiple in the right colon, ablated with APC. Question if this is portal colopathy.    . 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 Campylobacter diarrhea 01/07/2012  . Alcoholic cirrhosis of liver 09/24/2011    Years of alcohol use. Progressed from fatty liver to cirrhosis based upon review imaging. Manifestations are portal hypertension with esophageal varices, portal gastropathy, rectal varices and he may have portal colopathy. Ascites also present. Naive to HAV and HBV Claims he has had pneumococcal vaccine     Past Surgical History  Procedure Laterality Date  . Varicocele repair  1980's  . Knee arthroscopy  2008    Right  . Knee arthroscopy  2008    Left  . Colonoscopy  2001, 200411/02/2006  . Esophagogastroduodenoscopy  2006  . Esophagogastroduodenoscopy  09/11/2011    Procedure: ESOPHAGOGASTRODUODENOSCOPY (EGD);  Surgeon: Iva Boop, MD;  Location: [facility];  Service: Endoscopy;  Laterality: N/A;  . Colonoscopy  09/11/2011    Procedure: COLONOSCOPY;  Surgeon: Iva Boop, MD;  Location: [facility];  Service: Endoscopy;  Laterality: N/A;  . Cataract extraction  2013    bilateral with IOL  . [place] thora/paracentesis  10/28/2011  . Eye surgery    . Flexible sigmoidoscopy  01/07/2012    Procedure: FLEXIBLE SIGMOIDOSCOPY;  Surgeon: Hart Carwin, MD;  Location: [facility];  Service: Endoscopy;  Laterality: N/A;    Family History  Problem Relation Age of Onset  . Stroke Mother   . Diabetes Mother   . Colon cancer Neg Hx     History   Social History  . Marital Status: Married    Spouse Name: N/A    Number of Children: 1  . Years of Education: N/A   Occupational History  . Retired     mostly from [employer]   Social History Main Topics  . Smoking status: Current Every Day Smoker -- 0.50 packs/day    Types: Cigarettes  . Smokeless tobacco: Never Used  . Alcohol Use: No  . Drug Use: No  . Sexually Active: Not Currently   Other Topics Concern  . Not on file   Social History Narrative   Married- 2nd   1 son (adopted) from previous marriage         Review  of Systems  Constitutional: Negative for fatigue and unexpected weight change.       Wears seat belt  HENT: Positive for tinnitus. Negative for hearing loss, congestion, rhinorrhea and dental problem.        Keeps up with dentist  Eyes: Negative for visual disturbance.       No diplopia or unilateral vision loss  Respiratory: Negative for cough, chest tightness and shortness of breath.   Cardiovascular: Negative for chest pain, palpitations and leg swelling.  Gastrointestinal: Negative for nausea, vomiting, abdominal pain, constipation and blood in stool.       No heartburn  Endocrine: Negative for cold intolerance and heat intolerance.  Genitourinary: Positive for frequency. Negative for urgency and difficulty urinating.       Slight increased frequency --relates to diuretic No sexual problems  Musculoskeletal: Positive for back pain. Negative for joint swelling and arthralgias.       Still needs the hydrocodone tid regularly  Skin: Negative for rash.       No suspicious areas  Allergic/Immunologic: Negative for environmental allergies and immunocompromised state.  Neurological: Negative for dizziness, syncope, weakness, light-headedness, numbness and headaches.  Hematological: Negative for adenopathy. Does not bruise/bleed easily.  Psychiatric/Behavioral: Positive for sleep disturbance. Negative for dysphoric mood. The patient is not nervous/anxious.        Occ sleep problems--will have 2-3 bad nights in a row then fine       Objective:   Physical Exam  Constitutional: He is oriented to person, place, and time. He appears well-developed and well-nourished. No distress.  HENT:  Head: Normocephalic and atraumatic.  Right Ear: External ear normal.  Left Ear: External ear normal.  Mouth/Throat: Oropharynx is clear and moist. No oropharyngeal exudate.  Eyes: Conjunctivae and EOM are normal. Pupils are equal, round, and reactive to light.  Neck: Normal range of motion. Neck supple. No  thyromegaly present.  Cardiovascular: Normal rate, regular rhythm, normal heart sounds and intact distal pulses.  Exam reveals no gallop.   No murmur heard. Pulmonary/Chest: Effort normal and breath sounds normal. No respiratory distress. He has no wheezes. He has no rales.  Abdominal: Soft. There is no tenderness.  Musculoskeletal: He exhibits no edema and no tenderness.  Lymphadenopathy:    He has no cervical adenopathy.  Neurological: He is alert and oriented to person, place, and time.  Skin: No rash noted. No erythema.  Plantar wart at base of right 5th metatarsal  Psychiatric: He has a normal mood and affect. His behavior is normal.  Assessment & Plan:

## 2012-09-16 NOTE — Assessment & Plan Note (Signed)
Liquid nitrogen 1 minute x 2 Tolerated well Discussed home care

## 2012-09-16 NOTE — Assessment & Plan Note (Signed)
Continues to be abstinent Keeps up with Dr Leone Payor

## 2012-09-16 NOTE — Assessment & Plan Note (Signed)
Doing well Discussed wintertime exercise Discussed PSA--will not check UTD otherwise

## 2012-09-16 NOTE — Assessment & Plan Note (Signed)
Does well with mirtazapine Uses 1/2 tab

## 2012-09-16 NOTE — Assessment & Plan Note (Signed)
BP Readings from Last 3 Encounters:  09/16/12 130/80  05/05/12 128/70  01/29/12 122/70   Good control Lab Results  Component Value Date   CREATININE 0.7 05/05/2012

## 2012-09-16 NOTE — Assessment & Plan Note (Signed)
With chronic pain Uses the hydrocodone regularly

## 2012-09-23 ENCOUNTER — Telehealth: Payer: Self-pay

## 2012-09-23 DIAGNOSIS — Z23 Encounter for immunization: Secondary | ICD-10-CM

## 2012-09-23 NOTE — Telephone Encounter (Signed)
Message copied by [place], PATTI E on Wed Sep 23, 2012 10:31 AM ------      Message from: Richardson Chiquito      Created: Mon Nov 11, 2011 11:06 AM      Regarding: FW: Twinrex booster 1 year                    ----- Message -----         From: Derry Skill, CMA         Sent: 11/11/2011  10:15 AM           To: Patti E [place], CMA      Subject: Twinrex booster 1 year                                   Pt had 3rd Twin rex injection on 11-11-2011 and due for year booster 10/2012.  ------

## 2012-09-23 NOTE — Telephone Encounter (Signed)
Spoke to patient , he has appointment to come see Dr. Shelle Iron for yearly check and to get Twinrix 12 month booster vaccine.  Standing order put in for manual release of this booster vaccine.

## 2012-09-24 ENCOUNTER — Other Ambulatory Visit: Payer: Self-pay | Admitting: *Deleted

## 2012-09-24 MED ORDER — HYDROCODONE-ACETAMINOPHEN 5-325 MG PO TABS: ORAL_TABLET | ORAL | Status: DC

## 2012-09-24 NOTE — Telephone Encounter (Signed)
rx called to pharmacy 

## 2012-09-24 NOTE — Telephone Encounter (Signed)
[place] pt, Last filled 08/26/2012

## 2012-10-07 ENCOUNTER — Ambulatory Visit: Payer: 59 | Admitting: Family Medicine

## 2012-10-16 ENCOUNTER — Encounter: Payer: Self-pay | Admitting: Internal Medicine

## 2012-10-16 ENCOUNTER — Other Ambulatory Visit (INDEPENDENT_AMBULATORY_CARE_PROVIDER_SITE_OTHER): Payer: 59

## 2012-10-16 ENCOUNTER — Ambulatory Visit (INDEPENDENT_AMBULATORY_CARE_PROVIDER_SITE_OTHER): Payer: 59 | Admitting: Internal Medicine

## 2012-10-16 VITALS — BP 104/60 | HR 68 | Ht 75.0 in | Wt 205.2 lb

## 2012-10-16 DIAGNOSIS — R1013 Epigastric pain: Secondary | ICD-10-CM

## 2012-10-16 DIAGNOSIS — Z23 Encounter for immunization: Secondary | ICD-10-CM

## 2012-10-16 DIAGNOSIS — K7030 Alcoholic cirrhosis of liver without ascites: Secondary | ICD-10-CM

## 2012-10-16 DIAGNOSIS — R188 Other ascites: Secondary | ICD-10-CM

## 2012-10-16 DIAGNOSIS — D500 Iron deficiency anemia secondary to blood loss (chronic): Secondary | ICD-10-CM

## 2012-10-16 LAB — CBC WITH DIFFERENTIAL/PLATELET
Basophils Absolute: 0 10*3/uL (ref 0.0–0.1)
Basophils Relative: 0.7 % (ref 0.0–3.0)
Hemoglobin: 14.5 g/dL (ref 13.0–17.0)
Lymphocytes Relative: 22.1 % (ref 12.0–46.0)
Monocytes Relative: 7.3 % (ref 3.0–12.0)
Neutro Abs: 3.6 10*3/uL (ref 1.4–7.7)
Neutrophils Relative %: 68.1 % (ref 43.0–77.0)
RBC: 4.58 Mil/uL (ref 4.22–5.81)
RDW: 15 % — ABNORMAL HIGH (ref 11.5–14.6)

## 2012-10-16 LAB — COMPREHENSIVE METABOLIC PANEL
ALT: 25 U/L (ref 0–53)
Albumin: 4 g/dL (ref 3.5–5.2)
CO2: 32 mEq/L (ref 19–32)
Calcium: 9.4 mg/dL (ref 8.4–10.5)
Chloride: 100 mEq/L (ref 96–112)
Creatinine, Ser: 0.7 mg/dL (ref 0.4–1.5)
GFR: 112.97 mL/min (ref >60.00)
Potassium: 4.2 mEq/L (ref 3.5–5.1)

## 2012-10-16 LAB — PROTIME-INR
INR: 1.1 ratio — ABNORMAL HIGH (ref 0.8–1.0)
Prothrombin Time: 11.6 s (ref 10.2–12.4)

## 2012-10-16 LAB — AFP TUMOR MARKER: AFP-Tumor Marker: 1.6 ng/mL (ref 0.0–8.0)

## 2012-10-16 NOTE — Progress Notes (Signed)
  Subjective:    Patient ID: Billy [name], male    DOB: [birth year], 67 y.o.   MRN: [id number]  HPI The patient presents for follow-up and evaluation of new epigastric pain. He has had a few episodes of post prandial epigastric pain without associated symptoms. He has otherwise been ok with continued abstinence from alcohol.  Medications, allergies, past medical history, past surgical history, family history and social history are reviewed and updated in the EMR.  Review of Systems As above    Objective:   Physical Exam General:  NAD Eyes:   anicteric Lungs:  clear Heart:  S1S2 no rubs, murmurs or gallops Abdomen:  soft and nontender, BS+, somewhat protuberant - no obvious ascites and no HSM Ext:   no edema    Assessment & Plan:  Abdominal pain, epigastric  Alcoholic cirrhosis of liver  Iron deficiency anemia secondary to blood loss (chronic) - colonic angiodysplasia vs. Portal colopathy  Ascites at least in past  1. CBC, CMET, INR, AFP, amylase, lipase 2. CT abd/pelvis w/ contrast 3. Further plans pending that - think needs non-selective beta blocker, he also asked about stopping spironolactone 4. May need a follow-up EGD also

## 2012-10-16 NOTE — Patient Instructions (Addendum)
Your physician has requested that you go to the basement for lab work before leaving today.  You have been scheduled for a CT scan of the abdomen and pelvis at [facility] ([address]---this is in the same building as [facility]).   You are scheduled on 10/21/12  At 1pm. You should arrive 15 minutes prior to your appointment time for registration. Please follow the written instructions below on the day of your exam:  WARNING: IF YOU ARE ALLERGIC TO IODINE/X-RAY DYE, PLEASE NOTIFY RADIOLOGY IMMEDIATELY AT [phone]! YOU WILL BE GIVEN A 13 HOUR PREMEDICATION PREP.  1) Do not eat or drink anything after  (4 hours prior to your test) 2) You have been given 2 bottles of oral contrast to drink. The solution may taste  better if refrigerated, but do NOT add ice or any other liquid to this solution. Shake  well before drinking.    Drink 1 bottle of contrast @ 11am  (2 hours prior to your exam)  Drink 1 bottle of contrast @ 12 noon (1 hour prior to your exam)  You may take any medications as prescribed with a small amount of water except for the following: Metformin, Glucophage, Glucovance, Avandamet, Riomet, Fortamet, Actoplus Met, Janumet, Glumetza or Metaglip. The above medications must be held the day of the exam AND 48 hours after the exam.  The purpose of you drinking the oral contrast is to aid in the visualization of your intestinal tract. The contrast solution may cause some diarrhea. Before your exam is started, you will be given a small amount of fluid to drink. Depending on your individual set of symptoms, you may also receive an intravenous injection of x-ray contrast/dye. Plan on being at [facility] for 30 minutes or long, depending on the type of exam you are having performed.  This test typically takes 30-45 minutes to complete.   If you have any questions regarding your exam or if you need to reschedule, you may call the CT department at [phone]  between the hours of 8:00 am and 5:00 pm, Monday-Friday.  ________________________________________________________________________  Today you were given your Twinrix booster vaccine and information on this.  Thank you for choosing me and [facility].  Iva Boop, M.D., [facility]

## 2012-10-21 ENCOUNTER — Inpatient Hospital Stay: Admission: RE | Admit: 2012-10-21 | Payer: 59 | Source: Ambulatory Visit

## 2012-10-23 ENCOUNTER — Other Ambulatory Visit: Payer: Self-pay | Admitting: Internal Medicine

## 2012-10-23 NOTE — Telephone Encounter (Signed)
rx called into pharmacy

## 2012-10-23 NOTE — Telephone Encounter (Signed)
Okay #90 x 0 

## 2012-10-26 ENCOUNTER — Ambulatory Visit (INDEPENDENT_AMBULATORY_CARE_PROVIDER_SITE_OTHER)
Admission: RE | Admit: 2012-10-26 | Discharge: 2012-10-26 | Disposition: A | Discharge status: Home / Self Care | Payer: 59 | Source: Ambulatory Visit | Attending: Internal Medicine | Admitting: Internal Medicine

## 2012-10-26 DIAGNOSIS — K7030 Alcoholic cirrhosis of liver without ascites: Secondary | ICD-10-CM

## 2012-10-26 DIAGNOSIS — R1013 Epigastric pain: Secondary | ICD-10-CM

## 2012-10-26 DIAGNOSIS — R188 Other ascites: Secondary | ICD-10-CM

## 2012-10-26 MED ORDER — IOHEXOL 300 MG/ML  SOLN
100.0000 mL | Freq: Once | INTRAMUSCULAR | Status: AC | PRN
  Administered 2012-10-26: 100 mL via INTRAVENOUS

## 2012-10-27 ENCOUNTER — Other Ambulatory Visit: Payer: Self-pay

## 2012-10-27 DIAGNOSIS — R1011 Right upper quadrant pain: Secondary | ICD-10-CM

## 2012-10-27 DIAGNOSIS — K8020 Calculus of gallbladder without cholecystitis without obstruction: Secondary | ICD-10-CM

## 2012-10-27 NOTE — Progress Notes (Signed)
Quick Note:  Main finidngs are cirrhosis (we knew) and possible gallstones. Gallstones could have caused his pain - needs an ultrasound limited RUQ study to evaluate for gallstones and then we can consider possible surgical referral for removal of gallbladder - will explain more after [place]   ______

## 2012-10-30 ENCOUNTER — Other Ambulatory Visit: Payer: Self-pay | Admitting: Internal Medicine

## 2012-10-30 ENCOUNTER — Ambulatory Visit (HOSPITAL_COMMUNITY)
Admission: RE | Admit: 2012-10-30 | Discharge: 2012-10-30 | Disposition: A | Discharge status: Home / Self Care | Payer: 59 | Source: Ambulatory Visit | Attending: Internal Medicine | Admitting: Internal Medicine

## 2012-10-30 DIAGNOSIS — R1011 Right upper quadrant pain: Secondary | ICD-10-CM

## 2012-10-30 DIAGNOSIS — R188 Other ascites: Secondary | ICD-10-CM | POA: Insufficient documentation

## 2012-10-30 DIAGNOSIS — K838 Other specified diseases of biliary tract: Secondary | ICD-10-CM | POA: Insufficient documentation

## 2012-10-30 DIAGNOSIS — K8020 Calculus of gallbladder without cholecystitis without obstruction: Secondary | ICD-10-CM

## 2012-10-30 DIAGNOSIS — K7460 Unspecified cirrhosis of liver: Secondary | ICD-10-CM | POA: Insufficient documentation

## 2012-10-30 DIAGNOSIS — R161 Splenomegaly, not elsewhere classified: Secondary | ICD-10-CM | POA: Insufficient documentation

## 2012-10-30 NOTE — Progress Notes (Signed)
Quick Note:  [place] confirms gallstones which seem like a likely cause of his recent pain Cholecystectomy an option  Two options now  1) follow-up me to discuss next available 2) schedule [facility] appointment - ask if he has surgical MD request 3) also - persistent pain and sxs like fever, intractable vomiting go to ED 4) no change in fluid pill for time being until follow-up  ______

## 2012-11-09 ENCOUNTER — Telehealth: Payer: Self-pay | Admitting: Internal Medicine

## 2012-11-09 DIAGNOSIS — K8020 Calculus of gallbladder without cholecystitis without obstruction: Secondary | ICD-10-CM

## 2012-11-09 NOTE — Telephone Encounter (Signed)
Patient is scheduled with [facility] for 11/18/12 11:30 with Dr. Janee Morn I have left a message for the patient to call back to discuss

## 2012-11-09 NOTE — Telephone Encounter (Signed)
I have left a message for [place] at [facility] to schedule the appt and call me back.

## 2012-11-10 NOTE — Telephone Encounter (Signed)
Patient notified of appt with [facility] for 11/18/12.  He will keep the follow up appt with Dr. Leone Payor for 5/20.  He notes that he has been having trouble with leg cramps from the diuretics.  He states he has added an OTC potassium supplement and increased his water intake and this has resolved.  He denies any weight gain or fluid retention, edema.  Should he be on a fluid restriction?  Does he need labs?

## 2012-11-11 NOTE — Telephone Encounter (Signed)
He can hold the spironolactone and K and stay on a low salt diet  Need a weight now and every week x 4 - can do at home  See me in 6 weeks

## 2012-11-11 NOTE — Telephone Encounter (Signed)
Patient advised.  He will call if his weight increases after stopping diuretics.  He already has follow up scheduled

## 2012-11-18 ENCOUNTER — Ambulatory Visit (INDEPENDENT_AMBULATORY_CARE_PROVIDER_SITE_OTHER): Payer: 59 | Admitting: General Surgery

## 2012-11-18 ENCOUNTER — Encounter (INDEPENDENT_AMBULATORY_CARE_PROVIDER_SITE_OTHER): Payer: Self-pay | Admitting: General Surgery

## 2012-11-18 VITALS — BP 146/74 | HR 60 | Temp 97.6°F | Resp 16 | Ht 75.0 in | Wt 205.0 lb

## 2012-11-18 DIAGNOSIS — K7030 Alcoholic cirrhosis of liver without ascites: Secondary | ICD-10-CM

## 2012-11-18 DIAGNOSIS — K8020 Calculus of gallbladder without cholecystitis without obstruction: Secondary | ICD-10-CM

## 2012-11-18 NOTE — Progress Notes (Signed)
Patient ID: Billy [name], male   DOB: Aug 31, 1945, 67 y.o.   MRN: [id number]  Chief Complaint  Patient presents with  . New Evaluation    eval gallsones    HPI Billy [name] is a 67 y.o. male.  Chief complaint: Right abdominal pain HPI Patient is followed closely by Dr. Stan Head at [facility] gastro[facility]ology regarding alcoholic cirrhosis. He has had this diagnosis for one year. He has not had any alcohol since the diagnosis. He had 3 episodes of acute right upper quadrant abdominal pain, last being over a month ago. He noticed them occur after breakfast each time and last approximately 5 hours with spontaneous resolution. He was worked up further by Dr. Leone Payor including CT scan of the abdomen and pelvis demonstrating evidence of cirrhosis with sequelae of portal hypertension. He also had a questionable gallstone. Further abdominal ultrasound did reveal gallstones but no evidence of cholecystitis. Paracentesis was scheduled but he did not have significant ascites so that was not done. He has been walking 3 miles a day and eating a healthy diet and has not had another gallbladder attack for over a month. Past Medical History  Diagnosis Date  . Depression     mild at most  . Diverticulosis of colon   . GERD (gastroesophageal reflux disease)   . Hypertension   . Sleep disturbance   . DDD (degenerative disc disease)   . Erectile dysfunction   . GI hemorrhage 2006, 2013    esophagitis and 1+ varices, melena in 2013   . Alcoholism   . Acute posthemorrhagic anemia   . Thrombocytopenia, unspecified   . Personal history of colonic polyps   . 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 Basal cell carcinoma   . Gastric ulcer 08/2011    small, antral ulcer  . Angiodysplasia of colon 09/11/2011    Multiple in the right colon, ablated with APC. Question if this is portal colopathy.   . 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 Campylobacter diarrhea  01/07/2012  . Alcoholic cirrhosis of liver 09/24/2011    Years of alcohol use. Progressed from fatty liver to cirrhosis based upon review imaging. Manifestations are portal hypertension with esophageal varices, portal gastropathy, rectal varices and he may have portal colopathy. Ascites also present. Naive to HAV and HBV Claims he has had pneumococcal vaccine     Past Surgical History  Procedure Laterality Date  . Varicocele repair  1980's  . Knee arthroscopy  2008    Right  . Knee arthroscopy  2008    Left  . Colonoscopy  2001, 200411/02/2006  . Esophagogastroduodenoscopy  2006  . Esophagogastroduodenoscopy  09/11/2011    Procedure: ESOPHAGOGASTRODUODENOSCOPY (EGD);  Surgeon: Iva Boop, MD;  Location: [facility];  Service: Endoscopy;  Laterality: N/A;  . Colonoscopy  09/11/2011    Procedure: COLONOSCOPY;  Surgeon: Iva Boop, MD;  Location: [facility];  Service: Endoscopy;  Laterality: N/A;  . Cataract extraction  2013    bilateral with IOL  . [place] thora/paracentesis  10/28/2011  . Eye surgery    . Flexible sigmoidoscopy  01/07/2012    Procedure: FLEXIBLE SIGMOIDOSCOPY;  Surgeon: Hart Carwin, MD;  Location: [facility];  Service: Endoscopy;  Laterality: N/A;    Family History  Problem Relation Age of Onset  . Stroke Mother   . Diabetes Mother   . Colon cancer Neg Hx     Social History History  Substance Use  Topics  . Smoking status: Former Smoker -- 0.50 packs/day    Types: Cigarettes    Quit date: 08/15/2012  . Smokeless tobacco: Never Used  . Alcohol Use: No    No Known Allergies  Current Outpatient Prescriptions  Medication Sig Dispense Refill  . atenolol (TENORMIN) 25 MG tablet TAKE 1 TABLET (25 MG TOTAL) BY MOUTH DAILY.  90 tablet  3  . Cholecalciferol (VITAMIN D3) 1000 UNITS CAPS Take 1 capsule by mouth daily.      . ferrous sulfate 325 (65 FE) MG EC tablet Take 1 tablet (325 mg total) by mouth daily.  60 tablet  3  . mirtazapine (REMERON) 45 MG tablet  Take 22.5 mg by mouth at bedtime.       Marland Kitchen omeprazole (PRILOSEC) 40 MG capsule TAKE 1 CAPSULE (40 MG TOTAL) BY MOUTH DAILY.  90 capsule  2  . VIAGRA 50 MG tablet TAKE ONE BY MOUTH AS DIRECTED  10 tablet  0  . HYDROcodone-acetaminophen (NORCO/VICODIN) 5-325 MG per tablet TAKE 1 TABLET BY MOUTH 3 TIMES A DAY AS NEEDED  90 tablet  0  . spironolactone (ALDACTONE) 25 MG tablet Take 25 mg by mouth daily.       No current facility-administered medications for this visit.    Review of Systems Review of Systems  Constitutional: Negative for fever, chills and unexpected weight change.  HENT: Negative for hearing loss, congestion, sore throat, trouble swallowing and voice change.   Eyes: Negative for visual disturbance.  Respiratory: Negative for cough and wheezing.   Cardiovascular: Negative for chest pain, palpitations and leg swelling.  Gastrointestinal:       See history of present illness  Genitourinary: Negative for hematuria and difficulty urinating.  Musculoskeletal: Negative for arthralgias.  Skin: Negative for rash and wound.  Neurological: Negative for seizures, syncope, weakness and headaches.  Hematological: Negative for adenopathy. Does not bruise/bleed easily.  Psychiatric/Behavioral: Negative for confusion.    Blood pressure 146/74, pulse 60, temperature 97.6 F (36.4 C), temperature source Oral, resp. rate 16, height 6\' 3"  (1.905 m), weight 205 lb (92.987 kg).  Physical Exam Physical Exam  Constitutional: He is oriented to person, place, and time. He appears well-developed and well-nourished.  HENT:  Head: Normocephalic and atraumatic.  Mouth/Throat: No oropharyngeal exudate.  Eyes:  Right pupil 3 mm left pupil 5 mm both sluggishly reactive  Neck: Normal range of motion. No JVD present. No tracheal deviation present.  Cardiovascular: Normal rate, regular rhythm, normal heart sounds and intact distal pulses.   No murmur heard. Pulmonary/Chest: Effort normal and breath  sounds normal. No stridor. No respiratory distress. He has no wheezes. He has no rales.  Abdominal: Soft. He exhibits no distension and no mass. There is no tenderness. There is no rebound and no guarding.  No significant fluid wave, no mass nor tenderness on palpation  Musculoskeletal: Normal range of motion. He exhibits no edema and no tenderness.  Lymphadenopathy:    He has no cervical adenopathy.  Neurological: He is alert and oriented to person, place, and time. He exhibits normal muscle tone. Coordination normal.  Skin: Skin is warm.  Psychiatric: He has a normal mood and affect.    Data Reviewed I discussed with Dr. Leone Payor, radiographic findings  Assessment    Symptomatic cholelithiasis, alcoholic cirrhosis    Plan    Patient symptoms have improved recently. I have offered laparoscopic cholecystectomy with intraoperative cholangiogram. I discussed the procedure in detail.  The patient was given educational  material.  We discussed the risks and benefits of a laparoscopic cholecystectomy and possible cholangiogram including, but not limited to bleeding, infection, injury to surrounding structures such as the intestine or liver, bile leak, retained gallstones, need to convert to an open procedure, prolonged diarrhea, blood clots such as  DVT, common bile duct injury, anesthesia risks, and possible need for additional procedures.  The likelihood of improvement in symptoms and return to the patient's normal status is good. We discussed the typical post-operative recovery course.  Patient is at increased risk for complications of surgery including but not limited to bleeding, infection, and conversion to open procedure. He has several trips planned out-of-town in the near future. He would like to plan surgery for when he returns. I feel will be safe to do that as his symptoms have significantly improved with diet Management.  He is going to check his schedule with his wife and call back and  we will plan surgery in late June or early July. He has agreed to call if symptoms return in the interim.       Belisa Eichholz E 11/18/2012, 12:26 PM

## 2012-11-18 NOTE — Addendum Note (Signed)
Addended by: Liz Malady on: 11/18/2012 12:33 PM   Modules accepted: Orders

## 2012-11-18 NOTE — Patient Instructions (Signed)
Call to speak with scheduling once you discuss things with your wife

## 2012-11-20 ENCOUNTER — Other Ambulatory Visit: Payer: Self-pay | Admitting: Family Medicine

## 2012-11-20 NOTE — Telephone Encounter (Signed)
Okay #90 x 0 

## 2012-11-20 NOTE — Telephone Encounter (Signed)
rx called into pharmacy

## 2012-12-01 ENCOUNTER — Ambulatory Visit (INDEPENDENT_AMBULATORY_CARE_PROVIDER_SITE_OTHER): Payer: 59 | Admitting: Internal Medicine

## 2012-12-01 ENCOUNTER — Telehealth: Payer: Self-pay

## 2012-12-01 ENCOUNTER — Encounter: Payer: Self-pay | Admitting: Internal Medicine

## 2012-12-01 VITALS — BP 126/66 | HR 62 | Ht 75.0 in | Wt 208.0 lb

## 2012-12-01 DIAGNOSIS — G4762 Sleep related leg cramps: Secondary | ICD-10-CM

## 2012-12-01 DIAGNOSIS — K8020 Calculus of gallbladder without cholecystitis without obstruction: Secondary | ICD-10-CM

## 2012-12-01 DIAGNOSIS — K7030 Alcoholic cirrhosis of liver without ascites: Secondary | ICD-10-CM

## 2012-12-01 DIAGNOSIS — I8500 Esophageal varices without bleeding: Secondary | ICD-10-CM

## 2012-12-01 NOTE — Telephone Encounter (Signed)
Message copied by [place], Marinna Blane E on Tue Dec 01, 2012  4:49 PM ------      Message from: Iva Boop      Created: Tue Dec 01, 2012  3:25 PM      Regarding: RE: ? Pneumovax on file for pt?       We should have him get one      ----- Message -----         From: Okechukwu Regnier E [place], CMA         Sent: 12/01/2012   1:58 PM           To: Iva Boop, MD      Subject: FW: ? Pneumovax on file for pt?                                      ----- Message -----         From: Cyd Silence         Sent: 12/01/2012   1:23 PM           To: Oneal Schoenberger E [place], CMA      Subject: ? Pneumovax on file for pt?                              Hi Melayna Robarts,            I got your message re this pt and I couldn't find anything indicating that he had a Pneumovax. I'm wondering if he wasn't confusing it with the Zostavax that he had early last year? I am leaving the office in 5 minutes, but I was hoping to make you aware of this before I left.                   Thanks      Celso Sickle, CMA @ [facility]       ------

## 2012-12-01 NOTE — Progress Notes (Signed)
Subjective:    Patient ID: Billy [name], male    DOB: Apr 18, 1946, 67 y.o.   MRN: [id number]  HPI He is doing well - awaiting gallbladder surgery for cholelithisais. Wanted to be sure I agreed with that. Called in about leg cramps - stopped spironolactone and they stopped. Wt Readings from Last 3 Encounters:  12/01/12 208 lb (94.348 kg)  11/18/12 205 lb (92.987 kg)  10/16/12 205 lb 3.2 oz (93.078 kg)  Denies edema  No Known Allergies Outpatient Prescriptions Prior to Visit  Medication Sig Dispense Refill  . atenolol (TENORMIN) 25 MG tablet TAKE 1 TABLET (25 MG TOTAL) BY MOUTH DAILY.  90 tablet  3  . Cholecalciferol (VITAMIN D3) 1000 UNITS CAPS Take 1 capsule by mouth daily.      Marland Kitchen HYDROcodone-acetaminophen (NORCO/VICODIN) 5-325 MG per tablet TAKE 1 TABLET BY MOUTH 3 TIMES A DAY AS NEEDED  90 tablet  0  . mirtazapine (REMERON) 45 MG tablet Take 22.5 mg by mouth at bedtime.       Marland Kitchen omeprazole (PRILOSEC) 40 MG capsule TAKE 1 CAPSULE (40 MG TOTAL) BY MOUTH DAILY.  90 capsule  2  . VIAGRA 50 MG tablet TAKE ONE BY MOUTH AS DIRECTED  10 tablet  0  . ferrous sulfate 325 (65 FE) MG EC tablet Take 1 tablet (325 mg total) by mouth daily.  60 tablet  3  . spironolactone (ALDACTONE) 25 MG tablet Take 25 mg by mouth daily.          Past Medical History  Diagnosis Date  . Depression     mild at most  . Diverticulosis of colon   . GERD (gastroesophageal reflux disease)   . Hypertension   . Sleep disturbance   . DDD (degenerative disc disease)   . Erectile dysfunction   . GI hemorrhage 2006, 2013    esophagitis and 1+ varices, melena in 2013   . Alcoholism   . Acute posthemorrhagic anemia   . Thrombocytopenia, unspecified   . Personal history of colonic polyps   . 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 Basal cell carcinoma   . Gastric ulcer 08/2011    small, antral ulcer  . Angiodysplasia of colon 09/11/2011    Multiple in  the right colon, ablated with APC. Question if this is portal colopathy.   . 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 Campylobacter diarrhea 01/07/2012  . Alcoholic cirrhosis of liver 09/24/2011    Years of alcohol use. Progressed from fatty liver to cirrhosis based upon review imaging. Manifestations are portal hypertension with esophageal varices, portal gastropathy, rectal varices and he may have portal colopathy. Ascites also present. Naive to HAV and HBV Claims he has had pneumococcal vaccine    Past Surgical History  Procedure Laterality Date  . Varicocele repair  1980's  . Knee arthroscopy  2008    Right  . Knee arthroscopy  2008    Left  . Colonoscopy  2001, 200411/02/2006  . Esophagogastroduodenoscopy  2006  . Esophagogastroduodenoscopy  09/11/2011    Procedure: ESOPHAGOGASTRODUODENOSCOPY (EGD);  Surgeon: Iva Boop, MD;  Location: [facility];  Service: Endoscopy;  Laterality: N/A;  . Colonoscopy  09/11/2011    Procedure: COLONOSCOPY;  Surgeon: Iva Boop, MD;  Location: [facility];  Service: Endoscopy;  Laterality: N/A;  . Cataract extraction  2013    bilateral with IOL  . [place] thora/paracentesis  10/28/2011  . Eye surgery    .  Flexible sigmoidoscopy  01/07/2012    Procedure: FLEXIBLE SIGMOIDOSCOPY;  Surgeon: Hart Carwin, MD;  Location: [facility];  Service: Endoscopy;  Laterality: N/A;         Review of Systems As above    Objective:   Physical Exam General:  NAD Eyes:   anicteric Lungs:  clear Heart:  S1S2 no rubs, murmurs or gallops Abdomen:  soft and nontender, BS+ Ext:   no edema    Data Reviewed:  As above    Assessment & Plan:  Symptomatic cholelithiasis   I think cholecystectomy makes sense. Has cirrhosis but is Child-Pugh Class A. That could deteriorate so now is a good  time to have cholecystectomy.  Alcoholic cirrhosis of liver   Stable - will double check re: pneumovax - says he had with PCP but not in current system (? Before Epic) Nocturnal  leg cramps   Resolved off spironolactone. Will leave off and observe for edema/ascites.  May need to resume diuretics post-op lap chole Esophageal Varices   May have bled in past vs. ulcer also has portal gastropathy - have not yet changed from atenolol to non-selective beta blocker but  appropriate to do so.  Will call the patient and change atenolol to Nadolol 40 mg daily and recheck BPNadolol will better reduce chance of variceal bleeding as is  non-selective beta blocker. Will check w/ PCP first.     Medication List       These changes are accurate as of: 12/01/2012 12:24 PM. If you have any questions, ask your nurse or doctor.          STOP taking these medications       ferrous sulfate 325 (65 FE) MG EC tablet  Stopped by:  Iva Boop, MD     spironolactone 25 MG tablet  Commonly known as:  ALDACTONE  Stopped by:  Iva Boop, MD      TAKE these medications       atenolol 25 MG tablet      Will change to Nadolol 40 mg daily if ok w/ Dr. Alphonsus Sias  Commonly known as:  TENORMIN  TAKE 1 TABLET (25 MG TOTAL) BY MOUTH DAILY.     HYDROcodone-acetaminophen 5-325 MG per tablet  Commonly known as:  NORCO/VICODIN  TAKE 1 TABLET BY MOUTH 3 TIMES A DAY AS NEEDED     mirtazapine 45 MG tablet  Commonly known as:  REMERON  Take 22.5 mg by mouth at bedtime.     omeprazole 40 MG capsule  Commonly known as:  PRILOSEC  TAKE 1 CAPSULE (40 MG TOTAL) BY MOUTH DAILY.     VIAGRA 50 MG tablet  Generic drug:  sildenafil  TAKE ONE BY MOUTH AS DIRECTED     Vitamin D3 1000 UNITS Caps  Take 1 capsule by mouth daily.         Cc: Violeta Gelinas, MD, Tillman Abide, MD

## 2012-12-01 NOTE — Patient Instructions (Addendum)
Stop spironolactone and watch weight and legs for swelling. Let me know if that occurs. You will need to call us later this summer to make an appointment for 6 months from now - (October or November) Good luck with your surgery.  Keep up the good work!  I appreciate the opportunity to care for you.  Iva Boop, MD, Clementeen Graham

## 2012-12-01 NOTE — Telephone Encounter (Signed)
Left message on his voice mail to call me back , that Dr. Leone Payor does want him to get a pneumovax.  Either here or at PCP if that is closer.

## 2012-12-10 ENCOUNTER — Telehealth: Payer: Self-pay

## 2012-12-10 MED ORDER — NADOLOL 40 MG PO TABS: 40.0000 mg | ORAL_TABLET | Freq: Every day | ORAL | Status: DC

## 2012-12-10 NOTE — Telephone Encounter (Signed)
Left message for patient to call back Rx sent        Need to change him from Atenolol to Nadolol 40 mg daily as described below Will need a BP and pulse check 1 week after Can Rx #30 w/ 11 refills Please explain to him that this is better overall for him (BP and liver) and that I cleared w/ Dr. Alphonsus Sias   ----- Message ----- From: Karie Schwalbe, MD Sent: 12/01/2012 1:43 PM To: Iva Boop, MD That sounds fine to me Rich  ----- Message ----- From: Iva Boop, MD Sent: 12/01/2012 12:33 PM To: Karie Schwalbe, MD Is it ok for me to switch from Atenolol 25 to Nadolol 40 - the nadolol will decrease risk of future variceal bleeding but atenolol not proven to do so Thanks

## 2012-12-10 NOTE — Telephone Encounter (Signed)
Patient notified.  He will come for BP check and pneumovax on 12/17/12 10:00

## 2012-12-17 ENCOUNTER — Ambulatory Visit (INDEPENDENT_AMBULATORY_CARE_PROVIDER_SITE_OTHER): Payer: 59 | Admitting: Internal Medicine

## 2012-12-17 VITALS — BP 118/66

## 2012-12-17 DIAGNOSIS — K7460 Unspecified cirrhosis of liver: Secondary | ICD-10-CM

## 2012-12-17 DIAGNOSIS — Z23 Encounter for immunization: Secondary | ICD-10-CM

## 2012-12-17 DIAGNOSIS — K7030 Alcoholic cirrhosis of liver without ascites: Secondary | ICD-10-CM

## 2012-12-17 NOTE — Telephone Encounter (Signed)
Patient came in 12/17/2012 for pneumovax.

## 2012-12-21 ENCOUNTER — Other Ambulatory Visit: Payer: Self-pay | Admitting: Internal Medicine

## 2012-12-21 ENCOUNTER — Encounter (HOSPITAL_COMMUNITY): Payer: Self-pay | Admitting: Pharmacy Technician

## 2012-12-21 NOTE — Telephone Encounter (Signed)
Okay #90 x 0 

## 2012-12-21 NOTE — Telephone Encounter (Signed)
Pt left v/m requesting status of hydrocodone refill.Please advise.

## 2012-12-21 NOTE — Telephone Encounter (Signed)
Pt request status of hydrocodone refill; Medication phoned to [facility] as instructed.Pt notified done.

## 2012-12-23 ENCOUNTER — Encounter (HOSPITAL_COMMUNITY)
Admission: RE | Admit: 2012-12-23 | Discharge: 2012-12-23 | Disposition: A | Discharge status: Home / Self Care | Payer: 59 | Source: Ambulatory Visit | Attending: General Surgery | Admitting: General Surgery

## 2012-12-23 ENCOUNTER — Encounter (HOSPITAL_COMMUNITY): Payer: Self-pay

## 2012-12-23 ENCOUNTER — Encounter (HOSPITAL_COMMUNITY)
Admission: RE | Admit: 2012-12-23 | Discharge: 2012-12-23 | Disposition: A | Discharge status: Home / Self Care | Payer: 59 | Source: Ambulatory Visit | Attending: Anesthesiology | Admitting: Anesthesiology

## 2012-12-23 DIAGNOSIS — Z01812 Encounter for preprocedural laboratory examination: Secondary | ICD-10-CM | POA: Insufficient documentation

## 2012-12-23 DIAGNOSIS — Z01818 Encounter for other preprocedural examination: Secondary | ICD-10-CM | POA: Insufficient documentation

## 2012-12-23 HISTORY — DX: Personal history of other medical treatment: Z92.89

## 2012-12-23 HISTORY — DX: Dorsalgia, unspecified: M54.9

## 2012-12-23 HISTORY — DX: Hydrocele, unspecified: N43.3

## 2012-12-23 HISTORY — DX: Personal history of other diseases of the respiratory system: Z87.09

## 2012-12-23 LAB — CBC
HCT: 44.2 % (ref 39.0–52.0)
Hemoglobin: 15.2 g/dL (ref 13.0–17.0)
MCV: 92.9 fL (ref 78.0–100.0)
RBC: 4.76 MIL/uL (ref 4.22–5.81)
WBC: 5.8 10*3/uL (ref 4.0–10.5)

## 2012-12-23 LAB — COMPREHENSIVE METABOLIC PANEL
ALT: 39 U/L (ref 0–53)
AST: 61 U/L — ABNORMAL HIGH (ref 0–37)
Alkaline Phosphatase: 99 U/L (ref 39–117)
CO2: 28 mEq/L (ref 19–32)
GFR calc Af Amer: >90 mL/min (ref >90)
GFR calc non Af Amer: >90 mL/min (ref >90)
Glucose, Bld: 112 mg/dL — ABNORMAL HIGH (ref 70–99)
Potassium: 4.6 mEq/L (ref 3.5–5.1)
Sodium: 139 mEq/L (ref 135–145)

## 2012-12-23 LAB — SURGICAL PCR SCREEN: Staphylococcus aureus: NEGATIVE

## 2012-12-23 MED ORDER — CHLORHEXIDINE GLUCONATE 4 % EX LIQD: 1.0000 "application " | Freq: Once | CUTANEOUS | Status: DC

## 2012-12-23 NOTE — Progress Notes (Signed)
Pt doesn't have a cardiologist  Echo was done in 2008-was normal  Denies ever having a heart cath or stress test  Dr.Letvak is Medical Md  EKG report in from 6-28/13

## 2012-12-23 NOTE — Pre-Procedure Instructions (Signed)
Billy [name]  12/23/2012   Your procedure is scheduled on:  Thurs, June 19 @ 10:45 AM  Report to [facility] at 8:45 AM.  Call this number if you have problems the morning of surgery: [phone]   Remember:   Do not eat food or drink liquids after midnight.   Take these medicines the morning of surgery with A SIP OF WATER: Pain Pill(if needed),Nadolol(Corgard),and Omeprazole(Prilosec)   Do not wear jewelry  Do not wear lotions, powders, or colognes. You may wear deodorant.             Men may shave face and neck.  Do not bring valuables to the hospital.  [facility] is not responsible                   for any belongings or valuables.  Contacts, dentures or bridgework may not be worn into surgery.  Leave suitcase in the car. After surgery it may be brought to your room.  For patients admitted to the hospital, checkout time is 11:00 AM the day of  discharge.   Patients discharged the day of surgery will not be allowed to drive  home.    Special Instructions: Shower using CHG 2 nights before surgery and the night before surgery.  If you shower the day of surgery use CHG.  Use special wash - you have one bottle of CHG for all showers.  You should use approximately 1/3 of the bottle for each shower.   Please read over the following fact sheets that you were given: Pain Booklet, Coughing and Deep Breathing, MRSA Information and Surgical Site Infection Prevention

## 2012-12-30 MED ORDER — CEFAZOLIN SODIUM-DEXTROSE 2-3 GM-% IV SOLR
2.0000 g | INTRAVENOUS | Status: DC
  Filled 2012-12-30: qty 50

## 2012-12-31 ENCOUNTER — Encounter (HOSPITAL_COMMUNITY)
Admission: RE | Disposition: A | Discharge status: Home / Self Care | Payer: Self-pay | Source: Ambulatory Visit | Attending: General Surgery

## 2012-12-31 ENCOUNTER — Encounter (HOSPITAL_COMMUNITY): Payer: Self-pay | Admitting: Anesthesiology

## 2012-12-31 ENCOUNTER — Ambulatory Visit (HOSPITAL_COMMUNITY)
Admission: RE | Admit: 2012-12-31 | Discharge: 2012-12-31 | Disposition: A | Discharge status: Home / Self Care | Payer: 59 | Source: Ambulatory Visit | Attending: General Surgery | Admitting: General Surgery

## 2012-12-31 ENCOUNTER — Ambulatory Visit (HOSPITAL_COMMUNITY): Payer: 59 | Admitting: Anesthesiology

## 2012-12-31 ENCOUNTER — Encounter (HOSPITAL_COMMUNITY): Payer: Self-pay | Admitting: Certified Registered"

## 2012-12-31 DIAGNOSIS — F1020 Alcohol dependence, uncomplicated: Secondary | ICD-10-CM | POA: Insufficient documentation

## 2012-12-31 DIAGNOSIS — K8020 Calculus of gallbladder without cholecystitis without obstruction: Secondary | ICD-10-CM | POA: Insufficient documentation

## 2012-12-31 DIAGNOSIS — Z79899 Other long term (current) drug therapy: Secondary | ICD-10-CM | POA: Insufficient documentation

## 2012-12-31 DIAGNOSIS — I10 Essential (primary) hypertension: Secondary | ICD-10-CM | POA: Insufficient documentation

## 2012-12-31 DIAGNOSIS — R188 Other ascites: Secondary | ICD-10-CM | POA: Insufficient documentation

## 2012-12-31 DIAGNOSIS — K8010 Calculus of gallbladder with chronic cholecystitis without obstruction: Secondary | ICD-10-CM

## 2012-12-31 DIAGNOSIS — K7030 Alcoholic cirrhosis of liver without ascites: Secondary | ICD-10-CM | POA: Insufficient documentation

## 2012-12-31 HISTORY — PX: CHOLECYSTECTOMY: SHX55

## 2012-12-31 SURGERY — LAPAROSCOPIC CHOLECYSTECTOMY
Anesthesia: General | Site: Abdomen | Wound class: Clean Contaminated

## 2012-12-31 MED ORDER — ROCURONIUM BROMIDE 100 MG/10ML IV SOLN
INTRAVENOUS | Status: DC | PRN
  Administered 2012-12-31: 40 mg via INTRAVENOUS

## 2012-12-31 MED ORDER — SODIUM CHLORIDE 0.9 % IR SOLN
Status: DC | PRN
  Administered 2012-12-31: 1000 mL

## 2012-12-31 MED ORDER — FENTANYL CITRATE 0.05 MG/ML IJ SOLN: 50.0000 ug | Freq: Once | INTRAMUSCULAR | Status: DC

## 2012-12-31 MED ORDER — HYDROMORPHONE HCL PF 1 MG/ML IJ SOLN
0.2500 mg | INTRAMUSCULAR | Status: DC | PRN
  Administered 2012-12-31 (×2): 0.5 mg via INTRAVENOUS

## 2012-12-31 MED ORDER — ONDANSETRON HCL 4 MG/2ML IJ SOLN
INTRAMUSCULAR | Status: DC | PRN
  Administered 2012-12-31: 4 mg via INTRAVENOUS

## 2012-12-31 MED ORDER — PROMETHAZINE HCL 25 MG/ML IJ SOLN: 6.2500 mg | INTRAMUSCULAR | Status: DC | PRN

## 2012-12-31 MED ORDER — GLYCOPYRROLATE 0.2 MG/ML IJ SOLN
INTRAMUSCULAR | Status: DC | PRN
  Administered 2012-12-31: 0.3 mg via INTRAVENOUS

## 2012-12-31 MED ORDER — LIDOCAINE HCL (CARDIAC) 20 MG/ML IV SOLN
INTRAVENOUS | Status: DC | PRN
  Administered 2012-12-31: 100 mg via INTRAVENOUS

## 2012-12-31 MED ORDER — MIDAZOLAM HCL 5 MG/5ML IJ SOLN
INTRAMUSCULAR | Status: DC | PRN
  Administered 2012-12-31: 2 mg via INTRAVENOUS

## 2012-12-31 MED ORDER — FENTANYL CITRATE 0.05 MG/ML IJ SOLN
INTRAMUSCULAR | Status: DC | PRN
  Administered 2012-12-31 (×2): 50 ug via INTRAVENOUS

## 2012-12-31 MED ORDER — OXYCODONE HCL 5 MG/5ML PO SOLN: 5.0000 mg | Freq: Once | ORAL | Status: AC | PRN

## 2012-12-31 MED ORDER — MIDAZOLAM HCL 2 MG/2ML IJ SOLN: 1.0000 mg | INTRAMUSCULAR | Status: DC | PRN

## 2012-12-31 MED ORDER — PROPOFOL 10 MG/ML IV BOLUS
INTRAVENOUS | Status: DC | PRN
  Administered 2012-12-31: 180 mg via INTRAVENOUS

## 2012-12-31 MED ORDER — BUPIVACAINE-EPINEPHRINE PF 0.25-1:200000 % IJ SOLN
INTRAMUSCULAR | Status: AC
  Filled 2012-12-31: qty 30

## 2012-12-31 MED ORDER — LACTATED RINGERS IV SOLN
INTRAVENOUS | Status: DC | PRN
  Administered 2012-12-31 (×2): via INTRAVENOUS

## 2012-12-31 MED ORDER — NEOSTIGMINE METHYLSULFATE 1 MG/ML IJ SOLN
INTRAMUSCULAR | Status: DC | PRN
  Administered 2012-12-31: 3 mg via INTRAVENOUS

## 2012-12-31 MED ORDER — OXYCODONE HCL 5 MG PO TABS
5.0000 mg | ORAL_TABLET | Freq: Once | ORAL | Status: AC | PRN
  Administered 2012-12-31: 5 mg via ORAL
  Filled 2012-12-31: qty 1

## 2012-12-31 MED ORDER — HYDROMORPHONE HCL PF 1 MG/ML IJ SOLN
INTRAMUSCULAR | Status: AC
  Filled 2012-12-31: qty 1

## 2012-12-31 MED ORDER — OXYCODONE HCL 5 MG PO TABS: 5.0000 mg | ORAL_TABLET | Freq: Four times a day (QID) | ORAL | Status: DC | PRN

## 2012-12-31 MED ORDER — 0.9 % SODIUM CHLORIDE (POUR BTL) OPTIME
TOPICAL | Status: DC | PRN
  Administered 2012-12-31: 1000 mL

## 2012-12-31 MED ORDER — HEMOSTATIC AGENTS (NO CHARGE) OPTIME
TOPICAL | Status: DC | PRN
  Administered 2012-12-31: 1 via TOPICAL

## 2012-12-31 MED ORDER — BUPIVACAINE-EPINEPHRINE 0.25% -1:200000 IJ SOLN
INTRAMUSCULAR | Status: DC | PRN
  Administered 2012-12-31: 30 mL

## 2012-12-31 MED ORDER — SODIUM CHLORIDE 0.9 % IV SOLN
INTRAVENOUS | Status: DC | PRN
  Administered 2012-12-31: 10:00:00

## 2012-12-31 SURGICAL SUPPLY — 57 items
ADH SKN CLS APL DERMABOND .7 (GAUZE/BANDAGES/DRESSINGS) ×2
ADH SKN CLS LQ APL DERMABOND (GAUZE/BANDAGES/DRESSINGS) ×2
APPLIER CLIP 5 13 M/L LIGAMAX5 (MISCELLANEOUS) ×3
APPLIER CLIP ROT 10 11.4 M/L (STAPLE)
APR CLP MED LRG 11.4X10 (STAPLE)
APR CLP MED LRG 5 ANG JAW (MISCELLANEOUS) ×2
BAG SPEC RTRVL LRG 6X4 10 (ENDOMECHANICALS) ×2
BLADE SURG ROTATE 9660 (MISCELLANEOUS) IMPLANT
CANISTER SUCTION 2500CC (MISCELLANEOUS) ×3 IMPLANT
CHLORAPREP W/TINT 26ML (MISCELLANEOUS) ×3 IMPLANT
CLIP APPLIE 5 13 M/L LIGAMAX5 (MISCELLANEOUS) ×2 IMPLANT
CLIP APPLIE ROT 10 11.4 M/L (STAPLE) IMPLANT
CLOTH BEACON ORANGE TIMEOUT ST (SAFETY) ×3 IMPLANT
COVER MAYO STAND STRL (DRAPES) ×3 IMPLANT
COVER SURGICAL LIGHT HANDLE (MISCELLANEOUS) ×3 IMPLANT
DECANTER SPIKE VIAL GLASS SM (MISCELLANEOUS) ×6 IMPLANT
DERMABOND ADHESIVE PROPEN (GAUZE/BANDAGES/DRESSINGS) ×1
DERMABOND ADVANCED (GAUZE/BANDAGES/DRESSINGS) ×1
DERMABOND ADVANCED .7 DNX12 (GAUZE/BANDAGES/DRESSINGS) ×2 IMPLANT
DERMABOND ADVANCED .7 DNX6 (GAUZE/BANDAGES/DRESSINGS) ×1 IMPLANT
DRAPE C-ARM 42X72 X-RAY (DRAPES) ×3 IMPLANT
DRAPE UTILITY 15X26 W/TAPE STR (DRAPE) ×6 IMPLANT
ELECT REM PT RETURN 9FT ADLT (ELECTROSURGICAL) ×3
ELECTRODE REM PT RTRN 9FT ADLT (ELECTROSURGICAL) ×2 IMPLANT
FILTER SMOKE EVAC LAPAROSHD (FILTER) ×2 IMPLANT
GLOVE BIO SURGEON STRL SZ8 (GLOVE) ×3 IMPLANT
GLOVE BIOGEL PI IND STRL 6.5 (GLOVE) ×1 IMPLANT
GLOVE BIOGEL PI IND STRL 7.0 (GLOVE) ×2 IMPLANT
GLOVE BIOGEL PI IND STRL 8 (GLOVE) ×2 IMPLANT
GLOVE BIOGEL PI INDICATOR 6.5 (GLOVE) ×1
GLOVE BIOGEL PI INDICATOR 7.0 (GLOVE) ×2
GLOVE BIOGEL PI INDICATOR 8 (GLOVE) ×1
GLOVE SKINSENSE NS SZ6.5 (GLOVE) ×1
GLOVE SKINSENSE NS SZ7.0 (GLOVE) ×2
GLOVE SKINSENSE STRL SZ6.5 (GLOVE) ×1 IMPLANT
GLOVE SKINSENSE STRL SZ7.0 (GLOVE) ×2 IMPLANT
GOWN STRL NON-REIN LRG LVL3 (GOWN DISPOSABLE) ×9 IMPLANT
GOWN STRL REIN XL XLG (GOWN DISPOSABLE) ×3 IMPLANT
HEMOSTAT SNOW SURGICEL 2X4 (HEMOSTASIS) ×2 IMPLANT
KIT BASIN OR (CUSTOM PROCEDURE TRAY) ×3 IMPLANT
KIT ROOM TURNOVER OR (KITS) ×3 IMPLANT
NEEDLE 22X1 1/2 (OR ONLY) (NEEDLE) ×3 IMPLANT
NS IRRIG 1000ML POUR BTL (IV SOLUTION) ×3 IMPLANT
PAD ARMBOARD 7.5X6 YLW CONV (MISCELLANEOUS) ×3 IMPLANT
POUCH SPECIMEN RETRIEVAL 10MM (ENDOMECHANICALS) ×3 IMPLANT
SCISSORS LAP 5X35 DISP (ENDOMECHANICALS) IMPLANT
SET CHOLANGIOGRAPH 5 50 .035 (SET/KITS/TRAYS/PACK) ×3 IMPLANT
SET IRRIG TUBING LAPAROSCOPIC (IRRIGATION / IRRIGATOR) ×3 IMPLANT
SLEEVE ENDOPATH XCEL 5M (ENDOMECHANICALS) ×6 IMPLANT
SPECIMEN JAR SMALL (MISCELLANEOUS) ×3 IMPLANT
SUT VIC AB 4-0 PS2 27 (SUTURE) ×3 IMPLANT
TOWEL OR 17X24 6PK STRL BLUE (TOWEL DISPOSABLE) ×3 IMPLANT
TOWEL OR 17X26 10 PK STRL BLUE (TOWEL DISPOSABLE) ×3 IMPLANT
TRAY LAPAROSCOPIC (CUSTOM PROCEDURE TRAY) ×3 IMPLANT
TROCAR XCEL BLUNT TIP 100MML (ENDOMECHANICALS) ×3 IMPLANT
TROCAR XCEL NON-BLD 5MMX100MML (ENDOMECHANICALS) ×3 IMPLANT
WATER STERILE IRR 1000ML POUR (IV SOLUTION) IMPLANT

## 2012-12-31 NOTE — Transfer of Care (Signed)
Immediate Anesthesia Transfer of Care Note  Patient: Billy [name]  Procedure(s) Performed: Procedure(s): LAPAROSCOPIC CHOLECYSTECTOMY  Patient Location: PACU  Anesthesia Type:General  Level of Consciousness: awake, alert  and oriented  Airway & Oxygen Therapy: Patient Spontanous Breathing and Patient connected to nasal cannula oxygen  Post-op Assessment: Report given to PACU RN and Post -op Vital signs reviewed and stable  Post vital signs: Reviewed and stable  Complications: No apparent anesthesia complications

## 2012-12-31 NOTE — Anesthesia Procedure Notes (Signed)
Procedure Name: Intubation Date/Time: 12/31/2012 9:51 AM Performed by: Arlice Colt B Pre-anesthesia Checklist: Patient identified, Emergency Drugs available, Suction available, Patient being monitored and Timeout performed Patient Re-evaluated:Patient Re-evaluated prior to inductionOxygen Delivery Method: Circle system utilized Preoxygenation: Pre-oxygenation with 100% oxygen Intubation Type: IV induction Ventilation: Mask ventilation without difficulty Laryngoscope size: unable to view cords with MAC 3 blade. Tube type: Oral Tube size: 7.5 mm Number of attempts: 2 Airway Equipment and Method: Stylet and Video-laryngoscopy (grade 2 view with glidescope) Placement Confirmation: ETT inserted through vocal cords under direct vision,  positive ETCO2 and breath sounds checked- equal and bilateral Secured at: 23 cm Tube secured with: Tape Dental Injury: Teeth and Oropharynx as per pre-operative assessment

## 2012-12-31 NOTE — Interval H&P Note (Signed)
History and Physical Interval Note:  12/31/2012 9:37 AM  Billy [name]  has presented today for surgery, with the diagnosis of symptomatic cholelithiasis  The various methods of treatment have been discussed with the patient and family. After consideration of risks, benefits and other options for treatment, the patient has consented to  Procedure(s): LAPAROSCOPIC CHOLECYSTECTOMY WITH INTRAOPERATIVE CHOLANGIOGRAM (N/A) as a surgical intervention .  The patient's history has been reviewed, patient re-examined, no change in status, stable for surgery.  I have reviewed the patient's chart and labs.  Questions were answered to the patient's satisfaction.     Cheryll Keisler E

## 2012-12-31 NOTE — Op Note (Signed)
12/31/2012  10:50 AM  PATIENT:  Billy [name]  67 y.o. male  PRE-OPERATIVE DIAGNOSIS:  symptomatic cholelithiasis, alcoholic cirrhosis  POST-OPERATIVE DIAGNOSIS:  symptomatic cholelithiasis, alcoholic cirrhosis  PROCEDURE:  Procedure(s): LAPAROSCOPIC CHOLECYSTECTOMY  SURGEON:  Surgeon(s): Liz Malady, MD  ASSISTANTS: none   ANESTHESIA:   local and general  EBL:  Total I/O In: 1000 [I.V.:1000] Out: -   BLOOD ADMINISTERED:none  DRAINS: none   SPECIMEN:  Excision  DISPOSITION OF SPECIMEN:  PATHOLOGY  COUNTS:  YES  DICTATION: Reubin Milan Dictation  Patient presents for laparoscopic cholecystectomy. Patient was identified in the preop holding area. He received intravenous antibiotics. Informed consent was obtained. He was brought to the operating room and general endotracheal anesthesia was a Optician, dispensing by the anesthesia staff. His abdomen was prepped and draped in sterile fashion. We did a time out procedure. Infraumbilical region was infiltrated with local. Infraumbilical incision was made. Subcutaneous tissues were dissected down revealing the anterior fascia. This was divided sharply along the midline. The peritoneal cavity was entered gently under direct vision. 0 Vicryl pursestring suture was placed on the fascial opening. Hassan trocar was inserted. Abdomen was insufflated with carbon dioxide in standard fashion. Laparoscopic exploration revealed intra-abdominal varices and significantly macronodular liver. There was a small amount of ascites. 5 mm epigastric and 2 right 5 mm ports were placed under direct vision. Local was used at each port site.The gallbladder was retracted superior medially. The infundibulum was retracted inferior laterally.Dissection began laterally and progressed medially easily identifying the cystic duct. It was dissected until critical view was obtained between the cystic duct the liver and the gallbladder. Once we had this visualization the cystic duct  was judged to be rather short. Decision was made to forego cholangiogram. 3 clips were placed proximally on the cystic duct one was placed distally and it was divided. Further dissection revealed the cystic artery. This was dissected out, clipped proximally 3 times and clipped distally once and divided. The gallbladder was then gradually taken off the liver bed using the Bovie cautery. The gallbladder was placed in Endo Catch bag and removed from the abdomen via the infraumbilical port site. Liver bed was copiously irrigated. Hemostasis was obtained with cautery. There very dry. In light of the patient's history of cirrhosis the pieces of Surgicel snow was placed additionally. The area looked. Mended irrigation fluid was evacuated. The abdomen was inspected there was no bleeding noted from the omentum or other varices. No other abnormalities were seen. Ports were removed under direct vision. Pneumoperitoneum was released. Infraumbilical fascia was closed by tying the 0 Vicryl pursestring suture with care not to trap any intra-abdominal contents. An additional reinforcing figure-of-eight 0 Vicryl suture was placed at the fascial opening there. All 4 wounds were copiously irrigated and the skin of each was closed with running 4 Vicryl followed by Dermabond. All counts were correct. he tolerated the procedure well without apparent complication & was taken recovery in stable condition.  PATIENT DISPOSITION:  PACU - hemodynamically stable.   Delay start of Pharmacological VTE agent (>24hrs) due to surgical blood loss or risk of bleeding:  no  Violeta Gelinas, MD, MPH, FACS Pager: [phone]  6/19/201410:50 AM

## 2012-12-31 NOTE — H&P (Signed)
HPI  Billy [name] is a 67 y.o. male. Chief complaint: Right abdominal pain  HPI  Patient is followed closely by Dr. Stan Head at [facility] gastroenterology regarding alcoholic cirrhosis. He has had this diagnosis for one year. He has not had any alcohol since the diagnosis. He had 3 episodes of acute right upper quadrant abdominal pain, last being over a month ago. He noticed them occur after breakfast each time and last approximately 5 hours with spontaneous resolution. He was worked up further by Dr. Leone Payor including CT scan of the abdomen and pelvis demonstrating evidence of cirrhosis with sequelae of portal hypertension. He also had a questionable gallstone. Further abdominal ultrasound did reveal gallstones but no evidence of cholecystitis. Paracentesis was scheduled but he did not have significant ascites so that was not done. He has been walking 3 miles a day and eating a healthy diet and has not had another gallbladder attack for over a month.  Past Medical History   Diagnosis  Date   .  Depression      mild at most   .  Diverticulosis of colon    .  GERD (gastroesophageal reflux disease)    .  Hypertension    .  Sleep disturbance    .  DDD (degenerative disc disease)    .  Erectile dysfunction    .  GI hemorrhage  2006, 2013     esophagitis and 1+ varices, melena in 2013   .  Alcoholism    .  Acute posthemorrhagic anemia    .  Thrombocytopenia, unspecified    .  Personal history of colonic polyps    .  Portal hypertensive gastropathy  08/2011   .  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  Basal cell carcinoma    .  Gastric ulcer  08/2011     small, antral ulcer   .  Angiodysplasia of colon  09/11/2011     Multiple in the right colon, ablated with APC. Question if this is portal colopathy.   .  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  Campylobacter diarrhea  01/07/2012   .  Alcoholic cirrhosis of liver  09/24/2011     Years of alcohol use. Progressed from fatty liver  to cirrhosis based upon review imaging. Manifestations are portal hypertension with esophageal varices, portal gastropathy, rectal varices and he may have portal colopathy. Ascites also present. Naive to HAV and HBV Claims he has had pneumococcal vaccine    Past Surgical History   Procedure  Laterality  Date   .  Varicocele repair   1980's   .  Knee arthroscopy   2008     Right   .  Knee arthroscopy   2008     Left   .  Colonoscopy   2001, 200411/02/2006   .  Esophagogastroduodenoscopy   2006   .  Esophagogastroduodenoscopy   09/11/2011     Procedure: ESOPHAGOGASTRODUODENOSCOPY (EGD); Surgeon: Iva Boop, MD; Location: [facility]; Service: Endoscopy; Laterality: N/A;   .  Colonoscopy   09/11/2011     Procedure: COLONOSCOPY; Surgeon: Iva Boop, MD; Location: [facility]; Service: Endoscopy; Laterality: N/A;   .  Cataract extraction   2013     bilateral with IOL   .  [place] thora/paracentesis   10/28/2011   .  Eye surgery     .  Flexible sigmoidoscopy   01/07/2012     Procedure: FLEXIBLE  SIGMOIDOSCOPY; Surgeon: Hart Carwin, MD; Location: [facility]; Service: Endoscopy; Laterality: N/A;    Family History   Problem  Relation  Age of Onset   .  Stroke  Mother    .  Diabetes  Mother    .  Colon cancer  Neg Hx    Social History  History   Substance Use Topics   .  Smoking status:  Former Smoker -- 0.50 packs/day     Types:  Cigarettes     Quit date:  08/15/2012   .  Smokeless tobacco:  Never Used   .  Alcohol Use:  No   No Known Allergies  Current Outpatient Prescriptions   Medication  Sig  Dispense  Refill   .  atenolol (TENORMIN) 25 MG tablet  TAKE 1 TABLET (25 MG TOTAL) BY MOUTH DAILY.  90 tablet  3   .  Cholecalciferol (VITAMIN D3) 1000 UNITS CAPS  Take 1 capsule by mouth daily.     .  ferrous sulfate 325 (65 FE) MG EC tablet  Take 1 tablet (325 mg total) by mouth daily.  60 tablet  3   .  mirtazapine (REMERON) 45 MG tablet  Take 22.5 mg by mouth at bedtime.     Marland Kitchen   omeprazole (PRILOSEC) 40 MG capsule  TAKE 1 CAPSULE (40 MG TOTAL) BY MOUTH DAILY.  90 capsule  2   .  VIAGRA 50 MG tablet  TAKE ONE BY MOUTH AS DIRECTED  10 tablet  0   .  HYDROcodone-acetaminophen (NORCO/VICODIN) 5-325 MG per tablet  TAKE 1 TABLET BY MOUTH 3 TIMES A DAY AS NEEDED  90 tablet  0   .  spironolactone (ALDACTONE) 25 MG tablet  Take 25 mg by mouth daily.      No current facility-administered medications for this visit.   Review of Systems  Review of Systems  Constitutional: Negative for fever, chills and unexpected weight change.  HENT: Negative for hearing loss, congestion, sore throat, trouble swallowing and voice change.  Eyes: Negative for visual disturbance.  Respiratory: Negative for cough and wheezing.  Cardiovascular: Negative for chest pain, palpitations and leg swelling.  Gastrointestinal:  See history of present illness  Genitourinary: Negative for hematuria and difficulty urinating.  Musculoskeletal: Negative for arthralgias.  Skin: Negative for rash and wound.  Neurological: Negative for seizures, syncope, weakness and headaches.  Hematological: Negative for adenopathy. Does not bruise/bleed easily.  Psychiatric/Behavioral: Negative for confusion.  Blood pressure 146/74, pulse 60, temperature 97.6 F (36.4 C), temperature source Oral, resp. rate 16, height 6\' 3"  (1.905 m), weight 205 lb (92.987 kg).  Physical Exam  Physical Exam  Constitutional: He is oriented to person, place, and time. He appears well-developed and well-nourished.  HENT:  Head: Normocephalic and atraumatic.  Mouth/Throat: No oropharyngeal exudate.  Eyes:  Right pupil 3 mm left pupil 5 mm both sluggishly reactive  Neck: Normal range of motion. No JVD present. No tracheal deviation present.  Cardiovascular: Normal rate, regular rhythm, normal heart sounds and intact distal pulses.  No murmur heard.  Pulmonary/Chest: Effort normal and breath sounds normal. No stridor. No respiratory distress.  He has no wheezes. He has no rales.  Abdominal: Soft. He exhibits no distension and no mass. There is no tenderness. There is no rebound and no guarding.  No significant fluid wave, no mass nor tenderness on palpation  Musculoskeletal: Normal range of motion. He exhibits no edema and no tenderness.  Lymphadenopathy:  He has no cervical  adenopathy.  Neurological: He is alert and oriented to person, place, and time. He exhibits normal muscle tone. Coordination normal.  Skin: Skin is warm.  Psychiatric: He has a normal mood and affect.  Data Reviewed  I discussed with Dr. Leone Payor, radiographic findings  Assessment  Symptomatic cholelithiasis, alcoholic cirrhosis  Plan  Patient symptoms have improved recently. I have offered laparoscopic cholecystectomy with intraoperative cholangiogram. I discussed the procedure in detail. The patient was given Agricultural engineer. We discussed the risks and benefits of a laparoscopic cholecystectomy and possible cholangiogram including, but not limited to bleeding, infection, injury to surrounding structures such as the intestine or liver, bile leak, retained gallstones, need to convert to an open procedure, prolonged diarrhea, blood clots such as DVT, common bile duct injury, anesthesia risks, and possible need for additional procedures. The likelihood of improvement in symptoms and return to the patient's normal status is good. We discussed the typical post-operative recovery course.  Patient is at increased risk for complications of surgery including but not limited to bleeding, infection, and conversion to open procedure. He has several trips planned out-of-town in the near future. He would like to plan surgery for when he returns. I feel will be safe to do that as his symptoms have significantly improved with diet Management. He is going to check his schedule with his wife and call back and we will plan surgery in late June or early July. He has agreed to call if  symptoms return in the interim.

## 2012-12-31 NOTE — Anesthesia Postprocedure Evaluation (Signed)
  Anesthesia Post-op Note  Patient: Billy [name]  Procedure(s) Performed: Procedure(s): LAPAROSCOPIC CHOLECYSTECTOMY  Patient Location: PACU  Anesthesia Type:General  Level of Consciousness: awake  Airway and Oxygen Therapy: Patient Spontanous Breathing  Post-op Pain: mild  Post-op Assessment: Post-op Vital signs reviewed, Patient's Cardiovascular Status Stable, Respiratory Function Stable, Patent Airway, No signs of Nausea or vomiting and Pain level controlled  Post-op Vital Signs: stable  Complications: No apparent anesthesia complications

## 2012-12-31 NOTE — Anesthesia Preprocedure Evaluation (Signed)
Anesthesia Evaluation  Patient identified by MRN, date of birth, ID band Patient awake    Reviewed: Allergy & Precautions, H&P , NPO status , Patient's Chart, lab work & pertinent test results  Airway Mallampati: I TM Distance: >3 FB Neck ROM: Full    Dental   Pulmonary  breath sounds clear to auscultation        Cardiovascular hypertension, Rhythm:Regular Rate:Normal     Neuro/Psych Depression    GI/Hepatic PUD, GERD-  ,  Endo/Other    Renal/GU      Musculoskeletal   Abdominal   Peds  Hematology   Anesthesia Other Findings   Reproductive/Obstetrics                           Anesthesia Physical Anesthesia Plan  ASA: II  Anesthesia Plan: General   Post-op Pain Management:    Induction: Intravenous  Airway Management Planned: Oral ETT  Additional Equipment:   Intra-op Plan:   Post-operative Plan: Extubation in OR  Informed Consent: I have reviewed the patients History and Physical, chart, labs and discussed the procedure including the risks, benefits and alternatives for the proposed anesthesia with the patient or authorized representative who has indicated his/her understanding and acceptance.     Plan Discussed with: CRNA and Surgeon  Anesthesia Plan Comments:         Anesthesia Quick Evaluation

## 2013-01-01 ENCOUNTER — Encounter (HOSPITAL_COMMUNITY): Payer: Self-pay | Admitting: General Surgery

## 2013-01-09 ENCOUNTER — Other Ambulatory Visit: Payer: Self-pay | Admitting: Internal Medicine

## 2013-01-19 ENCOUNTER — Other Ambulatory Visit: Payer: Self-pay | Admitting: Internal Medicine

## 2013-01-19 NOTE — Telephone Encounter (Signed)
Let him know it is too soon since he should not have been taking the hydrocodone while on the oxycodone Should have enough for another 1-2 weeks at least If not, we will have to reconsider his prescription since that violates our controlled substance agreement

## 2013-01-19 NOTE — Telephone Encounter (Signed)
Spoke with patient and he states he only took the oxycodone for 2 days and then a family member got in them and he flushed the oxycodone down the toilet. Per pt he has about 2-3 days of hydrocodone left. Please advise

## 2013-01-19 NOTE — Telephone Encounter (Signed)
Pt also just got refill of Oxycodone 5mg  12/31/2012 from Dr. Violeta Gelinas, also looks like he had surgery?

## 2013-01-20 ENCOUNTER — Ambulatory Visit (INDEPENDENT_AMBULATORY_CARE_PROVIDER_SITE_OTHER): Payer: 59 | Admitting: General Surgery

## 2013-01-20 ENCOUNTER — Encounter (INDEPENDENT_AMBULATORY_CARE_PROVIDER_SITE_OTHER): Payer: Self-pay | Admitting: General Surgery

## 2013-01-20 ENCOUNTER — Encounter: Payer: Self-pay | Admitting: *Deleted

## 2013-01-20 ENCOUNTER — Encounter: Payer: Self-pay | Admitting: Internal Medicine

## 2013-01-20 VITALS — BP 142/78 | HR 78 | Temp 97.7°F | Resp 18 | Ht 75.0 in | Wt 203.0 lb

## 2013-01-20 DIAGNOSIS — Z9049 Acquired absence of other specified parts of digestive tract: Secondary | ICD-10-CM

## 2013-01-20 DIAGNOSIS — Z9889 Other specified postprocedural states: Secondary | ICD-10-CM

## 2013-01-20 NOTE — Telephone Encounter (Signed)
I will print the prescription this time and he must sign the controlled substance agreement before getting. He needs to know the concrete rules---- we should have been informed he got meds from someone else

## 2013-01-20 NOTE — Telephone Encounter (Signed)
Spoke with patient and advised results, he understands. Spoke with patient and advised rx ready for pick-up and it will be at the front desk, after he see Georgiann Hahn

## 2013-01-20 NOTE — Progress Notes (Signed)
Subjective:     Patient ID: Billy [name], male   DOB: 1946-06-26, 67 y.o.   MRN: [id number]  HPI Patient status post Laparoscopic cholecystectomy. He is doing very well. He has resumed his walking though it is somewhat slower pace. He is having no more significant abdominal pain.   Review of Systems     Objective:   Physical Exam Abdomen soft and nontender. Residual Dermabond and small portion of suture was removed from his epigastric wound. All wounds are well-healed with no signs of infection.    Assessment:     Billy [name] is status post laparoscopic cholecystectomy    Plan:     Return as needed. Avoid heavy lifting for a total of 4 weeks after surgery.

## 2013-02-01 ENCOUNTER — Other Ambulatory Visit: Payer: Self-pay

## 2013-02-01 MED ORDER — NADOLOL 40 MG PO TABS: 40.0000 mg | ORAL_TABLET | Freq: Every day | ORAL | Status: DC

## 2013-02-01 NOTE — Telephone Encounter (Signed)
90 day supply sent in as requested, 11 RF's had been approved back in May 2014 by Dr. Leone Payor.

## 2013-02-03 ENCOUNTER — Encounter: Payer: Self-pay | Admitting: Internal Medicine

## 2013-02-18 ENCOUNTER — Other Ambulatory Visit: Payer: Self-pay | Admitting: Internal Medicine

## 2013-02-18 NOTE — Telephone Encounter (Signed)
Last filled 01/19/13, LETVAK PATIENT, Please send back to me for call in

## 2013-02-19 ENCOUNTER — Other Ambulatory Visit: Payer: Self-pay | Admitting: Internal Medicine

## 2013-02-19 NOTE — Telephone Encounter (Signed)
Please call in

## 2013-02-19 NOTE — Telephone Encounter (Signed)
rx called in

## 2013-03-22 ENCOUNTER — Other Ambulatory Visit: Payer: Self-pay | Admitting: Family Medicine

## 2013-03-22 NOTE — Telephone Encounter (Signed)
Electronic refill request.  Please advise. 

## 2013-03-22 NOTE — Telephone Encounter (Signed)
Pt left v/m checking on status of Hydrocodone apap refill request from [facility]. Pt request cb when refilled.

## 2013-03-23 ENCOUNTER — Other Ambulatory Visit: Payer: Self-pay | Admitting: *Deleted

## 2013-03-23 MED ORDER — HYDROCODONE-ACETAMINOPHEN 5-325 MG PO TABS: ORAL_TABLET | ORAL | Status: DC

## 2013-03-23 NOTE — Telephone Encounter (Signed)
Faxed refill request. Please advise.  

## 2013-03-23 NOTE — Telephone Encounter (Signed)
Pt checking on status of refill. Medication phoned to [facility] pharmacy as instructed.pt notified done.

## 2013-03-23 NOTE — Telephone Encounter (Signed)
Okay #90 x 0 

## 2013-04-08 ENCOUNTER — Telehealth: Payer: Self-pay | Admitting: Internal Medicine

## 2013-04-08 NOTE — Telephone Encounter (Signed)
Mr. Billy [name] dropped off a Biometric Screening Form to be completed by Dr. Alphonsus Sias. It needs to be faxed to the number at the bottom of the form---[phone]. The best number for Billy [name] is [phone].

## 2013-04-09 NOTE — Telephone Encounter (Signed)
Please fill out the form.

## 2013-04-09 NOTE — Telephone Encounter (Signed)
It looks like he needs lipid done? Please advise Form on your desk

## 2013-04-13 NOTE — Telephone Encounter (Signed)
Scheduled pt for lipid and glucose per verbal from [facility]

## 2013-04-20 ENCOUNTER — Other Ambulatory Visit: Payer: Self-pay | Admitting: Family Medicine

## 2013-04-20 ENCOUNTER — Other Ambulatory Visit (INDEPENDENT_AMBULATORY_CARE_PROVIDER_SITE_OTHER): Payer: 59

## 2013-04-20 DIAGNOSIS — Z131 Encounter for screening for diabetes mellitus: Secondary | ICD-10-CM

## 2013-04-20 DIAGNOSIS — E7800 Pure hypercholesterolemia, unspecified: Secondary | ICD-10-CM

## 2013-04-20 LAB — LIPID PANEL
Cholesterol: 198 mg/dL (ref 0–200)
LDL Cholesterol: 127 mg/dL — ABNORMAL HIGH (ref 0–99)
Triglycerides: 120 mg/dL (ref 0.0–149.0)

## 2013-04-20 NOTE — Telephone Encounter (Signed)
Pt requesting Hydrocodone-acetaminophen refill.  Last filled 03/22/13.

## 2013-04-21 ENCOUNTER — Encounter: Payer: Self-pay | Admitting: *Deleted

## 2013-04-21 MED ORDER — HYDROCODONE-ACETAMINOPHEN 5-325 MG PO TABS: ORAL_TABLET | ORAL | Status: DC

## 2013-04-21 NOTE — Telephone Encounter (Signed)
Spoke with patient and advised rx ready for pick-up and it will be at the front desk.  

## 2013-05-20 ENCOUNTER — Other Ambulatory Visit: Payer: Self-pay

## 2013-05-20 MED ORDER — HYDROCODONE-ACETAMINOPHEN 5-325 MG PO TABS: ORAL_TABLET | ORAL | Status: DC

## 2013-05-20 NOTE — Telephone Encounter (Signed)
Pt left v/m requesting rx hydrocodone apap. Wants to pick up on 05/21/13. Call when ready for pick up.

## 2013-05-21 NOTE — Telephone Encounter (Signed)
Spoke with patient and advised rx ready for pick-up and it will be at the front desk.  

## 2013-06-17 ENCOUNTER — Other Ambulatory Visit: Payer: Self-pay

## 2013-06-17 NOTE — Telephone Encounter (Signed)
Pt left v/m requesting rx hydrocodone apap;pt will run out over weekend and request to pick up on 06/18/13. Call pt when ready for pick up.

## 2013-06-18 MED ORDER — HYDROCODONE-ACETAMINOPHEN 5-325 MG PO TABS: ORAL_TABLET | ORAL | Status: DC

## 2013-06-18 NOTE — Telephone Encounter (Signed)
Left vm informing pt RX ready to be picked up.

## 2013-07-19 ENCOUNTER — Other Ambulatory Visit: Payer: Self-pay | Admitting: Internal Medicine

## 2013-07-19 ENCOUNTER — Other Ambulatory Visit: Payer: Self-pay

## 2013-07-19 MED ORDER — HYDROCODONE-ACETAMINOPHEN 5-325 MG PO TABS: ORAL_TABLET | ORAL | Status: DC

## 2013-07-19 NOTE — Telephone Encounter (Signed)
Spoke with patient and advised rx ready for pick-up and it will be at the front desk.  

## 2013-07-19 NOTE — Telephone Encounter (Signed)
Pt request rx hydrocodone apap. Call when ready for pick up. 

## 2013-08-17 ENCOUNTER — Other Ambulatory Visit: Payer: Self-pay | Admitting: *Deleted

## 2013-08-17 MED ORDER — HYDROCODONE-ACETAMINOPHEN 5-325 MG PO TABS: ORAL_TABLET | ORAL | Status: DC

## 2013-08-17 NOTE — Telephone Encounter (Signed)
Patient called requesting a refill on Hydrocodone, last refill 07/19/13 #90 and last office visit 09/16/12. Is it okay to refill medication?

## 2013-08-17 NOTE — Telephone Encounter (Signed)
Spoke to pt and informed him Rx is available for pickup at the front desk;informed a gov't issued photo id required for pickup

## 2013-09-02 ENCOUNTER — Ambulatory Visit (INDEPENDENT_AMBULATORY_CARE_PROVIDER_SITE_OTHER)
Admission: RE | Admit: 2013-09-02 | Discharge: 2013-09-02 | Disposition: A | Discharge status: Home / Self Care | Payer: 59 | Source: Ambulatory Visit | Attending: Internal Medicine | Admitting: Internal Medicine

## 2013-09-02 ENCOUNTER — Ambulatory Visit (INDEPENDENT_AMBULATORY_CARE_PROVIDER_SITE_OTHER): Payer: 59 | Admitting: Internal Medicine

## 2013-09-02 ENCOUNTER — Encounter: Payer: Self-pay | Admitting: Internal Medicine

## 2013-09-02 VITALS — BP 150/80 | HR 75 | Temp 98.4°F | Resp 20 | Wt 209.0 lb

## 2013-09-02 DIAGNOSIS — R062 Wheezing: Secondary | ICD-10-CM

## 2013-09-02 DIAGNOSIS — J209 Acute bronchitis, unspecified: Secondary | ICD-10-CM

## 2013-09-02 MED ORDER — AZITHROMYCIN 250 MG PO TABS: ORAL_TABLET | ORAL | Status: DC

## 2013-09-02 MED ORDER — PREDNISONE 20 MG PO TABS: 40.0000 mg | ORAL_TABLET | Freq: Every day | ORAL | Status: DC

## 2013-09-02 NOTE — Progress Notes (Signed)
Subjective:    Patient ID: Billy [name], male    DOB: September 08, 1945, 68 y.o.   MRN: [id number]  HPI Got cold from wife He got it but he feels it is in his chest Trouble breathing if he exerts himself at all  Started 4 days ago or so Has worsened through this time The hydrocodone seems to help---less pain with cough. Then he is able to cough up white/off white mucus No blood No fever-- highest 99.5 No sweats --some chills Has noticed some wheezes at night   Tried robitussin DM--then decided to stop in case he had pneumonia Tried zinc oxide also--stopped  Current Outpatient Prescriptions on File Prior to Visit  Medication Sig Dispense Refill  . Cholecalciferol (VITAMIN D3) 2000 UNITS TABS Take 2,000 Units by mouth daily.      . Coenzyme Q10 (CO Q 10 PO) Take 10 mLs by mouth every evening.      Marland Kitchen HYDROcodone-acetaminophen (NORCO/VICODIN) 5-325 MG per tablet TAKE 1 TABLET BY MOUTH 3 TIMES A DAY AS NEEDED  90 tablet  0  . KRILL OIL PO Take 1 capsule by mouth every evening.      . mirtazapine (REMERON) 45 MG tablet TAKE ONE BY MOUTH AT BEDTIME  90 tablet  3  . Multiple Vitamin (MULTIVITAMIN WITH MINERALS) TABS Take 1 tablet by mouth daily.      . nadolol (CORGARD) 40 MG tablet Take 1 tablet (40 mg total) by mouth daily.  90 tablet  2  . omeprazole (PRILOSEC) 40 MG capsule TAKE 1 CAPSULE (40 MG TOTAL) BY MOUTH DAILY.  90 capsule  2  . psyllium (METAMUCIL) 58.6 % powder Take 1 packet by mouth 3 (three) times daily.       No current facility-administered medications on file prior to visit.    Allergies  Allergen Reactions  . Nsaids Other (See Comments)    Stomach pain  . Other     Blood thinners reaction unknown    Past Medical History  Diagnosis Date  . Depression     mild at most  . Diverticulosis of colon   . Sleep disturbance     takes Remeron nightly  . DDD (degenerative disc disease)   . Erectile dysfunction   . GI hemorrhage 2006, 2013    esophagitis and 1+  varices, melena in 2013   . Alcoholism   . Acute posthemorrhagic anemia   . Thrombocytopenia, unspecified   . Personal history of colonic polyps   . 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 Basal cell carcinoma   . Gastric ulcer 08/2011    small, antral ulcer  . Angiodysplasia of colon 09/11/2011    Multiple in the right colon, ablated with APC. Question if this is portal colopathy.   . 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 Campylobacter diarrhea 01/07/2012  . Alcoholic cirrhosis of liver 09/24/2011    Years of alcohol use. Progressed from fatty liver to cirrhosis based upon review imaging. Manifestations are portal hypertension with esophageal varices, portal gastropathy, rectal varices and he may have portal colopathy. Ascites also present. Naive to HAV and HBV Claims he has had pneumococcal vaccine   . Hypertension     takes Nadolol daily  . History of bronchitis     >87yrs ago  . GERD (gastroesophageal reflux disease)     takes Omeprazole prn  . Hydrocele   . History of blood transfusion  as a child and no abnormal reaction noted  . Back pain     Past Surgical History  Procedure Laterality Date  . Varicocele repair  1980's  . Knee arthroscopy  2008    Right  . Knee arthroscopy  2008    Left  . Colonoscopy  2001, 200411/02/2006  . Esophagogastroduodenoscopy  2006  . Esophagogastroduodenoscopy  09/11/2011    Procedure: ESOPHAGOGASTRODUODENOSCOPY (EGD);  Surgeon: Gatha Mayer, MD;  Location: [facility];  Service: Endoscopy;  Laterality: N/A;  . Colonoscopy  09/11/2011    Procedure: COLONOSCOPY;  Surgeon: Gatha Mayer, MD;  Location: [facility];  Service: Endoscopy;  Laterality: N/A;  . Cataract extraction  2013    bilateral with IOL  . [place] thora/paracentesis  10/28/2011  . Eye surgery    . Flexible sigmoidoscopy  01/07/2012    Procedure: FLEXIBLE SIGMOIDOSCOPY;  Surgeon: Lafayette Dragon, MD;  Location: [facility];   Service: Endoscopy;  Laterality: N/A;  . Cholecystectomy  12/31/2012    Procedure: LAPAROSCOPIC CHOLECYSTECTOMY;  Surgeon: Zenovia Jarred, MD;  Location: [facility] OR;  Service: General;;    Family History  Problem Relation Age of Onset  . Stroke Mother   . Diabetes Mother   . Colon cancer Neg Hx     History   Social History  . Marital Status: Married    Spouse Name: N/A    Number of Children: 1  . Years of Education: N/A   Occupational History  . Retired     mostly from [employer]   Social History Main Topics  . Smoking status: Former Smoker -- 0.50 packs/day    Types: Cigarettes  . Smokeless tobacco: Never Used     Comment: quit a couple of months ago  . Alcohol Use: No  . Drug Use: No  . Sexual Activity: No   Other Topics Concern  . Not on file   Social History Narrative   Married- 2nd   1 son (adopted) from previous marriage         Review of Systems No rash No vomiting or diarrhea Appetite is off    Objective:   Physical Exam  Constitutional: He appears well-developed and well-nourished. No distress.  HENT:  Mouth/Throat: Oropharynx is clear and moist. No oropharyngeal exudate.  No sinus tenderness TMs normal Mild nasal inflammation  Neck: Normal range of motion. Neck supple.  Pulmonary/Chest: Effort normal. No respiratory distress. He has wheezes. He has no rales.  Mild expiratory phase prolongation Mild exp wheezes  Lymphadenopathy:    He has no cervical adenopathy.          Assessment & Plan:

## 2013-09-02 NOTE — Assessment & Plan Note (Signed)
May be viral but has been wheezing and dyspneic CXR doesn't show pneumonia --but some hyperinflation Will treat with azithromycin just in case

## 2013-09-02 NOTE — Progress Notes (Signed)
Pre visit review using our clinic review tool, if applicable. No additional management support is needed unless otherwise documented below in the visit note. 

## 2013-09-02 NOTE — Assessment & Plan Note (Signed)
Had restarted smoking but stopped again in January Counseled on this Will give brief course of prednisone

## 2013-09-15 ENCOUNTER — Other Ambulatory Visit: Payer: Self-pay

## 2013-09-15 MED ORDER — HYDROCODONE-ACETAMINOPHEN 5-325 MG PO TABS: ORAL_TABLET | ORAL | Status: DC

## 2013-09-15 NOTE — Telephone Encounter (Signed)
Pt left v/m requesting rx hydrocodone apap. Call when ready for pick up.  

## 2013-09-16 NOTE — Telephone Encounter (Signed)
Spoke with patient and advised rx ready for pick-up and it will be at the front desk.  

## 2013-09-17 ENCOUNTER — Encounter: Payer: Self-pay | Admitting: Internal Medicine

## 2013-09-17 ENCOUNTER — Ambulatory Visit (INDEPENDENT_AMBULATORY_CARE_PROVIDER_SITE_OTHER): Payer: 59 | Admitting: Internal Medicine

## 2013-09-17 VITALS — BP 128/70 | HR 68 | Temp 97.8°F | Ht 75.0 in | Wt 209.0 lb

## 2013-09-17 DIAGNOSIS — F3289 Other specified depressive episodes: Secondary | ICD-10-CM

## 2013-09-17 DIAGNOSIS — Z0000 Encounter for general adult medical examination without abnormal findings: Secondary | ICD-10-CM

## 2013-09-17 DIAGNOSIS — Z125 Encounter for screening for malignant neoplasm of prostate: Secondary | ICD-10-CM

## 2013-09-17 DIAGNOSIS — K7030 Alcoholic cirrhosis of liver without ascites: Secondary | ICD-10-CM

## 2013-09-17 DIAGNOSIS — M5137 Other intervertebral disc degeneration, lumbosacral region: Secondary | ICD-10-CM

## 2013-09-17 DIAGNOSIS — F329 Major depressive disorder, single episode, unspecified: Secondary | ICD-10-CM

## 2013-09-17 LAB — TSH: TSH: 2.86 u[IU]/mL (ref 0.35–5.50)

## 2013-09-17 LAB — COMPREHENSIVE METABOLIC PANEL
ALK PHOS: 84 U/L (ref 39–117)
ALT: 25 U/L (ref 0–53)
AST: 34 U/L (ref 0–37)
Albumin: 3.6 g/dL (ref 3.5–5.2)
BUN: 10 mg/dL (ref 6–23)
CALCIUM: 9.5 mg/dL (ref 8.4–10.5)
CHLORIDE: 101 meq/L (ref 96–112)
CO2: 29 mEq/L (ref 19–32)
CREATININE: 0.8 mg/dL (ref 0.4–1.5)
GFR: 100.06 mL/min (ref >60.00)
Glucose, Bld: 126 mg/dL — ABNORMAL HIGH (ref 70–99)
Potassium: 4.8 mEq/L (ref 3.5–5.1)
Sodium: 139 mEq/L (ref 135–145)
Total Bilirubin: 1.5 mg/dL — ABNORMAL HIGH (ref 0.3–1.2)
Total Protein: 7 g/dL (ref 6.0–8.3)

## 2013-09-17 LAB — CBC WITH DIFFERENTIAL/PLATELET
BASOS PCT: 0.5 % (ref 0.0–3.0)
Basophils Absolute: 0 10*3/uL (ref 0.0–0.1)
EOS ABS: 0.2 10*3/uL (ref 0.0–0.7)
Eosinophils Relative: 2.1 % (ref 0.0–5.0)
HCT: 44.1 % (ref 39.0–52.0)
HEMOGLOBIN: 14.5 g/dL (ref 13.0–17.0)
LYMPHS PCT: 18.6 % (ref 12.0–46.0)
Lymphs Abs: 1.6 10*3/uL (ref 0.7–4.0)
MCHC: 32.8 g/dL (ref 30.0–36.0)
MCV: 97.3 fl (ref 78.0–100.0)
MONOS PCT: 6.3 % (ref 3.0–12.0)
Monocytes Absolute: 0.5 10*3/uL (ref 0.1–1.0)
NEUTROS ABS: 6.2 10*3/uL (ref 1.4–7.7)
Neutrophils Relative %: 72.5 % (ref 43.0–77.0)
Platelets: 167 10*3/uL (ref 150.0–400.0)
RBC: 4.53 Mil/uL (ref 4.22–5.81)
RDW: 15.1 % — ABNORMAL HIGH (ref 11.5–14.6)
WBC: 8.6 10*3/uL (ref 4.5–10.5)

## 2013-09-17 LAB — PSA: PSA: 0.8 ng/mL (ref 0.10–4.00)

## 2013-09-17 LAB — T4, FREE: Free T4: 0.86 ng/dL (ref 0.60–1.60)

## 2013-09-17 NOTE — Assessment & Plan Note (Signed)
Doing well Will check PSA

## 2013-09-17 NOTE — Assessment & Plan Note (Signed)
Controlled and sleeps fairly well with the mirtazapine

## 2013-09-17 NOTE — Patient Instructions (Signed)
Exercise to Lose Weight Exercise and a healthy diet may help you lose weight. Your doctor may suggest specific exercises. EXERCISE IDEAS AND TIPS  Choose low-cost things you enjoy doing, such as walking, bicycling, or exercising to workout videos.  Take stairs instead of the elevator.  Walk during your lunch break.  Park your car further away from work or school.  Go to a gym or an exercise class.  Start with 5 to 10 minutes of exercise each day. Build up to 30 minutes of exercise 4 to 6 days a week.  Wear shoes with good support and comfortable clothes.  Stretch before and after working out.  Work out until you breathe harder and your heart beats faster.  Drink extra water when you exercise.  Do not do so much that you hurt yourself, feel dizzy, or get very short of breath. Exercises that burn about 150 calories:  Running 1  miles in 15 minutes.  Playing volleyball for 45 to 60 minutes.  Washing and waxing a car for 45 to 60 minutes.  Playing touch football for 45 minutes.  Walking 1  miles in 35 minutes.  Pushing a stroller 1  miles in 30 minutes.  Playing basketball for 30 minutes.  Raking leaves for 30 minutes.  Bicycling 5 miles in 30 minutes.  Walking 2 miles in 30 minutes.  Dancing for 30 minutes.  Shoveling snow for 15 minutes.  Swimming laps for 20 minutes.  Walking up stairs for 15 minutes.  Bicycling 4 miles in 15 minutes.  Gardening for 30 to 45 minutes.  Jumping rope for 15 minutes.  Washing windows or floors for 45 to 60 minutes. Document Released: 08/03/2010 Document Revised: 09/23/2011 Document Reviewed: 08/03/2010 [facility] Patient Information 2014 [place], [place]. DASH Diet The DASH diet stands for "Dietary Approaches to Stop Hypertension." It is a healthy eating plan that has been shown to reduce high blood pressure (hypertension) in as little as 14 days, while also possibly providing other significant health benefits. These other  health benefits include reducing the risk of breast cancer after menopause and reducing the risk of type 2 diabetes, heart disease, colon cancer, and stroke. Health benefits also include weight loss and slowing kidney failure in patients with chronic kidney disease.  DIET GUIDELINES  Limit salt (sodium). Your diet should contain less than 1500 mg of sodium daily.  Limit refined or processed carbohydrates. Your diet should include mostly whole grains. Desserts and added sugars should be used sparingly.  Include small amounts of heart-healthy fats. These types of fats include nuts, oils, and tub margarine. Limit saturated and trans fats. These fats have been shown to be harmful in the body. CHOOSING FOODS  The following food groups are based on a 2000 calorie diet. See your Registered Dietitian for individual calorie needs. Grains and Grain Products (6 to 8 servings daily)  Eat More Often: Whole-wheat bread, brown rice, whole-grain or wheat pasta, quinoa, popcorn without added fat or salt (air popped).  Eat Less Often: White bread, white pasta, white rice, cornbread. Vegetables (4 to 5 servings daily)  Eat More Often: Fresh, frozen, and canned vegetables. Vegetables may be raw, steamed, roasted, or grilled with a minimal amount of fat.  Eat Less Often/Avoid: Creamed or fried vegetables. Vegetables in a cheese sauce. Fruit (4 to 5 servings daily)  Eat More Often: All fresh, canned (in natural juice), or frozen fruits. Dried fruits without added sugar. One hundred percent fruit juice ( cup [237 mL] daily).  Eat Less Often: Dried fruits with added sugar. Canned fruit in light or heavy syrup. [employer], Fish, and Poultry (2 servings or less daily. One serving is 3 to 4 oz [85-114 g]).  Eat More Often: Ninety percent or leaner ground beef, tenderloin, sirloin. Round cuts of beef, chicken breast, [place] breast. All fish. Grill, bake, or broil your meat. Nothing should be fried.  Eat Less  Often/Avoid: Fatty cuts of meat, [place], or chicken leg, thigh, or wing. Fried cuts of meat or fish. Dairy (2 to 3 servings)  Eat More Often: Low-fat or fat-free milk, low-fat plain or light yogurt, reduced-fat or part-skim cheese.  Eat Less Often/Avoid: Milk (whole, 2%).Whole milk yogurt. Full-fat cheeses. Nuts, Seeds, and Legumes (4 to 5 servings per week)  Eat More Often: All without added salt.  Eat Less Often/Avoid: Salted nuts and seeds, canned beans with added salt. Fats and Sweets (limited)  Eat More Often: Vegetable oils, tub margarines without trans fats, sugar-free gelatin. Mayonnaise and salad dressings.  Eat Less Often/Avoid: Coconut oils, palm oils, butter, stick margarine, cream, half and half, cookies, candy, pie. FOR MORE INFORMATION The Dash Diet Eating Plan: www.dashdiet.org Document Released: 06/20/2011 Document Revised: 09/23/2011 Document Reviewed: 06/20/2011 [facility] Patient Information 2014 [place], [place].

## 2013-09-17 NOTE — Progress Notes (Signed)
Pre visit review using our clinic review tool, if applicable. No additional management support is needed unless otherwise documented below in the visit note. 

## 2013-09-17 NOTE — Progress Notes (Signed)
Subjective:    Patient ID: Billy [name], male    DOB: 01-05-1946, 68 y.o.   MRN: [id number]  HPI Here for physical Doing well Did get over respiratory illness Reviewed advanced directives Back off the cigarettes Remains abstinent with alcohol  Has noted BP up at times-- 140-150/70-80 Discussed guidelines  Mood is good Sleeps well with 1/2 of the mirtazapine  Current Outpatient Prescriptions on File Prior to Visit  Medication Sig Dispense Refill  . Cholecalciferol (VITAMIN D3) 2000 UNITS TABS Take 2,000 Units by mouth daily.      . Coenzyme Q10 (CO Q 10 PO) Take 10 mLs by mouth every evening.      Marland Kitchen HYDROcodone-acetaminophen (NORCO/VICODIN) 5-325 MG per tablet TAKE 1 TABLET BY MOUTH 3 TIMES A DAY AS NEEDED  90 tablet  0  . KRILL OIL PO Take 1 capsule by mouth every evening.      . Multiple Vitamin (MULTIVITAMIN WITH MINERALS) TABS Take 1 tablet by mouth daily.      . nadolol (CORGARD) 40 MG tablet Take 1 tablet (40 mg total) by mouth daily.  90 tablet  2  . omeprazole (PRILOSEC) 40 MG capsule TAKE 1 CAPSULE (40 MG TOTAL) BY MOUTH DAILY.  90 capsule  2  . psyllium (METAMUCIL) 58.6 % powder Take 1 packet by mouth 3 (three) times daily.       No current facility-administered medications on file prior to visit.    Allergies  Allergen Reactions  . Nsaids Other (See Comments)    Stomach pain  . Other     Blood thinners reaction unknown    Past Medical History  Diagnosis Date  . Depression     mild at most  . Diverticulosis of colon   . Sleep disturbance     takes Remeron nightly  . DDD (degenerative disc disease)   . Erectile dysfunction   . GI hemorrhage 2006, 2013    esophagitis and 1+ varices, melena in 2013   . Alcoholism   . Acute posthemorrhagic anemia   . Thrombocytopenia, unspecified   . Personal history of colonic polyps   . 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 Basal cell carcinoma   .  Gastric ulcer 08/2011    small, antral ulcer  . Angiodysplasia of colon 09/11/2011    Multiple in the right colon, ablated with APC. Question if this is portal colopathy.   . 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 Campylobacter diarrhea 01/07/2012  . Alcoholic cirrhosis of liver 09/24/2011    Years of alcohol use. Progressed from fatty liver to cirrhosis based upon review imaging. Manifestations are portal hypertension with esophageal varices, portal gastropathy, rectal varices and he may have portal colopathy. Ascites also present. Naive to HAV and HBV Claims he has had pneumococcal vaccine   . Hypertension     takes Nadolol daily  . History of bronchitis     >20yrs ago  . GERD (gastroesophageal reflux disease)     takes Omeprazole prn  . Hydrocele   . History of blood transfusion     as a child and no abnormal reaction noted  . Back pain     Past Surgical History  Procedure Laterality Date  . Varicocele repair  1980's  . Knee arthroscopy  2008    Right  . Knee arthroscopy  2008    Left  . Colonoscopy  2001, 200411/02/2006  . Esophagogastroduodenoscopy  2006  .  Esophagogastroduodenoscopy  09/11/2011    Procedure: ESOPHAGOGASTRODUODENOSCOPY (EGD);  Surgeon: Gatha Mayer, MD;  Location: [facility];  Service: Endoscopy;  Laterality: N/A;  . Colonoscopy  09/11/2011    Procedure: COLONOSCOPY;  Surgeon: Gatha Mayer, MD;  Location: [facility];  Service: Endoscopy;  Laterality: N/A;  . Cataract extraction  2013    bilateral with IOL  . [place] thora/paracentesis  10/28/2011  . Eye surgery    . Flexible sigmoidoscopy  01/07/2012    Procedure: FLEXIBLE SIGMOIDOSCOPY;  Surgeon: Lafayette Dragon, MD;  Location: [facility];  Service: Endoscopy;  Laterality: N/A;  . Cholecystectomy  12/31/2012    Procedure: LAPAROSCOPIC CHOLECYSTECTOMY;  Surgeon: Zenovia Jarred, MD;  Location: [facility] OR;  Service: General;;    Family History  Problem Relation Age of Onset  . Stroke Mother   . Diabetes Mother   . Colon  cancer Neg Hx     History   Social History  . Marital Status: Married    Spouse Name: N/A    Number of Children: 1  . Years of Education: N/A   Occupational History  . Retired     mostly from [employer]   Social History Main Topics  . Smoking status: Former Smoker -- 0.50 packs/day    Types: Cigarettes  . Smokeless tobacco: Never Used     Comment: quit a couple of months ago  . Alcohol Use: No  . Drug Use: No  . Sexual Activity: No   Other Topics Concern  . Not on file   Social History Narrative   Married- 2nd   1 son (adopted) from previous marriage      No living will   Wife should be health care POA   Would accept attempts at resuscitation   No tube feeds if cognitively unaware         Review of Systems  Constitutional: Negative for fatigue and unexpected weight change.       Wears seat belt  HENT: Negative for dental problem, hearing loss and tinnitus.        Regular with dentist  Eyes: Negative for visual disturbance.       No diplopia or unilateral vision loss  Respiratory: Negative for cough, chest tightness and shortness of breath.   Cardiovascular: Negative for chest pain, palpitations and leg swelling.  Gastrointestinal: Negative for nausea, vomiting, abdominal pain, constipation and blood in stool.       No heartburn  Endocrine: Negative for cold intolerance and heat intolerance.  Genitourinary: Negative for urgency, frequency and difficulty urinating.       Some ED--- still has hydrocele  Musculoskeletal: Positive for back pain. Negative for arthralgias and joint swelling.       Uses the hydrocodone tid---allows him to stay active and go to gym  Skin: Negative for rash.       No suspicious lesions Sees Dr Allyson Sabal yearly  Allergic/Immunologic: Negative for environmental allergies and immunocompromised state.  Neurological: Negative for dizziness, syncope, weakness, light-headedness, numbness and headaches.  Hematological: Negative for adenopathy. Does  not bruise/bleed easily.  Psychiatric/Behavioral: Positive for sleep disturbance. The patient is not nervous/anxious.        Occasional bad night---usually sleeps okay       Objective:   Physical Exam  Constitutional: He is oriented to person, place, and time. He appears well-developed and well-nourished. No distress.  HENT:  Head: Normocephalic and atraumatic.  Right Ear: External ear normal.  Left Ear: External ear normal.  Mouth/Throat: Oropharynx is clear and moist. No oropharyngeal exudate.  Eyes: Conjunctivae and EOM are normal. Pupils are equal, round, and reactive to light.  Neck: Normal range of motion. Neck supple. No thyromegaly present.  Cardiovascular: Normal rate, regular rhythm, normal heart sounds and intact distal pulses.  Exam reveals no gallop.   No murmur heard. Pulmonary/Chest: Effort normal and breath sounds normal. No respiratory distress. He has no wheezes. He has no rales.  Abdominal: Soft. There is no tenderness.  Musculoskeletal: He exhibits no edema and no tenderness.  Lymphadenopathy:    He has no cervical adenopathy.  Neurological: He is alert and oriented to person, place, and time.  Skin: No rash noted. No erythema.  Psychiatric: He has a normal mood and affect. His behavior is normal.          Assessment & Plan:

## 2013-09-17 NOTE — Assessment & Plan Note (Signed)
Seems to be in remission Will check labs No signs of liver failure now

## 2013-09-17 NOTE — Assessment & Plan Note (Signed)
Does well with the tid norco Discussed regular exercise

## 2013-10-14 ENCOUNTER — Other Ambulatory Visit: Payer: Self-pay

## 2013-10-14 MED ORDER — HYDROCODONE-ACETAMINOPHEN 5-325 MG PO TABS: ORAL_TABLET | ORAL | Status: DC

## 2013-10-14 NOTE — Telephone Encounter (Signed)
Pt left v/m requesting rx hydrocodone apap. Call when ready for pick up.  

## 2013-10-14 NOTE — Telephone Encounter (Signed)
Spoke with patient and advised rx ready for pick-up and it will be at the front desk.  

## 2013-10-22 ENCOUNTER — Other Ambulatory Visit: Payer: Self-pay | Admitting: Internal Medicine

## 2013-11-06 ENCOUNTER — Other Ambulatory Visit: Payer: Self-pay | Admitting: Internal Medicine

## 2013-11-15 ENCOUNTER — Other Ambulatory Visit: Payer: Self-pay

## 2013-11-15 MED ORDER — HYDROCODONE-ACETAMINOPHEN 5-325 MG PO TABS: ORAL_TABLET | ORAL | Status: DC

## 2013-11-15 NOTE — Telephone Encounter (Signed)
Pt left vm requesting status of hydrocodone apap. Call when ready for pick up.

## 2013-11-15 NOTE — Telephone Encounter (Signed)
Spoke with patient and advised rx ready for pick-up and it will be at the front desk.  

## 2013-11-15 NOTE — Telephone Encounter (Signed)
Pt left v/m requesting rx hydrocodone apap. Call when ready for pick up.  

## 2013-12-15 ENCOUNTER — Other Ambulatory Visit: Payer: Self-pay

## 2013-12-15 NOTE — Telephone Encounter (Signed)
Pt left v/m requesting rx hydrocodone apap. Call when ready for pick up.  

## 2013-12-16 MED ORDER — HYDROCODONE-ACETAMINOPHEN 5-325 MG PO TABS: ORAL_TABLET | ORAL | Status: DC

## 2013-12-16 NOTE — Telephone Encounter (Signed)
Pt left v/m requesting cb with status of hydrocodone apap rx.

## 2013-12-16 NOTE — Telephone Encounter (Signed)
Spoke with patient and advised rx ready for pick-up and it will be at the front desk.  

## 2014-01-13 ENCOUNTER — Other Ambulatory Visit: Payer: Self-pay

## 2014-01-13 MED ORDER — HYDROCODONE-ACETAMINOPHEN 5-325 MG PO TABS: ORAL_TABLET | ORAL | Status: DC

## 2014-01-13 NOTE — Telephone Encounter (Signed)
Pt left v/m requesting rx hydrocodone apap. Call when ready for pick up. Pt request pick up of rx today; pt will be going out of town and just found out our office is closed on 01/14/14.

## 2014-01-13 NOTE — Telephone Encounter (Signed)
Spoke with patient and advised rx ready for pick-up and it will be at the front desk.  

## 2014-01-27 ENCOUNTER — Other Ambulatory Visit: Payer: Self-pay | Admitting: Internal Medicine

## 2014-01-27 NOTE — Telephone Encounter (Signed)
Please advise if ok to refill , thank you.

## 2014-02-15 ENCOUNTER — Other Ambulatory Visit: Payer: Self-pay

## 2014-02-15 MED ORDER — HYDROCODONE-ACETAMINOPHEN 5-325 MG PO TABS: ORAL_TABLET | ORAL | Status: DC

## 2014-02-15 NOTE — Telephone Encounter (Signed)
Now low risk, recent OV, due for refill.  PCP out and pt needs refill. Okay to refill.

## 2014-02-15 NOTE — Telephone Encounter (Signed)
Pt left v/m requesting rx hydrocodone apap. Call when ready for pick up.  

## 2014-02-15 NOTE — Telephone Encounter (Signed)
Mr. Charlesworth notified prescription is ready to be picked up at front desk.

## 2014-03-16 ENCOUNTER — Other Ambulatory Visit: Payer: Self-pay | Admitting: Family Medicine

## 2014-03-16 MED ORDER — HYDROCODONE-ACETAMINOPHEN 5-325 MG PO TABS: ORAL_TABLET | ORAL | Status: DC

## 2014-03-16 NOTE — Telephone Encounter (Signed)
Spoke with patient and advised rx ready for pick-up and it will be at the front desk.  

## 2014-03-16 NOTE — Telephone Encounter (Signed)
Last filled 02/15/14

## 2014-03-29 ENCOUNTER — Other Ambulatory Visit: Payer: Self-pay | Admitting: Internal Medicine

## 2014-04-14 ENCOUNTER — Other Ambulatory Visit: Payer: Self-pay

## 2014-04-14 MED ORDER — HYDROCODONE-ACETAMINOPHEN 5-325 MG PO TABS
ORAL_TABLET | ORAL | Status: DC
Start: 2014-04-14 — End: 2014-05-13

## 2014-04-14 NOTE — Telephone Encounter (Signed)
Notified patient Rx ready for pick-up. Patient verbalized understanding.

## 2014-04-14 NOTE — Telephone Encounter (Signed)
Pt left v/m requesting rx hydrocodone apap. Call when ready for pick up.  

## 2014-04-19 ENCOUNTER — Other Ambulatory Visit: Payer: Self-pay | Admitting: Internal Medicine

## 2014-04-29 ENCOUNTER — Other Ambulatory Visit: Payer: Self-pay

## 2014-05-05 ENCOUNTER — Telehealth: Payer: Self-pay | Admitting: Internal Medicine

## 2014-05-05 NOTE — Telephone Encounter (Signed)
Pt dropped off form to be filled out by Dr Silvio Pate. Form placed on Dee's desk, to then give to Dr Silvio Pate. Thank you

## 2014-05-06 NOTE — Telephone Encounter (Signed)
Form done No charge 

## 2014-05-06 NOTE — Telephone Encounter (Signed)
Form on your desk  

## 2014-05-06 NOTE — Telephone Encounter (Signed)
Patient notified and form faxed to [facility].

## 2014-05-08 ENCOUNTER — Other Ambulatory Visit: Payer: Self-pay | Admitting: Internal Medicine

## 2014-05-08 DIAGNOSIS — K7030 Alcoholic cirrhosis of liver without ascites: Secondary | ICD-10-CM

## 2014-05-09 NOTE — Telephone Encounter (Signed)
May I refill Sir? 

## 2014-05-09 NOTE — Telephone Encounter (Signed)
OK top refill x 2  He also needs: 1) REV next avail 2) CBC, CMET alpha fetoprotein, PT/INR 3) [place] limited RUQ - cirrhosis - evaluate for liver mass/screen for liver cancer   Use cirrhosis dx

## 2014-05-09 NOTE — Telephone Encounter (Signed)
Spoke with patient and informed him that rx's refilled.  He is aware that he needs lab work, orders in.  He is also aware of upcoming appointments:  [place] at [facility] on 05/17/14 at 9:30 AM, arrive 9:15AM, NPO 6 hours.  Then REV with Dr. Carlean Purl on 05/26/14 at 10:15am.

## 2014-05-13 ENCOUNTER — Other Ambulatory Visit (INDEPENDENT_AMBULATORY_CARE_PROVIDER_SITE_OTHER): Payer: 59

## 2014-05-13 ENCOUNTER — Other Ambulatory Visit: Payer: Self-pay

## 2014-05-13 ENCOUNTER — Encounter: Payer: Self-pay | Admitting: Internal Medicine

## 2014-05-13 DIAGNOSIS — K7030 Alcoholic cirrhosis of liver without ascites: Secondary | ICD-10-CM

## 2014-05-13 LAB — CBC WITH DIFFERENTIAL/PLATELET
BASOS PCT: 0.7 % (ref 0.0–3.0)
Basophils Absolute: 0 10*3/uL (ref 0.0–0.1)
EOS ABS: 0.1 10*3/uL (ref 0.0–0.7)
Eosinophils Relative: 2 % (ref 0.0–5.0)
HCT: 44.4 % (ref 39.0–52.0)
HEMOGLOBIN: 14.8 g/dL (ref 13.0–17.0)
Lymphocytes Relative: 20.5 % (ref 12.0–46.0)
Lymphs Abs: 1.2 10*3/uL (ref 0.7–4.0)
MCHC: 33.3 g/dL (ref 30.0–36.0)
MCV: 96.9 fl (ref 78.0–100.0)
MONO ABS: 0.4 10*3/uL (ref 0.1–1.0)
Monocytes Relative: 7.3 % (ref 3.0–12.0)
NEUTROS ABS: 4.1 10*3/uL (ref 1.4–7.7)
Neutrophils Relative %: 69.5 % (ref 43.0–77.0)
Platelets: 138 10*3/uL — ABNORMAL LOW (ref 150.0–400.0)
RBC: 4.59 Mil/uL (ref 4.22–5.81)
RDW: 14 % (ref 11.5–15.5)
WBC: 5.9 10*3/uL (ref 4.0–10.5)

## 2014-05-13 LAB — COMPREHENSIVE METABOLIC PANEL
ALK PHOS: 86 U/L (ref 39–117)
ALT: 24 U/L (ref 0–53)
AST: 35 U/L (ref 0–37)
Albumin: 3.4 g/dL — ABNORMAL LOW (ref 3.5–5.2)
BUN: 10 mg/dL (ref 6–23)
CO2: 30 mEq/L (ref 19–32)
CREATININE: 0.8 mg/dL (ref 0.4–1.5)
Calcium: 9.4 mg/dL (ref 8.4–10.5)
Chloride: 102 mEq/L (ref 96–112)
GFR: 101.29 mL/min (ref >60.00)
Glucose, Bld: 103 mg/dL — ABNORMAL HIGH (ref 70–99)
Potassium: 4.9 mEq/L (ref 3.5–5.1)
Sodium: 137 mEq/L (ref 135–145)
Total Bilirubin: 1.2 mg/dL (ref 0.2–1.2)
Total Protein: 7.5 g/dL (ref 6.0–8.3)

## 2014-05-13 LAB — PROTIME-INR
INR: 1.1 ratio — ABNORMAL HIGH (ref 0.8–1.0)
Prothrombin Time: 12 s (ref 9.6–13.1)

## 2014-05-13 MED ORDER — HYDROCODONE-ACETAMINOPHEN 5-325 MG PO TABS: ORAL_TABLET | ORAL | Status: DC

## 2014-05-13 NOTE — Telephone Encounter (Signed)
Pt left v/m requesting rx hydrocodone apap. Call when ready for pick up. Pt needs to pick up rx today; pt will be out of med on 05/14/14.

## 2014-05-13 NOTE — Telephone Encounter (Signed)
Spoke to pt and informed him Rx is available for pickup at the front desk; pt advised 3rd party unable to pickup

## 2014-05-13 NOTE — Telephone Encounter (Signed)
Printed.  Thanks.  

## 2014-05-14 LAB — AFP TUMOR MARKER: AFP-Tumor Marker: 5.3 ng/mL (ref <6.1)

## 2014-05-14 NOTE — Progress Notes (Signed)
Quick Note:  Advised stable via My Chart ______

## 2014-05-17 ENCOUNTER — Ambulatory Visit (HOSPITAL_COMMUNITY)
Admission: RE | Admit: 2014-05-17 | Discharge: 2014-05-17 | Disposition: A | Discharge status: Home / Self Care | Payer: 59 | Source: Ambulatory Visit | Attending: Diagnostic Radiology | Admitting: Diagnostic Radiology

## 2014-05-17 DIAGNOSIS — K8080 Other cholelithiasis without obstruction: Secondary | ICD-10-CM | POA: Diagnosis not present

## 2014-05-17 DIAGNOSIS — K7030 Alcoholic cirrhosis of liver without ascites: Secondary | ICD-10-CM | POA: Diagnosis present

## 2014-05-19 NOTE — Progress Notes (Signed)
Quick Note:  Notified by My Chart ______ 

## 2014-05-24 ENCOUNTER — Encounter: Payer: Self-pay | Admitting: Internal Medicine

## 2014-05-26 ENCOUNTER — Encounter: Payer: Self-pay | Admitting: Internal Medicine

## 2014-05-26 ENCOUNTER — Ambulatory Visit (INDEPENDENT_AMBULATORY_CARE_PROVIDER_SITE_OTHER): Payer: 59 | Admitting: Internal Medicine

## 2014-05-26 VITALS — BP 130/70 | HR 68 | Ht 75.0 in | Wt 201.0 lb

## 2014-05-26 DIAGNOSIS — I8500 Esophageal varices without bleeding: Secondary | ICD-10-CM

## 2014-05-26 DIAGNOSIS — K7031 Alcoholic cirrhosis of liver with ascites: Secondary | ICD-10-CM

## 2014-05-26 NOTE — Patient Instructions (Addendum)
Follow up with Dr Carlean Purl in a year or sooner if needed.  We will notify you when it's time to get another ultrasound.   I appreciate the opportunity to care for you.

## 2014-05-26 NOTE — Assessment & Plan Note (Signed)
On Nadolol Does not need repeat EGD as no hx bleeding and small varices

## 2014-05-26 NOTE — Progress Notes (Signed)
   Subjective:    Patient ID: Billy [name], male    DOB: 08/18/1945, 68 y.o.   MRN: [id number]  HPI Here for f/u alcoholic cirrhosis.  No c/o Doing well Walks for exercise and is abstinent from EtOH " I don't miss that but I still crave cigarettes" No confusion, memory problems or sleepiness  Medications, allergies, past medical history, past surgical history, family history and social history are reviewed and updated in the EMR.  Review of Systems As above    Objective:   Physical Exam General:  Well-developed, well-nourished and in no acute distress Eyes:  anicteric. Lungs: Clear to auscultation bilaterally. Heart:  S1S2, no rubs, murmurs, gallops. Abdomen:  soft, non-tender, BS+, + abdominal wall veins present, liver firm 2 FB  Down, no splenomegaly Lymph:  no cervical or supraclavicular adenopathy. Extremities:   Trace bilateral pre-tibial (low) edema. Neuro:  A&O x 3. No asterixis Psych:  appropriate mood and  Affect.   Data Reviewed: Labs, [place] in EMR    Assessment & Plan:  Alcoholic cirrhosis of liver Doing well, abstinent Child's B Korea ok - repeat 6 months to screen for [facility] See me annually alternating with annual PCP visit  Esophageal varices - small - no bleeding hx - on beta blocker On Nadolol Does not need repeat EGD as no hx bleeding and small varices

## 2014-05-26 NOTE — Assessment & Plan Note (Addendum)
Doing well, abstinent Child's B [place] ok - repeat 6 months to screen for [facility] See me annually alternating with annual PCP visit

## 2014-06-13 ENCOUNTER — Other Ambulatory Visit: Payer: Self-pay

## 2014-06-13 MED ORDER — HYDROCODONE-ACETAMINOPHEN 5-325 MG PO TABS: ORAL_TABLET | ORAL | Status: DC

## 2014-06-13 NOTE — Telephone Encounter (Signed)
Pt called to ck on status of hydrocodone apap. Pt thought [facility] was closed on 06/10/14 and did not call for refill then; pt is out of medication and would like to pick up rx today.Please advise.

## 2014-06-13 NOTE — Telephone Encounter (Signed)
Pt left v/m requesting rx hydrocodone apap. Call when ready for pick up. Pt last seen 09/17/13 and has wellness exam scheduled on 09/26/14.

## 2014-06-14 ENCOUNTER — Other Ambulatory Visit: Payer: Self-pay | Admitting: Internal Medicine

## 2014-06-14 NOTE — Telephone Encounter (Signed)
Spoke with patient and advised rx ready for pick-up and it will be at the front desk.  

## 2014-07-13 ENCOUNTER — Other Ambulatory Visit: Payer: Self-pay

## 2014-07-13 MED ORDER — HYDROCODONE-ACETAMINOPHEN 5-325 MG PO TABS: ORAL_TABLET | ORAL | Status: DC

## 2014-07-13 NOTE — Telephone Encounter (Signed)
Spoke with patient and advised rx ready for pick-up and it will be at the front desk. Reminded pt he needs to give 24-48 hours for refill requests

## 2014-07-13 NOTE — Telephone Encounter (Signed)
Pt left v/m requesting rx hydrocodone apap. Call when ready for pick up. Pt is going out of town on 07/14/14 until next week and pt request to pick up 07/13/14 if possible.Please advise.

## 2014-07-15 ENCOUNTER — Other Ambulatory Visit: Payer: Self-pay | Admitting: Internal Medicine

## 2014-08-12 ENCOUNTER — Other Ambulatory Visit: Payer: Self-pay

## 2014-08-12 MED ORDER — HYDROCODONE-ACETAMINOPHEN 5-325 MG PO TABS: ORAL_TABLET | ORAL | Status: DC

## 2014-08-12 NOTE — Telephone Encounter (Signed)
Pt left v/m requesting rx hydrocodone apap. Call when ready for pick up. Pt will be out of med 08/14/14 and request to pick up rx today 08/12/14. Pt last seen 09/17/2013. Pt has CPX 09/26/14.

## 2014-08-12 NOTE — Telephone Encounter (Signed)
Spoke to pt and informed him Rx is available for pickup from the front desk 

## 2014-09-12 ENCOUNTER — Other Ambulatory Visit: Payer: Self-pay | Admitting: *Deleted

## 2014-09-12 MED ORDER — HYDROCODONE-ACETAMINOPHEN 5-325 MG PO TABS: ORAL_TABLET | ORAL | Status: DC

## 2014-09-12 NOTE — Telephone Encounter (Signed)
Patient aware hydrocodone rx is ready for pick up. 

## 2014-09-12 NOTE — Telephone Encounter (Signed)
Patient left a voicemail requesting refill. Last refill 08/12/14 #90 and has appointment scheduled 09/26/14. Call patient when ready for pickup.

## 2014-09-19 ENCOUNTER — Encounter: Payer: 59 | Admitting: Internal Medicine

## 2014-09-26 ENCOUNTER — Ambulatory Visit (INDEPENDENT_AMBULATORY_CARE_PROVIDER_SITE_OTHER): Payer: 59 | Admitting: Internal Medicine

## 2014-09-26 ENCOUNTER — Encounter: Payer: Self-pay | Admitting: Internal Medicine

## 2014-09-26 VITALS — BP 118/68 | HR 68 | Temp 98.6°F | Ht 74.5 in | Wt 204.5 lb

## 2014-09-26 DIAGNOSIS — K766 Portal hypertension: Secondary | ICD-10-CM

## 2014-09-26 DIAGNOSIS — K7030 Alcoholic cirrhosis of liver without ascites: Secondary | ICD-10-CM

## 2014-09-26 DIAGNOSIS — D696 Thrombocytopenia, unspecified: Secondary | ICD-10-CM | POA: Insufficient documentation

## 2014-09-26 DIAGNOSIS — Z23 Encounter for immunization: Secondary | ICD-10-CM

## 2014-09-26 DIAGNOSIS — I10 Essential (primary) hypertension: Secondary | ICD-10-CM

## 2014-09-26 DIAGNOSIS — F1021 Alcohol dependence, in remission: Secondary | ICD-10-CM

## 2014-09-26 DIAGNOSIS — M5137 Other intervertebral disc degeneration, lumbosacral region: Secondary | ICD-10-CM

## 2014-09-26 DIAGNOSIS — F1020 Alcohol dependence, uncomplicated: Secondary | ICD-10-CM | POA: Insufficient documentation

## 2014-09-26 DIAGNOSIS — I8500 Esophageal varices without bleeding: Secondary | ICD-10-CM

## 2014-09-26 DIAGNOSIS — Z0000 Encounter for general adult medical examination without abnormal findings: Secondary | ICD-10-CM

## 2014-09-26 NOTE — Assessment & Plan Note (Signed)
BP Readings from Last 3 Encounters:  09/26/14 118/68  05/26/14 130/70  09/17/13 128/70   Good control

## 2014-09-26 NOTE — Assessment & Plan Note (Signed)
Stable on the beta blocker--no bleeding

## 2014-09-26 NOTE — Progress Notes (Signed)
Pre visit review using our clinic review tool, if applicable. No additional management support is needed unless otherwise documented below in the visit note. 

## 2014-09-26 NOTE — Assessment & Plan Note (Signed)
Does okay with the hydrocodone

## 2014-09-26 NOTE — Assessment & Plan Note (Signed)
From cirrhosis No excessive bleeding or bruising Will recheck

## 2014-09-26 NOTE — Assessment & Plan Note (Signed)
prevnar today Pneumovax booster next year Consider repeat PSA next year UTD on colon Walks daily, etc

## 2014-09-26 NOTE — Assessment & Plan Note (Signed)
Doing well Abstinent from alcohol Yearly checks with Dr Carlean Purl

## 2014-09-26 NOTE — Progress Notes (Signed)
Subjective:    Patient ID: Billy [name], male    DOB: July 18, 1945, 69 y.o.   MRN: [id number]  HPI Here for physical Doing well Has remained abstinent with cigarettes and alcohol Tries to exercise regularly--- walks at least 3 miles a day (new dog)  Follows with Dr Carlean Purl yearly No recent problems Last ultrasound negative for mass Colonoscopy ~2013  Chronic back pain is stable Variable--worse in AM and some bad days Does okay with the regular hydrocodone.  Current Outpatient Prescriptions on File Prior to Visit  Medication Sig Dispense Refill  . Cholecalciferol (VITAMIN D3) 2000 UNITS TABS Take 2,000 Units by mouth daily.    . Coenzyme Q10 (CO Q 10 PO) Take 10 mLs by mouth every evening.    Marland Kitchen HYDROcodone-acetaminophen (NORCO/VICODIN) 5-325 MG per tablet TAKE 1 TABLET BY MOUTH 3 TIMES A DAY AS NEEDED 90 tablet 0  . KRILL OIL PO Take 1 capsule by mouth every evening. 350 mg    . mirtazapine (REMERON) 45 MG tablet TAKE ONE BY MOUTH AT BEDTIME (Patient taking differently: Take 1/2 by mouth at bedtime) 90 tablet 2  . Multiple Vitamin (MULTIVITAMIN WITH MINERALS) TABS Take 1 tablet by mouth daily.    . nadolol (CORGARD) 40 MG tablet TAKE 1 TABLET (40 MG TOTAL) BY MOUTH DAILY. 90 tablet 1  . omeprazole (PRILOSEC) 40 MG capsule TAKE 1 CAPSULE (40 MG TOTAL) BY MOUTH DAILY. 90 capsule 0  . psyllium (METAMUCIL) 58.6 % powder Take 1 packet by mouth daily.     Marland Kitchen spironolactone (ALDACTONE) 25 MG tablet TAKE 1 TABLET (25 MG TOTAL) BY MOUTH DAILY. 90 tablet 1   No current facility-administered medications on file prior to visit.    Allergies  Allergen Reactions  . Nsaids Other (See Comments)    Stomach pain  . Other     Blood thinners reaction unknown    Past Medical History  Diagnosis Date  . Depression     mild at most  . Diverticulosis of colon   . Sleep disturbance     takes Remeron nightly  . DDD (degenerative disc disease)   . Erectile dysfunction   . GI hemorrhage  2006, 2013    esophagitis and 1+ varices, melena in 2013   . Alcoholism   . Acute posthemorrhagic anemia   . Thrombocytopenia, unspecified   . Personal history of colonic polyps   . 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 Basal cell carcinoma   . Gastric ulcer 08/2011    small, antral ulcer  . Angiodysplasia of colon 09/11/2011    Multiple in the right colon, ablated with APC. Question if this is portal colopathy.   . 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 Campylobacter diarrhea 01/07/2012  . Alcoholic cirrhosis of liver 09/24/2011    Years of alcohol use. Progressed from fatty liver to cirrhosis based upon review imaging. Manifestations are portal hypertension with esophageal varices, portal gastropathy, rectal varices and he may have portal colopathy. Ascites also present. Naive to HAV and HBV Claims he has had pneumococcal vaccine   . Hypertension     takes Nadolol daily  . History of bronchitis     >33yrs ago  . GERD (gastroesophageal reflux disease)     takes Omeprazole prn  . Hydrocele   . History of blood transfusion     as a child and no abnormal reaction noted  . Back pain  Past Surgical History  Procedure Laterality Date  . Varicocele repair  1980's  . Knee arthroscopy  2008    Right  . Knee arthroscopy  2008    Left  . Colonoscopy  2001, 200411/02/2006  . Esophagogastroduodenoscopy  2006  . Esophagogastroduodenoscopy  09/11/2011    Procedure: ESOPHAGOGASTRODUODENOSCOPY (EGD);  Surgeon: Gatha Mayer, MD;  Location: [facility];  Service: Endoscopy;  Laterality: N/A;  . Colonoscopy  09/11/2011    Procedure: COLONOSCOPY;  Surgeon: Gatha Mayer, MD;  Location: [facility];  Service: Endoscopy;  Laterality: N/A;  . Cataract extraction  2013    bilateral with IOL  . [place] thora/paracentesis  10/28/2011  . Eye surgery    . Flexible sigmoidoscopy  01/07/2012    Procedure: FLEXIBLE SIGMOIDOSCOPY;  Surgeon: Lafayette Dragon,  MD;  Location: [facility];  Service: Endoscopy;  Laterality: N/A;  . Cholecystectomy  12/31/2012    Procedure: LAPAROSCOPIC CHOLECYSTECTOMY;  Surgeon: Zenovia Jarred, MD;  Location: [facility] OR;  Service: General;;    Family History  Problem Relation Age of Onset  . Stroke Mother   . Diabetes Mother   . Colon cancer Neg Hx     History   Social History  . Marital Status: Married    Spouse Name: N/A  . Number of Children: 1  . Years of Education: N/A   Occupational History  . Retired     mostly from [employer]   Social History Main Topics  . Smoking status: Former Smoker -- 0.50 packs/day    Types: Cigarettes  . Smokeless tobacco: Never Used     Comment: quit a couple of months ago  . Alcohol Use: No  . Drug Use: No  . Sexual Activity: No   Other Topics Concern  . Not on file   Social History Narrative   Married- 2nd   1 son (adopted) from previous marriage      No living will   Wife should be health care POA   Would accept attempts at resuscitation   No tube feeds if cognitively unaware         Review of Systems  Constitutional: Negative for fatigue and unexpected weight change.       Wears seat belt  HENT: Negative for dental problem, hearing loss and tinnitus.        Keeps up with dentist  Eyes: Negative for visual disturbance.       No diplopia or unilateral vision loss  Respiratory: Negative for cough, chest tightness and shortness of breath.   Cardiovascular: Negative for chest pain, palpitations and leg swelling.  Gastrointestinal: Negative for nausea, vomiting, abdominal pain, constipation and blood in stool.  Endocrine: Negative for polydipsia and polyuria.  Genitourinary: Negative for urgency, frequency and difficulty urinating.       Still has hydrocele Some ED  Musculoskeletal: Positive for back pain. Negative for joint swelling and arthralgias.  Skin: Negative for rash.       Sees derm yearly--no findings in December  Allergic/Immunologic: Negative  for environmental allergies and immunocompromised state.  Neurological: Negative for dizziness, syncope, weakness, light-headedness, numbness and headaches.  Hematological: Negative for adenopathy. Does not bruise/bleed easily.  Psychiatric/Behavioral: Negative for sleep disturbance and dysphoric mood. The patient is not nervous/anxious.        Only rare bad sleep nights        Objective:   Physical Exam  Constitutional: He is oriented to person, place, and time. He appears well-developed and well-nourished. No  distress.  HENT:  Head: Normocephalic and atraumatic.  Right Ear: External ear normal.  Left Ear: External ear normal.  Mouth/Throat: Oropharynx is clear and moist. No oropharyngeal exudate.  Eyes: Conjunctivae and EOM are normal. Pupils are equal, round, and reactive to light.  Neck: Normal range of motion. Neck supple. No thyromegaly present.  Cardiovascular: Normal rate, regular rhythm, normal heart sounds and intact distal pulses.  Exam reveals no gallop.   No murmur heard. Pulmonary/Chest: Effort normal and breath sounds normal. No respiratory distress. He has no wheezes. He has no rales.  Abdominal: Soft. There is no tenderness.  Musculoskeletal: He exhibits no edema or tenderness.  Lymphadenopathy:    He has no cervical adenopathy.  Neurological: He is alert and oriented to person, place, and time.  Skin: No rash noted. No erythema.  Psychiatric: He has a normal mood and affect. His behavior is normal.          Assessment & Plan:

## 2014-09-26 NOTE — Assessment & Plan Note (Signed)
No symptoms now on spironolactone

## 2014-09-26 NOTE — Assessment & Plan Note (Signed)
Doing well Not tempted

## 2014-09-26 NOTE — Addendum Note (Signed)
Addended by: Emelia Salisbury C on: 09/26/2014 03:27 PM   Modules accepted: Orders

## 2014-09-27 ENCOUNTER — Telehealth: Payer: Self-pay | Admitting: Internal Medicine

## 2014-09-27 LAB — CBC WITH DIFFERENTIAL/PLATELET
Basophils Absolute: 0 10*3/uL (ref 0.0–0.1)
Basophils Relative: 0.6 % (ref 0.0–3.0)
Eosinophils Absolute: 0.1 10*3/uL (ref 0.0–0.7)
Eosinophils Relative: 1.8 % (ref 0.0–5.0)
HEMATOCRIT: 42 % (ref 39.0–52.0)
Hemoglobin: 14.2 g/dL (ref 13.0–17.0)
LYMPHS ABS: 1.2 10*3/uL (ref 0.7–4.0)
Lymphocytes Relative: 18.2 % (ref 12.0–46.0)
MCHC: 33.8 g/dL (ref 30.0–36.0)
MCV: 95.1 fl (ref 78.0–100.0)
MONOS PCT: 8.2 % (ref 3.0–12.0)
Monocytes Absolute: 0.5 10*3/uL (ref 0.1–1.0)
NEUTROS PCT: 71.2 % (ref 43.0–77.0)
Neutro Abs: 4.7 10*3/uL (ref 1.4–7.7)
Platelets: 135 10*3/uL — ABNORMAL LOW (ref 150.0–400.0)
RBC: 4.42 Mil/uL (ref 4.22–5.81)
RDW: 13.8 % (ref 11.5–15.5)
WBC: 6.7 10*3/uL (ref 4.0–10.5)

## 2014-09-27 LAB — COMPREHENSIVE METABOLIC PANEL
ALT: 15 U/L (ref 0–53)
AST: 30 U/L (ref 0–37)
Albumin: 3.7 g/dL (ref 3.5–5.2)
Alkaline Phosphatase: 79 U/L (ref 39–117)
BUN: 7 mg/dL (ref 6–23)
CALCIUM: 9.2 mg/dL (ref 8.4–10.5)
CHLORIDE: 102 meq/L (ref 96–112)
CO2: 31 mEq/L (ref 19–32)
CREATININE: 0.8 mg/dL (ref 0.40–1.50)
GFR: 102.63 mL/min (ref >60.00)
Glucose, Bld: 84 mg/dL (ref 70–99)
Potassium: 4.5 mEq/L (ref 3.5–5.1)
Sodium: 136 mEq/L (ref 135–145)
TOTAL PROTEIN: 6.9 g/dL (ref 6.0–8.3)
Total Bilirubin: 1.2 mg/dL (ref 0.2–1.2)

## 2014-09-27 LAB — T4, FREE: Free T4: 0.68 ng/dL (ref 0.60–1.60)

## 2014-09-27 NOTE — Telephone Encounter (Signed)
emmi emailed °

## 2014-10-11 ENCOUNTER — Other Ambulatory Visit: Payer: Self-pay

## 2014-10-11 MED ORDER — HYDROCODONE-ACETAMINOPHEN 5-325 MG PO TABS: ORAL_TABLET | ORAL | Status: DC

## 2014-10-11 NOTE — Telephone Encounter (Signed)
Pt left v/m requesting rx hydrocodone apap. Call when ready for pick up. Pt last seen 09/26/14 and rx last printed 09/12/14.

## 2014-10-11 NOTE — Telephone Encounter (Signed)
Called and spoken to patient. rx is ready for pick up. Left in the front office.

## 2014-10-18 ENCOUNTER — Other Ambulatory Visit: Payer: Self-pay | Admitting: Internal Medicine

## 2014-10-30 ENCOUNTER — Other Ambulatory Visit: Payer: Self-pay | Admitting: Internal Medicine

## 2014-11-09 ENCOUNTER — Other Ambulatory Visit: Payer: Self-pay | Admitting: *Deleted

## 2014-11-09 MED ORDER — HYDROCODONE-ACETAMINOPHEN 5-325 MG PO TABS: ORAL_TABLET | ORAL | Status: DC

## 2014-11-09 NOTE — Telephone Encounter (Signed)
CPE 09/26/14.  Last filled #90 10/11/14.

## 2014-11-09 NOTE — Telephone Encounter (Signed)
Left message on machine that rx is ready for pick-up, and it will be at our front desk.  

## 2014-11-24 ENCOUNTER — Telehealth: Payer: Self-pay

## 2014-11-24 DIAGNOSIS — K7469 Other cirrhosis of liver: Secondary | ICD-10-CM

## 2014-11-24 NOTE — Telephone Encounter (Signed)
-----   Message from Kellie Moor, RN sent at 05/26/2014  1:38 PM EST ----- Regarding: FW: [place] in 6 months   ----- Message -----    From: Gatha Mayer, MD    Sent: 05/26/2014  12:53 PM      To: Kellie Moor, RN Subject: [place] in 6 months                                 Please create a reminder so we can order a limited RUQ [place] of liver in 6 months

## 2014-11-24 NOTE — Telephone Encounter (Signed)
Patient notified of need for [place].  He would like to try and schedule on 12/05/14.  He is advised that I will contact him tomorrow to schedule the [place].

## 2014-11-25 NOTE — Telephone Encounter (Signed)
Patient is scheduled for 12/05/14 8:30 at [facility].  Needs to be 6 hours NPO Left message for patient to call back

## 2014-11-25 NOTE — Telephone Encounter (Signed)
Patient notified of appt date and time and instructions.  He is asking if [facility] is in network with his insurance.  He is advised he will need to contact his insurance company and ask them what facilities are in- network.  He will call back if there is an issue with his insurance

## 2014-12-05 ENCOUNTER — Ambulatory Visit (HOSPITAL_COMMUNITY)
Admission: RE | Admit: 2014-12-05 | Discharge: 2014-12-05 | Disposition: A | Discharge status: Home / Self Care | Payer: 59 | Source: Ambulatory Visit | Attending: Internal Medicine | Admitting: Internal Medicine

## 2014-12-05 DIAGNOSIS — K7469 Other cirrhosis of liver: Secondary | ICD-10-CM

## 2014-12-05 DIAGNOSIS — K7460 Unspecified cirrhosis of liver: Secondary | ICD-10-CM | POA: Insufficient documentation

## 2014-12-06 NOTE — Progress Notes (Signed)
Quick Note:  Korea ok My Chart message sent Should see me in July or Aug ______

## 2014-12-08 ENCOUNTER — Other Ambulatory Visit: Payer: Self-pay | Admitting: Internal Medicine

## 2014-12-08 MED ORDER — HYDROCODONE-ACETAMINOPHEN 5-325 MG PO TABS: ORAL_TABLET | ORAL | Status: DC

## 2014-12-08 NOTE — Telephone Encounter (Signed)
Pt called Triage and requested refill of Rx, pt said he is 1 day early because he is leaving out of town tomorrow and request refill to be picked up today, please call when ready

## 2014-12-08 NOTE — Telephone Encounter (Signed)
Spoke to pt and informed him Rx is available for pick up from the front desk; pt advised third party unable to pickup.

## 2014-12-29 ENCOUNTER — Encounter: Payer: Self-pay | Admitting: Internal Medicine

## 2015-01-09 ENCOUNTER — Other Ambulatory Visit: Payer: Self-pay

## 2015-01-09 MED ORDER — HYDROCODONE-ACETAMINOPHEN 5-325 MG PO TABS: ORAL_TABLET | ORAL | Status: DC

## 2015-01-09 NOTE — Telephone Encounter (Signed)
Pt left v/m requesting rx hydrocodone apap. Call when ready for pick up. Last annual exam 09/26/14 and rx last printed # 90 on 12/08/14.Please advise.

## 2015-01-09 NOTE — Telephone Encounter (Signed)
Spoke with patient and advised rx ready for pick-up and it will be at the front desk.  

## 2015-01-12 ENCOUNTER — Other Ambulatory Visit: Payer: Self-pay | Admitting: Internal Medicine

## 2015-02-07 ENCOUNTER — Other Ambulatory Visit: Payer: Self-pay

## 2015-02-07 MED ORDER — HYDROCODONE-ACETAMINOPHEN 5-325 MG PO TABS: ORAL_TABLET | ORAL | Status: DC

## 2015-02-07 NOTE — Telephone Encounter (Signed)
RX printed and signed and given to DS

## 2015-02-07 NOTE — Telephone Encounter (Signed)
Pt left v/m requesting rx hydrocodone apap. Call when ready for pick up. rx last printed # 90 on 01/09/15. Last annual exam 09/26/14.

## 2015-02-07 NOTE — Telephone Encounter (Signed)
Spoke with patient and advised rx ready for pick-up and it will be at the front desk.  

## 2015-03-08 ENCOUNTER — Ambulatory Visit (INDEPENDENT_AMBULATORY_CARE_PROVIDER_SITE_OTHER): Payer: 59 | Admitting: Internal Medicine

## 2015-03-08 ENCOUNTER — Other Ambulatory Visit (INDEPENDENT_AMBULATORY_CARE_PROVIDER_SITE_OTHER): Payer: 59

## 2015-03-08 ENCOUNTER — Encounter: Payer: Self-pay | Admitting: Internal Medicine

## 2015-03-08 VITALS — BP 130/70 | HR 64 | Ht 74.0 in | Wt 194.0 lb

## 2015-03-08 DIAGNOSIS — K7031 Alcoholic cirrhosis of liver with ascites: Secondary | ICD-10-CM

## 2015-03-08 DIAGNOSIS — I8500 Esophageal varices without bleeding: Secondary | ICD-10-CM

## 2015-03-08 LAB — COMPREHENSIVE METABOLIC PANEL
ALBUMIN: 3.6 g/dL (ref 3.5–5.2)
ALK PHOS: 95 U/L (ref 39–117)
ALT: 13 U/L (ref 0–53)
AST: 27 U/L (ref 0–37)
BUN: 5 mg/dL — AB (ref 6–23)
CALCIUM: 9.5 mg/dL (ref 8.4–10.5)
CO2: 33 mEq/L — ABNORMAL HIGH (ref 19–32)
CREATININE: 0.68 mg/dL (ref 0.40–1.50)
Chloride: 100 mEq/L (ref 96–112)
GFR: 123.64 mL/min (ref >60.00)
Glucose, Bld: 116 mg/dL — ABNORMAL HIGH (ref 70–99)
POTASSIUM: 5 meq/L (ref 3.5–5.1)
SODIUM: 137 meq/L (ref 135–145)
TOTAL PROTEIN: 7.2 g/dL (ref 6.0–8.3)
Total Bilirubin: 1.2 mg/dL (ref 0.2–1.2)

## 2015-03-08 LAB — CBC WITH DIFFERENTIAL/PLATELET
BASOS PCT: 0.6 % (ref 0.0–3.0)
Basophils Absolute: 0 10*3/uL (ref 0.0–0.1)
EOS PCT: 2.1 % (ref 0.0–5.0)
Eosinophils Absolute: 0.1 10*3/uL (ref 0.0–0.7)
HEMATOCRIT: 43.8 % (ref 39.0–52.0)
HEMOGLOBIN: 14.7 g/dL (ref 13.0–17.0)
LYMPHS PCT: 15.6 % (ref 12.0–46.0)
Lymphs Abs: 1 10*3/uL (ref 0.7–4.0)
MCHC: 33.5 g/dL (ref 30.0–36.0)
MCV: 97.1 fl (ref 78.0–100.0)
MONO ABS: 0.5 10*3/uL (ref 0.1–1.0)
Monocytes Relative: 8.4 % (ref 3.0–12.0)
Neutro Abs: 4.8 10*3/uL (ref 1.4–7.7)
Neutrophils Relative %: 73.3 % (ref 43.0–77.0)
Platelets: 149 10*3/uL — ABNORMAL LOW (ref 150.0–400.0)
RBC: 4.52 Mil/uL (ref 4.22–5.81)
RDW: 14.4 % (ref 11.5–15.5)
WBC: 6.5 10*3/uL (ref 4.0–10.5)

## 2015-03-08 LAB — PROTIME-INR
INR: 1.2 ratio — AB (ref 0.8–1.0)
PROTHROMBIN TIME: 13.3 s — AB (ref 9.6–13.1)

## 2015-03-08 NOTE — Progress Notes (Signed)
   Subjective:    Patient ID: Billy [name], male    DOB: 05-28-1946, 69 y.o.   MRN: [id number] Cc: f/u cirrhosis HPI Doing well, no c/o. Has dieted some. Wt Readings from Last 3 Encounters:  03/08/15 194 lb (87.998 kg)  09/26/14 204 lb 8 oz (92.761 kg)  05/26/14 201 lb (91.173 kg)  recent US 11/2014 ok no masses, slight ascites.  Medications, allergies, past medical history, past surgical history, family history and social history are reviewed and updated in the EMR.  Review of Systems As above    Objective:   Physical Exam @BP  130/70 mmHg  Pulse 64  Ht 6\' 2"  (1.88 m)  Wt 194 lb (87.998 kg)  BMI 24.90 kg/m2@  General:  NAD Eyes:   anicteric Lungs:  clear Heart:: S1S2 no rubs, murmurs or gallops Abdomen:  Protuberant ? Small ascites soft and nontender, BS+ Ext:   No edema, cyanosis or clubbing  Data Reviewed:  11/2014 [place] 09/2014 labs     Assessment & Plan:  Alcoholic cirrhosis of liver Doing well CBC, CMET, AFP and INR today [place] again in 05/2015  Esophageal varices - small - no bleeding hx - on beta blocker Continue nadolol

## 2015-03-08 NOTE — Assessment & Plan Note (Signed)
Continue nadolol. 

## 2015-03-08 NOTE — Patient Instructions (Addendum)
Your physician has requested that you go to the basement for the following lab work before leaving today: CBC, CMET, INR, AFP  We will contact you regarding an ultrasound in November.  I appreciate the opportunity to care for you. Gatha Mayer, MD, [facility]

## 2015-03-08 NOTE — Assessment & Plan Note (Signed)
Doing well CBC, CMET, AFP and INR today Korea again in 05/2015

## 2015-03-09 ENCOUNTER — Other Ambulatory Visit: Payer: Self-pay

## 2015-03-09 LAB — AFP TUMOR MARKER: AFP TUMOR MARKER: 4.8 ng/mL (ref <6.1)

## 2015-03-09 MED ORDER — HYDROCODONE-ACETAMINOPHEN 5-325 MG PO TABS: ORAL_TABLET | ORAL | Status: DC

## 2015-03-09 NOTE — Telephone Encounter (Signed)
Spoke with patient and advised rx ready for pick-up and it will be at the front desk.  

## 2015-03-09 NOTE — Progress Notes (Signed)
Quick Note:  Labs all ok Needs recall for US liver 05/2015 ______

## 2015-03-09 NOTE — Telephone Encounter (Signed)
Pt left v/m requesting rx hydrocodone apap. Call when ready for pick up. rx last printed # 90 on 02/07/15; last annual exam on 09/26/14.

## 2015-04-07 ENCOUNTER — Other Ambulatory Visit: Payer: Self-pay

## 2015-04-07 MED ORDER — HYDROCODONE-ACETAMINOPHEN 5-325 MG PO TABS: ORAL_TABLET | ORAL | Status: DC

## 2015-04-07 NOTE — Telephone Encounter (Signed)
Patient notified that Rx was ready up front for pick up.

## 2015-04-07 NOTE — Telephone Encounter (Signed)
Pt left v/m requesting rx hydrocodone apap. Call when ready for pick up.rx last printed #90 on 03/09/15. Last annual 09/26/14. Pt request to pick up rx today.

## 2015-04-17 ENCOUNTER — Other Ambulatory Visit: Payer: Self-pay | Admitting: Internal Medicine

## 2015-05-03 ENCOUNTER — Other Ambulatory Visit: Payer: Self-pay | Admitting: Internal Medicine

## 2015-05-03 NOTE — Telephone Encounter (Signed)
Refill x 1 year 

## 2015-05-03 NOTE — Telephone Encounter (Signed)
May I refill Billy [name]? 

## 2015-05-05 ENCOUNTER — Other Ambulatory Visit: Payer: Self-pay | Admitting: *Deleted

## 2015-05-05 ENCOUNTER — Telehealth: Payer: Self-pay | Admitting: Internal Medicine

## 2015-05-05 ENCOUNTER — Encounter: Payer: Self-pay | Admitting: Internal Medicine

## 2015-05-05 DIAGNOSIS — Z0000 Encounter for general adult medical examination without abnormal findings: Secondary | ICD-10-CM

## 2015-05-05 MED ORDER — HYDROCODONE-ACETAMINOPHEN 5-325 MG PO TABS: ORAL_TABLET | ORAL | Status: DC

## 2015-05-05 NOTE — Telephone Encounter (Signed)
Last filled #90 04/07/15.  Patient would like to pick up today.  Please when ready to pick up [phone].

## 2015-05-05 NOTE — Telephone Encounter (Signed)
He is due for a Medicare wellness in March or soon after---he should set this up soon

## 2015-05-05 NOTE — Telephone Encounter (Signed)
Pt dropped off biometric ppw to be filled out and faxed to [phone] (on paper). Best number to contact pt [phone]. Placing ppw in Dr. Alla German tower

## 2015-05-05 NOTE — Telephone Encounter (Signed)
Spoke with patient and advised rx ready for pick-up and it will be at the front desk.  

## 2015-05-08 NOTE — Telephone Encounter (Signed)
Left message on machine that form ready and faxed, will be scanned

## 2015-05-08 NOTE — Telephone Encounter (Signed)
Form done No charge Cholesterol not done--- hopefully he can still get credit for it

## 2015-05-08 NOTE — Telephone Encounter (Signed)
Form on your desk  

## 2015-05-12 ENCOUNTER — Telehealth: Payer: Self-pay | Admitting: Internal Medicine

## 2015-05-12 DIAGNOSIS — K7030 Alcoholic cirrhosis of liver without ascites: Secondary | ICD-10-CM

## 2015-05-15 NOTE — Telephone Encounter (Signed)
Okay to set up lab visit I put the order in for the labs

## 2015-05-15 NOTE — Telephone Encounter (Signed)
Patient notified of [place] scheduled for 05/31/15 8:15 arrival for 8:30 appt.  He is advised to be NPO after midnight

## 2015-05-15 NOTE — Telephone Encounter (Signed)
Patient called and said he needs the cholesterol filled out on his form.  He's asking if he can come by and have lab work done, so it can be filled out on his form.  Patient can be reached at [phone].

## 2015-05-15 NOTE — Addendum Note (Signed)
Addended by: Viviana Simpler I on: 05/15/2015 01:32 PM   Modules accepted: Orders

## 2015-05-16 ENCOUNTER — Other Ambulatory Visit (INDEPENDENT_AMBULATORY_CARE_PROVIDER_SITE_OTHER): Payer: 59

## 2015-05-16 DIAGNOSIS — Z0000 Encounter for general adult medical examination without abnormal findings: Secondary | ICD-10-CM

## 2015-05-16 LAB — LIPID PANEL
CHOLESTEROL: 149 mg/dL (ref 0–200)
HDL: 45.2 mg/dL (ref >39.00)
LDL Cholesterol: 84 mg/dL (ref 0–99)
NONHDL: 103.84
Total CHOL/HDL Ratio: 3
Triglycerides: 100 mg/dL (ref 0.0–149.0)
VLDL: 20 mg/dL (ref 0.0–40.0)

## 2015-05-23 ENCOUNTER — Encounter: Payer: Self-pay | Admitting: Internal Medicine

## 2015-05-31 ENCOUNTER — Ambulatory Visit (HOSPITAL_COMMUNITY)
Admission: RE | Admit: 2015-05-31 | Discharge: 2015-05-31 | Disposition: A | Discharge status: Home / Self Care | Payer: 59 | Source: Ambulatory Visit | Attending: Internal Medicine | Admitting: Internal Medicine

## 2015-05-31 DIAGNOSIS — R161 Splenomegaly, not elsewhere classified: Secondary | ICD-10-CM | POA: Diagnosis not present

## 2015-05-31 DIAGNOSIS — K7460 Unspecified cirrhosis of liver: Secondary | ICD-10-CM | POA: Insufficient documentation

## 2015-05-31 DIAGNOSIS — K7030 Alcoholic cirrhosis of liver without ascites: Secondary | ICD-10-CM

## 2015-05-31 DIAGNOSIS — R188 Other ascites: Secondary | ICD-10-CM | POA: Diagnosis not present

## 2015-05-31 NOTE — Progress Notes (Signed)
Quick Note:  No tumors/masses in the liver There is more fluid/ascites in the abdomen Will recommend increasing diuretics via My Chart ______

## 2015-06-05 ENCOUNTER — Encounter: Payer: Self-pay | Admitting: Internal Medicine

## 2015-06-05 ENCOUNTER — Other Ambulatory Visit: Payer: Self-pay | Admitting: *Deleted

## 2015-06-05 MED ORDER — HYDROCODONE-ACETAMINOPHEN 5-325 MG PO TABS: ORAL_TABLET | ORAL | Status: DC

## 2015-06-05 NOTE — Telephone Encounter (Signed)
Patient left a voicemail requesting refill on Hydrocodone. Last refill 05/05/15 #90 Last office visit 09/26/14 Call when ready for pickup.

## 2015-06-05 NOTE — Telephone Encounter (Signed)
Spoke with patient and advised rx ready for pick-up and it will be at the front desk.  

## 2015-07-04 ENCOUNTER — Other Ambulatory Visit: Payer: Self-pay

## 2015-07-04 MED ORDER — HYDROCODONE-ACETAMINOPHEN 5-325 MG PO TABS: ORAL_TABLET | ORAL | Status: DC

## 2015-07-04 NOTE — Telephone Encounter (Signed)
Pt left v/m requesting rx hydrocodone apap. Call when ready for pick up. rx last printed # 90 on 06/05/15. Last annual exam 09/26/14.

## 2015-07-04 NOTE — Telephone Encounter (Signed)
Left message on machine that rx is ready for pick-up, and it will be at our front desk.  

## 2015-07-19 ENCOUNTER — Other Ambulatory Visit: Payer: Self-pay | Admitting: Internal Medicine

## 2015-08-02 ENCOUNTER — Other Ambulatory Visit: Payer: Self-pay

## 2015-08-02 NOTE — Telephone Encounter (Signed)
Pt left v/m requesting rx hydrocodone apap. Call when ready for pick up. rx last printed # 90 on 07/04/15. Last seen 09/26/14.

## 2015-08-03 ENCOUNTER — Other Ambulatory Visit: Payer: Self-pay | Admitting: Internal Medicine

## 2015-08-03 MED ORDER — HYDROCODONE-ACETAMINOPHEN 5-325 MG PO TABS: ORAL_TABLET | ORAL | Status: DC

## 2015-08-03 NOTE — Telephone Encounter (Signed)
Pt has already been notified Rx ready for pick-up

## 2015-08-03 NOTE — Telephone Encounter (Signed)
Pt left v/m requesting status of hydrocodone apap.

## 2015-08-03 NOTE — Telephone Encounter (Signed)
Pt notified Rx ready for pickup 

## 2015-08-03 NOTE — Telephone Encounter (Signed)
Approved:already printed

## 2015-08-28 ENCOUNTER — Other Ambulatory Visit: Payer: Self-pay | Admitting: Internal Medicine

## 2015-09-01 ENCOUNTER — Other Ambulatory Visit: Payer: Self-pay

## 2015-09-01 MED ORDER — HYDROCODONE-ACETAMINOPHEN 5-325 MG PO TABS: ORAL_TABLET | ORAL | Status: DC

## 2015-09-01 NOTE — Telephone Encounter (Signed)
Spoke with patient and advised rx ready for pick-up and it will be at the front desk.  

## 2015-09-01 NOTE — Telephone Encounter (Signed)
Pt left v/m requesting rx hydrocodone apap. Call when ready for pick up. Pt request to pick up this afternoon. Last printed # 90 on 08/03/15. Last seen 09/26/14.

## 2015-10-02 ENCOUNTER — Other Ambulatory Visit: Payer: Self-pay

## 2015-10-02 MED ORDER — HYDROCODONE-ACETAMINOPHEN 5-325 MG PO TABS: ORAL_TABLET | ORAL | Status: DC

## 2015-10-02 NOTE — Telephone Encounter (Signed)
Rx ready for pick up up front. Spoke to pt

## 2015-10-02 NOTE — Telephone Encounter (Signed)
Pt left v/m requesting rx hydrocodone apap. Call when ready for pick up. rx last printed # 90 on 09/01/15. Last seen 09/26/14 and has CPX scheduled 10/18/15.

## 2015-10-18 ENCOUNTER — Encounter: Payer: Self-pay | Admitting: Internal Medicine

## 2015-10-18 ENCOUNTER — Ambulatory Visit (INDEPENDENT_AMBULATORY_CARE_PROVIDER_SITE_OTHER): Payer: 59 | Admitting: Internal Medicine

## 2015-10-18 VITALS — BP 112/76 | HR 65 | Temp 97.5°F | Ht 74.25 in | Wt 192.0 lb

## 2015-10-18 DIAGNOSIS — F1021 Alcohol dependence, in remission: Secondary | ICD-10-CM

## 2015-10-18 DIAGNOSIS — Z0000 Encounter for general adult medical examination without abnormal findings: Secondary | ICD-10-CM | POA: Diagnosis not present

## 2015-10-18 DIAGNOSIS — K7031 Alcoholic cirrhosis of liver with ascites: Secondary | ICD-10-CM

## 2015-10-18 DIAGNOSIS — K766 Portal hypertension: Secondary | ICD-10-CM

## 2015-10-18 DIAGNOSIS — M549 Dorsalgia, unspecified: Secondary | ICD-10-CM

## 2015-10-18 DIAGNOSIS — Z23 Encounter for immunization: Secondary | ICD-10-CM

## 2015-10-18 DIAGNOSIS — D696 Thrombocytopenia, unspecified: Secondary | ICD-10-CM

## 2015-10-18 DIAGNOSIS — G8929 Other chronic pain: Secondary | ICD-10-CM

## 2015-10-18 LAB — LIPID PANEL
CHOLESTEROL: 153 mg/dL (ref 0–200)
HDL: 50.6 mg/dL (ref >39.00)
LDL CALC: 87 mg/dL (ref 0–99)
NONHDL: 102.53
Total CHOL/HDL Ratio: 3
Triglycerides: 80 mg/dL (ref 0.0–149.0)
VLDL: 16 mg/dL (ref 0.0–40.0)

## 2015-10-18 LAB — CBC WITH DIFFERENTIAL/PLATELET
BASOS PCT: 0.8 % (ref 0.0–3.0)
Basophils Absolute: 0.1 10*3/uL (ref 0.0–0.1)
EOS PCT: 2.3 % (ref 0.0–5.0)
Eosinophils Absolute: 0.2 10*3/uL (ref 0.0–0.7)
HEMATOCRIT: 42.7 % (ref 39.0–52.0)
HEMOGLOBIN: 14.2 g/dL (ref 13.0–17.0)
Lymphocytes Relative: 16.9 % (ref 12.0–46.0)
Lymphs Abs: 1.2 10*3/uL (ref 0.7–4.0)
MCHC: 33.3 g/dL (ref 30.0–36.0)
MCV: 98 fl (ref 78.0–100.0)
MONOS PCT: 9.5 % (ref 3.0–12.0)
Monocytes Absolute: 0.7 10*3/uL (ref 0.1–1.0)
Neutro Abs: 4.8 10*3/uL (ref 1.4–7.7)
Neutrophils Relative %: 70.5 % (ref 43.0–77.0)
Platelets: 159 10*3/uL (ref 150.0–400.0)
RBC: 4.35 Mil/uL (ref 4.22–5.81)
RDW: 14.9 % (ref 11.5–15.5)
WBC: 6.9 10*3/uL (ref 4.0–10.5)

## 2015-10-18 LAB — COMPREHENSIVE METABOLIC PANEL
ALK PHOS: 106 U/L (ref 39–117)
ALT: 16 U/L (ref 0–53)
AST: 32 U/L (ref 0–37)
Albumin: 3.4 g/dL — ABNORMAL LOW (ref 3.5–5.2)
BUN: 7 mg/dL (ref 6–23)
CHLORIDE: 100 meq/L (ref 96–112)
CO2: 34 meq/L — AB (ref 19–32)
Calcium: 9.3 mg/dL (ref 8.4–10.5)
Creatinine, Ser: 0.65 mg/dL (ref 0.40–1.50)
GFR: 130.01 mL/min (ref >60.00)
GLUCOSE: 103 mg/dL — AB (ref 70–99)
POTASSIUM: 4.9 meq/L (ref 3.5–5.1)
SODIUM: 137 meq/L (ref 135–145)
TOTAL PROTEIN: 7.1 g/dL (ref 6.0–8.3)
Total Bilirubin: 1.7 mg/dL — ABNORMAL HIGH (ref 0.2–1.2)

## 2015-10-18 LAB — T4, FREE: Free T4: 0.8 ng/dL (ref 0.60–1.60)

## 2015-10-18 NOTE — Assessment & Plan Note (Signed)
Stable  Some ascites 6 month ultrasound screening by Dr Carlean Purl

## 2015-10-18 NOTE — Progress Notes (Signed)
Pre visit review using our clinic review tool, if applicable. No additional management support is needed unless otherwise documented below in the visit note. 

## 2015-10-18 NOTE — Assessment & Plan Note (Signed)
Stable on hydrocodone

## 2015-10-18 NOTE — Addendum Note (Signed)
Addended by: Pilar Grammes on: 10/18/2015 10:27 AM   Modules accepted: Orders

## 2015-10-18 NOTE — Assessment & Plan Note (Signed)
Doing well Last colon 2013--no polyps Discussed PSA---will skip Tdap today

## 2015-10-18 NOTE — Assessment & Plan Note (Signed)
No abnormal bleeding of late Follows with GI diuretics

## 2015-10-18 NOTE — Addendum Note (Signed)
Addended by: Daralene Milch C on: 10/18/2015 10:41 AM   Modules accepted: SmartSet

## 2015-10-18 NOTE — Progress Notes (Signed)
Subjective:    Patient ID: Billy [name], male    DOB: September 02, 1945, 70 y.o.   MRN: [id number]  HPI Here for physical  Doing well Keeps up with Dr Isa Rankin every 6 months--last was still fine Still not drinking! Lost 12# since my last visit--- stable for 6 months Appetite isn't spectacular--but does okay  Ongoing problems with hydrocele Going to [facility] to consider Rx Very uncomfortable---especially at night  Ongoing back pain Continues on the hydrocodone Checked [place] Controlled substance site---no other Rx except from me  Current Outpatient Prescriptions on File Prior to Visit  Medication Sig Dispense Refill  . Cholecalciferol (VITAMIN D3) 2000 UNITS TABS Take 2,000 Units by mouth daily.    Marland Kitchen HYDROcodone-acetaminophen (NORCO/VICODIN) 5-325 MG tablet TAKE 1 TABLET BY MOUTH 3 TIMES A DAY AS NEEDED 90 tablet 0  . KRILL OIL PO Take 1 capsule by mouth every evening. 350 mg    . mirtazapine (REMERON) 45 MG tablet TAKE 1 TABLET BY MOUTH AT BEDTIME 90 tablet 0  . Multiple Vitamin (MULTIVITAMIN WITH MINERALS) TABS Take 1 tablet by mouth daily.    . nadolol (CORGARD) 40 MG tablet TAKE 1 TABLET (40 MG TOTAL) BY MOUTH DAILY. 90 tablet 3  . omeprazole (PRILOSEC) 40 MG capsule TAKE 1 CAPSULE (40 MG TOTAL) BY MOUTH DAILY. 90 capsule 0  . psyllium (METAMUCIL) 58.6 % powder Take 1 packet by mouth daily.     Marland Kitchen spironolactone (ALDACTONE) 25 MG tablet TAKE 1 TABLET BY MOUTH EVERY DAY 90 tablet 3  . vitamin E (VITAMIN E) 400 UNIT capsule Take 400 Units by mouth daily.     No current facility-administered medications on file prior to visit.    Allergies  Allergen Reactions  . Bee Venom     Yellow Jackets   . Nsaids Other (See Comments)    Stomach pain  . Other     Blood thinners reaction unknown    Past Medical History  Diagnosis Date  . Depression     mild at most  . Diverticulosis of colon   . Sleep disturbance     takes Remeron nightly  . DDD (degenerative  disc disease)   . Erectile dysfunction   . GI hemorrhage 2006, 2013    esophagitis and 1+ varices, melena in 2013   . Alcoholism ([place])   . Acute posthemorrhagic anemia   . Thrombocytopenia, unspecified ([place])   . Personal history of colonic polyps   . 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 Varices, esophageal ([place]) 08/2011    Grade 2, mid-esophagus  . Basal cell carcinoma   . Gastric ulcer 08/2011    small, antral ulcer  . Angiodysplasia of colon 09/11/2011    Multiple in the right colon, ablated with APC. Question if this is portal colopathy.   . 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 Campylobacter diarrhea 01/07/2012  . Alcoholic cirrhosis of liver ([place]) 09/24/2011    Years of alcohol use. Progressed from fatty liver to cirrhosis based upon review imaging. Manifestations are portal hypertension with esophageal varices, portal gastropathy, rectal varices and he may have portal colopathy. Ascites also present. Naive to HAV and HBV Claims he has had pneumococcal vaccine   . Hypertension     takes Nadolol daily  . History of bronchitis     >74yrs ago  . GERD (gastroesophageal reflux disease)     takes Omeprazole prn  . Hydrocele   . History of blood transfusion     as a  child and no abnormal reaction noted  . Back pain     Past Surgical History  Procedure Laterality Date  . Varicocele repair  1980's  . Knee arthroscopy  2008    Right  . Knee arthroscopy  2008    Left  . Colonoscopy  2001, 200411/02/2006  . Esophagogastroduodenoscopy  2006  . Esophagogastroduodenoscopy  09/11/2011    Procedure: ESOPHAGOGASTRODUODENOSCOPY (EGD);  Surgeon: Gatha Mayer, MD;  Location: [facility];  Service: Endoscopy;  Laterality: N/A;  . Colonoscopy  09/11/2011    Procedure: COLONOSCOPY;  Surgeon: Gatha Mayer, MD;  Location: [facility];  Service: Endoscopy;  Laterality: N/A;  . Cataract extraction  2013    bilateral with IOL  . [place] thora/paracentesis  10/28/2011  . Eye surgery    . Flexible  sigmoidoscopy  01/07/2012    Procedure: FLEXIBLE SIGMOIDOSCOPY;  Surgeon: Lafayette Dragon, MD;  Location: [facility];  Service: Endoscopy;  Laterality: N/A;  . Cholecystectomy  12/31/2012    Procedure: LAPAROSCOPIC CHOLECYSTECTOMY;  Surgeon: Zenovia Jarred, MD;  Location: [facility] OR;  Service: General;;    Family History  Problem Relation Age of Onset  . Stroke Mother   . Diabetes Mother   . Colon cancer Neg Hx     Social History   Social History  . Marital Status: Married    Spouse Name: N/A  . Number of Children: 1  . Years of Education: N/A   Occupational History  . Retired     mostly from [employer]   Social History Main Topics  . Smoking status: Former Smoker -- 0.50 packs/day    Types: Cigarettes  . Smokeless tobacco: Never Used     Comment: quit a couple of months ago  . Alcohol Use: No  . Drug Use: No  . Sexual Activity: No   Other Topics Concern  . Not on file   Social History Narrative   Married- 2nd   1 son (adopted) from previous marriage      No living will   Wife should be health care POA   Would accept attempts at resuscitation   No tube feeds if cognitively unaware         Review of Systems  Constitutional: Negative for fatigue.       Wears seat belt  HENT: Negative for hearing loss and tinnitus.        Needs new crown--keeps up with dentist  Eyes: Negative for visual disturbance.       No diplopia or unilateral vision loss  Respiratory: Negative for cough, chest tightness and shortness of breath.   Cardiovascular: Negative for chest pain, palpitations and leg swelling.  Gastrointestinal: Negative for nausea, vomiting, abdominal pain and blood in stool.       No heartburn on daily PPI  Endocrine: Negative for polydipsia and polyuria.       Has lost chest hair  Genitourinary: Negative for urgency, frequency and difficulty urinating.       No sex--no problem  Musculoskeletal: Positive for back pain. Negative for joint swelling and arthralgias.    Skin: Negative for rash.  Allergic/Immunologic: Positive for environmental allergies. Negative for immunocompromised state.       Mild rhinorrhea--no Rx  Neurological: Negative for dizziness, syncope, weakness, light-headedness and headaches.  Hematological: Negative for adenopathy. Does not bruise/bleed easily.  Psychiatric/Behavioral: Negative for sleep disturbance and dysphoric mood. The patient is nervous/anxious.        Objective:   Physical Exam  Constitutional:  He is oriented to person, place, and time. He appears well-developed and well-nourished. No distress.  HENT:  Head: Normocephalic and atraumatic.  Right Ear: External ear normal.  Left Ear: External ear normal.  Mouth/Throat: Oropharynx is clear and moist. No oropharyngeal exudate.  Eyes: Conjunctivae are normal.  Left pupil dilated  Neck: Normal range of motion. Neck supple. No thyromegaly present.  Cardiovascular: Normal rate, regular rhythm, normal heart sounds and intact distal pulses.  Exam reveals no gallop.   No murmur heard. Pulmonary/Chest: Effort normal and breath sounds normal. No respiratory distress. He has no wheezes. He has no rales.  Abdominal: Soft. He exhibits no mass. There is no tenderness. There is no rebound and no guarding.  Mild distention   Musculoskeletal: He exhibits no edema or tenderness.  Lymphadenopathy:    He has no cervical adenopathy.  Neurological: He is alert and oriented to person, place, and time.  Skin: No rash noted. No erythema.  Psychiatric: He has a normal mood and affect. His behavior is normal.          Assessment & Plan:

## 2015-10-18 NOTE — Assessment & Plan Note (Signed)
Mild--related to liver disease

## 2015-10-18 NOTE — Assessment & Plan Note (Signed)
Abstinent for years

## 2015-10-25 ENCOUNTER — Other Ambulatory Visit: Payer: Self-pay | Admitting: Internal Medicine

## 2015-10-31 ENCOUNTER — Other Ambulatory Visit: Payer: Self-pay

## 2015-10-31 MED ORDER — HYDROCODONE-ACETAMINOPHEN 5-325 MG PO TABS: ORAL_TABLET | ORAL | Status: DC

## 2015-10-31 NOTE — Telephone Encounter (Signed)
Pt left v/m requesting rx hydrocodone apap. Call when ready for pick up. rx last printed # 90 on 10/02/15; last annual 10/18/15.

## 2015-10-31 NOTE — Telephone Encounter (Signed)
Spoke to patient. Rx ready for pick up

## 2015-11-09 ENCOUNTER — Telehealth: Payer: Self-pay

## 2015-11-09 NOTE — Telephone Encounter (Signed)
Received office note from Dr Jeffie Pollock stating that patient needed to be seen by Dr Carlean Purl regarding meds. Appointment set up for 11/20/15 at 3 pm.

## 2015-11-17 ENCOUNTER — Encounter: Payer: Self-pay | Admitting: Gastroenterology

## 2015-11-20 ENCOUNTER — Encounter: Payer: Self-pay | Admitting: Internal Medicine

## 2015-11-20 ENCOUNTER — Ambulatory Visit (INDEPENDENT_AMBULATORY_CARE_PROVIDER_SITE_OTHER): Payer: 59 | Admitting: Internal Medicine

## 2015-11-20 ENCOUNTER — Ambulatory Visit: Payer: 59 | Admitting: Internal Medicine

## 2015-11-20 VITALS — BP 138/68 | HR 68 | Ht 74.0 in | Wt 191.4 lb

## 2015-11-20 DIAGNOSIS — K7031 Alcoholic cirrhosis of liver with ascites: Secondary | ICD-10-CM | POA: Diagnosis not present

## 2015-11-20 DIAGNOSIS — N5089 Other specified disorders of the male genital organs: Secondary | ICD-10-CM | POA: Diagnosis not present

## 2015-11-20 DIAGNOSIS — R188 Other ascites: Secondary | ICD-10-CM | POA: Diagnosis not present

## 2015-11-20 DIAGNOSIS — N433 Hydrocele, unspecified: Secondary | ICD-10-CM | POA: Insufficient documentation

## 2015-11-20 MED ORDER — SPIRONOLACTONE 50 MG PO TABS: 50.0000 mg | ORAL_TABLET | Freq: Every day | ORAL | Status: DC

## 2015-11-20 MED ORDER — FUROSEMIDE 40 MG PO TABS: 40.0000 mg | ORAL_TABLET | Freq: Every day | ORAL | Status: DC

## 2015-11-20 NOTE — Assessment & Plan Note (Signed)
Suspect ascites is more of a problem in a hydrocele just is Dr. Jeffie Pollock did.

## 2015-11-20 NOTE — Assessment & Plan Note (Signed)
I think this is certainly contributing to his scrotal swelling. He did have moderate ascites on last ultrasound but he was tolerating it so I didn't push and to dryness. Ear these problems with the scrotum or new or he was just dealing with them, now them aware I think we will increase his diuretics to 40 mg furosemide morning which is a new medication and 50 mg spironolactone. Might need to go further pending response. Bmet in one month.

## 2015-11-20 NOTE — Patient Instructions (Addendum)
   You have been scheduled for an abdominal ultrasound at [facility] (1st floor of hospital) on 12/04/15 at 10:45AM. Please arrive 15 minutes prior to your appointment for registration. Make certain not to have anything to eat or drink 6 hours prior to your appointment. Should you need to reschedule your appointment, please contact radiology at [phone]. This test typically takes about 30 minutes to perform.   Please come and get labs drawn in early June.  We have sent the following medications to your pharmacy for you to pick up at your convenience: Spironolactone and furosemide  Follow up with Dr Carlean Purl in 3 months.   I appreciate the opportunity to care for you.

## 2015-11-20 NOTE — Assessment & Plan Note (Addendum)
Stable but ascites likely worse, needs routine ultrasound screening for tumor will get alpha-fetoprotein and an INR in a month when I recheck his electrolytes after increasing diuretics Return to clinic in 3 months

## 2015-11-20 NOTE — Progress Notes (Signed)
   Subjective:    Patient ID: Billy [name], male    DOB: [birth year], 70 y.o.   MRN: [id number] Chief complaint: Scrotal swelling hydrocele, follow-up cirrhosis HPI The patient is here today, routine follow-up basically but he had seen urology recently, he has a hydrocele, he saw Dr. Jeffie Pollock. Is a right hydrocele Dr. Jeffie Pollock thought that the swelling in that area might be helped by increasing his diuretics. Patient reports that is not very swollen today but when it does swell [place] irritates him and his criteria and in between his legs, and is quite uncomfortable. Otherwise he seems to be feeling fairly well he plays golf he is active. He is not having any particular problems.  Wt Readings from Last 3 Encounters:  11/20/15 191 lb 6 oz (86.807 kg)  10/18/15 192 lb (87.091 kg)  03/08/15 194 lb (87.998 kg)   Medications, allergies, past medical history, past surgical history, family history and social history are reviewed and updated in the EMR.  Review of Systems As above    Objective:   Physical Exam @BP  138/68 mmHg  Pulse 68  Ht 6\' 2"  (1.88 m)  Wt 191 lb 6 oz (86.807 kg)  BMI 24.56 kg/m2@  General:  Well-developed, well-nourished and in no acute distress Eyes:  anicteric. Lungs: Clear to auscultation bilaterally. Heart:  S1S2, no rubs, murmurs, gallops. Abdomen:  soft, non-tender, no hepatosplenomegaly, hernia, or mass and BS+. It is distended with moderate ascites I can feel a shrunken firm liver in the right upper quadrant about 2 fingerbreadths below the costal margin GU:  There is moderate enlargement of both scrotal sacs. Circumcised penis is somewhat retracted. There is a bulge in the inguinal area as well that is visible. Fluid-filled I suspect. Extremities:   no edema, cyanosis or clubbing Neuro:  A&O x 3.  Psych:  appropriate mood and  Affect.   Data Reviewed:  Urology notes including ultrasound report showing right hydrocele left varicocele and right varicocele on  10/25/2015.      Assessment & Plan:  Alcoholic cirrhosis of liver Stable but ascites likely worse, needs routine ultrasound screening for tumor will get alpha-fetoprotein and an INR in a month when I recheck his electrolytes after increasing diuretics Return to clinic in 3 months  Ascites I think this is certainly contributing to his scrotal swelling. He did have moderate ascites on last ultrasound but he was tolerating it so I didn't push and to dryness. Ear these problems with the scrotum or new or he was just dealing with them, now them aware I think we will increase his diuretics to 40 mg furosemide morning which is a new medication and 50 mg spironolactone. Might need to go further pending response. Bmet in one month.  Hydrocele in adult Suspect ascites is more of a problem in a hydrocele just is Dr. Jeffie Pollock did.  Scrotal edema Believe related to ascites. Increase diuretics as above.  CC: Viviana Simpler, MD Irine Seal M.D.

## 2015-11-20 NOTE — Assessment & Plan Note (Signed)
Believe related to ascites. Increase diuretics as above.

## 2015-11-27 ENCOUNTER — Encounter: Payer: Self-pay | Admitting: Internal Medicine

## 2015-11-28 ENCOUNTER — Other Ambulatory Visit (INDEPENDENT_AMBULATORY_CARE_PROVIDER_SITE_OTHER): Payer: 59

## 2015-11-28 DIAGNOSIS — R188 Other ascites: Secondary | ICD-10-CM

## 2015-11-28 DIAGNOSIS — K7031 Alcoholic cirrhosis of liver with ascites: Secondary | ICD-10-CM

## 2015-11-28 DIAGNOSIS — N433 Hydrocele, unspecified: Secondary | ICD-10-CM | POA: Diagnosis not present

## 2015-11-28 DIAGNOSIS — N5089 Other specified disorders of the male genital organs: Secondary | ICD-10-CM

## 2015-11-28 LAB — BASIC METABOLIC PANEL
BUN: 10 mg/dL (ref 6–23)
CHLORIDE: 95 meq/L — AB (ref 96–112)
CO2: 32 meq/L (ref 19–32)
CREATININE: 0.78 mg/dL (ref 0.40–1.50)
Calcium: 9.8 mg/dL (ref 8.4–10.5)
GFR: 105.3 mL/min (ref >60.00)
GLUCOSE: 119 mg/dL — AB (ref 70–99)
Potassium: 4.6 mEq/L (ref 3.5–5.1)
Sodium: 135 mEq/L (ref 135–145)

## 2015-11-28 LAB — PROTIME-INR
INR: 1.2 ratio — ABNORMAL HIGH (ref 0.8–1.0)
PROTHROMBIN TIME: 12.1 s (ref 9.6–13.1)

## 2015-11-29 ENCOUNTER — Other Ambulatory Visit: Payer: Self-pay | Admitting: *Deleted

## 2015-11-29 LAB — AFP TUMOR MARKER: AFP TUMOR MARKER: 5.9 ng/mL (ref <6.1)

## 2015-11-29 NOTE — Telephone Encounter (Signed)
Last f/u 10/2015-CPE. Pt advised of 2business day response for all medications

## 2015-11-30 ENCOUNTER — Other Ambulatory Visit: Payer: Self-pay | Admitting: *Deleted

## 2015-11-30 ENCOUNTER — Encounter: Payer: Self-pay | Admitting: Internal Medicine

## 2015-11-30 MED ORDER — HYDROCODONE-ACETAMINOPHEN 5-325 MG PO TABS: ORAL_TABLET | ORAL | Status: DC

## 2015-11-30 NOTE — Progress Notes (Signed)
Quick Note:  Labs ok My Chart ______

## 2015-11-30 NOTE — Telephone Encounter (Signed)
Pt left voicemail at Triage checking status of refill request

## 2015-11-30 NOTE — Telephone Encounter (Signed)
Pt notified Rx ready for pickup 

## 2015-12-04 ENCOUNTER — Ambulatory Visit (HOSPITAL_COMMUNITY)
Admission: RE | Admit: 2015-12-04 | Discharge: 2015-12-04 | Disposition: A | Discharge status: Home / Self Care | Payer: 59 | Source: Ambulatory Visit | Attending: Internal Medicine | Admitting: Internal Medicine

## 2015-12-04 DIAGNOSIS — R188 Other ascites: Secondary | ICD-10-CM

## 2015-12-04 DIAGNOSIS — K7031 Alcoholic cirrhosis of liver with ascites: Secondary | ICD-10-CM | POA: Insufficient documentation

## 2015-12-04 DIAGNOSIS — Z9049 Acquired absence of other specified parts of digestive tract: Secondary | ICD-10-CM | POA: Diagnosis not present

## 2015-12-04 NOTE — Progress Notes (Signed)
Quick Note:  My chart note sent to explain to the patient by me No liver masses, small ascites Needs repeat ultrasound 6 months, please place reminder ______

## 2015-12-06 ENCOUNTER — Encounter: Payer: Self-pay | Admitting: Internal Medicine

## 2015-12-14 ENCOUNTER — Encounter: Payer: Self-pay | Admitting: Internal Medicine

## 2015-12-16 ENCOUNTER — Encounter: Payer: Self-pay | Admitting: Internal Medicine

## 2015-12-16 DIAGNOSIS — R188 Other ascites: Secondary | ICD-10-CM

## 2015-12-18 MED ORDER — SPIRONOLACTONE 50 MG PO TABS: 50.0000 mg | ORAL_TABLET | Freq: Every day | ORAL | Status: DC

## 2015-12-18 MED ORDER — FUROSEMIDE 40 MG PO TABS: 40.0000 mg | ORAL_TABLET | Freq: Every day | ORAL | Status: DC

## 2015-12-28 ENCOUNTER — Other Ambulatory Visit: Payer: Self-pay

## 2015-12-28 MED ORDER — HYDROCODONE-ACETAMINOPHEN 5-325 MG PO TABS: ORAL_TABLET | ORAL | Status: DC

## 2015-12-28 NOTE — Telephone Encounter (Signed)
Pt left v/m requesting rx hydrocodone apap. Call when ready for pick up. rx last printed # 90 on 11/30/15. Last annual 10/18/15.

## 2015-12-28 NOTE — Telephone Encounter (Signed)
Advised pt rx was up front for pickup

## 2016-01-17 ENCOUNTER — Other Ambulatory Visit: Payer: Self-pay

## 2016-01-17 MED ORDER — MIRTAZAPINE 45 MG PO TABS: 45.0000 mg | ORAL_TABLET | Freq: Every day | ORAL | Status: DC

## 2016-01-17 NOTE — Telephone Encounter (Signed)
Last filled 08-28-15 #90 Last OV 10-18-15 Next OV 10-18-16

## 2016-01-17 NOTE — Telephone Encounter (Signed)
Approved: can refill for a year 

## 2016-01-25 ENCOUNTER — Other Ambulatory Visit: Payer: Self-pay

## 2016-01-25 NOTE — Telephone Encounter (Signed)
Pt left v/m requesting rx hydrocodone apap. Call when ready for pick up. rx last printed # 90 on 12/28/15. Last seen 10/18/15. Pt request to pick up rx on 01/26/16.

## 2016-01-26 MED ORDER — HYDROCODONE-ACETAMINOPHEN 5-325 MG PO TABS: ORAL_TABLET | ORAL | Status: DC

## 2016-01-26 NOTE — Telephone Encounter (Signed)
Spoke to pt. Rx up front. 

## 2016-02-27 ENCOUNTER — Other Ambulatory Visit: Payer: Self-pay

## 2016-02-27 NOTE — Telephone Encounter (Signed)
Pt left v/m requesting rx hydrocodone apap. Call when ready for pick up. Last printed # 90 on 01/26/16; last annual on 10/18/15

## 2016-02-27 NOTE — Telephone Encounter (Signed)
Pt left v/m requesting cb when hydrocodone apap is ready for pick up.

## 2016-02-28 ENCOUNTER — Other Ambulatory Visit: Payer: Self-pay | Admitting: Internal Medicine

## 2016-02-28 MED ORDER — HYDROCODONE-ACETAMINOPHEN 5-325 MG PO TABS: ORAL_TABLET | ORAL | 0 refills | Status: DC

## 2016-02-28 NOTE — Telephone Encounter (Signed)
Pt was notified that rx is ready for pick up. Placed at front desk.

## 2016-02-29 NOTE — Telephone Encounter (Signed)
CMA that worked with Dr. Silvio Pate placed Rx at the front and pt picked it up

## 2016-03-27 ENCOUNTER — Other Ambulatory Visit: Payer: Self-pay

## 2016-03-27 NOTE — Telephone Encounter (Signed)
Pt left v/m requesting rx hydrocodone apap. Call when ready for pick up. rx last printed # 90 on 02/28/16. Last annual 10/18/15. Pt request to pick up on 03/28/16 or 03/29/16 in AM. Pt going out of town 03/29/16 in the afternoon.

## 2016-03-28 MED ORDER — HYDROCODONE-ACETAMINOPHEN 5-325 MG PO TABS: ORAL_TABLET | ORAL | 0 refills | Status: DC

## 2016-03-28 NOTE — Telephone Encounter (Signed)
Spoke to pt. Rx up front for pickup 

## 2016-04-24 ENCOUNTER — Encounter: Payer: Self-pay | Admitting: Internal Medicine

## 2016-04-24 ENCOUNTER — Ambulatory Visit (INDEPENDENT_AMBULATORY_CARE_PROVIDER_SITE_OTHER): Payer: 59 | Admitting: Internal Medicine

## 2016-04-24 VITALS — BP 126/80 | HR 67 | Temp 98.2°F | Wt 179.0 lb

## 2016-04-24 DIAGNOSIS — Z23 Encounter for immunization: Secondary | ICD-10-CM

## 2016-04-24 DIAGNOSIS — M79605 Pain in left leg: Secondary | ICD-10-CM

## 2016-04-24 DIAGNOSIS — M5137 Other intervertebral disc degeneration, lumbosacral region: Secondary | ICD-10-CM | POA: Diagnosis not present

## 2016-04-24 MED ORDER — HYDROCODONE-ACETAMINOPHEN 5-325 MG PO TABS: ORAL_TABLET | ORAL | 0 refills | Status: DC

## 2016-04-24 NOTE — Addendum Note (Signed)
Addended by: Pilar Grammes on: 04/24/2016 04:41 PM   Modules accepted: Orders

## 2016-04-24 NOTE — Progress Notes (Signed)
Pre visit review using our clinic review tool, if applicable. No additional management support is needed unless otherwise documented below in the visit note. 

## 2016-04-24 NOTE — Progress Notes (Signed)
Subjective:    Patient ID: Billy [name], male    DOB: Dec 05, 1945, 70 y.o.   MRN: [id number]  HPI Here due to left leg pain No injury--- didn't feel like sciatica Started about a week ago  Notes the pain with standing mostly-- also with walking Sitting and lying are okay Some swelling--but better in AM Seems to be better over the past few days  Has kept leg elevated Held off on golf and walking exercise--this may have helped it Taking regular pain meds--nothing else Hydrocodone doesn't help leg for long  Current Outpatient Prescriptions on File Prior to Visit  Medication Sig Dispense Refill  . Cholecalciferol (VITAMIN D3) 2000 UNITS TABS Take 2,000 Units by mouth daily.    . furosemide (LASIX) 40 MG tablet Take 1 tablet (40 mg total) by mouth daily. 90 tablet 1  . HYDROcodone-acetaminophen (NORCO/VICODIN) 5-325 MG tablet TAKE 1 TABLET BY MOUTH 3 TIMES A DAY AS NEEDED 90 tablet 0  . KRILL OIL PO Take 1 capsule by mouth every evening. 350 mg    . mirtazapine (REMERON) 45 MG tablet Take 1 tablet (45 mg total) by mouth at bedtime. 90 tablet 3  . Multiple Vitamin (MULTIVITAMIN WITH MINERALS) TABS Take 1 tablet by mouth daily.    . nadolol (CORGARD) 40 MG tablet TAKE 1 TABLET (40 MG TOTAL) BY MOUTH DAILY. 90 tablet 3  . omeprazole (PRILOSEC) 40 MG capsule TAKE 1 CAPSULE (40 MG TOTAL) BY MOUTH DAILY. 90 capsule 3  . psyllium (METAMUCIL) 58.6 % powder Take 1 packet by mouth daily.     Marland Kitchen spironolactone (ALDACTONE) 50 MG tablet Take 1 tablet (50 mg total) by mouth daily. 90 tablet 1  . vitamin E (VITAMIN E) 400 UNIT capsule Take 400 Units by mouth daily.     No current facility-administered medications on file prior to visit.     Allergies  Allergen Reactions  . Bee Venom     Yellow Jackets   . Nsaids Other (See Comments)    Stomach pain  . Other     Blood thinners reaction unknown    Past Medical History:  Diagnosis Date  . Acute posthemorrhagic anemia   . Alcoholic  cirrhosis of liver ([place]) 09/24/2011   Years of alcohol use. Progressed from fatty liver to cirrhosis based upon review imaging. Manifestations are portal hypertension with esophageal varices, portal gastropathy, rectal varices and he may have portal colopathy. Ascites also present. Naive to HAV and HBV Claims he has had pneumococcal vaccine   . Alcoholism ([place])   . Angiodysplasia of colon 09/11/2011   Multiple in the right colon, ablated with APC. Question if this is portal colopathy.   . Back pain   . Basal cell carcinoma   . Bilateral varicoceles   . Campylobacter diarrhea 01/07/2012  . DDD (degenerative disc disease)   . Depression    mild at most  . Diverticulosis of colon   . Erectile dysfunction   . Gastric ulcer 08/2011   small, antral ulcer  . GERD (gastroesophageal reflux disease)    takes Omeprazole prn  . GI hemorrhage 2006, 2013   esophagitis and 1+ varices, melena in 2013   . History of blood transfusion    as a child and no abnormal reaction noted  . History of bronchitis    >73yrs ago  . Hydrocele   . Hypertension    takes Nadolol daily  . Personal history of colonic polyps   . Portal hypertensive gastropathy  08/2011  . 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 Scrotal edema   . Sleep disturbance    takes Remeron nightly  . Thrombocytopenia, unspecified   . 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 CATARACT EXTRACTION  2013   bilateral with IOL  . CHOLECYSTECTOMY  12/31/2012   Procedure: LAPAROSCOPIC CHOLECYSTECTOMY;  Surgeon: Zenovia Jarred, MD;  Location: [facility];  Service: General;;  . COLONOSCOPY  2001, 200411/02/2006  . COLONOSCOPY  09/11/2011   Procedure: COLONOSCOPY;  Surgeon: Gatha Mayer, MD;  Location: [facility];  Service: Endoscopy;  Laterality: N/A;  . ESOPHAGOGASTRODUODENOSCOPY  2006  . ESOPHAGOGASTRODUODENOSCOPY  09/11/2011   Procedure: ESOPHAGOGASTRODUODENOSCOPY (EGD);  Surgeon: Gatha Mayer, MD;  Location: [facility];  Service: Endoscopy;  Laterality: N/A;  . EYE SURGERY    . FLEXIBLE SIGMOIDOSCOPY  01/07/2012   Procedure: FLEXIBLE SIGMOIDOSCOPY;  Surgeon: Lafayette Dragon, MD;  Location: [facility];  Service: Endoscopy;  Laterality: N/A;  . KNEE ARTHROSCOPY  2008   Right  . KNEE ARTHROSCOPY  2008   Left  . [place] THORA/PARACENTESIS  10/28/2011  . Varicocele repair  74's    Family History  Problem Relation Age of Onset  . Stroke Mother   . Diabetes Mother   . Colon cancer Neg Hx     Social History   Social History  . Marital status: Married    Spouse name: N/A  . Number of children: 1  . Years of education: N/A   Occupational History  . Retired     mostly from [employer]   Social History Main Topics  . Smoking status: Former Smoker    Packs/day: 0.50    Types: Cigarettes  . Smokeless tobacco: Never Used     Comment: quit a couple of months ago  . Alcohol use No  . Drug use: No  . Sexual activity: No   Other Topics Concern  . Not on file   Social History Narrative   Married- 2nd   1 son (adopted) from previous marriage      No living will   Wife should be health care POA   Would accept attempts at resuscitation   No tube feeds if cognitively unaware         Review of Systems Weight is down with increased diuretics--now stable No chest pain or SOB    Objective:   Physical Exam  Constitutional: He appears well-developed. No distress.  Cardiovascular: Normal rate, regular rhythm, normal heart sounds and intact distal pulses.  Exam reveals no gallop.   No murmur heard. Pulmonary/Chest: Effort normal and breath sounds normal. No respiratory distress. He has no wheezes. He has no rales.  Musculoskeletal:  No calf swelling or pitting No tenderness Some vein dilation--right more than left  Neurological:  Normal gait No leg weakness          Assessment & Plan:

## 2016-04-24 NOTE — Addendum Note (Signed)
Addended by: Viviana Simpler I on: 04/24/2016 10:40 AM   Modules accepted: Orders

## 2016-04-24 NOTE — Assessment & Plan Note (Signed)
Stable narcotic use Checked CSRS--no other prescribers

## 2016-04-24 NOTE — Assessment & Plan Note (Signed)
This certainly could be spinal stenosis Also possible to have some venous insufficiency and phlebitis No sig findings Discussed trying support socks to see if that helps Would not consider surgery if spinal stenosis--so no rush for MRI

## 2016-04-25 ENCOUNTER — Telehealth: Payer: Self-pay

## 2016-04-25 ENCOUNTER — Encounter: Payer: Self-pay | Admitting: Internal Medicine

## 2016-04-25 ENCOUNTER — Other Ambulatory Visit: Payer: Self-pay | Admitting: Internal Medicine

## 2016-04-25 DIAGNOSIS — K7030 Alcoholic cirrhosis of liver without ascites: Secondary | ICD-10-CM

## 2016-04-25 NOTE — Telephone Encounter (Signed)
Refill x 2 Have him schedule a f/u please - not urgent

## 2016-04-25 NOTE — Telephone Encounter (Signed)
May I refill Sir, thank you. 

## 2016-04-25 NOTE — Telephone Encounter (Signed)
Set up patients complete abdominal U/S for 06/10/16 at 9:30AM, arrive at [facility] at 9:15AM, NPO midnight. Patient aware.  Last U/S done in May and he was to repeat in 6 months per Dr Carlean Purl, dx- cirrhosis.

## 2016-04-25 NOTE — Telephone Encounter (Signed)
Refilled rx for Nadolol and called patient and set up December appointment.  He reminded me that he gets an u/s done prior to the appointment. I'll set that up and call him back with the date and time.

## 2016-05-10 ENCOUNTER — Telehealth: Payer: Self-pay | Admitting: Internal Medicine

## 2016-05-10 NOTE — Telephone Encounter (Signed)
Pt dropped off health screening form to be filled out for [facility]. Please fax when ready and call for pick up. Pt is aware of the delay.

## 2016-05-13 NOTE — Telephone Encounter (Signed)
Spoke to pt. He will come in sometime today and sign his form. I have it at my desk right now with a sitcker where he needs to sign it.

## 2016-05-16 NOTE — Telephone Encounter (Signed)
I have completed the form and faxed it back

## 2016-05-22 ENCOUNTER — Other Ambulatory Visit: Payer: Self-pay

## 2016-05-22 NOTE — Telephone Encounter (Signed)
Pt left v/m requesting rx hydrocodone apap. Call when ready for pick up. Pt last seen and rx last printed # 90 on 04/24/16; pt request to pick up on 05/24/16.

## 2016-05-23 MED ORDER — HYDROCODONE-ACETAMINOPHEN 5-325 MG PO TABS: ORAL_TABLET | ORAL | 0 refills | Status: DC

## 2016-05-23 NOTE — Telephone Encounter (Signed)
Spoke to pt. Rx up front ready for pickup 

## 2016-05-27 ENCOUNTER — Ambulatory Visit (INDEPENDENT_AMBULATORY_CARE_PROVIDER_SITE_OTHER): Payer: 59 | Admitting: Internal Medicine

## 2016-05-27 ENCOUNTER — Encounter: Payer: Self-pay | Admitting: Internal Medicine

## 2016-05-27 ENCOUNTER — Other Ambulatory Visit (INDEPENDENT_AMBULATORY_CARE_PROVIDER_SITE_OTHER): Payer: 59

## 2016-05-27 DIAGNOSIS — R188 Other ascites: Secondary | ICD-10-CM

## 2016-05-27 DIAGNOSIS — N5089 Other specified disorders of the male genital organs: Secondary | ICD-10-CM | POA: Diagnosis not present

## 2016-05-27 DIAGNOSIS — K7031 Alcoholic cirrhosis of liver with ascites: Secondary | ICD-10-CM

## 2016-05-27 LAB — COMPREHENSIVE METABOLIC PANEL
ALBUMIN: 3.5 g/dL (ref 3.5–5.2)
ALT: 15 U/L (ref 0–53)
AST: 33 U/L (ref 0–37)
Alkaline Phosphatase: 91 U/L (ref 39–117)
BUN: 10 mg/dL (ref 6–23)
CHLORIDE: 94 meq/L — AB (ref 96–112)
CO2: 37 meq/L — AB (ref 19–32)
CREATININE: 0.8 mg/dL (ref 0.40–1.50)
Calcium: 9.3 mg/dL (ref 8.4–10.5)
GFR: 102.12 mL/min (ref >60.00)
Glucose, Bld: 112 mg/dL — ABNORMAL HIGH (ref 70–99)
POTASSIUM: 3.6 meq/L (ref 3.5–5.1)
SODIUM: 137 meq/L (ref 135–145)
Total Bilirubin: 1.3 mg/dL — ABNORMAL HIGH (ref 0.2–1.2)
Total Protein: 7.5 g/dL (ref 6.0–8.3)

## 2016-05-27 LAB — CBC WITH DIFFERENTIAL/PLATELET
BASOS PCT: 0.3 % (ref 0.0–3.0)
Basophils Absolute: 0 10*3/uL (ref 0.0–0.1)
EOS ABS: 0.1 10*3/uL (ref 0.0–0.7)
EOS PCT: 2.1 % (ref 0.0–5.0)
HCT: 40.6 % (ref 39.0–52.0)
Hemoglobin: 13.8 g/dL (ref 13.0–17.0)
LYMPHS ABS: 1.1 10*3/uL (ref 0.7–4.0)
Lymphocytes Relative: 17.5 % (ref 12.0–46.0)
MCHC: 33.9 g/dL (ref 30.0–36.0)
MCV: 99.2 fl (ref 78.0–100.0)
MONO ABS: 0.7 10*3/uL (ref 0.1–1.0)
Monocytes Relative: 10.7 % (ref 3.0–12.0)
NEUTROS ABS: 4.5 10*3/uL (ref 1.4–7.7)
Neutrophils Relative %: 69.4 % (ref 43.0–77.0)
PLATELETS: 149 10*3/uL — AB (ref 150.0–400.0)
RBC: 4.1 Mil/uL — ABNORMAL LOW (ref 4.22–5.81)
RDW: 14.1 % (ref 11.5–15.5)
WBC: 6.4 10*3/uL (ref 4.0–10.5)

## 2016-05-27 LAB — PROTIME-INR
INR: 1.2 ratio — AB (ref 0.8–1.0)
PROTHROMBIN TIME: 12.2 s (ref 9.6–13.1)

## 2016-05-27 NOTE — Progress Notes (Signed)
   Billy [name] 70 y.o. 02-Apr-1946 [id number]  Assessment & Plan:   1. Alcoholic cirrhosis of liver with ascites (H[facility])   2. Other ascites   3. Scrotal edema    Overall he seems to be doing well and improved. We'll recheck labs with CBC seem at pro-time and alpha-fetoprotein regarding cirrhosis, diuretic management follow-up and ascites.  He has a scheduled ultrasound routine screening for hepatocellular carcinoma the end of this month.  Anticipate seeing him again in 6 months routinely. He has done well overall and I hope he continues to do so.  I appreciate the opportunity to care for this patient. CC: Viviana Simpler, MD Dr. Preston Fleeting  Subjective:   Chief Complaint: Follow-up cirrhosis and ascites  HPI The patient was last seen in May, at which point I increase his diuretics. He has noted a weight loss reduced abdominal girth and less scrotal edema associated with his hydrocele. I also recommended some cream to be used to prevent chafing and that has helped. He is playing golf and active and feels well and is happy with the quality of his life. He has not had any confusion or excessive sleepiness.  Medications, allergies, past medical history, past surgical history, family history and social history are reviewed and updated in the EMR.  Review of Systems As above and he does notice being a little lightheaded when he's been sitting in chair for a long time at night and arises.  Objective:   Physical Exam BP 126/70   Pulse 72   Ht 6\' 2"  (1.88 m)   Wt 182 lb 2 oz (82.6 kg)   BMI 23.38 kg/m  No acute distress Eyes are anicteric Lungs Are clear S1-S2 no rubs murmurs or gallops on heart exam Abdomen is mildly protuberant with obvious ascites or hepatosplenomegaly today. There is some mild prominence of the venous structures in the abdominal wall. Extremities are without cyanosis clubbing or edema He is alert and oriented 3 there is no obvious asterixis

## 2016-05-27 NOTE — Assessment & Plan Note (Signed)
Better by report

## 2016-05-27 NOTE — Assessment & Plan Note (Signed)
Improved

## 2016-05-27 NOTE — Patient Instructions (Addendum)
Your physician has requested that you go to the basement for lab work before leaving today.  Please follow up with Dr. Carlean Purl in 6 months.     I appreciate the opportunity to care for you. Silvano Rusk, MD, [facility]

## 2016-05-27 NOTE — Assessment & Plan Note (Signed)
Better/stable

## 2016-05-28 ENCOUNTER — Telehealth: Payer: Self-pay | Admitting: Internal Medicine

## 2016-05-28 LAB — AFP TUMOR MARKER: AFP TUMOR MARKER: 5.7 ng/mL (ref <6.1)

## 2016-05-28 NOTE — Progress Notes (Signed)
Labs overall ok Blood sugar mildly high for fasting - might be "pre-diabetic"  Otherwise look ok  My Chart note

## 2016-05-28 NOTE — Telephone Encounter (Signed)
Patient had appointment that was early Dec. And moved it up so we canceled that one.  He is follow up in 6 months.

## 2016-06-10 ENCOUNTER — Ambulatory Visit (HOSPITAL_COMMUNITY)
Admission: RE | Admit: 2016-06-10 | Discharge: 2016-06-10 | Disposition: A | Discharge status: Home / Self Care | Payer: 59 | Source: Ambulatory Visit | Attending: Internal Medicine | Admitting: Internal Medicine

## 2016-06-10 DIAGNOSIS — Z9049 Acquired absence of other specified parts of digestive tract: Secondary | ICD-10-CM | POA: Diagnosis not present

## 2016-06-10 DIAGNOSIS — K7030 Alcoholic cirrhosis of liver without ascites: Secondary | ICD-10-CM

## 2016-06-10 DIAGNOSIS — K7031 Alcoholic cirrhosis of liver with ascites: Secondary | ICD-10-CM | POA: Insufficient documentation

## 2016-06-10 NOTE — Progress Notes (Signed)
No liver tumors Still has some fluid in his abdomen = ascites but seems to be doing ok so no med changes Repeat RUQ [place] same reason 6 mos

## 2016-06-20 ENCOUNTER — Other Ambulatory Visit: Payer: Self-pay

## 2016-06-20 ENCOUNTER — Other Ambulatory Visit: Payer: Self-pay | Admitting: Internal Medicine

## 2016-06-20 DIAGNOSIS — R188 Other ascites: Secondary | ICD-10-CM

## 2016-06-20 NOTE — Telephone Encounter (Signed)
Refill x 6 mos 

## 2016-06-20 NOTE — Telephone Encounter (Signed)
How many refills okay Sir? Thank you. 

## 2016-06-20 NOTE — Telephone Encounter (Signed)
Pt left v/m requesting rx hydrocodone apap. Call when ready for pick up. Pt wants to pick up on 06/21/16. Last printed # 90 on 05/23/16. Last seen 04/24/16.

## 2016-06-21 MED ORDER — HYDROCODONE-ACETAMINOPHEN 5-325 MG PO TABS: ORAL_TABLET | ORAL | 0 refills | Status: DC

## 2016-06-21 NOTE — Telephone Encounter (Signed)
Spoke to pt. Rx up front ready for pickup 

## 2016-06-26 ENCOUNTER — Ambulatory Visit: Payer: 59 | Admitting: Internal Medicine

## 2016-07-19 ENCOUNTER — Other Ambulatory Visit: Payer: Self-pay

## 2016-07-19 MED ORDER — HYDROCODONE-ACETAMINOPHEN 5-325 MG PO TABS: ORAL_TABLET | ORAL | 0 refills | Status: DC

## 2016-07-19 NOTE — Telephone Encounter (Signed)
;  pt left a voicemail requesting a refill.  Last refill 06/21/16 #90, last OV 04/24/16. He said he can pick it up on Monday.

## 2016-07-19 NOTE — Telephone Encounter (Signed)
Printed and in Kmi's box. 

## 2016-07-19 NOTE — Telephone Encounter (Signed)
Forwarding to Dr Danise Mina in Dr Alla German absence

## 2016-07-22 NOTE — Telephone Encounter (Signed)
Spoke to pt. Rx up front ready for pickup 

## 2016-08-08 ENCOUNTER — Telehealth: Payer: Self-pay

## 2016-08-08 NOTE — Telephone Encounter (Signed)
We had received mail from [place] saying we needed to check the quantity limit exception for the patient's hydrocodone.  I verified he gets #90 for 30 days.  I went to the website they provided to check his formulary. QUANTITY LIMITS APPLY: 240 tablets per 25 days, 720 tablets per 75 days   He will be fine. No PA should have to be done.

## 2016-08-20 ENCOUNTER — Other Ambulatory Visit: Payer: Self-pay

## 2016-08-20 MED ORDER — HYDROCODONE-ACETAMINOPHEN 5-325 MG PO TABS: ORAL_TABLET | ORAL | 0 refills | Status: DC

## 2016-08-20 NOTE — Telephone Encounter (Signed)
Spoke to pt. Rx up front ready for pickup 

## 2016-08-20 NOTE — Telephone Encounter (Signed)
Pt left v/m requesting rx hydrocodone apap. Call when ready for pick up. rx last printed # 90 on 07/19/16. Last seen by Dr Silvio Pate 04/24/16.

## 2016-09-17 ENCOUNTER — Other Ambulatory Visit: Payer: Self-pay

## 2016-09-17 MED ORDER — HYDROCODONE-ACETAMINOPHEN 5-325 MG PO TABS: ORAL_TABLET | ORAL | 0 refills | Status: DC

## 2016-09-17 NOTE — Telephone Encounter (Signed)
Pt left v/m requesting rx hydrocodone apap. Call when ready for pick up. Last printed # 90 on 08/20/16. Pt last saw Dr Silvio Pate 04/24/16. Pt wants to pick up on 09/18/16 because pt will be out of town beginning 09/19/16.

## 2016-09-18 NOTE — Telephone Encounter (Signed)
Notified patient that Rx is ready to pick up. Left in the front office.

## 2016-10-16 ENCOUNTER — Other Ambulatory Visit: Payer: 59

## 2016-10-16 ENCOUNTER — Other Ambulatory Visit: Payer: Self-pay

## 2016-10-16 ENCOUNTER — Encounter: Payer: Self-pay | Admitting: Internal Medicine

## 2016-10-16 DIAGNOSIS — G8929 Other chronic pain: Secondary | ICD-10-CM

## 2016-10-16 DIAGNOSIS — M549 Dorsalgia, unspecified: Principal | ICD-10-CM

## 2016-10-16 MED ORDER — HYDROCODONE-ACETAMINOPHEN 5-325 MG PO TABS: ORAL_TABLET | ORAL | 0 refills | Status: DC

## 2016-10-16 NOTE — Telephone Encounter (Signed)
Pt left v/m requesting rx hydrocodone apap. Call when ready for pick up. rx last printed # 90 on 09/17/16. Last seen 04/24/16.

## 2016-10-16 NOTE — Telephone Encounter (Signed)
Spoke to pt. RX up front

## 2016-10-18 ENCOUNTER — Encounter: Payer: Self-pay | Admitting: Internal Medicine

## 2016-10-18 ENCOUNTER — Ambulatory Visit (INDEPENDENT_AMBULATORY_CARE_PROVIDER_SITE_OTHER): Payer: 59 | Admitting: Internal Medicine

## 2016-10-18 VITALS — BP 108/64 | HR 64 | Temp 98.2°F | Ht 74.0 in | Wt 182.0 lb

## 2016-10-18 DIAGNOSIS — F1021 Alcohol dependence, in remission: Secondary | ICD-10-CM | POA: Diagnosis not present

## 2016-10-18 DIAGNOSIS — M549 Dorsalgia, unspecified: Secondary | ICD-10-CM

## 2016-10-18 DIAGNOSIS — K7031 Alcoholic cirrhosis of liver with ascites: Secondary | ICD-10-CM | POA: Diagnosis not present

## 2016-10-18 DIAGNOSIS — Z0000 Encounter for general adult medical examination without abnormal findings: Secondary | ICD-10-CM

## 2016-10-18 DIAGNOSIS — D696 Thrombocytopenia, unspecified: Secondary | ICD-10-CM | POA: Diagnosis not present

## 2016-10-18 DIAGNOSIS — I10 Essential (primary) hypertension: Secondary | ICD-10-CM

## 2016-10-18 DIAGNOSIS — K766 Portal hypertension: Secondary | ICD-10-CM | POA: Diagnosis not present

## 2016-10-18 DIAGNOSIS — G8929 Other chronic pain: Secondary | ICD-10-CM

## 2016-10-18 NOTE — Progress Notes (Signed)
Pre visit review using our clinic review tool, if applicable. No additional management support is needed unless otherwise documented below in the visit note. 

## 2016-10-18 NOTE — Assessment & Plan Note (Signed)
Stable Gets routine ultrasounds

## 2016-10-18 NOTE — Assessment & Plan Note (Signed)
With sequelae---seems controlled now

## 2016-10-18 NOTE — Assessment & Plan Note (Signed)
Doing well No PSA after discussion Colon due 2023 Yearly flu vaccine Discussed --cut back and stop ecig (if possible)

## 2016-10-18 NOTE — Assessment & Plan Note (Signed)
Has slipped with occasional wine Counseled to stay abstinent

## 2016-10-18 NOTE — Progress Notes (Signed)
Subjective:    Patient ID: Billy [name], male    DOB: Nov 13, 1945, 71 y.o.   MRN: [id number]  HPI Here for physical  Keeps up with Dr Carlean Purl Cirrhosis no worse Regular ultrasound Ascites is better--spironolactone increased Abstinent with alcohol---- except rare wine Still uses the ecigarettes  No bleeding problems No urine, stool, oral bleeding  Chronic back pain continues Doing okay with current hydrocodone   Current Outpatient Prescriptions on File Prior to Visit  Medication Sig Dispense Refill  . Cholecalciferol (VITAMIN D3) 2000 UNITS TABS Take 2,000 Units by mouth daily.    . furosemide (LASIX) 40 MG tablet TAKE 1 TABLET (40 MG TOTAL) BY MOUTH DAILY. 90 tablet 1  . HYDROcodone-acetaminophen (NORCO/VICODIN) 5-325 MG tablet TAKE 1 TABLET BY MOUTH 3 TIMES A DAY AS NEEDED 90 tablet 0  . KRILL OIL PO Take 1 capsule by mouth every evening. 350 mg    . mirtazapine (REMERON) 45 MG tablet Take 1 tablet (45 mg total) by mouth at bedtime. 90 tablet 3  . Multiple Vitamin (MULTIVITAMIN WITH MINERALS) TABS Take 1 tablet by mouth daily.    . nadolol (CORGARD) 40 MG tablet TAKE 1 TABLET (40 MG TOTAL) BY MOUTH DAILY. 90 tablet 1  . omeprazole (PRILOSEC) 40 MG capsule TAKE 1 CAPSULE (40 MG TOTAL) BY MOUTH DAILY. 90 capsule 3  . PREVIDENT 5000 DRY MOUTH 1.1 % GEL dental gel as directed.    . psyllium (METAMUCIL) 58.6 % powder Take 1 packet by mouth daily.     Marland Kitchen spironolactone (ALDACTONE) 50 MG tablet TAKE 1 TABLET (50 MG TOTAL) BY MOUTH DAILY. 90 tablet 1  . vitamin C (ASCORBIC ACID) 500 MG tablet Take 500 mg by mouth daily.     No current facility-administered medications on file prior to visit.     Allergies  Allergen Reactions  . Bee Venom     Yellow Jackets   . Nsaids Other (See Comments)    Stomach pain  . Other     Blood thinners reaction unknown    Past Medical History:  Diagnosis Date  . Acute posthemorrhagic anemia   . Alcoholic cirrhosis of liver ([place]) 09/24/2011     Years of alcohol use. Progressed from fatty liver to cirrhosis based upon review imaging. Manifestations are portal hypertension with esophageal varices, portal gastropathy, rectal varices and he may have portal colopathy. Ascites also present. Naive to HAV and HBV Claims he has had pneumococcal vaccine   . Alcoholism ([place])   . Angiodysplasia of colon 09/11/2011   Multiple in the right colon, ablated with APC. Question if this is portal colopathy.   . Back pain   . Basal cell carcinoma   . Bilateral varicoceles   . Campylobacter diarrhea 01/07/2012  . DDD (degenerative disc disease)   . Depression    mild at most  . Diverticulosis of colon   . Erectile dysfunction   . Gastric ulcer 08/2011   small, antral ulcer  . GERD (gastroesophageal reflux disease)    takes Omeprazole prn  . GI hemorrhage 2006, 2013   esophagitis and 1+ varices, melena in 2013   . History of blood transfusion    as a child and no abnormal reaction noted  . History of bronchitis    >110yrs ago  . Hydrocele   . Hypertension    takes Nadolol daily  . Personal history of colonic polyps   . 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 Scrotal  edema   . Sleep disturbance    takes Remeron nightly  . Thrombocytopenia, unspecified   . 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 CATARACT EXTRACTION  2013   bilateral with IOL  . CHOLECYSTECTOMY  12/31/2012   Procedure: LAPAROSCOPIC CHOLECYSTECTOMY;  Surgeon: Zenovia Jarred, MD;  Location: [facility];  Service: General;;  . COLONOSCOPY  2001, 200411/02/2006  . COLONOSCOPY  09/11/2011   Procedure: COLONOSCOPY;  Surgeon: Gatha Mayer, MD;  Location: [facility];  Service: Endoscopy;  Laterality: N/A;  . ESOPHAGOGASTRODUODENOSCOPY  2006  . ESOPHAGOGASTRODUODENOSCOPY  09/11/2011   Procedure: ESOPHAGOGASTRODUODENOSCOPY (EGD);  Surgeon: Gatha Mayer, MD;  Location: [facility];  Service: Endoscopy;  Laterality: N/A;  . EYE SURGERY    . FLEXIBLE SIGMOIDOSCOPY  01/07/2012   Procedure: FLEXIBLE SIGMOIDOSCOPY;  Surgeon: Lafayette Dragon, MD;  Location: [facility];  Service: Endoscopy;  Laterality: N/A;  . KNEE ARTHROSCOPY  2008   Right  . KNEE ARTHROSCOPY  2008   Left  . [place] THORA/PARACENTESIS  10/28/2011  . Varicocele repair  61's    Family History  Problem Relation Age of Onset  . Stroke Mother   . Diabetes Mother   . Colon cancer Neg Hx     Social History   Social History  . Marital status: Married    Spouse name: N/A  . Number of children: 1  . Years of education: N/A   Occupational History  . Retired     mostly from [employer]   Social History Main Topics  . Smoking status: Current Some Day Smoker    Packs/day: 0.50    Types: Cigarettes  . Smokeless tobacco: Never Used     Comment: current using E cigarette, also  . Alcohol use No  . Drug use: No  . Sexual activity: No   Other Topics Concern  . Not on file   Social History Narrative   Married- 2nd   1 son (adopted) from previous marriage      No living will   Wife should be health care POA   Would accept attempts at resuscitation   No tube feeds if cognitively unaware         Review of Systems  Constitutional: Negative for fatigue and unexpected weight change.       Wears seat belt  HENT: Positive for tinnitus. Negative for dental problem, hearing loss and trouble swallowing.        Keeps up with dentist  Eyes: Negative for visual disturbance.       No diplopia or unilateral vision loss  Respiratory: Negative for cough, chest tightness and shortness of breath.   Cardiovascular: Negative for chest pain, palpitations and leg swelling.       Wears support socks  Gastrointestinal: Negative for blood in stool, constipation and nausea.       No heartburn (unless he forgets med)  Endocrine: Negative for polydipsia and polyuria.  Genitourinary: Positive for frequency.  Negative for difficulty urinating and urgency.       ED-- not important Still with hydrocele  Musculoskeletal: Positive for arthralgias and back pain. Negative for joint swelling.  Skin: Negative for rash.       No suspicious lesions  Allergic/Immunologic: Positive for environmental allergies. Negative for immunocompromised state.       No meds  Neurological: Negative for dizziness, syncope, light-headedness and headaches.  Hematological: Negative for adenopathy. Does  not bruise/bleed easily.  Psychiatric/Behavioral: Negative for dysphoric mood and sleep disturbance. The patient is nervous/anxious.        Objective:   Physical Exam  Constitutional: He is oriented to person, place, and time. He appears well-developed and well-nourished. No distress.  HENT:  Head: Normocephalic and atraumatic.  Right Ear: External ear normal.  Left Ear: External ear normal.  Mouth/Throat: Oropharynx is clear and moist.  Eyes: Conjunctivae are normal.  Left pupil persistently dilated  Neck: Normal range of motion. No thyromegaly present.  Cardiovascular: Normal rate, regular rhythm, normal heart sounds and intact distal pulses.  Exam reveals no gallop.   No murmur heard. Pulmonary/Chest: Effort normal and breath sounds normal. No respiratory distress. He has no wheezes. He has no rales.  Abdominal: Soft. He exhibits distension. There is no tenderness. There is no rebound and no guarding.  Mild ascites  Musculoskeletal: He exhibits no edema or tenderness.  Lymphadenopathy:    He has no cervical adenopathy.  Neurological: He is alert and oriented to person, place, and time.  Skin:  Chronic ecchymoses in feet  Psychiatric: He has a normal mood and affect. His behavior is normal.          Assessment & Plan:

## 2016-10-18 NOTE — Assessment & Plan Note (Signed)
Doing well with hydrocodone regimen Checked CSRS--nothing other than here

## 2016-10-18 NOTE — Assessment & Plan Note (Signed)
BP Readings from Last 3 Encounters:  10/18/16 108/64  05/27/16 126/70  04/24/16 126/80   Good control

## 2016-10-18 NOTE — Assessment & Plan Note (Signed)
Mild with the cirrhosis

## 2016-10-20 LAB — TOXASSURE SELECT 13 (MW), URINE

## 2016-10-23 ENCOUNTER — Other Ambulatory Visit: Payer: Self-pay | Admitting: Internal Medicine

## 2016-10-23 NOTE — Telephone Encounter (Signed)
Please advise Sir? 

## 2016-10-24 NOTE — Telephone Encounter (Signed)
Refill x 1 year 

## 2016-10-30 ENCOUNTER — Encounter: Payer: Self-pay | Admitting: Internal Medicine

## 2016-11-13 ENCOUNTER — Other Ambulatory Visit: Payer: Self-pay

## 2016-11-13 MED ORDER — HYDROCODONE-ACETAMINOPHEN 5-325 MG PO TABS: ORAL_TABLET | ORAL | 0 refills | Status: DC

## 2016-11-13 NOTE — Telephone Encounter (Signed)
Spoke to pt. Rx up front ready for pickup 

## 2016-11-13 NOTE — Telephone Encounter (Signed)
Pt left v/m requesting rx hydrocodone apap. Call when ready for pick up. Last printed # 90 on 10/16/16; last annual 10/18/16. Pt request to pick up 11/13/16 because going out of town 11/14/16 thru the weekend.

## 2016-11-20 ENCOUNTER — Other Ambulatory Visit: Payer: Self-pay | Admitting: Internal Medicine

## 2016-11-25 ENCOUNTER — Ambulatory Visit (INDEPENDENT_AMBULATORY_CARE_PROVIDER_SITE_OTHER): Payer: 59 | Admitting: Internal Medicine

## 2016-11-25 ENCOUNTER — Encounter: Payer: Self-pay | Admitting: Internal Medicine

## 2016-11-25 ENCOUNTER — Other Ambulatory Visit (INDEPENDENT_AMBULATORY_CARE_PROVIDER_SITE_OTHER): Payer: 59

## 2016-11-25 DIAGNOSIS — K7031 Alcoholic cirrhosis of liver with ascites: Secondary | ICD-10-CM

## 2016-11-25 LAB — COMPREHENSIVE METABOLIC PANEL
ALBUMIN: 3.7 g/dL (ref 3.5–5.2)
ALK PHOS: 104 U/L (ref 39–117)
ALT: 18 U/L (ref 0–53)
AST: 39 U/L — AB (ref 0–37)
BILIRUBIN TOTAL: 1.6 mg/dL — AB (ref 0.2–1.2)
BUN: 9 mg/dL (ref 6–23)
CO2: 36 mEq/L — ABNORMAL HIGH (ref 19–32)
Calcium: 9.6 mg/dL (ref 8.4–10.5)
Chloride: 97 mEq/L (ref 96–112)
Creatinine, Ser: 0.8 mg/dL (ref 0.40–1.50)
GFR: 101.97 mL/min (ref >60.00)
GLUCOSE: 116 mg/dL — AB (ref 70–99)
POTASSIUM: 4.4 meq/L (ref 3.5–5.1)
SODIUM: 136 meq/L (ref 135–145)
TOTAL PROTEIN: 7.8 g/dL (ref 6.0–8.3)

## 2016-11-25 LAB — CBC WITH DIFFERENTIAL/PLATELET
Basophils Absolute: 0.1 10*3/uL (ref 0.0–0.1)
Basophils Relative: 1 % (ref 0.0–3.0)
EOS PCT: 2.2 % (ref 0.0–5.0)
Eosinophils Absolute: 0.1 10*3/uL (ref 0.0–0.7)
HEMATOCRIT: 43.4 % (ref 39.0–52.0)
HEMOGLOBIN: 14.8 g/dL (ref 13.0–17.0)
LYMPHS ABS: 0.8 10*3/uL (ref 0.7–4.0)
LYMPHS PCT: 16 % (ref 12.0–46.0)
MCHC: 34 g/dL (ref 30.0–36.0)
MCV: 100.8 fl — AB (ref 78.0–100.0)
Monocytes Absolute: 0.6 10*3/uL (ref 0.1–1.0)
Monocytes Relative: 12 % (ref 3.0–12.0)
NEUTROS PCT: 68.8 % (ref 43.0–77.0)
Neutro Abs: 3.6 10*3/uL (ref 1.4–7.7)
Platelets: 149 10*3/uL — ABNORMAL LOW (ref 150.0–400.0)
RBC: 4.31 Mil/uL (ref 4.22–5.81)
RDW: 14.1 % (ref 11.5–15.5)
WBC: 5.3 10*3/uL (ref 4.0–10.5)

## 2016-11-25 LAB — PROTIME-INR
INR: 1.1 ratio — AB (ref 0.8–1.0)
Prothrombin Time: 12.1 s (ref 9.6–13.1)

## 2016-11-25 NOTE — Progress Notes (Signed)
   Billy [name] 71 y.o. 1946-06-09 [id number]  Assessment & Plan:   Encounter Diagnoses  Name Primary?  . Alcoholic cirrhosis of liver with ascites ([place])   . Ascites due to alcoholic cirrhosis ([facility])    Alcoholic cirrhosis of liver Stable to improved   Ascites better  He will continue current therapy. See me in 6 months. Routine lab checks with CBC she met alpha-fetoprotein and ProTime today. Schedule screening ultrasound.  I appreciate the opportunity to care for this patient. CC: [name], MD   Subjective:   Chief Complaint: Follow-up of cirrhosis and ascites she feels well today  HPI The patient reports that he is feeling well. He is here unaccompanied, stating that the diuretic changes I made last year reduced his ascites and his scrotum is not bothered so much with the swelling. It still swells somebody doesn't get the symptoms of chafing etc. He uses proper supportive underwear and some anti-chafing cream. Otherwise he is doing well.  Medications, allergies, past medical history, past surgical history, family history and social history are reviewed and updated in the EMR.  Review of Systems As above  Objective:   Physical Exam '@BP'$  (!) 100/56   Pulse 70   Ht '6\' 2"'$  (1.88 m)   Wt 171 lb 12.8 oz (77.9 kg)   BMI 22.06 kg/m @  General:  NAD Eyes:   anicteric Lungs:  clear Heart::  S1S2 no rubs, murmurs or gallops Abdomen:  soft and nontender, BS+ mod ascites Ext:   no edema, cyanosis or clubbing Neuro intact no asteriis    Data Reviewed:  2017 labs ultrasound in the EMR  15 minutes time spent with patient > half in counseling coordination of care

## 2016-11-25 NOTE — Patient Instructions (Addendum)
  Your physician has requested that you go to the basement for the lab work before leaving today.   You have been scheduled for an abdominal ultrasound at [facility] (1st floor of hospital) on 12/02/16 at 11:30AM. Please arrive 15 minutes prior to your appointment for registration. Make certain not to have anything to eat or drink 6 hours prior to your appointment. Should you need to reschedule your appointment, please contact radiology at [phone]. This test typically takes about 30 minutes to perform.  Follow up in 6 months with Dr Carlean Purl.  I appreciate the opportunity to care for you. Silvano Rusk, MD, [facility]

## 2016-11-25 NOTE — Assessment & Plan Note (Signed)
better 

## 2016-11-25 NOTE — Assessment & Plan Note (Signed)
Stable to improved.  

## 2016-11-26 LAB — AFP TUMOR MARKER: AFP TUMOR MARKER: 5.2 ng/mL (ref <6.1)

## 2016-11-27 NOTE — Progress Notes (Signed)
Labs ok  My Chart note

## 2016-12-02 ENCOUNTER — Ambulatory Visit (HOSPITAL_COMMUNITY)
Admission: RE | Admit: 2016-12-02 | Discharge: 2016-12-02 | Disposition: A | Discharge status: Home / Self Care | Payer: 59 | Source: Ambulatory Visit | Attending: Internal Medicine | Admitting: Internal Medicine

## 2016-12-02 DIAGNOSIS — K7031 Alcoholic cirrhosis of liver with ascites: Secondary | ICD-10-CM | POA: Diagnosis present

## 2016-12-03 NOTE — Progress Notes (Signed)
[place] is stable Small ascites I recommend increasing spironolactone to 100 mg daily to reduce ascites more Would Rx that x 6 mos and check a BMET in 1 month dx ascites

## 2016-12-03 NOTE — Progress Notes (Signed)
Also recheck [place] 6 mos

## 2016-12-04 ENCOUNTER — Other Ambulatory Visit: Payer: Self-pay

## 2016-12-04 DIAGNOSIS — R188 Other ascites: Secondary | ICD-10-CM

## 2016-12-04 MED ORDER — SPIRONOLACTONE 50 MG PO TABS: 100.0000 mg | ORAL_TABLET | Freq: Every day | ORAL | 1 refills | Status: DC

## 2016-12-12 ENCOUNTER — Other Ambulatory Visit: Payer: Self-pay

## 2016-12-12 MED ORDER — HYDROCODONE-ACETAMINOPHEN 5-325 MG PO TABS: ORAL_TABLET | ORAL | 0 refills | Status: DC

## 2016-12-12 NOTE — Telephone Encounter (Signed)
rx printed and waiting on signature 

## 2016-12-12 NOTE — Telephone Encounter (Signed)
Pt left v/m requesting rx hydrocodone apap. Call when ready for pick up. rx last printed # 90 on 11/13/16. Last seen annual exam on 10/18/16.

## 2016-12-12 NOTE — Telephone Encounter (Signed)
Spoke to pt. Rx up front ready for pickup 

## 2016-12-12 NOTE — Telephone Encounter (Signed)
Approved: okay to prepare Rx for #90 x 0

## 2016-12-23 ENCOUNTER — Other Ambulatory Visit: Payer: Self-pay | Admitting: Internal Medicine

## 2016-12-23 DIAGNOSIS — R188 Other ascites: Secondary | ICD-10-CM

## 2016-12-30 ENCOUNTER — Telehealth: Payer: Self-pay

## 2016-12-30 NOTE — Telephone Encounter (Signed)
Received fax from [facility] for refill on his spironolactone 50mg , will route to Dr Carlean Purl to advise.  Last seen May 14th 2018.

## 2016-12-31 MED ORDER — SPIRONOLACTONE 50 MG PO TABS: 100.0000 mg | ORAL_TABLET | Freq: Every day | ORAL | 1 refills | Status: DC

## 2016-12-31 NOTE — Telephone Encounter (Signed)
Refill x 6 mos 

## 2016-12-31 NOTE — Telephone Encounter (Signed)
Refill sent in

## 2017-01-05 ENCOUNTER — Other Ambulatory Visit: Payer: Self-pay | Admitting: Internal Medicine

## 2017-01-09 ENCOUNTER — Other Ambulatory Visit: Payer: Self-pay

## 2017-01-09 MED ORDER — HYDROCODONE-ACETAMINOPHEN 5-325 MG PO TABS: ORAL_TABLET | ORAL | 0 refills | Status: DC

## 2017-01-09 NOTE — Telephone Encounter (Signed)
error 

## 2017-01-09 NOTE — Telephone Encounter (Signed)
Spoke to pt. Rx up front. Spoke to [place] at [facility] and authorized early refill.

## 2017-01-09 NOTE — Telephone Encounter (Signed)
Pt left v/m; pt going out of town 01/10/17 and pt request hydrocodone apap rx. Pt is not out of med but since be out of town until after 01/15/17 request to pick up rx 01/09/17. Last printed # 90 on 12/12/16; last annual exam on 10/18/16.

## 2017-01-09 NOTE — Telephone Encounter (Signed)
Let pharmacy know I have approved 2 days early due to vacation plans

## 2017-01-17 ENCOUNTER — Other Ambulatory Visit (INDEPENDENT_AMBULATORY_CARE_PROVIDER_SITE_OTHER): Payer: 59

## 2017-01-17 DIAGNOSIS — R188 Other ascites: Secondary | ICD-10-CM | POA: Diagnosis not present

## 2017-01-17 LAB — BASIC METABOLIC PANEL
BUN: 9 mg/dL (ref 6–23)
CALCIUM: 9 mg/dL (ref 8.4–10.5)
CO2: 34 mEq/L — ABNORMAL HIGH (ref 19–32)
CREATININE: 0.81 mg/dL (ref 0.40–1.50)
Chloride: 94 mEq/L — ABNORMAL LOW (ref 96–112)
GFR: 100.47 mL/min (ref >60.00)
Glucose, Bld: 120 mg/dL — ABNORMAL HIGH (ref 70–99)
Potassium: 3.9 mEq/L (ref 3.5–5.1)
Sodium: 133 mEq/L — ABNORMAL LOW (ref 135–145)

## 2017-01-21 NOTE — Progress Notes (Signed)
Minor abnormalities ok My Chart message

## 2017-01-22 ENCOUNTER — Encounter: Payer: Self-pay | Admitting: Internal Medicine

## 2017-01-24 ENCOUNTER — Ambulatory Visit (INDEPENDENT_AMBULATORY_CARE_PROVIDER_SITE_OTHER): Payer: 59 | Admitting: Internal Medicine

## 2017-01-24 ENCOUNTER — Encounter: Payer: Self-pay | Admitting: Internal Medicine

## 2017-01-24 VITALS — BP 120/66 | HR 73 | Ht 75.0 in | Wt 172.0 lb

## 2017-01-24 DIAGNOSIS — R634 Abnormal weight loss: Secondary | ICD-10-CM

## 2017-01-24 DIAGNOSIS — K7031 Alcoholic cirrhosis of liver with ascites: Secondary | ICD-10-CM

## 2017-01-24 DIAGNOSIS — R6881 Early satiety: Secondary | ICD-10-CM

## 2017-01-24 NOTE — Progress Notes (Signed)
Billy [name] 71 y.o. 15-Apr-1946 [id number]  Assessment & Plan:   Encounter Diagnoses  Name Primary?  . Alcoholic cirrhosis of liver with ascites ([place]) Yes  . Early satiety   . Loss of weight     Think his symptoms might be more impression in etiology. The weight loss certainly can be due to the diuresis with increased dose. I suspect that his. I did suggest considering an EGD, he wants to think about that. If he continues to have problems it could be worthwhile just to rule out any significant GI pathology did have esophageal varices in 2014 he has not needed a repeat EGD because he is on a beta blocker.  He feels like his weight is stabilizing her up a little bit and wants to give it a few weeks and will let me know. He is not compliant with a low sodium diet so I issued some instructions to him again today to review.    Subjective:   Chief Complaint:Fatigue, loss of energy and strength  HPI The patient is here my request, I had done some follow-up labs, chemistries which were okay with the exception of mild hyperglycemia though he wasn't fasting, and he messaged back that he felt like he wasn't as strong as he had been just fatigue tired and was losing weight though he knew we would be from the increased diuretics as appetite has been off as well. He does describe some early satiety. He is on Remeron 45 mg which helps him sleep. Does have a history of depression. He says he can't lift his much weight is used to and walk carrying bags of multiple etc. like he used to last year. His as him somewhat concerned. However today he feels like his weight is up a few pounds more was last week and is encouraged by that. He does relate that he eats salty pretzels and rinks V8 juice and is not as compliant with a low sodium diet as he can be. I had increased his Aldactone after ultrasound for routine screening of the liver suggested persistent ascites the other month. That was in  May. Allergies  Allergen Reactions  . Bee Venom     Yellow Jackets   . Nsaids Other (See Comments)    Stomach pain  . Other     Blood thinners reaction unknown   Current Meds  Medication Sig  . Cholecalciferol (VITAMIN D3) 2000 UNITS TABS Take 2,000 Units by mouth daily.  . furosemide (LASIX) 40 MG tablet TAKE 1 TABLET (40 MG TOTAL) BY MOUTH DAILY.  Marland Kitchen HYDROcodone-acetaminophen (NORCO/VICODIN) 5-325 MG tablet TAKE 1 TABLET BY MOUTH 3 TIMES A DAY AS NEEDED  . KRILL OIL PO Take 1 capsule by mouth every evening. 350 mg  . mirtazapine (REMERON) 45 MG tablet TAKE 1 TABLET (45 MG TOTAL) BY MOUTH AT BEDTIME.  . Multiple Vitamin (MULTIVITAMIN WITH MINERALS) TABS Take 1 tablet by mouth daily.  . nadolol (CORGARD) 40 MG tablet TAKE 1 TABLET (40 MG TOTAL) BY MOUTH DAILY.  Marland Kitchen omeprazole (PRILOSEC) 40 MG capsule TAKE 1 CAPSULE (40 MG TOTAL) BY MOUTH DAILY.  Marland Kitchen PREVIDENT 5000 DRY MOUTH 1.1 % GEL dental gel as directed.  . psyllium (METAMUCIL) 58.6 % powder Take 1 packet by mouth daily.   Marland Kitchen spironolactone (ALDACTONE) 50 MG tablet Take 2 tablets (100 mg total) by mouth daily.  . vitamin C (ASCORBIC ACID) 500 MG tablet Take 500 mg by mouth daily.   Past Medical History:  Diagnosis Date  . Acute posthemorrhagic anemia   . Alcoholic cirrhosis of liver ([place]) 09/24/2011   Years of alcohol use. Progressed from fatty liver to cirrhosis based upon review imaging. Manifestations are portal hypertension with esophageal varices, portal gastropathy, rectal varices and he may have portal colopathy. Ascites also present. Naive to HAV and HBV Claims he has had pneumococcal vaccine   . Alcoholism ([place])   . Angiodysplasia of colon 09/11/2011   Multiple in the right colon, ablated with APC. Question if this is portal colopathy.   . Back pain   . Basal cell carcinoma   . Bilateral varicoceles   . Campylobacter diarrhea 01/07/2012  . DDD (degenerative disc disease)   . Depression    mild at most  . Diverticulosis of  colon   . Erectile dysfunction   . Gastric ulcer 08/2011   small, antral ulcer  . GERD (gastroesophageal reflux disease)    takes Omeprazole prn  . GI hemorrhage 2006, 2013   esophagitis and 1+ varices, melena in 2013   . History of blood transfusion    as a child and no abnormal reaction noted  . History of bronchitis    >108yrs ago  . Hydrocele   . Hypertension    takes Nadolol daily  . Personal history of colonic polyps   . 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 Scrotal edema   . Sleep disturbance    takes Remeron nightly  . Thrombocytopenia, unspecified ([place])   . 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 CATARACT EXTRACTION  2013   bilateral with IOL  . CHOLECYSTECTOMY  12/31/2012   Procedure: LAPAROSCOPIC CHOLECYSTECTOMY;  Surgeon: Zenovia Jarred, MD;  Location: [facility];  Service: General;;  . COLONOSCOPY  2001, 200411/02/2006  . COLONOSCOPY  09/11/2011   Procedure: COLONOSCOPY;  Surgeon: Gatha Mayer, MD;  [facility];  Service: Endoscopy;  Laterality: N/A;  . ESOPHAGOGASTRODUODENOSCOPY  2006  . ESOPHAGOGASTRODUODENOSCOPY  09/11/2011   Procedure: ESOPHAGOGASTRODUODENOSCOPY (EGD);  Surgeon: Gatha Mayer, MD;  Location[facility];  Service: Endoscopy;  Laterality: N/A;  . EYE SURGERY    . FLEXIBLE SIGMOIDOSCOPY  01/07/2012   Procedure: FLEXIBLE SIGMOIDOSCOPY;  Surgeon: Lafayette Dragon, MD;  [facility];  Service: Endoscopy;  Laterality: N/A;  . KNEE ARTHROSCOPY  2008   Right  . KNEE ARTHROSCOPY  2008   Left  . [place] THORA/PARACENTESIS  10/28/2011  . Varicocele repair  1980's   I reviewed the social and family history EMR. Review of Systems As per history of present illness  Objective:   Physical Exam BP 120/66   Pulse 73   Ht 6\' 3"  (1.905 m)   Wt 172 lb (78 kg)   BMI 21.50 kg/m  No acute distress Eyes are anicteric Lungs are clear No  gynecomastia Heart sounds normal Abdomen is protuberant with prominent abdominal wall veins, there could be a small amount of ascites and liver is small and not palpable neither is the spleen Extremities are free of edema cyanosis or clubbing Affect appears slightly flat but this is baseline I think  Lab Results  Component Value Date   CREATININE 0.81 01/17/2017   BUN 9 01/17/2017   NA 133 (L) 01/17/2017   K 3.9 01/17/2017   CL 94 (L) 01/17/2017   CO2 34 (H) 01/17/2017

## 2017-01-24 NOTE — Patient Instructions (Signed)
   Let me know how you are in about a month, or sooner if you have concerns.  Reduce your sodium intake. Low sodium diet  I appreciate the opportunity to care for you. Gatha Mayer, MD, [facility]

## 2017-02-06 ENCOUNTER — Other Ambulatory Visit: Payer: Self-pay

## 2017-02-06 MED ORDER — HYDROCODONE-ACETAMINOPHEN 5-325 MG PO TABS: ORAL_TABLET | ORAL | 0 refills | Status: DC

## 2017-02-06 NOTE — Telephone Encounter (Signed)
Pain Contract signed [name] [place]  Last UDS Toxassure 10/2016 [place] reviewed on 02/06/2017 by myself .Marland Kitchen No red flags Last OV pain meds discussed: 10/2016  Refill printed.

## 2017-02-06 NOTE — Telephone Encounter (Signed)
Pt left v/m requesting rx hydrocodone apap. Call when ready for pick up. Last printed # 90 on 01/09/17; last annual 10/18/16. Pt will need to pick up on 11/08/16 due to needing med for weekend.Dr Silvio Pate out of office.

## 2017-02-07 NOTE — Telephone Encounter (Signed)
Billy [name] notified by telephone that his prescription is ready to be picked up at the front desk.

## 2017-02-21 IMAGING — US US ABDOMEN COMPLETE
1 series · 14 of 25 positions shown · non-contrast
Comparison: Abdominal ultrasound October 30, 2012 and abdominal CT
scan October 26, 2012.

CLINICAL DATA: Cirrhosis,

EXAM:
ULTRASOUND ABDOMEN COMPLETE

[Series 1: us abdomen complete · 0.21mm/px · 14 of 74 slices shown]
[im 1/74]
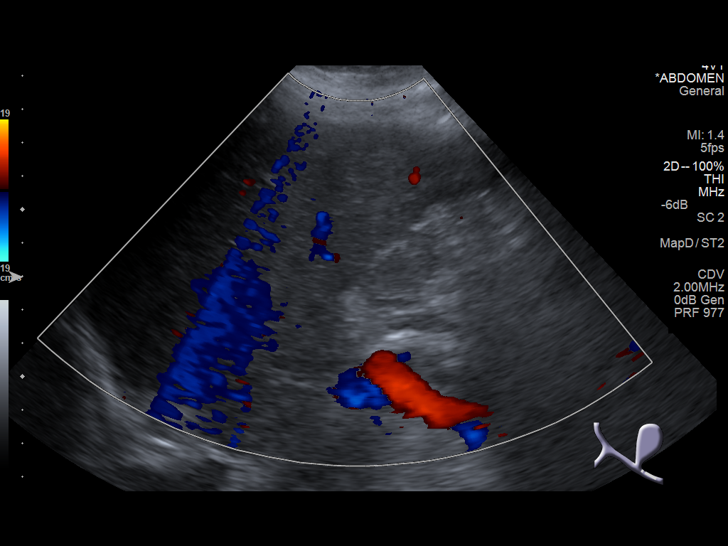
[im 7/74]
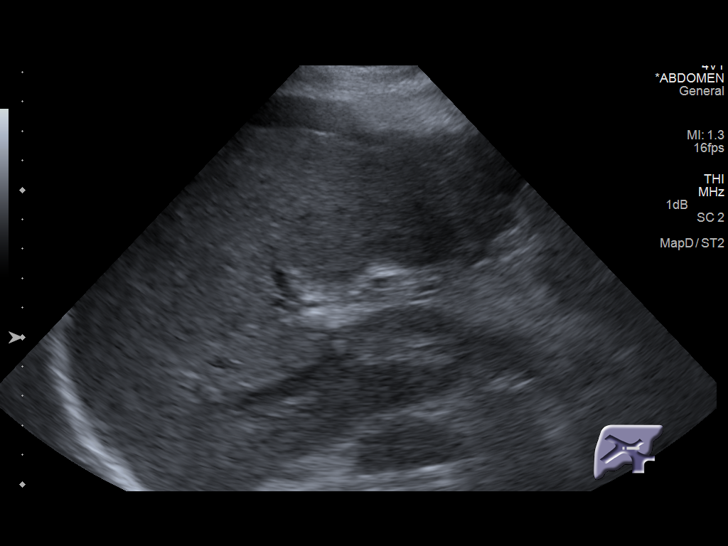
[im 13/74]
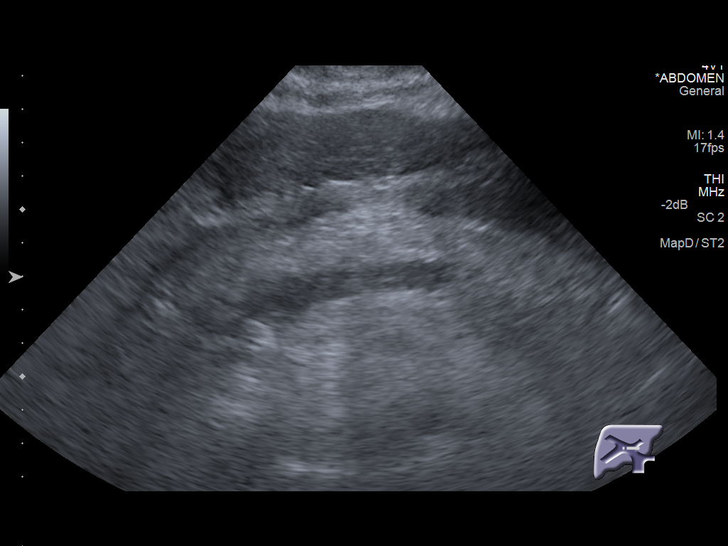
[im 19/74]
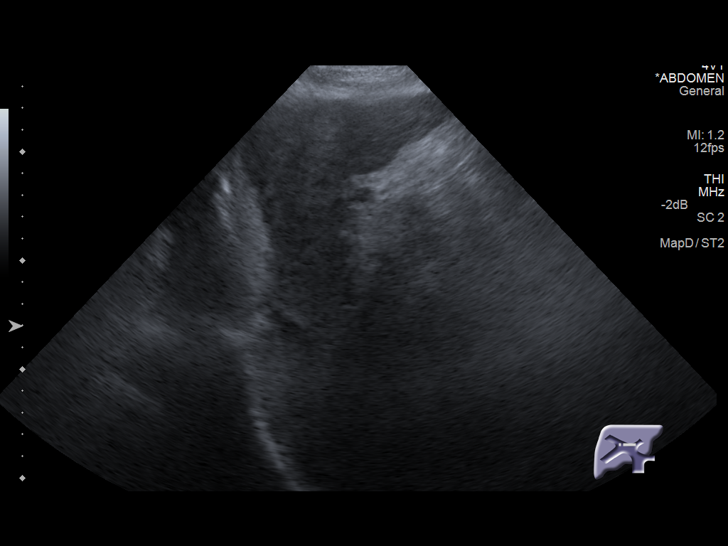
[im 25/74]
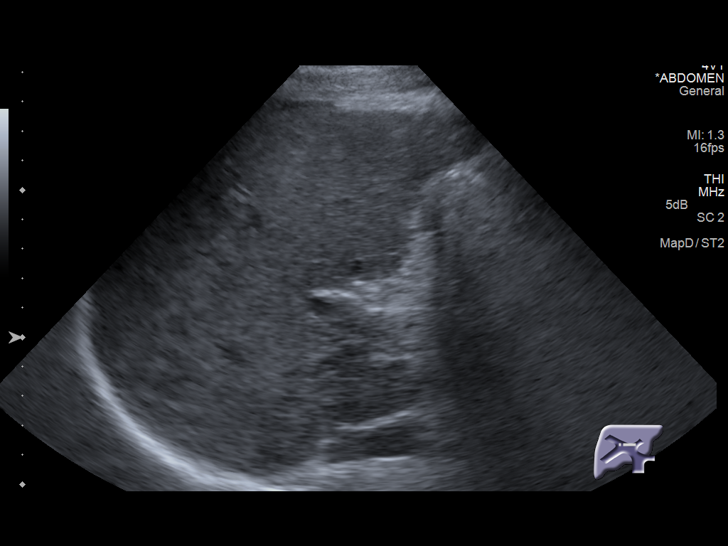
[im 28/74]
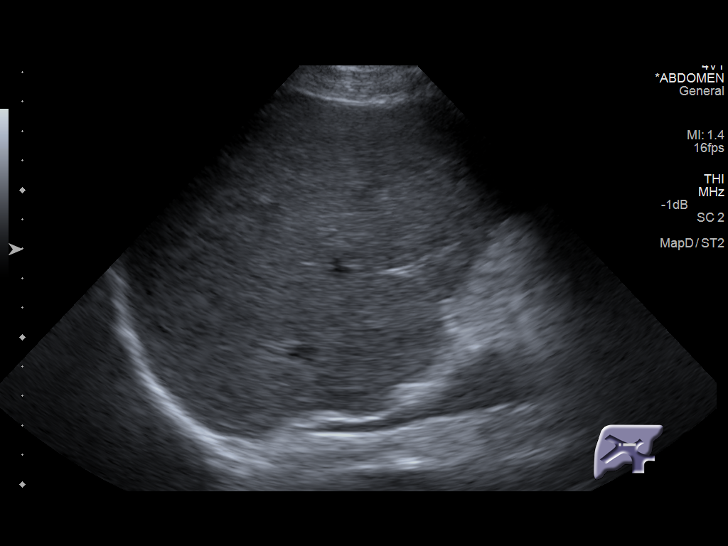
[im 34/74]
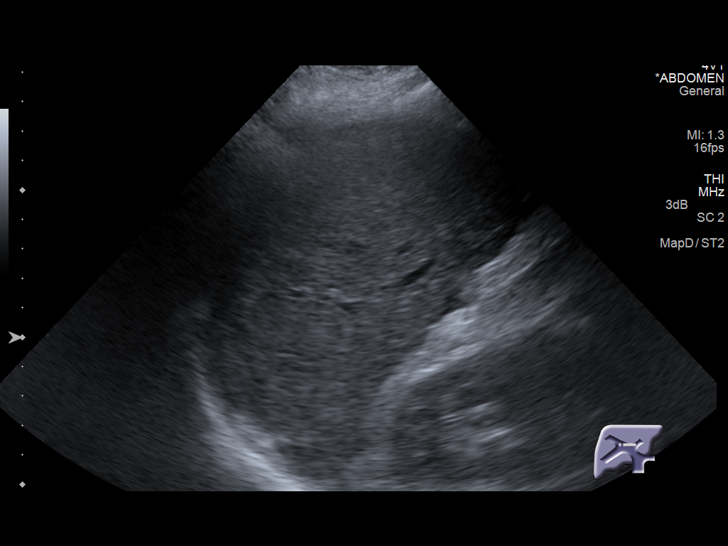
[im 40/74]
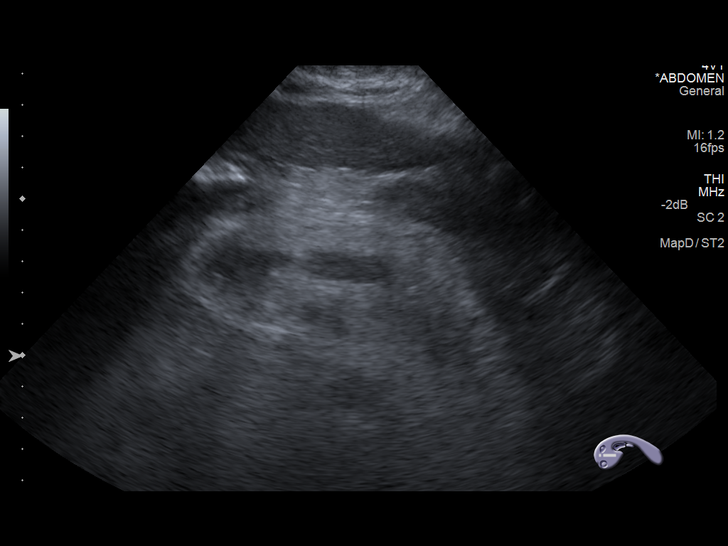
[im 46/74]
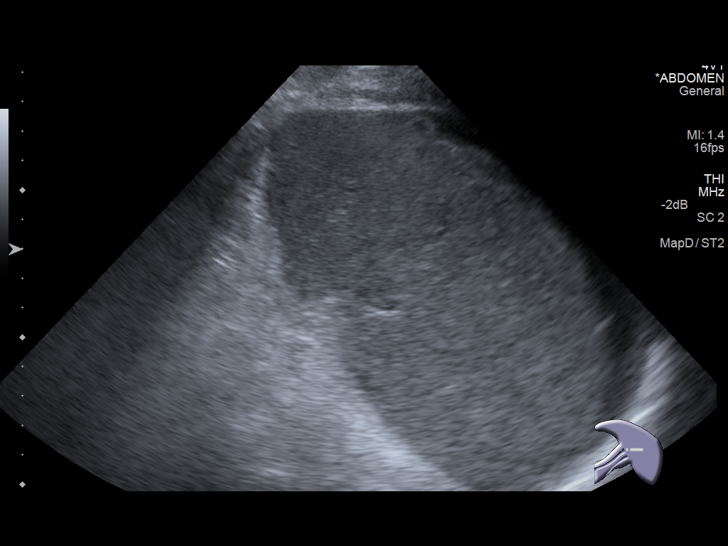
[im 49/74]
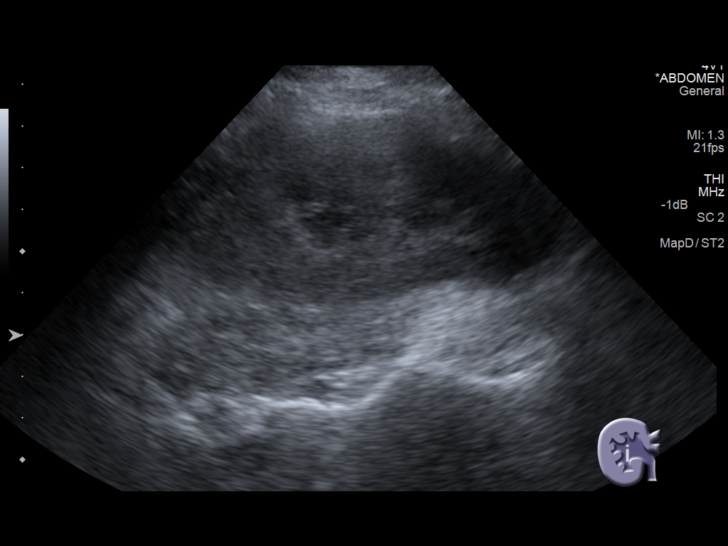
[im 55/74]
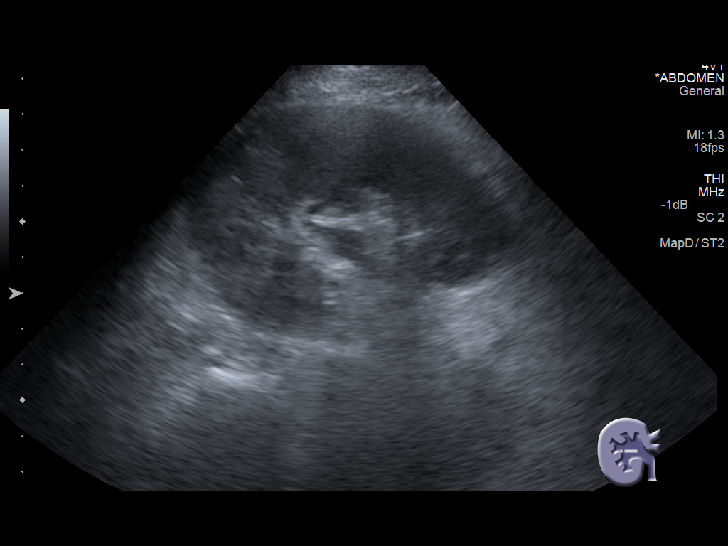
[im 61/74]
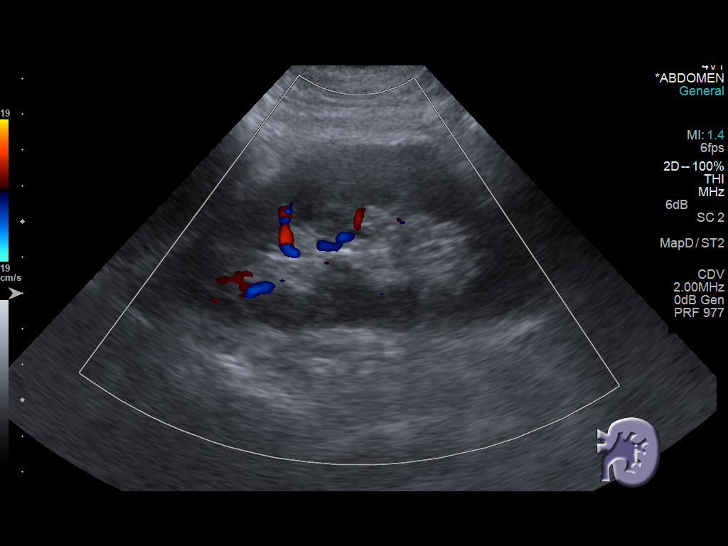
[im 67/74]
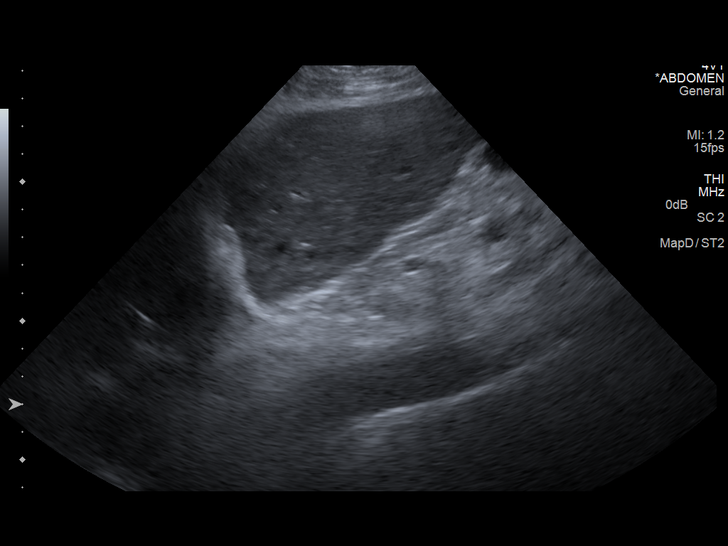
[im 74/74]
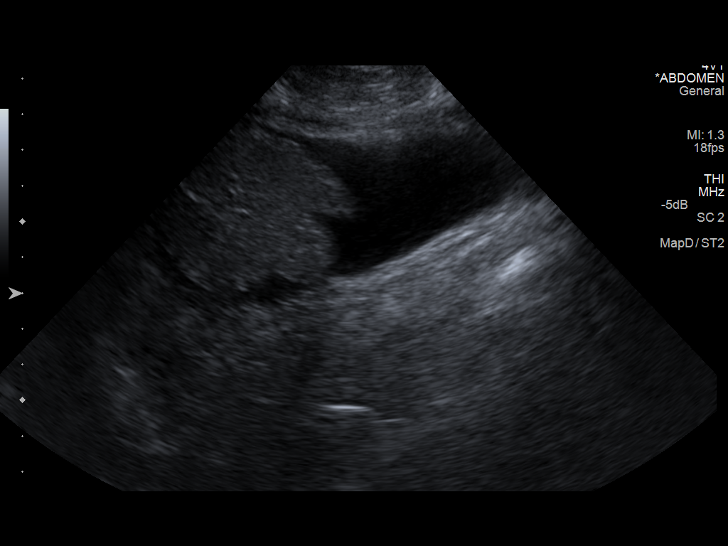

[14 of 25 positions shown; findings below may reference images not displayed]

FINDINGS: Gallbladder: The gallbladder is surgically absent

Common bile duct: Diameter: 5.3 cm

Liver: The liver appears small. The surface contour is irregular.
The echotexture is heterogeneous. There is no discrete mass nor
ductal dilation.

IVC: No abnormality visualized.

Pancreas: Visualized portion unremarkable.

Spleen: The spleen is enlarged with calculated volume of 756 cc.

Right Kidney: Length: 11.1 cm. Echogenicity within normal limits. No
mass or hydronephrosis visualized.

Left Kidney: Length: 11.8 cm. Echogenicity within normal limits. No
mass or hydronephrosis visualized.

Abdominal aorta: No aneurysm visualized.

Other findings: There is a moderate amount of ascites surrounding
the liver.
IMPRESSION: Shrunken, cirrhotic-appearing liver without discrete mass. There is
splenomegaly. There is a moderate amount of ascites. No significant
abnormality observed elsewhere within the abdomen.

## 2017-03-06 ENCOUNTER — Other Ambulatory Visit: Payer: Self-pay

## 2017-03-06 NOTE — Telephone Encounter (Signed)
Pt left v/m requesting rx hydrocodone apap. Call when ready for pick up. rx last printed # 90 on 02/06/17. Last seen annual 10/18/16. Pt request to pick up on 03/07/17 because pt will be out of med on 03/09/17.

## 2017-03-07 MED ORDER — HYDROCODONE-ACETAMINOPHEN 5-325 MG PO TABS: ORAL_TABLET | ORAL | 0 refills | Status: DC

## 2017-03-07 NOTE — Telephone Encounter (Signed)
Spoke to pt. Rx up front.

## 2017-04-04 ENCOUNTER — Other Ambulatory Visit: Payer: Self-pay | Admitting: *Deleted

## 2017-04-04 MED ORDER — HYDROCODONE-ACETAMINOPHEN 5-325 MG PO TABS
ORAL_TABLET | ORAL | 0 refills | Status: DC
Start: 2017-04-04 — End: 2017-05-02

## 2017-04-04 NOTE — Telephone Encounter (Signed)
Last Rx 03/07/2017. Last OV 10/2016

## 2017-04-04 NOTE — Telephone Encounter (Signed)
Rx left in front office for pick up and pt is aware  

## 2017-04-13 ENCOUNTER — Emergency Department (HOSPITAL_COMMUNITY): Payer: 59

## 2017-04-13 ENCOUNTER — Encounter (HOSPITAL_COMMUNITY): Payer: Self-pay | Admitting: Emergency Medicine

## 2017-04-13 ENCOUNTER — Inpatient Hospital Stay (HOSPITAL_COMMUNITY): Payer: 59 | Admitting: Certified Registered"

## 2017-04-13 ENCOUNTER — Encounter (HOSPITAL_COMMUNITY)
Admission: EM | Disposition: A | Discharge status: Home / Self Care | Payer: Self-pay | Source: Home / Self Care | Attending: Internal Medicine

## 2017-04-13 ENCOUNTER — Inpatient Hospital Stay (HOSPITAL_COMMUNITY)
Admission: EM | Admit: 2017-04-13 | Discharge: 2017-04-16 | DRG: 369 | Disposition: A | Discharge status: Home / Self Care | Payer: 59 | Attending: Internal Medicine | Admitting: Internal Medicine

## 2017-04-13 DIAGNOSIS — I868 Varicose veins of other specified sites: Secondary | ICD-10-CM | POA: Diagnosis present

## 2017-04-13 DIAGNOSIS — D649 Anemia, unspecified: Secondary | ICD-10-CM | POA: Diagnosis not present

## 2017-04-13 DIAGNOSIS — N529 Male erectile dysfunction, unspecified: Secondary | ICD-10-CM | POA: Diagnosis present

## 2017-04-13 DIAGNOSIS — D6959 Other secondary thrombocytopenia: Secondary | ICD-10-CM | POA: Diagnosis present

## 2017-04-13 DIAGNOSIS — R739 Hyperglycemia, unspecified: Secondary | ICD-10-CM | POA: Diagnosis not present

## 2017-04-13 DIAGNOSIS — I864 Gastric varices: Secondary | ICD-10-CM | POA: Diagnosis present

## 2017-04-13 DIAGNOSIS — I8500 Esophageal varices without bleeding: Secondary | ICD-10-CM | POA: Diagnosis not present

## 2017-04-13 DIAGNOSIS — I8501 Esophageal varices with bleeding: Secondary | ICD-10-CM | POA: Diagnosis not present

## 2017-04-13 DIAGNOSIS — Z85828 Personal history of other malignant neoplasm of skin: Secondary | ICD-10-CM

## 2017-04-13 DIAGNOSIS — Z961 Presence of intraocular lens: Secondary | ICD-10-CM | POA: Diagnosis present

## 2017-04-13 DIAGNOSIS — I10 Essential (primary) hypertension: Secondary | ICD-10-CM | POA: Diagnosis present

## 2017-04-13 DIAGNOSIS — D62 Acute posthemorrhagic anemia: Secondary | ICD-10-CM | POA: Diagnosis present

## 2017-04-13 DIAGNOSIS — Z8711 Personal history of peptic ulcer disease: Secondary | ICD-10-CM

## 2017-04-13 DIAGNOSIS — Z9049 Acquired absence of other specified parts of digestive tract: Secondary | ICD-10-CM | POA: Diagnosis not present

## 2017-04-13 DIAGNOSIS — F1020 Alcohol dependence, uncomplicated: Secondary | ICD-10-CM | POA: Diagnosis present

## 2017-04-13 DIAGNOSIS — E871 Hypo-osmolality and hyponatremia: Secondary | ICD-10-CM | POA: Diagnosis present

## 2017-04-13 DIAGNOSIS — F329 Major depressive disorder, single episode, unspecified: Secondary | ICD-10-CM | POA: Diagnosis present

## 2017-04-13 DIAGNOSIS — R042 Hemoptysis: Secondary | ICD-10-CM

## 2017-04-13 DIAGNOSIS — K648 Other hemorrhoids: Secondary | ICD-10-CM | POA: Diagnosis present

## 2017-04-13 DIAGNOSIS — K3189 Other diseases of stomach and duodenum: Secondary | ICD-10-CM | POA: Diagnosis present

## 2017-04-13 DIAGNOSIS — K920 Hematemesis: Secondary | ICD-10-CM | POA: Diagnosis not present

## 2017-04-13 DIAGNOSIS — K922 Gastrointestinal hemorrhage, unspecified: Secondary | ICD-10-CM | POA: Diagnosis present

## 2017-04-13 DIAGNOSIS — K766 Portal hypertension: Secondary | ICD-10-CM | POA: Diagnosis present

## 2017-04-13 DIAGNOSIS — F17210 Nicotine dependence, cigarettes, uncomplicated: Secondary | ICD-10-CM | POA: Diagnosis present

## 2017-04-13 DIAGNOSIS — Z91030 Bee allergy status: Secondary | ICD-10-CM

## 2017-04-13 DIAGNOSIS — Z79899 Other long term (current) drug therapy: Secondary | ICD-10-CM | POA: Diagnosis not present

## 2017-04-13 DIAGNOSIS — Z888 Allergy status to other drugs, medicaments and biological substances status: Secondary | ICD-10-CM | POA: Diagnosis not present

## 2017-04-13 DIAGNOSIS — K7030 Alcoholic cirrhosis of liver without ascites: Secondary | ICD-10-CM | POA: Diagnosis present

## 2017-04-13 DIAGNOSIS — Z886 Allergy status to analgesic agent status: Secondary | ICD-10-CM

## 2017-04-13 DIAGNOSIS — K7031 Alcoholic cirrhosis of liver with ascites: Secondary | ICD-10-CM

## 2017-04-13 DIAGNOSIS — I8511 Secondary esophageal varices with bleeding: Secondary | ICD-10-CM | POA: Diagnosis present

## 2017-04-13 DIAGNOSIS — K219 Gastro-esophageal reflux disease without esophagitis: Secondary | ICD-10-CM | POA: Diagnosis present

## 2017-04-13 HISTORY — PX: ESOPHAGOGASTRODUODENOSCOPY (EGD) WITH PROPOFOL: SHX5813

## 2017-04-13 LAB — COMPREHENSIVE METABOLIC PANEL
ALBUMIN: 2.9 g/dL — AB (ref 3.5–5.0)
ALT: 21 U/L (ref 17–63)
AST: 46 U/L — AB (ref 15–41)
Alkaline Phosphatase: 98 U/L (ref 38–126)
Anion gap: 6 (ref 5–15)
BUN: 16 mg/dL (ref 6–20)
CHLORIDE: 97 mmol/L — AB (ref 101–111)
CO2: 29 mmol/L (ref 22–32)
Calcium: 8.6 mg/dL — ABNORMAL LOW (ref 8.9–10.3)
Creatinine, Ser: 0.92 mg/dL (ref 0.61–1.24)
GFR calc Af Amer: >60 mL/min (ref >60)
GFR calc non Af Amer: >60 mL/min (ref >60)
GLUCOSE: 161 mg/dL — AB (ref 65–99)
POTASSIUM: 5.4 mmol/L — AB (ref 3.5–5.1)
Sodium: 132 mmol/L — ABNORMAL LOW (ref 135–145)
Total Bilirubin: 1.7 mg/dL — ABNORMAL HIGH (ref 0.3–1.2)
Total Protein: 7 g/dL (ref 6.5–8.1)

## 2017-04-13 LAB — CBC
HEMATOCRIT: 30.4 % — AB (ref 39.0–52.0)
Hemoglobin: 10.5 g/dL — ABNORMAL LOW (ref 13.0–17.0)
MCH: 32.8 pg (ref 26.0–34.0)
MCHC: 34.5 g/dL (ref 30.0–36.0)
MCV: 95 fL (ref 78.0–100.0)
Platelets: 210 10*3/uL (ref 150–400)
RBC: 3.2 MIL/uL — ABNORMAL LOW (ref 4.22–5.81)
RDW: 15.1 % (ref 11.5–15.5)
WBC: 11.3 10*3/uL — ABNORMAL HIGH (ref 4.0–10.5)

## 2017-04-13 LAB — HEMOGLOBIN AND HEMATOCRIT, BLOOD
HEMATOCRIT: 29.2 % — AB (ref 39.0–52.0)
HEMATOCRIT: 25.7 % — AB (ref 39.0–52.0)
HEMOGLOBIN: 10.1 g/dL — AB (ref 13.0–17.0)
Hemoglobin: 8.8 g/dL — ABNORMAL LOW (ref 13.0–17.0)

## 2017-04-13 LAB — PROTIME-INR
INR: 1.13
PROTHROMBIN TIME: 14.4 s (ref 11.4–15.2)

## 2017-04-13 LAB — POC OCCULT BLOOD, ED: FECAL OCCULT BLD: POSITIVE — AB

## 2017-04-13 LAB — PREPARE RBC (CROSSMATCH)

## 2017-04-13 LAB — LIPASE, BLOOD: LIPASE: 36 U/L (ref 11–51)

## 2017-04-13 LAB — AMMONIA: AMMONIA: 32 umol/L (ref 9–35)

## 2017-04-13 LAB — ABO/RH: ABO/RH(D): O POS

## 2017-04-13 LAB — MRSA PCR SCREENING: MRSA by PCR: NEGATIVE

## 2017-04-13 SURGERY — ESOPHAGOGASTRODUODENOSCOPY (EGD) WITH PROPOFOL
Anesthesia: General

## 2017-04-13 MED ORDER — OXYCODONE HCL 5 MG/5ML PO SOLN: 5.0000 mg | Freq: Once | ORAL | Status: DC | PRN

## 2017-04-13 MED ORDER — ONDANSETRON HCL 4 MG/2ML IJ SOLN
INTRAMUSCULAR | Status: DC | PRN
  Administered 2017-04-13: 4 mg via INTRAVENOUS

## 2017-04-13 MED ORDER — PROMETHAZINE HCL 25 MG/ML IJ SOLN: 6.2500 mg | INTRAMUSCULAR | Status: DC | PRN

## 2017-04-13 MED ORDER — ONDANSETRON HCL 4 MG/2ML IJ SOLN
4.0000 mg | Freq: Four times a day (QID) | INTRAMUSCULAR | Status: DC | PRN
  Filled 2017-04-13: qty 2

## 2017-04-13 MED ORDER — DEXTROSE 5 % IV SOLN
1.0000 g | INTRAVENOUS | Status: DC
  Administered 2017-04-14 – 2017-04-16 (×3): 1 g via INTRAVENOUS
  Filled 2017-04-13 (×3): qty 10

## 2017-04-13 MED ORDER — SODIUM CHLORIDE 0.9 % IV SOLN
50.0000 ug/h | INTRAVENOUS | Status: DC
  Administered 2017-04-13 – 2017-04-16 (×6): 50 ug/h via INTRAVENOUS
  Filled 2017-04-13 (×12): qty 1

## 2017-04-13 MED ORDER — SODIUM CHLORIDE 0.9 % IV SOLN: Freq: Once | INTRAVENOUS | Status: DC

## 2017-04-13 MED ORDER — SODIUM CHLORIDE 0.9 % IV SOLN
80.0000 mg | Freq: Once | INTRAVENOUS | Status: AC
  Administered 2017-04-13: 10:00:00 80 mg via INTRAVENOUS
  Filled 2017-04-13: qty 80

## 2017-04-13 MED ORDER — PANTOPRAZOLE SODIUM 40 MG IV SOLR: 40.0000 mg | Freq: Two times a day (BID) | INTRAVENOUS | Status: DC

## 2017-04-13 MED ORDER — FENTANYL CITRATE (PF) 100 MCG/2ML IJ SOLN
50.0000 ug | INTRAMUSCULAR | Status: DC | PRN
  Administered 2017-04-13 – 2017-04-16 (×15): 50 ug via INTRAVENOUS
  Filled 2017-04-13 (×15): qty 2

## 2017-04-13 MED ORDER — MEPERIDINE HCL 25 MG/ML IJ SOLN: 6.2500 mg | INTRAMUSCULAR | Status: DC | PRN

## 2017-04-13 MED ORDER — FENTANYL CITRATE (PF) 100 MCG/2ML IJ SOLN
INTRAMUSCULAR | Status: AC
  Filled 2017-04-13: qty 2

## 2017-04-13 MED ORDER — SODIUM CHLORIDE 0.9 % IV SOLN
Freq: Once | INTRAVENOUS | Status: AC
  Administered 2017-04-13: 10:00:00 via INTRAVENOUS

## 2017-04-13 MED ORDER — PROPOFOL 10 MG/ML IV BOLUS
INTRAVENOUS | Status: AC
  Filled 2017-04-13: qty 40

## 2017-04-13 MED ORDER — FENTANYL CITRATE (PF) 100 MCG/2ML IJ SOLN
INTRAMUSCULAR | Status: DC | PRN
  Administered 2017-04-13: 50 ug via INTRAVENOUS
  Administered 2017-04-13 (×2): 25 ug via INTRAVENOUS

## 2017-04-13 MED ORDER — OCTREOTIDE ACETATE 50 MCG/ML IJ SOLN
50.0000 ug | Freq: Once | INTRAMUSCULAR | Status: AC
  Administered 2017-04-13: 50 ug via INTRAVENOUS
  Filled 2017-04-13: qty 1

## 2017-04-13 MED ORDER — HYDROMORPHONE HCL 1 MG/ML IJ SOLN: 0.2500 mg | INTRAMUSCULAR | Status: DC | PRN

## 2017-04-13 MED ORDER — LACTATED RINGERS IV SOLN
INTRAVENOUS | Status: AC
  Administered 2017-04-13: 16:00:00 via INTRAVENOUS

## 2017-04-13 MED ORDER — SUCCINYLCHOLINE CHLORIDE 200 MG/10ML IV SOSY
PREFILLED_SYRINGE | INTRAVENOUS | Status: DC | PRN
  Administered 2017-04-13: 120 mg via INTRAVENOUS

## 2017-04-13 MED ORDER — PROPOFOL 10 MG/ML IV BOLUS
INTRAVENOUS | Status: DC | PRN
  Administered 2017-04-13: 150 mg via INTRAVENOUS

## 2017-04-13 MED ORDER — PANTOPRAZOLE SODIUM 40 MG IV SOLR
8.0000 mg/h | INTRAVENOUS | Status: AC
  Administered 2017-04-13 – 2017-04-16 (×7): 8 mg/h via INTRAVENOUS
  Filled 2017-04-13 (×10): qty 80

## 2017-04-13 MED ORDER — ONDANSETRON HCL 4 MG/2ML IJ SOLN
4.0000 mg | Freq: Once | INTRAMUSCULAR | Status: AC
  Administered 2017-04-13: 4 mg via INTRAVENOUS
  Filled 2017-04-13: qty 2

## 2017-04-13 MED ORDER — OCTREOTIDE LOAD VIA INFUSION: 50.0000 ug | Freq: Once | INTRAVENOUS | Status: DC

## 2017-04-13 MED ORDER — SODIUM CHLORIDE 0.9 % IV BOLUS (SEPSIS)
500.0000 mL | Freq: Once | INTRAVENOUS | Status: AC
  Administered 2017-04-13: 500 mL via INTRAVENOUS

## 2017-04-13 MED ORDER — OXYCODONE HCL 5 MG PO TABS: 5.0000 mg | ORAL_TABLET | Freq: Once | ORAL | Status: DC | PRN

## 2017-04-13 MED ORDER — LIDOCAINE 2% (20 MG/ML) 5 ML SYRINGE
INTRAMUSCULAR | Status: DC | PRN
  Administered 2017-04-13: 60 mg via INTRAVENOUS

## 2017-04-13 MED ORDER — LACTATED RINGERS IV SOLN
INTRAVENOUS | Status: DC | PRN
  Administered 2017-04-13: 13:00:00 via INTRAVENOUS

## 2017-04-13 MED ORDER — ORAL CARE MOUTH RINSE
15.0000 mL | Freq: Two times a day (BID) | OROMUCOSAL | Status: DC
  Administered 2017-04-13 – 2017-04-16 (×5): 15 mL via OROMUCOSAL

## 2017-04-13 SURGICAL SUPPLY — 15 items

## 2017-04-13 NOTE — ED Notes (Signed)
Bed: WTR5 Expected date:  Expected time:  Means of arrival:  Comments: 

## 2017-04-13 NOTE — Consult Note (Signed)
Consultation  Referring Provider: [facility] MD/ Maryan Rued MD Primary Care Physician:  [name], MD Primary Gastroenterologist:  Dr.Gessner  Reason for Consultation:  Vomiting blood  HPI: Billy [name] is a 71 y.o. male , known to Dr. Carlean Purl with history of decompensated alcohol-induced cirrhosis with ascites, portal hypertension with previous portal gastropathy and grade 2 esophageal varices on last EGD 2013. Patient presented to the emergency room early in the morning hours today after he had onset last night about 8 PM of hematemesis with vomiting bright red blood. He had 3-4 episodes prior to arrival to the emergency room. He has not had any further vomiting over the past 3 hours. Patient also started having dark red bloody bowel movements with 2-3 episodes prior to arrival in the ER. He has felt lightheaded and dizzy but has not had syncope. Had felt somewhat short of breath, no complaints of chest pain. He has not been hypotensive since arrival in the ER. He has been placed on IV fluids, IV PPI, and octreotide. He has been taking his medications as directed but admits he has been "cheating" having some alcohol 2-3 times per week. His wife says that she's never seen him drunk. Last labs May 2018 hemoglobin 14.8, platelets 149. Labs today hemoglobin 10.5 hematocrit 30.4, platelets 210, BUN 16, creatinine 0.92, T bili 1.7, AST 46, INR is pending  Last EGD 2013 showed grade 2 varices, portal gastropathy, duodenitis and he also had a 5-6 mm antral ulcer Colonoscopy February 2013 with finding of multiple scattered AVMs 5 of which were ablated, mild diverticulosis, internal hemorrhoids and rectal varices. 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 Acute posthemorrhagic anemia   . Alcoholic cirrhosis of liver ([place]) 09/24/2011   Years of alcohol use. Progressed from fatty liver to cirrhosis based upon review imaging. Manifestations are  portal hypertension with esophageal varices, portal gastropathy, rectal varices and he may have portal colopathy. Ascites also present. Naive to HAV and HBV Claims he has had pneumococcal vaccine   . Alcoholism ([place])   . Angiodysplasia of colon 09/11/2011   Multiple in the right colon, ablated with APC. Question if this is portal colopathy.   . Back pain   . Basal cell carcinoma   . Bilateral varicoceles   . Campylobacter diarrhea 01/07/2012  . DDD (degenerative disc disease)   . Depression    mild at most  . Diverticulosis of colon   . Erectile dysfunction   . Gastric ulcer 08/2011   small, antral ulcer  . GERD (gastroesophageal reflux disease)    takes Omeprazole prn  . GI hemorrhage 2006, 2013   esophagitis and 1+ varices, melena in 2013   . History of blood transfusion    as a child and no abnormal reaction noted  . History of bronchitis    >2yrs ago  . Hydrocele   . Hypertension    takes Nadolol daily  . Personal history of colonic polyps   . 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 Scrotal edema   . Sleep disturbance    takes Remeron nightly  . Thrombocytopenia, unspecified ([place])   . Varices, esophageal ([place]) 08/2011   Grade 2, mid-esophagus    Past Surgical History:  Procedure Laterality Date  . CATARACT EXTRACTION  2013   bilateral with IOL  . CHOLECYSTECTOMY  12/31/2012   Procedure: LAPAROSCOPIC CHOLECYSTECTOMY;  Surgeon: Zenovia Jarred, MD;  Location: [facility];  Service: General;;  . COLONOSCOPY  2001, 200411/02/2006  . COLONOSCOPY  09/11/2011   Procedure: COLONOSCOPY;  Surgeon: Gatha Mayer, MD;  Location: [facility];  Service: Endoscopy;  Laterality: N/A;  . ESOPHAGOGASTRODUODENOSCOPY  2006  . ESOPHAGOGASTRODUODENOSCOPY  09/11/2011   Procedure: ESOPHAGOGASTRODUODENOSCOPY (EGD);  Surgeon: Gatha Mayer, MD;  [facility];  Service: Endoscopy;  Laterality: N/A;  . EYE SURGERY    . FLEXIBLE SIGMOIDOSCOPY  01/07/2012    Procedure: FLEXIBLE SIGMOIDOSCOPY;  Surgeon: Lafayette Dragon, MD;  Location: [facility];  Service: Endoscopy;  Laterality: N/A;  . KNEE ARTHROSCOPY  2008   Right  . KNEE ARTHROSCOPY  2008   Left  . [place] THORA/PARACENTESIS  10/28/2011  . Varicocele repair  1980's    Prior to Admission medications   Medication Sig Start Date End Date Taking? Authorizing Provider  furosemide (LASIX) 40 MG tablet TAKE 1 TABLET (40 MG TOTAL) BY MOUTH DAILY. 12/23/16  Yes Gatha Mayer, MD  HYDROcodone-acetaminophen (NORCO/VICODIN) 5-325 MG tablet TAKE 1 TABLET BY MOUTH 3 TIMES A DAY AS NEEDED 04/04/17  Yes Jearld Fenton, NP  mirtazapine (REMERON) 45 MG tablet TAKE 1 TABLET (45 MG TOTAL) BY MOUTH AT BEDTIME. 01/06/17  Yes [name], MD  Multiple Vitamin (MULTIVITAMIN WITH MINERALS) TABS Take 1 tablet by mouth daily.   Yes [provider]  nadolol (CORGARD) 40 MG tablet TAKE 1 TABLET (40 MG TOTAL) BY MOUTH DAILY. 10/24/16  Yes Gatha Mayer, MD  omeprazole (PRILOSEC) 40 MG capsule TAKE 1 CAPSULE (40 MG TOTAL) BY MOUTH DAILY. 11/21/16  Yes [name], MD  PREVIDENT 5000 DRY MOUTH 1.1 % GEL dental gel Place 1 application onto teeth at bedtime.  02/27/16  Yes [provider]  psyllium (METAMUCIL) 58.6 % powder Take 1 packet by mouth daily.    Yes [provider]  spironolactone (ALDACTONE) 50 MG tablet Take 2 tablets (100 mg total) by mouth daily. 12/31/16  Yes Gatha Mayer, MD  vitamin C (ASCORBIC ACID) 500 MG tablet Take 500 mg by mouth daily.   Yes [provider]    Current Facility-Administered Medications  Medication Dose Route Frequency Provider Last Rate Last Dose  . octreotide (SANDOSTATIN) injection 50 mcg  50 mcg Intravenous Once Blanchie Dessert, MD      . pantoprazole (PROTONIX) 80 mg in sodium chloride 0.9 % 100 mL IVPB  80 mg Intravenous Once Blanchie Dessert, MD      . pantoprazole (PROTONIX) 80 mg in sodium chloride 0.9 % 250 mL (0.32 mg/mL)  infusion  8 mg/hr Intravenous Continuous Blanchie Dessert, MD      . Derrill Memo ON 04/16/2017] pantoprazole (PROTONIX) injection 40 mg  40 mg Intravenous Q12H Plunkett, Whitney, MD      . sodium chloride 0.9 % bolus 500 mL  500 mL Intravenous Once Blanchie Dessert, MD       Current Outpatient Prescriptions  Medication Sig Dispense Refill  . furosemide (LASIX) 40 MG tablet TAKE 1 TABLET (40 MG TOTAL) BY MOUTH DAILY. 90 tablet 1  . HYDROcodone-acetaminophen (NORCO/VICODIN) 5-325 MG tablet TAKE 1 TABLET BY MOUTH 3 TIMES A DAY AS NEEDED 90 tablet 0  . mirtazapine (REMERON) 45 MG tablet TAKE 1 TABLET (45 MG TOTAL) BY MOUTH AT BEDTIME. 90 tablet 1  . Multiple Vitamin (MULTIVITAMIN WITH MINERALS) TABS Take 1 tablet by mouth daily.    . nadolol (CORGARD) 40 MG tablet TAKE 1 TABLET (40 MG TOTAL) BY  MOUTH DAILY. 90 tablet 3  . omeprazole (PRILOSEC) 40 MG capsule TAKE 1 CAPSULE (40 MG TOTAL) BY MOUTH DAILY. 90 capsule 3  . PREVIDENT 5000 DRY MOUTH 1.1 % GEL dental gel Place 1 application onto teeth at bedtime.     . psyllium (METAMUCIL) 58.6 % powder Take 1 packet by mouth daily.     Marland Kitchen spironolactone (ALDACTONE) 50 MG tablet Take 2 tablets (100 mg total) by mouth daily. 180 tablet 1  . vitamin C (ASCORBIC ACID) 500 MG tablet Take 500 mg by mouth daily.      Allergies as of 04/13/2017 - Review Complete 04/13/2017  Allergen Reaction Noted  . Bee venom  03/08/2015  . Nsaids Other (See Comments) 12/21/2012  . Other  12/23/2012    Family History  Problem Relation Age of Onset  . Stroke Mother   . Diabetes Mother   . Liver cancer Cousin   . Liver cancer Paternal Uncle   . Colon cancer Neg Hx   . Stomach cancer Neg Hx   . Esophageal cancer Neg Hx     Social History   Social History  . Marital status: Married    Spouse name: N/A  . Number of children: 1  . Years of education: N/A   Occupational History  . Retired     mostly from [employer]   Social History Main Topics  . Smoking status:  Current Some Day Smoker    Packs/day: 0.50    Types: Cigarettes  . Smokeless tobacco: Never Used     Comment: current using E cigarette, also  . Alcohol use No  . Drug use: No  . Sexual activity: No   Other Topics Concern  . Not on file   Social History Narrative   Married- 2nd   1 son (adopted) from previous marriage      Has living will   Wife is  health care POA   Would accept attempts at resuscitation   No tube feeds if cognitively unaware          Review of Systems: Pertinent positive and negative review of systems were noted in the above HPI section.  All other review of systems was otherwise negative.Marland Kitchen  Physical Exam: Vital signs in last 24 hours: Temp:  [98.6 F (37 C)] 98.6 F (37 C) (09/30 0522) Pulse Rate:  [87] 87 (09/30 0522) Resp:  [16] 16 (09/30 0522) BP: (140)/(114) 140/114 (09/30 0522) SpO2:  [98 %] 98 % (09/30 0522) Weight:  [174 lb (78.9 kg)] 174 lb (78.9 kg) (09/30 0522)   General:   Alert,  Well-developed, well-nourishedThin older white male, pleasant and cooperative in NAD Head:  Normocephalic and atraumatic. Eyes:  Sclera clear, no icterus.   Conjunctiva pink. Ears:  Normal auditory acuity. Nose:  No deformity, discharge,  or lesions. Mouth:  No deformity or lesions.   Neck:  Supple; no masses or thyromegaly. Lungs:  Clear throughout to auscultation.   No wheezes, crackles, or rhonchi. Heart:  Regular rate and rhythm; no murmurs, clicks, rubs,  or gallops. Abdomen:  Soft, nontender, bowel sounds are present no palpable mass or hepatosplenomegaly non-tense ascites.   Rectal:  Deferred -bloody stool witnessed per ER staff Msk:  Symmetrical without gross deformities. . Pulses:  Normal pulses noted. Extremities:  Without clubbing or edema. Neurologic:  Alert and  oriented x4;  grossly normal neurologically. Skin:  Intact without significant lesions or rashes.. Psych:  Alert and cooperative. Normal mood and affect.  Intake/Output from  previous  day: No intake/output data recorded. Intake/Output this shift: No intake/output data recorded.  Lab Results:  Recent Labs  04/13/17 0531  WBC 11.3*  HGB 10.5*  HCT 30.4*  PLT 210   BMET  Recent Labs  04/13/17 0531  NA 132*  K 5.4*  CL 97*  CO2 29  GLUCOSE 161*  BUN 16  CREATININE 0.92  CALCIUM 8.6*   LFT  Recent Labs  04/13/17 0531  PROT 7.0  ALBUMIN 2.9*  AST 46*  ALT 21  ALKPHOS 77  BILITOT 1.7*     IMPRESSION:  #32 71 year old white male alcoholic with decompensated alcoholic liver disease with previously documented 2+ esophageal varices, and portal gastropathy on EGD 2013 who presents with multiple episodes of hematemesis and bloody stools onset last evening. Patient  has been hemodynamically stable, hemoglobin down 5 g from baseline. Acute upper GI bleeding consistent with acute variceal hemorrhage  #2 history of antral ulcer 2013 clean-based #3 history of portal colopathy last colonoscopy 2013 also documented internal hemorrhoids and rectal varices #4 ascites-stable on diuretics #5 Love surveillance-up-to-date with last ultrasound May 2018 #6 Acute blood loss anemia #7 ongoing alcohol use with lack of insight into its implications regarding cirrhosis  PLAN: To be admitted to ICU Keep nothing by mouth Plan emergent upper endoscopy with Dr. Loletha Carrow this morning. Procedure discussed in detail with the patient  and his wife, including risks and benefits and they're agreeable to proceed Stat hemoglobin now, then every 6 hours. Transfuse to keep hemoglobin in the 7-8 range Octreotide infusion has been started PPI infusion started Start IV ceftriaxone We will follow closely with you   Amy Esterwood  04/13/2017, 9:31 AM  I have reviewed the entire case in detail with the above APP and discussed the plan in detail.  Therefore, I agree with the diagnoses recorded above. In addition,  I have personally interviewed and examined the patient in the endoscopy lab  for his procedure, and have personally reviewed any abdominal/pelvic CT scan images. I reviewed his last upper endoscopy, colonoscopy and sigmoidoscopy reports.  My additional thoughts are as follows: 71 year old man with alcohol-related cirrhosis and known esophageal varices who presents with hematemesis and melena with severe acute blood loss anemia. Fortunately, his bleeding seems to have subsided for now, and his vital signs are stable. However, he is at high risk for rebleeding and needs endoscopic localization and therapy. This most likely represents esophageal variceal bleeding. As such, I discussed with the patient likelihood of esophageal banding. I agree that he needs ceftriaxone because he has an acute upper GI bleed in the setting of ascites. Ceftriaxone 1 g daily 3 days should be sufficient. Fortunately, his renal function is normal at present, and his INR has returned normal as well.  Upper endoscopy with banding was discussed in detail, and the patient is agreeable.  The benefits and risks of the planned procedure were described in detail with the patient or (when appropriate) their health care proxy.  Risks were outlined as including, but not limited to, bleeding, infection, perforation, adverse medication reaction leading to cardiac or pulmonary decompensation, or pancreatitis (if ERCP).  The limitation of incomplete mucosal visualization was also discussed.  No guarantees or warranties were given.  Patient at increased risk for cardiopulmonary complications of procedure due to medical comorbidities.    Billy [name] Pager [phone]  Mon-Fri 8a-5p [phone] after 5p, weekends, holidays

## 2017-04-13 NOTE — H&P (Signed)
PULMONARY / CRITICAL CARE MEDICINE   Name: Billy [name] MRN: [id number] DOB: [birth year]    ADMISSION DATE:  04/13/2017  CHIEF COMPLAINT:  Vomiting blood and having blood BMs x 1 day  HISTORY OF PRESENT ILLNESS:   71 year old man past medical history of alcoholic cirrhosis with a known history of varices and one prior episode of GI bleed remotely presented to the emergency department with complaints of throwing up blood and passing bloody stools. In the emergency department he was found to be hemodynamically stable however he was noted to have a decrease in his hemoglobin of 4 g from baseline. Gastroenterology was consulted.  He is being admitted to the medical ICU for close monitoring until gastroenterology is able to perform EGD. He continues to pass bloody bowel movements however he has stopped having emesis. He complains of nausea. He denies dizziness, syncope, chest pain, palpitations, abdominal pain, fevers, increasing abdominal size, prior episodes of spontaneous bacterial peritonitis.  PAST MEDICAL HISTORY :  He  has a past medical history of Acute posthemorrhagic anemia; Alcoholic cirrhosis of liver ([place]) (09/24/2011); Alcoholism ([place]); Angiodysplasia of colon (09/11/2011); Back pain; Basal cell carcinoma; Bilateral varicoceles; Campylobacter diarrhea (01/07/2012); DDD (degenerative disc disease); Depression; Diverticulosis of colon; Erectile dysfunction; Gastric ulcer (08/2011); GERD (gastroesophageal reflux disease); GI hemorrhage (2006, 2013); History of blood transfusion; History of bronchitis; Hydrocele; Hypertension; Personal history of colonic polyps; Portal hypertensive gastropathy (08/2011); Rectal varices (09/11/2011); Scrotal edema; Sleep disturbance; Thrombocytopenia, unspecified ([place]); and Varices, esophageal ([place]) (08/2011).  PAST SURGICAL HISTORY: He  has a past surgical history that includes Varicocele repair (1980's); Knee arthroscopy (2008); Knee arthroscopy (2008); Colonoscopy  (2001, 200411/02/2006); Esophagogastroduodenoscopy (2006); Esophagogastroduodenoscopy (09/11/2011); Colonoscopy (09/11/2011); Cataract extraction (2013); [place] THORA/PARACENTESIS (10/28/2011); Eye surgery; Flexible sigmoidoscopy (01/07/2012); and Cholecystectomy (12/31/2012).  Allergies  Allergen Reactions  . Bee Venom     Yellow Jackets   . Nsaids Other (See Comments)    Stomach pain Bleeding risk  . Other     Increased bleeding risk    No current facility-administered medications on file prior to encounter.    Current Outpatient Prescriptions on File Prior to Encounter  Medication Sig  . furosemide (LASIX) 40 MG tablet TAKE 1 TABLET (40 MG TOTAL) BY MOUTH DAILY.  Marland Kitchen HYDROcodone-acetaminophen (NORCO/VICODIN) 5-325 MG tablet TAKE 1 TABLET BY MOUTH 3 TIMES A DAY AS NEEDED  . mirtazapine (REMERON) 45 MG tablet TAKE 1 TABLET (45 MG TOTAL) BY MOUTH AT BEDTIME.  . Multiple Vitamin (MULTIVITAMIN WITH MINERALS) TABS Take 1 tablet by mouth daily.  . nadolol (CORGARD) 40 MG tablet TAKE 1 TABLET (40 MG TOTAL) BY MOUTH DAILY.  Marland Kitchen omeprazole (PRILOSEC) 40 MG capsule TAKE 1 CAPSULE (40 MG TOTAL) BY MOUTH DAILY.  Marland Kitchen PREVIDENT 5000 DRY MOUTH 1.1 % GEL dental gel Place 1 application onto teeth at bedtime.   . psyllium (METAMUCIL) 58.6 % powder Take 1 packet by mouth daily.   Marland Kitchen spironolactone (ALDACTONE) 50 MG tablet Take 2 tablets (100 mg total) by mouth daily.  . vitamin C (ASCORBIC ACID) 500 MG tablet Take 500 mg by mouth daily.    FAMILY HISTORY:  His indicated that his mother is deceased. He indicated that his father is deceased. He indicated that his sister is alive. He indicated that both of his brothers are alive. He indicated that his paternal uncle is deceased. He indicated that his cousin is deceased. He indicated that the status of his neg hx is unknown.    SOCIAL HISTORY: He  reports that  he has been smoking Cigarettes.  He has been smoking about 0.50 packs per day. He has never used smokeless  tobacco. He reports that he does not drink alcohol or use drugs.  REVIEW OF SYSTEMS:   All other systems reviewed and negative  SUBJECTIVE:  Nausea  VITAL SIGNS: BP (!) 140/114 (BP Location: Left Arm)   Pulse 87   Temp 98.6 F (37 C) (Oral)   Resp 16   Ht 6\' 2"  (1.88 m)   Wt 174 lb (78.9 kg)   SpO2 98%   BMI 22.34 kg/m   HEMODYNAMICS:    VENTILATOR SETTINGS:    INTAKE / OUTPUT: No intake/output data recorded.  PHYSICAL EXAMINATION: General:  Appears healthy although he may be somewhat underweight Neuro:  Conscious alert and oriented 3 moves all extremities nonfocal HEENT:  Atraumatic normocephalic EOMI PERRLA, mucous membranes, anicteric Cardiovascular:  Regular rate and rhythm no murmur clubbing cyanosis edema good capillary refill Lungs:  Clear to auscultation Abdomen:  Protuberant positive bowel sounds soft nontender nondistended no hepatosplenomegaly no masses Musculoskeletal:  No red warm swollen tender deformed joints Skin:  Warm dry no rash  LABS:  BMET  Recent Labs Lab 04/13/17 0531  NA 132*  K 5.4*  CL 97*  CO2 29  BUN 16  CREATININE 0.92  GLUCOSE 161*    Electrolytes  Recent Labs Lab 04/13/17 0531  CALCIUM 8.6*    CBC  Recent Labs Lab 04/13/17 0531 04/13/17 0944  WBC 11.3*  --   HGB 10.5* 10.1*  HCT 30.4* 29.2*  PLT 210  --     Coag's  Recent Labs Lab 04/13/17 0944  INR 1.13    Sepsis Markers No results for input(s): LATICACIDVEN, PROCALCITON, O2SATVEN in the last 168 hours.  ABG No results for input(s): PHART, PCO2ART, PO2ART in the last 168 hours.  Liver Enzymes  Recent Labs Lab 04/13/17 0531  AST 46*  ALT 21  ALKPHOS 98  BILITOT 1.7*  ALBUMIN 2.9*    Cardiac Enzymes No results for input(s): TROPONINI, PROBNP in the last 168 hours.  Glucose No results for input(s): GLUCAP in the last 168 hours.  Imaging Dg Chest Port 1 View  Result Date: 04/13/2017 CLINICAL DATA:  Pt c/o hemoptysis and blood in  stool with two episodes of each since last night. Hx of cirrhosis and esophageal varices. EXAM: PORTABLE CHEST 1 VIEW COMPARISON:  09/02/2013 FINDINGS: The heart size and mediastinal contours are within normal limits. Both lungs are clear. The visualized skeletal structures are unremarkable. IMPRESSION: No active disease. Electronically Signed   By: Nolon Nations M.D.   On: 04/13/2017 09:26     STUDIES:  Chest x-ray 9/30 shows no acute disease EKG 9/30 shows sinus rhythm nonischemic  CULTURES: None  ANTIBIOTICS: Ceftriaxone 1 g daily 9/30  SIGNIFICANT EVENTS: 9/30 admitted to medical ICU for GI bleed pending endoscopy  LINES/TUBES: Peripheral IVs  DISCUSSION: Upper GI bleed and acute blood loss anemia in a cirrhotic patient raises concern for variceal bleed. Patient is asymptomatic other than nausea and appears hemodynamically stable.  ASSESSMENT / PLAN:  PULMONARY A: No active issue P:   Monitor pulse ox  CARDIOVASCULAR A:  No active issue P:  Telemetry monitor blood pressure, hold diuretics and beta blocker  RENAL A:   Increased BUN/creatinine ratio P:   IV fluid resuscitation  GASTROINTESTINAL A:   Cirrhosis Upper GI bleed P:   Protonix infusion, octreotide infusion GI consult  HEMATOLOGIC A:   Acute blood loss  anemia P:  Monitor CBC  INFECTIOUS A:   Possible spontaneous bacterial peritonitis P:   Rocephin  ENDOCRINE A:   No active issues   P:   Monitor glucose on BMP  NEUROLOGIC A:   No active issues P:   RASS goal: 0   FAMILY  - Updates: Wife bedside  - Inter-disciplinary family meet or Palliative Care meeting due by:  04/20/17  Condition: acute Prognosis: guarded Code Status: full  Charlesetta Garibaldi, MD  [place] Pager: [phone]   04/13/2017, 11:30 AM

## 2017-04-13 NOTE — Anesthesia Procedure Notes (Signed)
Date/Time: 04/13/2017 1:15 PM Performed by: Cynda Familia Pre-anesthesia Checklist: Patient being monitored, Suction available and Emergency Drugs available Oxygen Delivery Method: Simple face mask Placement Confirmation: positive ETCO2 and breath sounds checked- equal and bilateral Dental Injury: Teeth and Oropharynx as per pre-operative assessment  Comments: Extubated to simple face mask-- good AW--- VSS--- to pacu O2 intact

## 2017-04-13 NOTE — ED Notes (Signed)
Pt not able to void.  Will try again in 30 minutes.

## 2017-04-13 NOTE — ED Notes (Signed)
Pt went to Endo for procedure

## 2017-04-13 NOTE — Anesthesia Procedure Notes (Signed)
Procedure Name: Intubation Date/Time: 04/13/2017 12:48 PM Performed by: Cynda Familia Pre-anesthesia Checklist: Patient identified, Emergency Drugs available, Suction available and Patient being monitored Patient Re-evaluated:Patient Re-evaluated prior to induction Oxygen Delivery Method: Circle System Utilized Preoxygenation: Pre-oxygenation with 100% oxygen Induction Type: IV induction, Rapid sequence and Cricoid Pressure applied Ventilation: Mask ventilation without difficulty Laryngoscope Size: Miller and 2 Grade View: Grade I Tube type: Oral Number of attempts: 1 Airway Equipment and Method: Stylet Placement Confirmation: ETT inserted through vocal cords under direct vision,  positive ETCO2 and breath sounds checked- equal and bilateral Secured at: 22 cm Tube secured with: Tape Dental Injury: Teeth and Oropharynx as per pre-operative assessment  Comments: Smooth RSI by Sabra Heck-- intubation AM CRNA atraumatic- teeth and mouth as preop -- bilat BS Sabra Heck

## 2017-04-13 NOTE — ED Notes (Signed)
Pt c/o hemoptysis and blood in stool with two episodes of each. Hx of cirrhosis and esophageal varices.

## 2017-04-13 NOTE — Anesthesia Preprocedure Evaluation (Addendum)
Anesthesia Evaluation  Patient identified by MRN, date of birth, ID band Patient awake    Reviewed: Allergy & Precautions, H&P , NPO status , Patient's Chart, lab work & pertinent test results, reviewed documented beta blocker date and time   Airway Mallampati: I  TM Distance: >3 FB Neck ROM: Full    Dental no notable dental hx. (+) Dental Advisory Given, Chipped, Missing, Poor Dentition   Pulmonary Current Smoker,    Pulmonary exam normal breath sounds clear to auscultation       Cardiovascular hypertension, Pt. on medications and Pt. on home beta blockers Normal cardiovascular exam Rhythm:Regular Rate:Normal     Neuro/Psych Depression    GI/Hepatic PUD, GERD  ,(+) Cirrhosis   Esophageal Varices  substance abuse  alcohol use,   Endo/Other    Renal/GU      Musculoskeletal  (+) Arthritis ,   Abdominal   Peds  Hematology  (+) anemia ,   Anesthesia Other Findings Alcolholic Cirrhosis, Esophageal Varices  Reproductive/Obstetrics                            Anesthesia Physical  Anesthesia Plan  ASA: III and emergent  Anesthesia Plan: General   Post-op Pain Management:    Induction: Intravenous, Rapid sequence and Cricoid pressure planned  PONV Risk Score and Plan: 1 and Ondansetron  Airway Management Planned: Oral ETT  Additional Equipment:   Intra-op Plan:   Post-operative Plan: Extubation in OR  Informed Consent: I have reviewed the patients History and Physical, chart, labs and discussed the procedure including the risks, benefits and alternatives for the proposed anesthesia with the patient or authorized representative who has indicated his/her understanding and acceptance.     Plan Discussed with: CRNA and Surgeon  Anesthesia Plan Comments:         Anesthesia Quick Evaluation

## 2017-04-13 NOTE — ED Notes (Signed)
Pt made an unsuccessful attempt to provide urine specimen.

## 2017-04-13 NOTE — Transfer of Care (Signed)
Immediate Anesthesia Transfer of Care Note  Patient: Billy [name]  Procedure(s) Performed: ESOPHAGOGASTRODUODENOSCOPY (EGD) WITH PROPOFOL (N/A )  Patient Location: PACU  Anesthesia Type:General  Level of Consciousness: awake and alert   Airway & Oxygen Therapy: Patient Spontanous Breathing and Patient connected to face mask oxygen  Post-op Assessment: Report given to RN and Post -op Vital signs reviewed and stable  Post vital signs: Reviewed and stable  Last Vitals:  Vitals:   04/13/17 0522 04/13/17 1217  BP: (!) 140/114 122/63  Pulse: 87 73  Resp: 16 10  Temp: 37 C 36.8 C  SpO2: 98% 98%    Last Pain:  Vitals:   04/13/17 1217  TempSrc: Oral  PainSc: 6          Complications: No apparent anesthesia complications

## 2017-04-13 NOTE — Anesthesia Postprocedure Evaluation (Signed)
Anesthesia Post Note  Patient: Billy [name]  Procedure(s) Performed: ESOPHAGOGASTRODUODENOSCOPY (EGD) WITH PROPOFOL (N/A )     Patient location during evaluation: PACU Anesthesia Type: General Level of consciousness: awake and alert Pain management: pain level controlled Vital Signs Assessment: post-procedure vital signs reviewed and stable Respiratory status: spontaneous breathing, nonlabored ventilation and respiratory function stable Cardiovascular status: blood pressure returned to baseline and stable Postop Assessment: no apparent nausea or vomiting Anesthetic complications: no    Last Vitals:  Vitals:   04/13/17 1330 04/13/17 1400  BP: 133/66 131/63  Pulse:    Resp:    Temp: 36.8 C   SpO2:      Last Pain:  Vitals:   04/13/17 1217  TempSrc: Oral  PainSc: Rincon Ray Nellene Courtois

## 2017-04-13 NOTE — Op Note (Signed)
[facility] Patient Name: Billy [name] Procedure Date: 04/13/2017 MRN: [id number] Attending MD: Estill Cotta. Loletha Carrow , MD Date of Birth: 08/06/1945 CSN: [id number] Age: 71 Admit Type: Outpatient Procedure:                Upper GI endoscopy Indications:              Acute post hemorrhagic anemia, Hematemesis, Melena,                            Cirrhosis with UGI bleeding suspected esophageal                            varices Providers:                Mallie Mussel L. Loletha Carrow, MD, Cleda Daub, RN, William Dalton, Technician Referring MD:              Medicines:                General Anesthesia Complications:            No immediate complications. Estimated Blood Loss:     Estimated blood loss was minimal. Procedure:                Pre-Anesthesia Assessment:                           - Prior to the procedure, a History and Physical                            was performed, and patient medications and                            allergies were reviewed. The patient's tolerance of                            previous anesthesia was also reviewed. The risks                            and benefits of the procedure and the sedation                            options and risks were discussed with the patient.                            All questions were answered, and informed consent                            was obtained. Prior Anticoagulants: The patient has                            taken no previous anticoagulant or antiplatelet                            agents. ASA Grade Assessment:  IV - A patient with                            severe systemic disease that is a constant threat                            to life. After reviewing the risks and benefits,                            the patient was deemed in satisfactory condition to                            undergo the procedure.                           After obtaining informed consent, the endoscope  was                            passed under direct vision. Throughout the                            procedure, the patient's blood pressure, pulse, and                            oxygen saturations were monitored continuously. The                            EG-[id number] ([id number]) scope was introduced through the                            mouth, and advanced to the second part of duodenum.                            The upper GI endoscopy was accomplished without                            difficulty. The patient tolerated the procedure                            well. Scope In: Scope Out: Findings:      Three columns of non-bleeding grade III varices were found in the middle       third of the esophagus and in the lower third of the esophagus. They       were almost completely occluding the lumen. Stigmata of recent bleeding       were evident and a red wale sign was present. Six bands were       successfully placed, indeterminate for complete eradication. An       additional band was placed , but it fell off. There was scant oozing       after banding, as expected, but no ongoing bleeding at the end of the       procedure.      Diffuse mildly congested mucosa was found in the entire examined stomach       consistent with portal gastropathy.  Type 1 gastroesophageal varices (GOV1, esophageal varices which extend       along the lesser curvature) with no bleeding were found in the cardia.       There were no stigmata of recent bleeding.      The examined duodenum was normal. Impression:               - Recently bleeding grade III esophageal varices.                            Banded.                           - Congestive gastropathy.                           - Type 1 gastroesophageal varices (GOV1, esophageal                            varices which extend along the lesser curvature),                            without bleeding.                           - Normal examined  duodenum.                           - No specimens collected. Moderate Sedation:      GETA Recommendation:           - Return patient to ICU for ongoing care.                           - Clear liquid diet.                           - Continue present medications. Procedure Code(s):        --- Professional ---                           [phone], Esophagogastroduodenoscopy, flexible,                            transoral; with band ligation of esophageal/gastric                            varices Diagnosis Code(s):        --- Professional ---                           K74.60, Unspecified cirrhosis of liver                           I85.11, Secondary esophageal varices with bleeding                           K31.89, Other diseases of stomach and duodenum  I86.4, Gastric varices                           D62, Acute posthemorrhagic anemia                           K92.0, Hematemesis                           K92.1, Melena (includes Hematochezia) CPT copyright 2016 American Medical Association. All rights reserved. The codes documented in this report are preliminary and upon coder review may  be revised to meet current compliance requirements. Henry L. Loletha Carrow, MD 04/13/2017 1:23:14 PM This report has been signed electronically. Number of Addenda: 0

## 2017-04-13 NOTE — ED Triage Notes (Signed)
Patient complaining of feeling slow this morning. Patient is complaining of blood in stool. Patient states this started around midnight.

## 2017-04-13 NOTE — ED Provider Notes (Signed)
[facility] DEPT Provider Note   CSN: [id number] Arrival date & time: 04/13/17  0434     History   Chief Complaint Chief Complaint  Patient presents with  . Emesis  . Rectal Bleeding    HPI Billy [name] is a 71 y.o. male.  Patient is a 71 year old male with a history of cirrhosis, esophageal varices, portal gastropathy, diverticulosis, gastric ulcer, GERD presenting with hematemesis and rectal bleeding. Patient states last night after dinner may be around 8:00 PM he started having bloody emesis. He's had approximately 3-4 episodes of bloody emesis in the last 12 hours. Last episode of bloody emesis was at 2 AM. From midnight patient started having grossly bloody stools. He states it's mixed with dark and bright blood in stool appears to be dark in nature and his diarrhea. He continues to have bloody emesis and his last episode was after arrival to the hospital. Patient complains of feeling short of breath and lightheaded when he stands or ambulates. He denies any chest pain or syncope. Patient does not take any anticoagulation and denies any NSAIDs. Last time anything like this happened was approximately 4 years ago.    Emesis    Rectal Bleeding  Associated symptoms: vomiting     Past Medical History:  Diagnosis Date  . Acute posthemorrhagic anemia   . 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 Progressed from fatty liver to cirrhosis based upon review imaging. Manifestations are portal hypertension with esophageal varices, portal gastropathy, rectal varices and he may have portal colopathy. Ascites also present. Naive to HAV and HBV Claims he has had pneumococcal vaccine   . Alcoholism ([place])   . Angiodysplasia of colon 09/11/2011   Multiple in the right colon, ablated with APC. Question if this is portal colopathy.   . Back pain   . Basal cell carcinoma   . Bilateral varicoceles   . Campylobacter diarrhea 01/07/2012  . DDD (degenerative disc  disease)   . Depression    mild at most  . Diverticulosis of colon   . Erectile dysfunction   . Gastric ulcer 08/2011   small, antral ulcer  . GERD (gastroesophageal reflux disease)    takes Omeprazole prn  . GI hemorrhage 2006, 2013   esophagitis and 1+ varices, melena in 2013   . History of blood transfusion    as a child and no abnormal reaction noted  . History of bronchitis    >42yrs ago  . Hydrocele   . Hypertension    takes Nadolol daily  . Personal history of colonic polyps   . 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 Scrotal edema   . Sleep disturbance    takes Remeron nightly  . Thrombocytopenia, unspecified ([place])   . Varices, esophageal ([place]) 08/2011   Grade 2, mid-esophagus    Patient Active Problem List   Diagnosis Date Noted  . Leg pain, left 04/24/2016  . Ascites 11/20/2015  . Scrotal edema 11/20/2015  . Hydrocele in adult 11/20/2015  . Alcoholism in remission ([place]) 09/26/2014  . Thrombocytopenia ([place]) 09/26/2014  . Routine general medical examination at a health care facility 09/16/2012  . 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 Esophageal varices - small - no bleeding hx - on beta blocker 09/11/2011  . Portal hypertension ([place]) 09/11/2011  . Angiodysplasia of colon 09/11/2011  . 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 Personal history of colonic polyps 09/05/2011  . DEGENERATIVE DISC DISEASE, LUMBAR  SPINE 10/12/2007  . SLEEP DISORDER 10/12/2007  . Essential hypertension, benign 08/06/2006  . GERD 08/06/2006  . DIVERTICULOSIS, COLON 08/06/2006  . Chronic back pain greater than 3 months duration 08/06/2006    Past Surgical History:  Procedure Laterality Date  . CATARACT EXTRACTION  2013   bilateral with IOL  . CHOLECYSTECTOMY  12/31/2012   Procedure: LAPAROSCOPIC CHOLECYSTECTOMY;  Surgeon: Zenovia Jarred, MD;  Location: [facility];  Service: General;;  . COLONOSCOPY  2001, 200411/02/2006  . COLONOSCOPY  09/11/2011    Procedure: COLONOSCOPY;  Surgeon: Gatha Mayer, MD;  Location: [facility];  Service: Endoscopy;  Laterality: N/A;  . ESOPHAGOGASTRODUODENOSCOPY  2006  . ESOPHAGOGASTRODUODENOSCOPY  09/11/2011   Procedure: ESOPHAGOGASTRODUODENOSCOPY (EGD);  Surgeon: Gatha Mayer, MD;  Location: [facility];  Service: Endoscopy;  Laterality: N/A;  . EYE SURGERY    . FLEXIBLE SIGMOIDOSCOPY  01/07/2012   Procedure: FLEXIBLE SIGMOIDOSCOPY;  Surgeon: Lafayette Dragon, MD;  Location: [facility];  Service: Endoscopy;  Laterality: N/A;  . KNEE ARTHROSCOPY  2008   Right  . KNEE ARTHROSCOPY  2008   Left  . [place] THORA/PARACENTESIS  10/28/2011  . Varicocele repair  1980's       Home Medications    Prior to Admission medications   Medication Sig Start Date End Date Taking? Authorizing Provider  Cholecalciferol (VITAMIN D3) 2000 UNITS TABS Take 2,000 Units by mouth daily.    [provider]  furosemide (LASIX) 40 MG tablet TAKE 1 TABLET (40 MG TOTAL) BY MOUTH DAILY. 12/23/16   Gatha Mayer, MD  HYDROcodone-acetaminophen (NORCO/VICODIN) 5-325 MG tablet TAKE 1 TABLET BY MOUTH 3 TIMES A DAY AS NEEDED 04/04/17   Jearld Fenton, NP  KRILL OIL PO Take 1 capsule by mouth every evening. 350 mg    [provider]  mirtazapine (REMERON) 45 MG tablet TAKE 1 TABLET (45 MG TOTAL) BY MOUTH AT BEDTIME. 01/06/17   [name], MD  Multiple Vitamin (MULTIVITAMIN WITH MINERALS) TABS Take 1 tablet by mouth daily.    [provider]  nadolol (CORGARD) 40 MG tablet TAKE 1 TABLET (40 MG TOTAL) BY MOUTH DAILY. 10/24/16   Gatha Mayer, MD  omeprazole (PRILOSEC) 40 MG capsule TAKE 1 CAPSULE (40 MG TOTAL) BY MOUTH DAILY. 11/21/16   [name], MD  PREVIDENT 5000 DRY MOUTH 1.1 % GEL dental gel as directed. 02/27/16   [provider]  psyllium (METAMUCIL) 58.6 % powder Take 1 packet by mouth daily.     [provider]  spironolactone (ALDACTONE) 50 MG tablet Take 2 tablets (100 mg  total) by mouth daily. 12/31/16   Gatha Mayer, MD  vitamin C (ASCORBIC ACID) 500 MG tablet Take 500 mg by mouth daily.    [provider]    Family History Family History  Problem Relation Age of Onset  . Stroke Mother   . Diabetes Mother   . Liver cancer Cousin   . Liver cancer Paternal Uncle   . Colon cancer Neg Hx   . Stomach cancer Neg Hx   . Esophageal cancer Neg Hx     Social History Social History  Substance Use Topics  . Smoking status: Current Some Day Smoker    Packs/day: 0.50    Types: Cigarettes  . Smokeless tobacco: Never Used     Comment: current using E cigarette, also  . Alcohol use No     Allergies   Bee venom; Nsaids; and Other  Review of Systems Review of Systems  Gastrointestinal: Positive for hematochezia and vomiting.  All other systems reviewed and are negative.    Physical Exam Updated Vital Signs BP (!) 140/114 (BP Location: Left Arm)   Pulse 87   Temp 98.6 F (37 C) (Oral)   Resp 16   Ht 6\' 2"  (1.88 m)   Wt 78.9 kg (174 lb)   SpO2 98%   BMI 22.34 kg/m   Physical Exam  Constitutional: He is oriented to person, place, and time. He appears well-developed and well-nourished. No distress.  HENT:  Head: Normocephalic and atraumatic.  Mouth/Throat: Oropharynx is clear and moist.  Eyes: Pupils are equal, round, and reactive to light. EOM are normal.  Pale conjunctiva  Neck: Normal range of motion. Neck supple.  Cardiovascular: Normal rate, regular rhythm and intact distal pulses.   No murmur heard. Pulmonary/Chest: Effort normal and breath sounds normal. No respiratory distress. He has no wheezes. He has no rales.  Abdominal: Soft. He exhibits no distension. There is no tenderness. There is no rebound and no guarding.  Genitourinary: Rectal exam shows guaiac positive stool. Rectal exam shows no external hemorrhoid.  Genitourinary Comments: Grossly bloody on inspection with digital rectal exam  Musculoskeletal: Normal  range of motion. He exhibits no edema or tenderness.  Neurological: He is alert and oriented to person, place, and time.  Skin: Skin is warm and dry. No rash noted. No erythema. There is pallor.  Psychiatric: He has a normal mood and affect. His behavior is normal.  Nursing note and vitals reviewed.    ED Treatments / Results  Labs (all labs ordered are listed, but only abnormal results are displayed) Labs Reviewed  COMPREHENSIVE METABOLIC PANEL - Abnormal; Notable for the following:       Result Value   Sodium 132 (*)    Potassium 5.4 (*)    Chloride 97 (*)    Glucose, Bld 161 (*)    Calcium 8.6 (*)    Albumin 2.9 (*)    AST 46 (*)    Total Bilirubin 1.7 (*)    All other components within normal limits  CBC - Abnormal; Notable for the following:    WBC 11.3 (*)    RBC 3.20 (*)    Hemoglobin 10.5 (*)    HCT 30.4 (*)    All other components within normal limits  POC OCCULT BLOOD, ED - Abnormal; Notable for the following:    Fecal Occult Bld POSITIVE (*)    All other components within normal limits  LIPASE, BLOOD  URINALYSIS, ROUTINE W REFLEX MICROSCOPIC  AMMONIA  PROTIME-INR  POC OCCULT BLOOD, ED  TYPE AND SCREEN    EKG  EKG Interpretation  Date/Time:  Sunday April 13 2017 08:40:49 EDT Ventricular Rate:  87 PR Interval:    QRS Duration: 102 QT Interval:  376 QTC Calculation: 453 R Axis:   82 Text Interpretation:  Sinus rhythm Consider right ventricular hypertrophy No significant change since last tracing Confirmed by Blanchie Dessert [phone]) on 04/13/2017 9:04:10 AM       Radiology No results found.  Procedures Procedures (including critical care time)  Medications Ordered in ED Medications  pantoprazole (PROTONIX) 80 mg in sodium chloride 0.9 % 100 mL IVPB (not administered)  pantoprazole (PROTONIX) 80 mg in sodium chloride 0.9 % 250 mL (0.32 mg/mL) infusion (not administered)  pantoprazole (PROTONIX) injection 40 mg (not administered)  sodium  chloride 0.9 % bolus 500 mL (not administered)  Initial Impression / Assessment and Plan / ED Course  I have reviewed the triage vital signs and the nursing notes.  Pertinent labs & imaging results that were available during my care of the patient were reviewed by me and considered in my medical decision making (see chart for details).    71 year old male with a history of cirrhosis presenting today with upper and lower GI bleeding. Symptoms started around 8 or 9 PM last night. He had 2-3 episodes of hematemesis and continued bloody stools. Patient is having mild symptoms of anemia at this time. Currently vital signs are stable. Grossly bloody stool on Hemoccult testing.  Patient still drinks occasionally but denies any recent alcohol use. Also denies any anticoagulation or NSAID use. Patient's last follow-up with GI was in July of this year for routine follow-up.  Labs show a 4 g drop in hemoglobin from 14-10. Type and screen sent. Patient started on a Protonix drip. We'll discuss with GI whether they won the patient had octreotide as patient does have cirrhosis and known varices.  Patient will require admission. 8:57 AM Pt started on octreotide as well and INR pending as he may need FFP.  [facility] aware and following.  Pt to go to ICU  CRITICAL CARE Performed by: Blanchie Dessert Total critical care time: 30 minutes Critical care time was exclusive of separately billable procedures and treating other patients. Critical care was necessary to treat or prevent imminent or life-threatening deterioration. Critical care was time spent personally by me on the following activities: development of treatment plan with patient and/or surrogate as well as nursing, discussions with consultants, evaluation of patient's response to treatment, examination of patient, obtaining history from patient or surrogate, ordering and performing treatments and interventions, ordering and review of laboratory studies,  ordering and review of radiographic studies, pulse oximetry and re-evaluation of patient's condition.   Final Clinical Impressions(s) / ED Diagnoses   Final diagnoses:  Acute GI bleeding  Anemia, unspecified type    New Prescriptions New Prescriptions   No medications on file     Blanchie Dessert, MD 04/13/17 [phone]

## 2017-04-14 ENCOUNTER — Encounter (HOSPITAL_COMMUNITY): Payer: Self-pay | Admitting: Gastroenterology

## 2017-04-14 DIAGNOSIS — K7030 Alcoholic cirrhosis of liver without ascites: Secondary | ICD-10-CM

## 2017-04-14 DIAGNOSIS — K922 Gastrointestinal hemorrhage, unspecified: Secondary | ICD-10-CM

## 2017-04-14 DIAGNOSIS — I8511 Secondary esophageal varices with bleeding: Principal | ICD-10-CM

## 2017-04-14 LAB — CBC
HCT: 21.8 % — ABNORMAL LOW (ref 39.0–52.0)
HEMATOCRIT: 22.9 % — AB (ref 39.0–52.0)
HEMOGLOBIN: 7.9 g/dL — AB (ref 13.0–17.0)
Hemoglobin: 7.6 g/dL — ABNORMAL LOW (ref 13.0–17.0)
MCH: 32.5 pg (ref 26.0–34.0)
MCH: 32.5 pg (ref 26.0–34.0)
MCHC: 34.5 g/dL (ref 30.0–36.0)
MCHC: 34.9 g/dL (ref 30.0–36.0)
MCV: 93.2 fL (ref 78.0–100.0)
MCV: 94.2 fL (ref 78.0–100.0)
PLATELETS: 105 10*3/uL — AB (ref 150–400)
Platelets: 120 10*3/uL — ABNORMAL LOW (ref 150–400)
RBC: 2.34 MIL/uL — ABNORMAL LOW (ref 4.22–5.81)
RBC: 2.43 MIL/uL — AB (ref 4.22–5.81)
RDW: 15.2 % (ref 11.5–15.5)
RDW: 15.2 % (ref 11.5–15.5)
WBC: 5.9 10*3/uL (ref 4.0–10.5)
WBC: 6.6 10*3/uL (ref 4.0–10.5)

## 2017-04-14 LAB — BASIC METABOLIC PANEL
Anion gap: 6 (ref 5–15)
BUN: 19 mg/dL (ref 6–20)
CALCIUM: 7.8 mg/dL — AB (ref 8.9–10.3)
CO2: 25 mmol/L (ref 22–32)
CREATININE: 0.85 mg/dL (ref 0.61–1.24)
Chloride: 100 mmol/L — ABNORMAL LOW (ref 101–111)
GFR calc Af Amer: >60 mL/min (ref >60)
GFR calc non Af Amer: >60 mL/min (ref >60)
GLUCOSE: 113 mg/dL — AB (ref 65–99)
Potassium: 4.4 mmol/L (ref 3.5–5.1)
Sodium: 131 mmol/L — ABNORMAL LOW (ref 135–145)

## 2017-04-14 LAB — PHOSPHORUS: Phosphorus: 3.3 mg/dL (ref 2.5–4.6)

## 2017-04-14 LAB — HEMOGLOBIN AND HEMATOCRIT, BLOOD
HCT: 22 % — ABNORMAL LOW (ref 39.0–52.0)
Hemoglobin: 7.6 g/dL — ABNORMAL LOW (ref 13.0–17.0)

## 2017-04-14 LAB — MAGNESIUM: Magnesium: 1.5 mg/dL — ABNORMAL LOW (ref 1.7–2.4)

## 2017-04-14 MED ORDER — MAGNESIUM SULFATE 4 GM/100ML IV SOLN
4.0000 g | Freq: Once | INTRAVENOUS | Status: AC
  Administered 2017-04-14: 4 g via INTRAVENOUS
  Filled 2017-04-14: qty 100

## 2017-04-14 NOTE — Progress Notes (Signed)
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 In the emergency department he was found to be hemodynamically stable however he was noted to have a decrease in his hemoglobin of 4 g from baseline. Gastroenterology was consulted.  He is being admitted to the medical ICU for close monitoring until gastroenterology is able to perform EGD. He continues to pass bloody bowel movements however he has stopped having emesis. He complains of nausea. He denies dizziness, syncope, chest pain, palpitations, abdominal pain, fevers, increasing abdominal size, prior episodes of spontaneous bacterial peritonitis.   SUBJECTIVE: No distress, wants to eat VITAL SIGNS: BP (!) 95/41   Pulse 66   Temp 98.6 F (37 C) (Oral)   Resp 14   Ht 6\' 2"  (1.88 m)   Wt 176 lb 12.9 oz (80.2 kg)   SpO2 98%   BMI 22.70 kg/m   HEMODYNAMICS:    VENTILATOR SETTINGS:    INTAKE / OUTPUT: I/O last 3 completed shifts: In: 2561.3 [P.O.:240; I.V.:2321.3] Out: 750 [Urine:750]  PHYSICAL EXAMINATION: Gen.: 71 year old white male resting comfortably in bed no acute distress HEENT: Normocephalic atraumatic no JVD mucous membranes are moist. Cardiac: Regular rate and rhythm pulmonary: Clear to auscultation and without accessory muscle use abdomen: Soft nontender no organomegaly positive bowel sounds extremity: Warm dry, no edema neuro: Awake alert oriented and no focal deficits. LABS:  BMET  Recent Labs Lab 04/13/17 0531 04/14/17 0033  NA 132* 131*  K 5.4* 4.4  CL 97* 100*  CO2 29 25  BUN 16 19  CREATININE 0.92 0.85   GLUCOSE 161* 113*    Electrolytes  Recent Labs Lab 04/13/17 0531 04/14/17 0033  CALCIUM 8.6* 7.8*  MG  --  1.5*  PHOS  --  3.3    CBC  Recent Labs Lab 04/13/17 0531 04/13/17 0944 04/13/17 1539 04/14/17 0033  WBC 11.3*  --   --  5.9  HGB 10.5* 10.1* 8.8* 7.6*  HCT 30.4* 29.2* 25.7* 21.8*  PLT 210  --   --  105*    Coag's  Recent Labs Lab 04/13/17 0944  INR 1.13    Sepsis Markers No results for input(s): LATICACIDVEN, PROCALCITON, O2SATVEN in the last 168 hours.  ABG No results for input(s): PHART, PCO2ART, PO2ART in the last 168 hours.  Liver Enzymes  Recent Labs Lab 04/13/17 0531  AST 46*  ALT 21  ALKPHOS 98  BILITOT 1.7*  ALBUMIN 2.9*    Cardiac Enzymes No results for input(s): TROPONINI, PROBNP in the last 168 hours.  Glucose No results for input(s): GLUCAP in the last 168 hours.  Imaging No results found.   STUDIES:  Chest x-ray 9/30 shows no acute disease EKG 9/30 shows sinus rhythm nonischemic EGD 9/30: Three columns of non-bleeding grade III varices were found in the middle third of the esophagus and in the lower third of the esophagus. They were almost completely occluding the lumen. Stigmata of recent bleeding were evident and a red wale sign was present. Six bands were successfully placed, indeterminate for complete eradication. An additional band was placed , but it fell  off. There was scant oozing after banding, as expected, but no ongoing bleeding at the end of the procedure.  CULTURES: None  ANTIBIOTICS: Ceftriaxone 1 g daily 9/30  SIGNIFICANT EVENTS: 9/30 admitted to medical ICU for GI bleed pending endoscopy  LINES/TUBES: Peripheral IVs  DISCUSSION: Upper GI bleed and acute blood loss anemia in a cirrhotic patient raises concern for variceal bleed. Patient is asymptomatic other than nausea and appears hemodynamically stable. Now s/p banding Ask [facility] to assume care   ASSESSMENT / PLAN:  Alcohol-related  cirrhosis Congestive gastropathy Esophageal varices Variceal bleed Acute blood loss anemia Thrombocytopenia History of tobacco abuse Hypomagnesemia Hyperglycemia    Acute upper GI bleed/bleeding esophageal varices in the setting of congestive gastropathy due to alcohol-induced cirrhosis Possible spontaneous bacterial peritonitis: h/o ascites  Status post EGD with banding on 9/30 His hemoglobin has drifted down almost 3 g over the last 24 hours. Hopefully this is admixed with dilution effect and he has reached his nadir of blood loss. Nursing does not report any bloody bowel movements overnight Plan: Continuing octreotide and Protonix infusion Clear liquid diet Serial CBC Transfuse per protocol Date 2 of 3 Rocephin for empiric SBP  Acute blood loss anemia Plan Trend CBC No anticoagulation Please see above section regarding GI bleed  Mild thrombocytopenia the setting of cirrhosis as well as acute blood loss. Plan Trend CBC  History of tobacco abuse Plan Consider nicotine patch if patient requests   Hypomagnesemia Plan: Replace and recheck in a.m.  Intermittent hyper glycemia Plan: Monitor glucose   FAMILY  - Updates: Wife bedside  - Inter-disciplinary family meet or Palliative Care meeting due by:  04/20/17  [facility] to assume care  Erick Colace ACNP-BC [place] Pager # [phone] OR # [phone] if no answer   04/14/2017, 9:17 AM

## 2017-04-14 NOTE — Care Management Note (Signed)
Case Management Note  Patient Details  Name: Billy [name] MRN: [id number] Date of Birth: [birth year]  Subjective/Objective:                  71 year old man past medical history of alcoholic cirrhosis with a known history of varices and one prior episode of GI bleed remotely presented to the emergency department with complaints of throwing up blood and passing bloody stools. In the emergency department he was found to be hemodynamically stable however he was noted to have a decrease in his hemoglobin of 4 g from baseline. Gastroenterology was consulted.  He is being admitted to the medical ICU for close monitoring until gastroenterology is able to perform EGD. He continues to pass bloody bowel movements however he has stopped having emesis. He complains of nausea. He denies dizziness, syncope, chest pain, palpitations, abdominal pain, fevers, increasing abdominal size, prior episodes of spontaneous bacterial peritonitis. Action/Plan: Date:  April 14, 2017 Chart reviewed for concurrent status and case management needs.  Will continue to follow patient progress.  Discharge Planning: following for needs  Expected discharge date: April 17, 2017  Velva Harman, BSN, [place], [place]   Expected Discharge Date:                  Expected Discharge Plan:  Home/Self Care  In-House Referral:     Discharge planning Services  CM Consult  Post Acute Care Choice:    Choice offered to:     DME Arranged:    DME Agency:     HH Arranged:    HH Agency:     Status of Service:  In process, will continue to follow  If discussed at Long Length of Stay Meetings, dates discussed:    Additional Comments:  Leeroy Cha, RN 04/14/2017, 9:47 AM

## 2017-04-14 NOTE — Progress Notes (Signed)
[facility] Progress Note  Chief Complaint:    Cirrhosis, upper GI bleed  Subjective: Feels okay, no further bleeding. Tolerating clear  Objective:  Vital signs in last 24 hours: Temp:  [98.2 F (36.8 C)-98.7 F (37.1 C)] 98.6 F (37 C) (10/01 0700) Pulse Rate:  [66-92] 66 (10/01 0600) Resp:  [10-23] 14 (10/01 0600) BP: (82-133)/(29-106) 95/41 (10/01 0600) SpO2:  [90 %-100 %] 98 % (10/01 0600) Weight:  [174 lb (78.9 kg)-176 lb 12.9 oz (80.2 kg)] 176 lb 12.9 oz (80.2 kg) (10/01 0500) Last BM Date: 04/13/17 General:   Alert, thin white male in NAD EENT:  Normal hearing, non icteric sclera, conjunctive pink.  Heart:  Regular rate and rhythm; no murmurs. No lower extremity edema Pulm: Normal respiratory effort, lungs CTA bilaterally without wheezes or crackles. Abdomen:  Soft, mildly distended, nontender.  Normal bowel sounds Neurologic:  Alert and  oriented x4;  grossly normal neurologically. Psych:  Pleasant, cooperative.  Normal mood and affect.   Intake/Output from previous day: 09/30 0701 - 10/01 0700 In: 2561.3 [P.O.:240; I.V.:2321.3] Out: 750 [Urine:750] Intake/Output this shift: No intake/output data recorded.  Lab Results:  Recent Labs  04/13/17 0531 04/13/17 0944 04/13/17 1539 04/14/17 0033  WBC 11.3*  --   --  5.9  HGB 10.5* 10.1* 8.8* 7.6*  HCT 30.4* 29.2* 25.7* 21.8*  PLT 210  --   --  105*   BMET  Recent Labs  04/13/17 0531 04/14/17 0033  NA 132* 131*  K 5.4* 4.4  CL 97* 100*  CO2 29 25  GLUCOSE 161* 113*  BUN 16 19  CREATININE 0.92 0.85  CALCIUM 8.6* 7.8*   LFT  Recent Labs  04/13/17 0531  PROT 7.0  ALBUMIN 2.9*  AST 46*  ALT 21  ALKPHOS 98  BILITOT 1.7*   PT/INR  Recent Labs  04/13/17 0944  LABPROT 14.4  INR 1.13     Dg Chest Port 1 View  Result Date: 04/13/2017 CLINICAL DATA:  Pt c/o hemoptysis and blood in stool with two episodes of each since last night. Hx of cirrhosis and esophageal varices.  EXAM: PORTABLE CHEST 1 VIEW COMPARISON:  09/02/2013 FINDINGS: The heart size and mediastinal contours are within normal limits. Both lungs are clear. The visualized skeletal structures are unremarkable. IMPRESSION: No active disease. Electronically Signed   By: Nolon Nations M.D.   On: 04/13/2017 09:26    ASSESSMENT / PLAN:   Decompensated ETOH cirrhosis / hemodynamically stable variceal bleeding s/p banding. Received 2 units of blood. Hgb declined some overnight (8.8 >>>7.6) but no further bleeding.  -continue Octreotide and PPI gtt.  -Continue rocephin for SBP prophylaxis.  -small volume ascites on last ultrasound in Nov 2017. On lasix 40 / aldactone 100 at home. Both still on hold. Renal function normal. Will need to resume diuretics soon.  -continue clears, advance diet to 2gm sodium tomorrow.  -Patient is known to Dr. Carlean Purl who will follow up for outpatient EGD with banding -[facility] surveillance. AFP normal. Needs ultrasound, last one was November. Can be done outpatient.    Active Problems:   GI bleed     LOS: 1 day   Tye Savoy ,NP 04/14/2017, 8:57 AM  Pager number [phone]     Attending physician's note   I have taken an interval history, reviewed the chart and examined the patient. I agree with the Advanced Practitioner's note, impression and recommendations. Decompensated cirrhosis with an acute variceal bleed s/p banding yesterday. No  recurrent bleeding. Hb likely equilibrating. Trend CBC. Continue clears, IV octreotide, IV PPI, IV rocephin. Continue to hold diuretics today.   Lucio Edward, MD [facility] [phone] Mon-Fri 8a-5p [phone] after 5p, weekends, holidays

## 2017-04-15 ENCOUNTER — Ambulatory Visit: Payer: 59 | Admitting: Internal Medicine

## 2017-04-15 DIAGNOSIS — D649 Anemia, unspecified: Secondary | ICD-10-CM

## 2017-04-15 DIAGNOSIS — D62 Acute posthemorrhagic anemia: Secondary | ICD-10-CM

## 2017-04-15 DIAGNOSIS — R042 Hemoptysis: Secondary | ICD-10-CM

## 2017-04-15 LAB — COMPREHENSIVE METABOLIC PANEL
ALT: 15 U/L — ABNORMAL LOW (ref 17–63)
AST: 35 U/L (ref 15–41)
Albumin: 2.1 g/dL — ABNORMAL LOW (ref 3.5–5.0)
Alkaline Phosphatase: 64 U/L (ref 38–126)
Anion gap: 5 (ref 5–15)
BUN: 15 mg/dL (ref 6–20)
CO2: 25 mmol/L (ref 22–32)
Calcium: 7.6 mg/dL — ABNORMAL LOW (ref 8.9–10.3)
Chloride: 103 mmol/L (ref 101–111)
Creatinine, Ser: 0.76 mg/dL (ref 0.61–1.24)
GFR calc Af Amer: >60 mL/min (ref >60)
GFR calc non Af Amer: >60 mL/min (ref >60)
Glucose, Bld: 90 mg/dL (ref 65–99)
Potassium: 4.3 mmol/L (ref 3.5–5.1)
Sodium: 133 mmol/L — ABNORMAL LOW (ref 135–145)
Total Bilirubin: 1 mg/dL (ref 0.3–1.2)
Total Protein: 5 g/dL — ABNORMAL LOW (ref 6.5–8.1)

## 2017-04-15 LAB — CBC
HCT: 21.2 % — ABNORMAL LOW (ref 39.0–52.0)
Hemoglobin: 7.4 g/dL — ABNORMAL LOW (ref 13.0–17.0)
MCH: 33 pg (ref 26.0–34.0)
MCHC: 34.9 g/dL (ref 30.0–36.0)
MCV: 94.6 fL (ref 78.0–100.0)
Platelets: 95 10*3/uL — ABNORMAL LOW (ref 150–400)
RBC: 2.24 MIL/uL — ABNORMAL LOW (ref 4.22–5.81)
RDW: 15.3 % (ref 11.5–15.5)
WBC: 4.6 10*3/uL (ref 4.0–10.5)

## 2017-04-15 LAB — MAGNESIUM: Magnesium: 2 mg/dL (ref 1.7–2.4)

## 2017-04-15 NOTE — Progress Notes (Signed)
PROGRESS NOTE    Billy [name]  SKA:[id number] DOB: [birth year] DOA: 04/13/2017 PCP: [name], MD    Brief Narrative:  [phone] with hx of etoh cirrhosis and known esophageal varices who presented to critical care service with hematemesis and bloody stools, found to have hgb of 4.  Assessment & Plan:   Principal Problem:   Secondary esophageal varices with bleeding (HCC) Active Problems:   Essential hypertension, benign   Alcoholic cirrhosis of liver (HCC)   GI bleed   Acute GI bleeding   Acute blood loss anemia   1. Bleeding esophageal varices 1. GI following 2. Pt s/p EGD with variceal banding 9/30 3. GI recommendations to continue IV octreotide, PPI x one more day 4. Hgb stable overnight, 7.4 from 7.9. Hemodynamically stable 2. Acute blood loss anemia 1. 2 units PRBC's transfused 2. Repeat CBC in AM 3. HTN 1. bp currently stable at 109/50 2. Cont current regimen 4. ETOH cirrhosis 1. Stable at present 2. Pt was started on rocephin empirically for SBP prophylaxis. Afebrile. Not encephalopathic  DVT prophylaxis: SCD's Code Status: Full Family Communication: Pt in room, family not at bedside Disposition Plan: Uncertain at this time  Consultants:   GI  Critical Care  Procedures:   EGD 04/13/17  Antimicrobials: Anti-infectives    Start     Dose/Rate Route Frequency Ordered Stop   04/13/17 1200  cefTRIAXone (ROCEPHIN) 1 g in dextrose 5 % 50 mL IVPB     1 g 100 mL/hr over 30 Minutes Intravenous Every 24 hours 04/13/17 1014        Subjective: No complaints  Objective: Vitals:   04/15/17 0800 04/15/17 1200 04/15/17 1400 04/15/17 1600  BP: (!) 108/42 (!) 106/46 (!) 109/50   Pulse: 70 74 78   Resp: (!) 8 18 (!) 21   Temp: 98.7 F (37.1 C) 99.1 F (37.3 C)  98.7 F (37.1 C)  TempSrc: Oral Oral  Oral  SpO2: 94% 96% 95%   Weight:      Height:        Intake/Output Summary (Last 24 hours) at 04/15/17 1622 Last data filed at 04/15/17 1500  Gross per 24 hour  Intake              250 ml  Output              400 ml  Net             -150 ml   Filed Weights   04/13/17 1217 04/14/17 0500 04/15/17 0420  Weight: 78.9 kg (174 lb) 80.2 kg (176 lb 12.9 oz) 81.7 kg (180 lb 1.9 oz)    Examination:  General exam: Appears calm and comfortable  Respiratory system: Clear to auscultation. Respiratory effort normal. Cardiovascular system: S1 & S2 heard, RRR.  Gastrointestinal system: Abdomen is nondistended, soft and nontender. No organomegaly or masses felt. Normal bowel sounds heard. Central nervous system: Alert and oriented. No focal neurological deficits. Extremities: Symmetric 5 x 5 power. Skin: No rashes, lesions  Psychiatry: Judgement and insight appear normal. Mood & affect appropriate.   Data Reviewed: I have personally reviewed following labs and imaging studies  CBC:  Recent Labs Lab 04/13/17 0531  04/13/17 1539 04/14/17 0033 04/14/17 0830 04/14/17 1617 04/15/17 0326  WBC 11.3*  --   --  5.9  --  6.6 4.6  HGB 10.5*  < > 8.8* 7.6* 7.6* 7.9* 7.4*  HCT 30.4*  < > 25.7* 21.8* 22.0* 22.9* 21.2*  MCV 95.0  --   --  93.2  --  94.2 94.6  PLT 210  --   --  105*  --  120* 95*  < > = values in this interval not displayed. Basic Metabolic Panel:  Recent Labs Lab 04/13/17 0531 04/14/17 0033 04/15/17 0326  NA 132* 131* 133*  K 5.4* 4.4 4.3  CL 97* 100* 103  CO2 29 25 25   GLUCOSE 161* 113* 90  BUN 16 19 15   CREATININE 0.92 0.85 0.76  CALCIUM 8.6* 7.8* 7.6*  MG  --  1.5* 2.0  PHOS  --  3.3  --    GFR: Estimated Creatinine Clearance: 100.7 mL/min (by C-G formula based on SCr of 0.76 mg/dL). Liver Function Tests:  Recent Labs Lab 04/13/17 0531 04/15/17 0326  AST 46* 35  ALT 21 15*  ALKPHOS 98 64  BILITOT 1.7* 1.0  PROT 7.0 5.0*  ALBUMIN 2.9* 2.1*    Recent Labs Lab 04/13/17 0531  LIPASE 36    Recent Labs Lab 04/13/17 0944  AMMONIA 32   Coagulation Profile:  Recent Labs Lab 04/13/17 0944    INR 1.13   Cardiac Enzymes: No results for input(s): CKTOTAL, CKMB, CKMBINDEX, TROPONINI in the last 168 hours. BNP (last 3 results) No results for input(s): PROBNP in the last 8760 hours. HbA1C: No results for input(s): HGBA1C in the last 72 hours. CBG: No results for input(s): GLUCAP in the last 168 hours. Lipid Profile: No results for input(s): CHOL, HDL, LDLCALC, TRIG, CHOLHDL, LDLDIRECT in the last 72 hours. Thyroid Function Tests: No results for input(s): TSH, T4TOTAL, FREET4, T3FREE, THYROIDAB in the last 72 hours. Anemia Panel: No results for input(s): VITAMINB12, FOLATE, FERRITIN, TIBC, IRON, RETICCTPCT in the last 72 hours. Sepsis Labs: No results for input(s): PROCALCITON, LATICACIDVEN in the last 168 hours.  Recent Results (from the past 240 hour(s))  MRSA PCR Screening     Status: None   Collection Time: 04/13/17  4:00 PM  Result Value Ref Range Status   MRSA by PCR NEGATIVE NEGATIVE Final    Comment:        The GeneXpert MRSA Assay (FDA approved for NASAL specimens only), is one component of a comprehensive MRSA colonization surveillance program. It is not intended to diagnose MRSA infection nor to guide or monitor treatment for MRSA infections.      Radiology Studies: No results found.  Scheduled Meds: . mouth rinse  15 mL Mouth Rinse BID  . [START ON 04/16/2017] pantoprazole  40 mg Intravenous Q12H   Continuous Infusions: . sodium chloride    . cefTRIAXone (ROCEPHIN)  IV Stopped (04/15/17 1259)  . octreotide  (SANDOSTATIN)    IV infusion 50 mcg/hr (04/15/17 0428)  . pantoprozole (PROTONIX) infusion 8 mg/hr (04/15/17 1443)     LOS: 2 days   Kyngston Pickelsimer, Orpah Melter, MD [facility] Pager [phone]  If 7PM-7AM, please contact night-coverage [email or web address] Password [email or web address] 04/15/2017, 4:22 PM

## 2017-04-15 NOTE — Progress Notes (Signed)
    Progress Note   Subjective   Had small amount of BRB with bowel movement today. Otherwise no complaints.    Objective  Vital signs in last 24 hours: Temp:  [98 F (36.7 C)-98.7 F (37.1 C)] 98.7 F (37.1 C) (10/02 0800) Pulse Rate:  [67-85] 67 (10/02 0600) Resp:  [12-39] 15 (10/02 0600) BP: (84-114)/(27-57) 93/47 (10/02 0600) SpO2:  [89 %-98 %] 92 % (10/02 0600) Weight:  [180 lb 1.9 oz (81.7 kg)] 180 lb 1.9 oz (81.7 kg) (10/02 0420) Last BM Date: 04/13/17  General: Alert, well-developed, in NAD Heart:  Regular rate and rhythm; no murmurs Chest: Clear to ascultation bilaterally Abdomen:  Soft, nontender and mildly distended. Normal bowel sounds, without guarding, and without rebound.   Extremities:  Without edema. Neurologic:  Alert and  oriented x4; grossly normal neurologically. Psych:  Alert and cooperative. Normal mood and affect.  Intake/Output from previous day: 10/01 0701 - 10/02 0700 In: 750 [I.V.:700; IV Piggyback:50] Out: 450 [Urine:450] Intake/Output this shift: No intake/output data recorded.  Lab Results:  Recent Labs  04/14/17 0033 04/14/17 0830 04/14/17 1617 04/15/17 0326  WBC 5.9  --  6.6 4.6  HGB 7.6* 7.6* 7.9* 7.4*  HCT 21.8* 22.0* 22.9* 21.2*  PLT 105*  --  120* 95*   BMET  Recent Labs  04/13/17 0531 04/14/17 0033 04/15/17 0326  NA 132* 131* 133*  K 5.4* 4.4 4.3  CL 97* 100* 103  CO2 29 25 25   GLUCOSE 161* 113* 90  BUN 16 19 15   CREATININE 0.92 0.85 0.76  CALCIUM 8.6* 7.8* 7.6*   LFT  Recent Labs  04/15/17 0326  PROT 5.0*  ALBUMIN 2.1*  AST 35  ALT 15*  ALKPHOS 64  BILITOT 1.0   PT/INR  Recent Labs  04/13/17 0944  LABPROT 14.4  INR 1.13      Assessment & Plan   1. Decompensated cirrhosis with ascites and an acute esophageal variceal bleed s/p banding. Hb stabilizing, now at 7.4. Advance diet today. Continue octreotide IV, PPI IV for 1 more day. Continue Rocephin.Trend CBC, CMP. Holding diuretics today.    2. ABL anemia. Trend CBC.   3. Small volume hematochezia, likely due to internal hemorrhoids, rectal varices previously documented. Monitor and add stool softener.    Pricilla Riffle. Fuller Plan MD 04/15/2017, 9:04 AM

## 2017-04-16 DIAGNOSIS — I8500 Esophageal varices without bleeding: Secondary | ICD-10-CM

## 2017-04-16 DIAGNOSIS — I10 Essential (primary) hypertension: Secondary | ICD-10-CM

## 2017-04-16 DIAGNOSIS — K219 Gastro-esophageal reflux disease without esophagitis: Secondary | ICD-10-CM

## 2017-04-16 LAB — COMPREHENSIVE METABOLIC PANEL
ALBUMIN: 2.1 g/dL — AB (ref 3.5–5.0)
ALT: 15 U/L — ABNORMAL LOW (ref 17–63)
AST: 38 U/L (ref 15–41)
Alkaline Phosphatase: 64 U/L (ref 38–126)
Anion gap: 5 (ref 5–15)
BUN: 11 mg/dL (ref 6–20)
CHLORIDE: 102 mmol/L (ref 101–111)
CO2: 25 mmol/L (ref 22–32)
CREATININE: 0.65 mg/dL (ref 0.61–1.24)
Calcium: 7.5 mg/dL — ABNORMAL LOW (ref 8.9–10.3)
GFR calc Af Amer: >60 mL/min (ref >60)
GLUCOSE: 97 mg/dL (ref 65–99)
POTASSIUM: 4.2 mmol/L (ref 3.5–5.1)
SODIUM: 132 mmol/L — AB (ref 135–145)
Total Bilirubin: 0.8 mg/dL (ref 0.3–1.2)
Total Protein: 5 g/dL — ABNORMAL LOW (ref 6.5–8.1)

## 2017-04-16 LAB — PREPARE RBC (CROSSMATCH)

## 2017-04-16 LAB — CBC
HCT: 20.6 % — ABNORMAL LOW (ref 39.0–52.0)
Hemoglobin: 7 g/dL — ABNORMAL LOW (ref 13.0–17.0)
MCH: 32.7 pg (ref 26.0–34.0)
MCHC: 34 g/dL (ref 30.0–36.0)
MCV: 96.3 fL (ref 78.0–100.0)
PLATELETS: 91 10*3/uL — AB (ref 150–400)
RBC: 2.14 MIL/uL — ABNORMAL LOW (ref 4.22–5.81)
RDW: 15.3 % (ref 11.5–15.5)
WBC: 4.2 10*3/uL (ref 4.0–10.5)

## 2017-04-16 MED ORDER — FUROSEMIDE 10 MG/ML IJ SOLN
20.0000 mg | Freq: Once | INTRAMUSCULAR | Status: AC
  Administered 2017-04-16: 20 mg via INTRAVENOUS
  Filled 2017-04-16: qty 2

## 2017-04-16 MED ORDER — CIPROFLOXACIN HCL 500 MG PO TABS: 500.0000 mg | ORAL_TABLET | Freq: Two times a day (BID) | ORAL | 0 refills | Status: DC

## 2017-04-16 MED ORDER — SODIUM CHLORIDE 0.9 % IV SOLN: Freq: Once | INTRAVENOUS | Status: DC

## 2017-04-16 MED ORDER — OMEPRAZOLE 40 MG PO CPDR: 40.0000 mg | DELAYED_RELEASE_CAPSULE | Freq: Two times a day (BID) | ORAL | 3 refills | Status: DC

## 2017-04-16 NOTE — Progress Notes (Addendum)
     [facility] Progress Note  Chief Complaint:    GI bleeding / cirrhosis  Subjective: feels okay. Appetite is good. No bleeding  Objective:  Vital signs in last 24 hours: Temp:  [98 F (36.7 C)-99.1 F (37.3 C)] 98 F (36.7 C) (10/03 1014) Pulse Rate:  [72-79] 77 (10/03 1014) Resp:  [14-21] 16 (10/03 1014) BP: (97-127)/(43-54) 104/46 (10/03 1014) SpO2:  [94 %-98 %] 98 % (10/03 1014) Weight:  [183 lb 10.3 oz (83.3 kg)] 183 lb 10.3 oz (83.3 kg) (10/03 0400) Last BM Date: 04/13/17 General:   Alert, well-developed, white male  in NAD EENT:  Normal hearing, non icteric sclera, conjunctive pink.  Heart:  Regular rate and rhythm; no murmurs. No lower extremity edema Pulm: Normal respiratory effort Abdomen:  Soft, nondistended, nontender.  Normal bowel sounds, no masses felt. No hepatomegaly.    Neurologic:  Alert and  oriented x4;  grossly normal neurologically. Psych:  Pleasant, cooperative.  Normal mood and affect.   Intake/Output from previous day: 10/02 0701 - 10/03 0700 In: 1700 [I.V.:1700] Out: 925 [Urine:925] Intake/Output this shift: Total I/O In: 50 [I.V.:50] Out: 200 [Urine:200]  Lab Results:  Recent Labs  04/14/17 1617 04/15/17 0326 04/16/17 0331  WBC 6.6 4.6 4.2  HGB 7.9* 7.4* 7.0*  HCT 22.9* 21.2* 20.6*  PLT 120* 95* 91*   BMET  Recent Labs  04/14/17 0033 04/15/17 0326 04/16/17 0331  NA 131* 133* 132*  K 4.4 4.3 4.2  CL 100* 103 102  CO2 25 25 25   GLUCOSE 113* 90 97  BUN 19 15 11   CREATININE 0.85 0.76 0.65  CALCIUM 7.8* 7.6* 7.5*   LFT  Recent Labs  04/16/17 0331  PROT 5.0*  ALBUMIN 2.1*  AST 38  ALT 15*  ALKPHOS 64  BILITOT 0.8    ASSESSMENT / PLAN:   1. Decompensated etoh cirrhosis / variceal bleed, s/p banding. Resolved.  -will d/c octreotide.  -continue BID PPI -follow appt made. He will need follow up EGD several weeks from now.  -Marie screening. AFP normal. Will arrange for imaging at office appt.    -continue antibiotics for SBP prophylaxis for total of one week.  -Restart home diuretics at discharge -advance to 2 gram sodium diet now.   2. Anemia of ABL. Received 2 units of blood. No further bleeding but hgb has drifted down to 7 and primary team has ordered additional 2 units of blood. Following that he is stable for discharge from GI standpoint.    Discharge Planning Diet: 2 gram Anticoagulation and antiplatelets: N/A Discharge Medications: Continue home diuretics Follow up: Oct 24 at 10:00am  Do not lift anything over 10 pounds until seen in office. No heavy exertion.   Principal Problem:   Secondary esophageal varices with bleeding (HCC) Active Problems:   Essential hypertension, benign   Alcoholic cirrhosis of liver (HCC)   GI bleed   Acute GI bleeding   Acute blood loss anemia    LOS: 3 days   Tye Savoy ,NP 04/16/2017, 10:23 AM  Pager number [phone]    Attending physician's note   I have taken an interval history, reviewed the chart and examined the patient. I agree with the Advanced Practitioner's note, impression and recommendations. OK for discharge today from GI standpoint after transfusion. GI follow up, medications, diet and restrictions as outlined above.   Lucio Edward, MD [facility] [phone] Mon-Fri 8a-5p [phone] after 5p, weekends, holidays

## 2017-04-16 NOTE — Discharge Summary (Signed)
Physician Discharge Summary  Billy [name]:[id number] DOB: 17-Mar-1946 DOA: 04/13/2017  PCP: Billy Carbon, MD  Admit date: 04/13/2017 Discharge date: 04/16/2017  Time spent: 35 minutes  Recommendations for Outpatient Follow-up:  1. Repeat CBC to follow Hgb trend  2. Repeat BMET to follow electrolytes and renal function  3. Patient to follow up with GI as an outpatient for further evaluation and treatment of his cirrhosis and for them to repeat EGD.   Discharge Diagnoses:   Acute GI bleeding   Acute blood loss anemia   Secondary esophageal varices with bleeding (HCC)   Essential hypertension, benign   Alcoholic cirrhosis of liver ([place])    Discharge Condition: stable and improved. Patient discharge home with instructions to follow up with PCP In 10 days and to follow up with GI as instructed.  Diet recommendation: low sodium diet   Filed Weights   04/14/17 0500 04/15/17 0420 04/16/17 0400  Weight: 80.2 kg (176 lb 12.9 oz) 81.7 kg (180 lb 1.9 oz) 83.3 kg (183 lb 10.3 oz)    History of present illness:  As per H&P written by Dr. Derrek Gu on 04/13/17 71 year old [name] past medical history of alcoholic cirrhosis with a known history of varices and one prior episode of GI bleed remotely; who presented to the emergency department with complaints of throwing up blood and passing bloody stools. In the emergency department he was found to be hemodynamically stable however he was noted to have a decrease in his hemoglobin of approx 4 g from baseline. Gastroenterology was consulted and patient was admitted to the medical ICU for close monitoring until gastroenterology is able to perform EGD. Patient was actively still having bloody bowel movements however he has stopped having hematemesis.   Hospital Course:  1-upper GIB: secondary to esophageal varices -s/p EGD with esophageal banding on 9/30 -was on octreotide drip and Protonix drip -no further signs of bleeding  -patient received 4  units of PRBC's and has remained hemodynamically stable. -per GI ok to discharge home with outpatient follow up. -will discharge on PPI BID -instructed to follow low sodium diet and to resume the rest of his medications for cirrhosis  2-acute blood loss anemia -secondary to GIB as mentioned above -received a total of 4 units of PRBC's -please repeat CBC at follow up to assess Hgb trend   3-HTN -stable and well controlled -resume home medications at discharge  4-ETOH cirrhosis -stable and w/o signs of ascites -per GI recommendations will do empirically prophylaxis with antibiotics for 5 more days at discharge -no fever and no signs of active infection.  5-hyponatremia -associated with chronic hx of cirrhosis  -stable and asymptomatic -will recommend BMET at follow up to check electrolytes; especially since his diuretics regimen will be resumed.   Procedures:   EGD 04/13/17: with banding of esophageal varices.  Consultations:  Gastroenterology   Discharge Exam: Vitals:   04/16/17 1330 04/16/17 1500  BP: (!) 161/71 (!) 119/56  Pulse:  71  Resp: 17 19  Temp: 97.6 F (36.4 C) 98.1 F (36.7 C)  SpO2:     General exam: Appears calm and comfortable. No CP, no SOB and no further signs of active bleeding. Respiratory system: Clear to auscultation. Respiratory effort normal. Cardiovascular system: S1 & S2 heard, RRR, no rubs, no gallops.  Gastrointestinal system: Abdomen is nondistended, soft and nontender. No organomegaly or masses felt. Normal bowel sounds heard. Central nervous system: Alert and oriented. No focal neurological deficits. Extremities: Symmetric 5 x 5  power. Skin: No rashes, lesions  Psychiatry: Judgement and insight appear normal. Mood & affect appropriate.    Discharge Instructions   Discharge Instructions    Discharge instructions    Complete by:  As directed    Follow up with gastroenterology service (Oct 24 at 10:00am)  Do not lift anything over  10 pounds until seen in office. No heavy exertion. Maintain adequate hydration  Take medications as prescribed Follow low sodium diet (2 gram max daily)   Increase activity slowly    Complete by:  As directed      Current Discharge Medication List    START taking these medications   Details  ciprofloxacin (CIPRO) 500 MG tablet Take 1 tablet (500 mg total) by mouth 2 (two) times daily. Qty: 10 tablet, Refills: 0      CONTINUE these medications which have CHANGED   Details  omeprazole (PRILOSEC) 40 MG capsule Take 1 capsule (40 mg total) by mouth 2 (two) times daily. Qty: 60 capsule, Refills: 3      CONTINUE these medications which have NOT CHANGED   Details  furosemide (LASIX) 40 MG tablet TAKE 1 TABLET (40 MG TOTAL) BY MOUTH DAILY. Qty: 90 tablet, Refills: 1   Associated Diagnoses: Ascites    HYDROcodone-acetaminophen (NORCO/VICODIN) 5-325 MG tablet TAKE 1 TABLET BY MOUTH 3 TIMES A DAY AS NEEDED Qty: 90 tablet, Refills: 0    mirtazapine (REMERON) 45 MG tablet TAKE 1 TABLET (45 MG TOTAL) BY MOUTH AT BEDTIME. Qty: 90 tablet, Refills: 1    Multiple Vitamin (MULTIVITAMIN WITH MINERALS) TABS Take 1 tablet by mouth daily.    nadolol (CORGARD) 40 MG tablet TAKE 1 TABLET (40 MG TOTAL) BY MOUTH DAILY. Qty: 90 tablet, Refills: 3    PREVIDENT 5000 DRY MOUTH 1.1 % GEL dental gel Place 1 application onto teeth at bedtime.     psyllium (METAMUCIL) 58.6 % powder Take 1 packet by mouth daily.     spironolactone (ALDACTONE) 50 MG tablet Take 2 tablets (100 mg total) by mouth daily. Qty: 180 tablet, Refills: 1    vitamin C (ASCORBIC ACID) 500 MG tablet Take 500 mg by mouth daily.       Allergies  Allergen Reactions  . Bee Venom     Yellow Jackets   . Nsaids Other (See Comments)    Stomach pain Bleeding risk  . Other     Increased bleeding risk   Follow-up Information    Willia Craze, NP Follow up on 05/07/2017.   Specialty:  Gastroenterology Why:  at 10:00 am.  Please arrive 15 minutes early Contact information: [place] [address] [phone]        Billy Carbon, MD. Schedule an appointment as soon as possible for a visit in 10 day(s).   Specialties:  Internal Medicine, Pediatrics Contact information: [place] [address] [phone]           The results of significant diagnostics from this hospitalization (including imaging, microbiology, ancillary and laboratory) are listed below for reference.    Significant Diagnostic Studies: Dg Chest Port 1 View  Result Date: 04/13/2017 CLINICAL DATA:  Pt c/o hemoptysis and blood in stool with two episodes of each since last night. Hx of cirrhosis and esophageal varices. EXAM: PORTABLE CHEST 1 VIEW COMPARISON:  09/02/2013 FINDINGS: The heart size and mediastinal contours are within normal limits. Both lungs are clear. The visualized skeletal structures are unremarkable. IMPRESSION: No active disease. Electronically  Signed   By: Nolon Nations M.D.   On: 04/13/2017 09:26    Microbiology: Recent Results (from the past 240 hour(s))  MRSA PCR Screening     Status: None   Collection Time: 04/13/17  4:00 PM  Result Value Ref Range Status   MRSA by PCR NEGATIVE NEGATIVE Final    Comment:        The GeneXpert MRSA Assay (FDA approved for NASAL specimens only), is one component of a comprehensive MRSA colonization surveillance program. It is not intended to diagnose MRSA infection nor to guide or monitor treatment for MRSA infections.     Labs: Basic Metabolic Panel:  Recent Labs Lab 04/13/17 0531 04/14/17 0033 04/15/17 0326 04/16/17 0331  NA 132* 131* 133* 132*  K 5.4* 4.4 4.3 4.2  CL 97* 100* 103 102  CO2 29 25 25 25   GLUCOSE 161* 113* 90 97  BUN 16 19 15 11   CREATININE 0.92 0.85 0.76 0.65  CALCIUM 8.6* 7.8* 7.6* 7.5*  MG  --  1.5* 2.0  --   PHOS  --  3.3  --   --    Liver Function Tests:  Recent Labs Lab 04/13/17 0531  04/15/17 0326 04/16/17 0331  AST 46* 35 38  ALT 21 15* 15*  ALKPHOS 98 64 64  BILITOT 1.7* 1.0 0.8  PROT 7.0 5.0* 5.0*  ALBUMIN 2.9* 2.1* 2.1*    Recent Labs Lab 04/13/17 0531  LIPASE 36    Recent Labs Lab 04/13/17 0944  AMMONIA 32   CBC:  Recent Labs Lab 04/13/17 0531  04/14/17 0033 04/14/17 0830 04/14/17 1617 04/15/17 0326 04/16/17 0331  WBC 11.3*  --  5.9  --  6.6 4.6 4.2  HGB 10.5*  < > 7.6* 7.6* 7.9* 7.4* 7.0*  HCT 30.4*  < > 21.8* 22.0* 22.9* 21.2* 20.6*  MCV 95.0  --  93.2  --  94.2 94.6 96.3  PLT 210  --  105*  --  120* 95* 91*  < > = values in this interval not displayed.   Signed:  Barton Dubois MD.  [facility] 04/16/2017, 5:24 PM

## 2017-04-17 LAB — TYPE AND SCREEN
ABO/RH(D): O POS
ANTIBODY SCREEN: NEGATIVE
Unit division: 0
Unit division: 0

## 2017-04-17 LAB — BPAM RBC
Blood Product Expiration Date: 201810192359
Blood Product Expiration Date: 201810192359
ISSUE DATE / TIME: 201810031016
ISSUE DATE / TIME: 201810031303
Unit Type and Rh: 5100
Unit Type and Rh: 5100

## 2017-04-18 ENCOUNTER — Ambulatory Visit (INDEPENDENT_AMBULATORY_CARE_PROVIDER_SITE_OTHER): Payer: 59 | Admitting: Internal Medicine

## 2017-04-18 ENCOUNTER — Other Ambulatory Visit: Payer: Self-pay

## 2017-04-18 ENCOUNTER — Encounter: Payer: Self-pay | Admitting: Internal Medicine

## 2017-04-18 VITALS — BP 124/70 | HR 80 | Temp 97.7°F | Resp 12 | Wt 178.2 lb

## 2017-04-18 DIAGNOSIS — I8511 Secondary esophageal varices with bleeding: Secondary | ICD-10-CM

## 2017-04-18 DIAGNOSIS — F1014 Alcohol abuse with alcohol-induced mood disorder: Secondary | ICD-10-CM | POA: Diagnosis not present

## 2017-04-18 DIAGNOSIS — I8500 Esophageal varices without bleeding: Secondary | ICD-10-CM

## 2017-04-18 DIAGNOSIS — K7031 Alcoholic cirrhosis of liver with ascites: Secondary | ICD-10-CM | POA: Diagnosis not present

## 2017-04-18 LAB — RENAL FUNCTION PANEL
Albumin: 2.9 g/dL — ABNORMAL LOW (ref 3.5–5.2)
BUN: 9 mg/dL (ref 6–23)
CALCIUM: 8.8 mg/dL (ref 8.4–10.5)
CO2: 32 meq/L (ref 19–32)
Chloride: 99 mEq/L (ref 96–112)
Creatinine, Ser: 0.78 mg/dL (ref 0.40–1.50)
GFR: 104.87 mL/min (ref >60.00)
Glucose, Bld: 114 mg/dL — ABNORMAL HIGH (ref 70–99)
POTASSIUM: 4.9 meq/L (ref 3.5–5.1)
Phosphorus: 2.8 mg/dL (ref 2.3–4.6)
Sodium: 136 mEq/L (ref 135–145)

## 2017-04-18 LAB — CBC WITH DIFFERENTIAL/PLATELET
BASOS ABS: 0 10*3/uL (ref 0.0–0.1)
Basophils Relative: 0.6 % (ref 0.0–3.0)
Eosinophils Absolute: 0.1 10*3/uL (ref 0.0–0.7)
Eosinophils Relative: 1.9 % (ref 0.0–5.0)
HEMATOCRIT: 31.8 % — AB (ref 39.0–52.0)
Hemoglobin: 10.6 g/dL — ABNORMAL LOW (ref 13.0–17.0)
LYMPHS PCT: 13 % (ref 12.0–46.0)
Lymphs Abs: 0.9 10*3/uL (ref 0.7–4.0)
MCHC: 33.4 g/dL (ref 30.0–36.0)
MCV: 99.8 fl (ref 78.0–100.0)
MONOS PCT: 10.8 % (ref 3.0–12.0)
Monocytes Absolute: 0.8 10*3/uL (ref 0.1–1.0)
NEUTROS ABS: 5.2 10*3/uL (ref 1.4–7.7)
Neutrophils Relative %: 73.7 % (ref 43.0–77.0)
Platelets: 141 10*3/uL — ABNORMAL LOW (ref 150.0–400.0)
RBC: 3.18 Mil/uL — AB (ref 4.22–5.81)
RDW: 15.2 % (ref 11.5–15.5)
WBC: 7.1 10*3/uL (ref 4.0–10.5)

## 2017-04-18 MED ORDER — BUPROPION HCL ER (XL) 150 MG PO TB24: 150.0000 mg | ORAL_TABLET | Freq: Every day | ORAL | 5 refills | Status: DC

## 2017-04-18 NOTE — Progress Notes (Signed)
Subjective:    Patient ID: Billy [name], male    DOB: 1945/12/04, 71 y.o.   MRN: [id number]  HPI Here for hospital follow up  He admits to some alcohol---every other day Liquor with water and only 2-3  Realizes that he can't drink at all now  Looked for his AA sponsor---he relapsed and died  Feeling stronger every day Stool normal today--light brown No nausea or vomiting  Didn't think he was depressed Maybe he is on reflection though Doesn't think retirement was good for him  On cipro still ?SBP prophylaxis?  Current Outpatient Prescriptions on File Prior to Visit  Medication Sig Dispense Refill  . ciprofloxacin (CIPRO) 500 MG tablet Take 1 tablet (500 mg total) by mouth 2 (two) times daily. 10 tablet 0  . furosemide (LASIX) 40 MG tablet TAKE 1 TABLET (40 MG TOTAL) BY MOUTH DAILY. 90 tablet 1  . HYDROcodone-acetaminophen (NORCO/VICODIN) 5-325 MG tablet TAKE 1 TABLET BY MOUTH 3 TIMES A DAY AS NEEDED 90 tablet 0  . mirtazapine (REMERON) 45 MG tablet TAKE 1 TABLET (45 MG TOTAL) BY MOUTH AT BEDTIME. 90 tablet 1  . Multiple Vitamin (MULTIVITAMIN WITH MINERALS) TABS Take 1 tablet by mouth daily.    . nadolol (CORGARD) 40 MG tablet TAKE 1 TABLET (40 MG TOTAL) BY MOUTH DAILY. 90 tablet 3  . omeprazole (PRILOSEC) 40 MG capsule Take 1 capsule (40 mg total) by mouth 2 (two) times daily. 60 capsule 3  . PREVIDENT 5000 DRY MOUTH 1.1 % GEL dental gel Place 1 application onto teeth at bedtime.     . psyllium (METAMUCIL) 58.6 % powder Take 1 packet by mouth daily.     Marland Kitchen spironolactone (ALDACTONE) 50 MG tablet Take 2 tablets (100 mg total) by mouth daily. 180 tablet 1  . vitamin C (ASCORBIC ACID) 500 MG tablet Take 500 mg by mouth daily.     No current facility-administered medications on file prior to visit.     Allergies  Allergen Reactions  . Bee Venom     Yellow Jackets   . Nsaids Other (See Comments)    Stomach pain Bleeding risk  . Other     Increased bleeding risk     Past Medical History:  Diagnosis Date  . Acute posthemorrhagic anemia   . Alcoholic cirrhosis of liver ([place]) 09/24/2011   Years of alcohol use. Progressed from fatty liver to cirrhosis based upon review imaging. Manifestations are portal hypertension with esophageal varices, portal gastropathy, rectal varices and he may have portal colopathy. Ascites also present. Naive to HAV and HBV Claims he has had pneumococcal vaccine   . Alcoholism ([place])   . Angiodysplasia of colon 09/11/2011   Multiple in the right colon, ablated with APC. Question if this is portal colopathy.   . Back pain   . Basal cell carcinoma   . Bilateral varicoceles   . Campylobacter diarrhea 01/07/2012  . DDD (degenerative disc disease)   . Depression    mild at most  . Diverticulosis of colon   . Erectile dysfunction   . Gastric ulcer 08/2011   small, antral ulcer  . GERD (gastroesophageal reflux disease)    takes Omeprazole prn  . GI hemorrhage 2006, 2013   esophagitis and 1+ varices, melena in 2013   . History of blood transfusion    as a child and no abnormal reaction noted  . History of bronchitis    >43yrs ago  . Hydrocele   . Hypertension  takes Nadolol daily  . Personal history of colonic polyps   . 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 Scrotal edema   . Sleep disturbance    takes Remeron nightly  . Thrombocytopenia, unspecified ([place])   . Varices, esophageal ([place]) 08/2011   Grade 2, mid-esophagus    Past Surgical History:  Procedure Laterality Date  . CATARACT EXTRACTION  2013   bilateral with IOL  . CHOLECYSTECTOMY  12/31/2012   Procedure: LAPAROSCOPIC CHOLECYSTECTOMY;  Surgeon: Zenovia Jarred, MD;  Location: [facility];  Service: General;;  . COLONOSCOPY  2001, 200411/02/2006  . COLONOSCOPY  09/11/2011   Procedure: COLONOSCOPY;  Surgeon: Gatha Mayer, MD;  Location: [facility];  Service: Endoscopy;  Laterality: N/A;  . ESOPHAGOGASTRODUODENOSCOPY  2006  .  ESOPHAGOGASTRODUODENOSCOPY  09/11/2011   Procedure: ESOPHAGOGASTRODUODENOSCOPY (EGD);  Surgeon: Gatha Mayer, MD;  Location: [facility];  Service: Endoscopy;  Laterality: N/A;  . ESOPHAGOGASTRODUODENOSCOPY (EGD) WITH PROPOFOL N/A 04/13/2017   Procedure: ESOPHAGOGASTRODUODENOSCOPY (EGD) WITH PROPOFOL;  Surgeon: Doran Stabler, MD;  Location: [facility];  Service: Gastroenterology;  Laterality: N/A;  . EYE SURGERY    . FLEXIBLE SIGMOIDOSCOPY  01/07/2012   Procedure: FLEXIBLE SIGMOIDOSCOPY;  Surgeon: Lafayette Dragon, MD;  Location: [facility];  Service: Endoscopy;  Laterality: N/A;  . KNEE ARTHROSCOPY  2008   Right  . KNEE ARTHROSCOPY  2008   Left  . [place] THORA/PARACENTESIS  10/28/2011  . Varicocele repair  54's    Family History  Problem Relation Age of Onset  . Stroke Mother   . Diabetes Mother   . Liver cancer Cousin   . Liver cancer Paternal Uncle   . Colon cancer Neg Hx   . Stomach cancer Neg Hx   . Esophageal cancer Neg Hx     Social History   Social History  . Marital status: Married    Spouse name: N/A  . Number of children: 1  . Years of education: N/A   Occupational History  . Retired     mostly from [employer]   Social History Main Topics  . Smoking status: Current Some Day Smoker    Packs/day: 0.50    Types: Cigarettes  . Smokeless tobacco: Never Used     Comment: current using E cigarette, also  . Alcohol use No  . Drug use: No  . Sexual activity: No   Other Topics Concern  . Not on file   Social History Narrative   Married- 2nd   1 son (adopted) from previous marriage      Has living will   Wife is  health care POA   Would accept attempts at resuscitation   No tube feeds if cognitively unaware         Review of Systems  Appetite is slowly recovering Sleeping okay with mirtazapine No SOB but DOE is significant (but improving) Hydrocele worse    Objective:   Physical Exam  Constitutional: No distress.  Neck: No thyromegaly present.    Cardiovascular: Normal rate, regular rhythm and normal heart sounds.  Exam reveals no gallop.   No murmur heard. Pulmonary/Chest: Effort normal and breath sounds normal. No respiratory distress. He has no wheezes. He has no rales.  Abdominal: There is no tenderness. There is no rebound.  Ascites present but not tender  Lymphadenopathy:    He has no cervical adenopathy.  Psychiatric:  Mildly depressed          Assessment & Plan:

## 2017-04-18 NOTE — Assessment & Plan Note (Signed)
Severe anemia Feeling better Will repeat CBC

## 2017-04-18 NOTE — Assessment & Plan Note (Signed)
No peritonitis--will stop the cipro Will see GI about repeat EGD, etc On spironolactone---will repeat labs

## 2017-04-18 NOTE — Progress Notes (Signed)
Thanks [name]  He is in for f/u with Tye Savoy at our [facility] and also needs me to do an EGD  I am going to change to an EGD w/ me at [facility] to f/u varicesand will cancel NP appt - will be around same time late Oct  Will address any ongoing liver tx then to   [facility] - please cancel [facility] appt and set up for EGD w/ me hospital week or could do 10/19 if have a remaining spot  Billy [name]

## 2017-04-18 NOTE — Patient Instructions (Signed)
Please go back to AA today!

## 2017-04-18 NOTE — Progress Notes (Signed)
Patient notified of the plan  He is scheduled for 05/02/17 11:45 at [facility].  He is notified I will mail instructions and he will call with any questions.

## 2017-04-18 NOTE — Assessment & Plan Note (Signed)
Discussed going back to AA and new sponsor Will start bupropion low dose to help mood and abstinence

## 2017-04-22 ENCOUNTER — Ambulatory Visit: Payer: 59 | Admitting: Internal Medicine

## 2017-04-23 ENCOUNTER — Encounter (HOSPITAL_COMMUNITY): Payer: Self-pay

## 2017-04-23 ENCOUNTER — Ambulatory Visit: Payer: 59 | Admitting: Internal Medicine

## 2017-04-28 ENCOUNTER — Encounter: Payer: Self-pay | Admitting: Internal Medicine

## 2017-04-28 MED ORDER — BUPROPION HCL ER (SR) 150 MG PO TB12: 150.0000 mg | ORAL_TABLET | Freq: Every day | ORAL | 3 refills | Status: DC

## 2017-05-02 ENCOUNTER — Ambulatory Visit (HOSPITAL_COMMUNITY)
Admission: RE | Admit: 2017-05-02 | Discharge: 2017-05-02 | Disposition: A | Discharge status: Home / Self Care | Payer: 59 | Source: Ambulatory Visit | Attending: Internal Medicine | Admitting: Internal Medicine

## 2017-05-02 ENCOUNTER — Encounter (HOSPITAL_COMMUNITY)
Admission: RE | Disposition: A | Discharge status: Home / Self Care | Payer: Self-pay | Source: Ambulatory Visit | Attending: Internal Medicine

## 2017-05-02 ENCOUNTER — Encounter (HOSPITAL_COMMUNITY): Payer: Self-pay | Admitting: Certified Registered"

## 2017-05-02 ENCOUNTER — Ambulatory Visit (HOSPITAL_COMMUNITY): Payer: 59 | Admitting: Anesthesiology

## 2017-05-02 DIAGNOSIS — F1021 Alcohol dependence, in remission: Secondary | ICD-10-CM | POA: Diagnosis not present

## 2017-05-02 DIAGNOSIS — I8500 Esophageal varices without bleeding: Secondary | ICD-10-CM | POA: Diagnosis present

## 2017-05-02 DIAGNOSIS — K766 Portal hypertension: Secondary | ICD-10-CM | POA: Insufficient documentation

## 2017-05-02 DIAGNOSIS — Z85828 Personal history of other malignant neoplasm of skin: Secondary | ICD-10-CM | POA: Diagnosis not present

## 2017-05-02 DIAGNOSIS — F17290 Nicotine dependence, other tobacco product, uncomplicated: Secondary | ICD-10-CM | POA: Insufficient documentation

## 2017-05-02 DIAGNOSIS — G479 Sleep disorder, unspecified: Secondary | ICD-10-CM | POA: Insufficient documentation

## 2017-05-02 DIAGNOSIS — Z888 Allergy status to other drugs, medicaments and biological substances status: Secondary | ICD-10-CM | POA: Diagnosis not present

## 2017-05-02 DIAGNOSIS — I8511 Secondary esophageal varices with bleeding: Secondary | ICD-10-CM | POA: Diagnosis not present

## 2017-05-02 DIAGNOSIS — K219 Gastro-esophageal reflux disease without esophagitis: Secondary | ICD-10-CM | POA: Diagnosis not present

## 2017-05-02 DIAGNOSIS — K2210 Ulcer of esophagus without bleeding: Secondary | ICD-10-CM | POA: Diagnosis not present

## 2017-05-02 DIAGNOSIS — K7030 Alcoholic cirrhosis of liver without ascites: Secondary | ICD-10-CM | POA: Insufficient documentation

## 2017-05-02 DIAGNOSIS — F17210 Nicotine dependence, cigarettes, uncomplicated: Secondary | ICD-10-CM | POA: Diagnosis not present

## 2017-05-02 DIAGNOSIS — K3189 Other diseases of stomach and duodenum: Secondary | ICD-10-CM | POA: Insufficient documentation

## 2017-05-02 DIAGNOSIS — F329 Major depressive disorder, single episode, unspecified: Secondary | ICD-10-CM | POA: Insufficient documentation

## 2017-05-02 DIAGNOSIS — I10 Essential (primary) hypertension: Secondary | ICD-10-CM | POA: Insufficient documentation

## 2017-05-02 HISTORY — PX: ESOPHAGOGASTRODUODENOSCOPY (EGD) WITH PROPOFOL: SHX5813

## 2017-05-02 HISTORY — PX: ESOPHAGEAL BANDING: SHX5518

## 2017-05-02 SURGERY — ESOPHAGOGASTRODUODENOSCOPY (EGD) WITH PROPOFOL
Anesthesia: General

## 2017-05-02 MED ORDER — PROPOFOL 10 MG/ML IV BOLUS
INTRAVENOUS | Status: DC | PRN
  Administered 2017-05-02: 140 mg via INTRAVENOUS

## 2017-05-02 MED ORDER — LIDOCAINE 2% (20 MG/ML) 5 ML SYRINGE
INTRAMUSCULAR | Status: DC | PRN
  Administered 2017-05-02: 60 mg via INTRAVENOUS

## 2017-05-02 MED ORDER — DEXAMETHASONE SODIUM PHOSPHATE 10 MG/ML IJ SOLN
INTRAMUSCULAR | Status: AC
  Filled 2017-05-02: qty 1

## 2017-05-02 MED ORDER — ONDANSETRON HCL 4 MG/2ML IJ SOLN
INTRAMUSCULAR | Status: DC | PRN
  Administered 2017-05-02: 4 mg via INTRAVENOUS

## 2017-05-02 MED ORDER — PROPOFOL 10 MG/ML IV BOLUS
INTRAVENOUS | Status: AC
  Filled 2017-05-02: qty 20

## 2017-05-02 MED ORDER — ONDANSETRON HCL 4 MG/2ML IJ SOLN
INTRAMUSCULAR | Status: AC
  Filled 2017-05-02: qty 2

## 2017-05-02 MED ORDER — LACTATED RINGERS IV SOLN
INTRAVENOUS | Status: DC
  Administered 2017-05-02 (×2): via INTRAVENOUS

## 2017-05-02 MED ORDER — SUCCINYLCHOLINE CHLORIDE 200 MG/10ML IV SOSY
PREFILLED_SYRINGE | INTRAVENOUS | Status: AC
  Filled 2017-05-02: qty 10

## 2017-05-02 MED ORDER — HYDROCODONE-ACETAMINOPHEN 5-325 MG PO TABS: 1.0000 | ORAL_TABLET | Freq: Three times a day (TID) | ORAL | Status: DC | PRN

## 2017-05-02 MED ORDER — SUCCINYLCHOLINE CHLORIDE 200 MG/10ML IV SOSY
PREFILLED_SYRINGE | INTRAVENOUS | Status: DC | PRN
  Administered 2017-05-02: 100 mg via INTRAVENOUS

## 2017-05-02 MED ORDER — LIDOCAINE 2% (20 MG/ML) 5 ML SYRINGE
INTRAMUSCULAR | Status: AC
  Filled 2017-05-02: qty 20

## 2017-05-02 MED ORDER — PHENYLEPHRINE 40 MCG/ML (10ML) SYRINGE FOR IV PUSH (FOR BLOOD PRESSURE SUPPORT)
PREFILLED_SYRINGE | INTRAVENOUS | Status: AC
  Filled 2017-05-02: qty 10

## 2017-05-02 SURGICAL SUPPLY — 15 items

## 2017-05-02 NOTE — Discharge Instructions (Signed)
° °  I did not need to band anything today.  Need to check again in about 3 months.  Need to see you in the office in December - call and make an appointment please.  I appreciate the opportunity to care for you. Gatha Mayer, MD, FACG  YOU HAD AN ENDOSCOPIC PROCEDURE TODAY: Refer to the procedure report and other information in the discharge instructions given to you for any specific questions about what was found during the examination. If this information does not answer your questions, please call Dr. Celesta Aver office at [phone] to clarify.   YOU SHOULD EXPECT: Some feelings of bloating in the abdomen. Passage of more gas than usual. Walking can help get rid of the air that was put into your GI tract during the procedure and reduce the bloating. If you had a lower endoscopy (such as a colonoscopy or flexible sigmoidoscopy) you may notice spotting of blood in your stool or on the toilet paper. Some abdominal soreness may be present for a day or two, also.  DIET: Your first meal following the procedure should be a light meal and then it is ok to progress to your normal diet. A half-sandwich or bowl of soup is an example of a good first meal. Heavy or fried foods are harder to digest and may make you feel nauseous or bloated. Drink plenty of fluids but you should avoid alcoholic beverages for 24 hours.   ACTIVITY: Your care partner should take you home directly after the procedure. You should plan to take it easy, moving slowly for the rest of the day. You can resume normal activity the day after the procedure however YOU SHOULD NOT DRIVE, use power tools, machinery or perform tasks that involve climbing or major physical exertion for 24 hours (because of the sedation medicines used during the test).   SYMPTOMS TO REPORT IMMEDIATELY: A gastroenterologist can be reached at any hour. Please call [phone]  for any of the following symptoms:  Following lower endoscopy (colonoscopy,  flexible sigmoidoscopy) Excessive amounts of blood in the stool  Significant tenderness, worsening of abdominal pains  Swelling of the abdomen that is new, acute  Fever of 100 or higher  Following upper endoscopy (EGD, EUS, ERCP, esophageal dilation) Vomiting of blood or coffee ground material  New, significant abdominal pain  New, significant chest pain or pain under the shoulder blades  Painful or persistently difficult swallowing  New shortness of breath  Black, tarry-looking or red, bloody stools  FOLLOW UP:  If any biopsies were taken you will be contacted by phone or by letter within the next 1-3 weeks. Call [phone]  if you have not heard about the biopsies in 3 weeks.  Please also call with any specific questions about appointments or follow up tests.

## 2017-05-02 NOTE — Anesthesia Procedure Notes (Signed)
Procedure Name: Intubation Date/Time: 05/02/2017 10:59 AM Performed by: Cynda Familia Pre-anesthesia Checklist: Patient identified, Emergency Drugs available, Suction available and Patient being monitored Patient Re-evaluated:Patient Re-evaluated prior to induction Oxygen Delivery Method: Circle System Utilized Preoxygenation: Pre-oxygenation with 100% oxygen Induction Type: IV induction Ventilation: Mask ventilation without difficulty Laryngoscope Size: Miller and 2 Grade View: Grade I Tube type: Oral Number of attempts: 1 Airway Equipment and Method: Stylet Placement Confirmation: ETT inserted through vocal cords under direct vision,  positive ETCO2 and breath sounds checked- equal and bilateral Secured at: 22 cm Tube secured with: Tape Dental Injury: Teeth and Oropharynx as per pre-operative assessment  Comments: Smooth IV induction Rose--- intubation AM CRNA atraumatic--- teeth and mouth as preop-- many chipped teeth esp bottom--- not reported preop--- bilat BS Rose

## 2017-05-02 NOTE — H&P (Signed)
[facility] History and Physical   Primary Care Physician:  [name], MD   Reason for Procedure:   F/U Varices  Plan:    EGD, possible variceal banding     HPI: Billy [name] is a 71 y.o. male w/ EtOH cirrhosis + ascites here after admission for variceal bleeding and s/p banding of varices. To see status of varices and possibly band again.   Past Medical History:  Diagnosis Date  . Acute posthemorrhagic anemia   . Alcoholic cirrhosis of liver ([place]) 09/24/2011   Years of alcohol use. Progressed from fatty liver to cirrhosis based upon review imaging. Manifestations are portal hypertension with esophageal varices, portal gastropathy, rectal varices and he may have portal colopathy. Ascites also present. Naive to HAV and HBV Claims he has had pneumococcal vaccine   . Alcoholism ([place])   . Angiodysplasia of colon 09/11/2011   Multiple in the right colon, ablated with APC. Question if this is portal colopathy.   . Back pain   . Basal cell carcinoma   . Bilateral varicoceles   . Campylobacter diarrhea 01/07/2012  . DDD (degenerative disc disease)   . Depression    mild at most  . Diverticulosis of colon   . Erectile dysfunction   . Gastric ulcer 08/2011   small, antral ulcer  . GERD (gastroesophageal reflux disease)    takes Omeprazole prn  . GI hemorrhage 2006, 2013   esophagitis and 1+ varices, melena in 2013   . History of blood transfusion    as a child and no abnormal reaction noted  . History of bronchitis    >60yrs ago  . Hydrocele   . Hypertension    takes Nadolol daily  . Personal history of colonic polyps   . 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 Scrotal edema   . Sleep disturbance    takes Remeron nightly  . Thrombocytopenia, unspecified ([place])   . 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 CATARACT EXTRACTION  2013   bilateral  with IOL  . CHOLECYSTECTOMY  12/31/2012   Procedure: LAPAROSCOPIC CHOLECYSTECTOMY;  Surgeon: Zenovia Jarred, MD;  Location: [facility];  Service: General;;  . COLONOSCOPY  2001, 200411/02/2006  . COLONOSCOPY  09/11/2011   Procedure: COLONOSCOPY;  Surgeon: Gatha Mayer, MD;  Location: [facility];  Service: Endoscopy;  Laterality: N/A;  . ESOPHAGOGASTRODUODENOSCOPY  2006  . ESOPHAGOGASTRODUODENOSCOPY  09/11/2011   Procedure: ESOPHAGOGASTRODUODENOSCOPY (EGD);  Surgeon: Gatha Mayer, MD;  Location: [facility];  Service: Endoscopy;  Laterality: N/A;  . ESOPHAGOGASTRODUODENOSCOPY (EGD) WITH PROPOFOL N/A 04/13/2017   Procedure: ESOPHAGOGASTRODUODENOSCOPY (EGD) WITH PROPOFOL;  Surgeon: Doran Stabler, MD;  Location: [facility];  Service: Gastroenterology;  Laterality: N/A;  . EYE SURGERY    . FLEXIBLE SIGMOIDOSCOPY  01/07/2012   Procedure: FLEXIBLE SIGMOIDOSCOPY;  Surgeon: Lafayette Dragon, MD;  Location: [facility];  Service: Endoscopy;  Laterality: N/A;  . KNEE ARTHROSCOPY  2008   Right  . KNEE ARTHROSCOPY  2008   Left  . [place] THORA/PARACENTESIS  10/28/2011  . Varicocele repair  1980's    Prior to Admission medications   Medication Sig Start Date End Date Taking? Authorizing Provider  buPROPion (WELLBUTRIN SR) 150 MG 12 hr tablet Take 1 tablet (150 mg total) by mouth daily. 04/28/17  Yes [name], MD  furosemide (LASIX) 40 MG tablet TAKE 1 TABLET (  40 MG TOTAL) BY MOUTH DAILY. 12/23/16  Yes Gatha Mayer, MD  HYDROcodone-acetaminophen (NORCO/VICODIN) 5-325 MG tablet TAKE 1 TABLET BY MOUTH 3 TIMES A DAY AS NEEDED Patient taking differently: Take 1 tablet by mouth 3 (three) times daily as needed (for pain.).  04/04/17  Yes Jearld Fenton, NP  mirtazapine (REMERON) 45 MG tablet TAKE 1 TABLET (45 MG TOTAL) BY MOUTH AT BEDTIME. 01/06/17  Yes [name], MD  Multiple Vitamin (MULTIVITAMIN WITH MINERALS) TABS Take 1 tablet by mouth daily with supper.    Yes [provider]   nadolol (CORGARD) 40 MG tablet TAKE 1 TABLET (40 MG TOTAL) BY MOUTH DAILY. 10/24/16  Yes Gatha Mayer, MD  omeprazole (PRILOSEC) 40 MG capsule Take 1 capsule (40 mg total) by mouth 2 (two) times daily. 04/16/17  Yes Barton Dubois, MD  PREVIDENT 5000 ENAMEL PROTECT 1.1-5 % PSTE Place 1 application onto teeth at bedtime. 04/02/17  Yes [provider]  psyllium (METAMUCIL) 58.6 % powder Take 1 packet by mouth at bedtime.    Yes [provider]  spironolactone (ALDACTONE) 50 MG tablet Take 2 tablets (100 mg total) by mouth daily. 12/31/16  Yes Gatha Mayer, MD  vitamin C (ASCORBIC ACID) 500 MG tablet Take 500 mg by mouth daily as needed (for immune system support.).    Yes [provider]    Current Facility-Administered Medications  Medication Dose Route Frequency Provider Last Rate Last Dose  . lactated ringers infusion   Intravenous Continuous Gatha Mayer, MD 125 mL/hr at 05/02/17 1044      Allergies as of 04/18/2017 - Review Complete 04/18/2017  Allergen Reaction Noted  . Bee venom  03/08/2015  . Nsaids Other (See Comments) 12/21/2012  . Other  12/23/2012    Family History  Problem Relation Age of Onset  . Stroke Mother   . Diabetes Mother   . Liver cancer Cousin   . Liver cancer Paternal Uncle   . Colon cancer Neg Hx   . Stomach cancer Neg Hx   . Esophageal cancer Neg Hx     Social History   Social History  . Marital status: Married    Spouse name: N/A  . Number of children: 1  . Years of education: N/A   Occupational History  . Retired     mostly from [employer]   Social History Main Topics  . Smoking status: Current Some Day Smoker    Packs/day: 0.50    Types: Cigarettes  . Smokeless tobacco: Never Used     Comment: current using E cigarette, also  . Alcohol use No  . Drug use: No  . Sexual activity: No   Other Topics Concern  . Not on file   Social History Narrative   Married- 2nd   1 son (adopted) from previous marriage       Has living will   Wife is  health care POA   Would accept attempts at resuscitation   No tube feeds if cognitively unaware          Review of Systems: Positive for weakness All other review of systems negative except as mentioned in the HPI.  Physical Exam: Vital signs in last 24 hours: Temp:  [97.5 F (36.4 C)] 97.5 F (36.4 C) (10/19 1038) Pulse Rate:  [58] 58 (10/19 1038) Resp:  [15] 15 (10/19 1038) BP: (149)/(63) 149/63 (10/19 1038) SpO2:  [100 %] 100 % (10/19 1038) Weight:  [178 lb (80.7 kg)]  178 lb (80.7 kg) (10/19 1038)   General:   Alert,  Well-developed, well-nourished, pleasant and cooperative in NAD Lungs:  Clear throughout to auscultation.   Heart:  Regular rate and rhythm; no murmurs, clicks, rubs,  or gallops. Abdomen:  Soft, nontender and nondistended. Normal bowel sounds.  + ascites - small Neuro/Psych:  Alert and cooperative. Normal mood and affect. A and O x 3   @Soma Lizak  Simonne Maffucci, MD, [facility] [phone] (pager) 05/02/2017 10:48 AM@

## 2017-05-02 NOTE — Anesthesia Procedure Notes (Signed)
Date/Time: 05/02/2017 11:22 AM Performed by: Cynda Familia Pre-anesthesia Checklist: Patient identified, Emergency Drugs available, Suction available, Patient being monitored and Timeout performed Oxygen Delivery Method: Simple face mask Placement Confirmation: positive ETCO2 and breath sounds checked- equal and bilateral Dental Injury: Teeth and Oropharynx as per pre-operative assessment  Comments: Extubated to simple face mask--- good AW to PACU O 2 intact

## 2017-05-02 NOTE — Anesthesia Postprocedure Evaluation (Signed)
Anesthesia Post Note  Patient: Billy [name]  Procedure(s) Performed: ESOPHAGOGASTRODUODENOSCOPY (EGD) WITH PROPOFOL (N/A ) ESOPHAGEAL BANDING (N/A )     Patient location during evaluation: PACU Anesthesia Type: General Level of consciousness: awake and alert Pain management: pain level controlled Vital Signs Assessment: post-procedure vital signs reviewed and stable Respiratory status: spontaneous breathing, nonlabored ventilation, respiratory function stable and patient connected to nasal cannula oxygen Cardiovascular status: blood pressure returned to baseline and stable Postop Assessment: no apparent nausea or vomiting Anesthetic complications: no    Last Vitals:  Vitals:   05/02/17 1130 05/02/17 1140  BP: (!) 123/55 119/65  Pulse: 65 65  Resp: 18 (!) 9  Temp:    SpO2: 100% 100%    Last Pain:  Vitals:   05/02/17 1126  TempSrc: Oral                 Robben Jagiello S

## 2017-05-02 NOTE — Transfer of Care (Signed)
Immediate Anesthesia Transfer of Care Note  Patient: Billy [name]  Procedure(s) Performed: ESOPHAGOGASTRODUODENOSCOPY (EGD) WITH PROPOFOL (N/A ) ESOPHAGEAL BANDING (N/A )  Patient Location: PACU and Endoscopy Unit  Anesthesia Type:General  Level of Consciousness: awake and alert   Airway & Oxygen Therapy: Patient Spontanous Breathing and Patient connected to face mask oxygen  Post-op Assessment: Report given to RN and Post -op Vital signs reviewed and stable  Post vital signs: Reviewed and stable  Last Vitals:  Vitals:   05/02/17 1038  BP: (!) 149/63  Pulse: (!) 58  Resp: 15  Temp: (!) 36.4 C  SpO2: 100%    Last Pain:  Vitals:   05/02/17 1038  TempSrc: Oral         Complications: No apparent anesthesia complications

## 2017-05-02 NOTE — Op Note (Signed)
[facility] Patient Name: Billy [name] Procedure Date: 05/02/2017 MRN: [id number] Attending MD: Gatha Mayer , MD Date of Birth: 1945-12-04 CSN: [id number] Age: 71 Admit Type: Ambulatory Procedure:                Upper GI endoscopy Indications:              2nd degree variceal eradication (following bleed) Providers:                Gatha Mayer, MD, Zenon Mayo, RN, Tinnie Gens,                            Technician Referring MD:              Medicines:                General Anesthesia Complications:            No immediate complications. Estimated Blood Loss:     Estimated blood loss: none. Procedure:                Pre-Anesthesia Assessment:                           - Prior to the procedure, a History and Physical                            was performed, and patient medications and                            allergies were reviewed. The patient's tolerance of                            previous anesthesia was also reviewed. The risks                            and benefits of the procedure and the sedation                            options and risks were discussed with the patient.                            All questions were answered, and informed consent                            was obtained. Prior Anticoagulants: The patient has                            taken no previous anticoagulant or antiplatelet                            agents. ASA Grade Assessment: III - A patient with                            severe systemic disease. After reviewing the risks  and benefits, the patient was deemed in                            satisfactory condition to undergo the procedure.                           After obtaining informed consent, the endoscope was                            passed under direct vision. Throughout the                            procedure, the patient's blood pressure, pulse, and   oxygen saturations were monitored continuously. The                            Endoscope was introduced through the mouth, and                            advanced to the prepyloric region, stomach. The                            upper GI endoscopy was accomplished without                            difficulty. The patient tolerated the procedure                            well. Scope In: Scope Out: Findings:      Small (< 5 mm) varices were found in the distal esophagus. relatively       flat and 2 superficial post-banding ulcers. No bleeding stigmata.       Estimated blood loss: none.      Moderate portal hypertensive gastropathy was found in the entire       examined stomach. Slight contact friability.      The cardia and gastric fundus were normal on retroflexion. Impression:               - Esophageal ulcers.                           - Small (< 5 mm) esophageal varices. 2 post banding                            ulcers.                           - Portal hypertensive gastropathy.                           - No specimens collected. Moderate Sedation:      N/A- Per Anesthesia Care Recommendation:           - Patient has a contact number available for                            emergencies. The signs and symptoms  of potential                            delayed complications were discussed with the                            patient. Return to normal activities tomorrow.                            Written discharge instructions were provided to the                            patient.                           - Resume previous diet.                           - Continue present medications.                           - Repeat upper endoscopy in 3 months for                            surveillance.                           - Return to my office in 6 weeks. Procedure Code(s):        --- Professional ---                           [phone], Esophagoscopy, flexible, transoral; with                             transendoscopic balloon dilation (less than 30 mm                            diameter) Diagnosis Code(s):        --- Professional ---                           K22.10, Ulcer of esophagus without bleeding                           I85.00, Esophageal varices without bleeding                           K76.6, Portal hypertension                           K31.89, Other diseases of stomach and duodenum CPT copyright 2016 American Medical Association. All rights reserved. The codes documented in this report are preliminary and upon coder review may  be revised to meet current compliance requirements. Gatha Mayer, MD 05/02/2017 11:17:06 AM This report has been signed electronically. Number of Addenda: 0

## 2017-05-02 NOTE — Anesthesia Preprocedure Evaluation (Addendum)
Anesthesia Evaluation  Patient identified by MRN, date of birth, ID band Patient awake    Reviewed: Allergy & Precautions, NPO status , Patient's Chart, lab work & pertinent test results  Airway Mallampati: II  TM Distance: >3 FB Neck ROM: Full    Dental no notable dental hx. (+) Dental Advisory Given, Poor Dentition, Chipped   Pulmonary Current Smoker,    Pulmonary exam normal breath sounds clear to auscultation       Cardiovascular hypertension, Normal cardiovascular exam Rhythm:Regular Rate:Normal     Neuro/Psych negative neurological ROS  negative psych ROS   GI/Hepatic GERD  ,(+) Cirrhosis       ,   Endo/Other  negative endocrine ROS  Renal/GU negative Renal ROS  negative genitourinary   Musculoskeletal negative musculoskeletal ROS (+)   Abdominal   Peds negative pediatric ROS (+)  Hematology negative hematology ROS (+)   Anesthesia Other Findings   Reproductive/Obstetrics negative OB ROS                            Anesthesia Physical Anesthesia Plan  ASA: III  Anesthesia Plan: General   Post-op Pain Management:    Induction: Intravenous  PONV Risk Score and Plan: 1 and Ondansetron and Dexamethasone  Airway Management Planned: Oral ETT  Additional Equipment:   Intra-op Plan:   Post-operative Plan: Extubation in OR  Informed Consent: I have reviewed the patients History and Physical, chart, labs and discussed the procedure including the risks, benefits and alternatives for the proposed anesthesia with the patient or authorized representative who has indicated his/her understanding and acceptance.   Dental advisory given  Plan Discussed with: CRNA and Surgeon  Anesthesia Plan Comments:         Anesthesia Quick Evaluation

## 2017-05-05 ENCOUNTER — Encounter (HOSPITAL_COMMUNITY): Payer: Self-pay | Admitting: Internal Medicine

## 2017-05-06 ENCOUNTER — Other Ambulatory Visit: Payer: Self-pay | Admitting: Internal Medicine

## 2017-05-06 ENCOUNTER — Telehealth: Payer: Self-pay | Admitting: Internal Medicine

## 2017-05-06 ENCOUNTER — Encounter: Payer: Self-pay | Admitting: Internal Medicine

## 2017-05-06 ENCOUNTER — Other Ambulatory Visit: Payer: Self-pay

## 2017-05-06 ENCOUNTER — Other Ambulatory Visit (HOSPITAL_COMMUNITY): Payer: Self-pay | Admitting: Internal Medicine

## 2017-05-06 MED ORDER — HYDROCODONE-ACETAMINOPHEN 5-325 MG PO TABS: 1.0000 | ORAL_TABLET | Freq: Three times a day (TID) | ORAL | 0 refills | Status: DC | PRN

## 2017-05-06 NOTE — Telephone Encounter (Signed)
Copied from [place] #[id number]. Topic: Quick Communication - See Telephone Encounter >> May 06, 2017  8:30 AM Aurelio Brash B wrote: CRM for notification. See Telephone encounter for: 05/06/17. PT asking for refill on hydrocodone

## 2017-05-06 NOTE — Telephone Encounter (Signed)
Copied from [place] [phone]. Topic: Quick Communication - See Telephone Encounter >> May 06, 2017 12:52 PM Bea Graff, NT wrote: CRM for notification. See Telephone encounter for:  05/06/17. Pt needing refill of hydrocodone.

## 2017-05-06 NOTE — Telephone Encounter (Signed)
Copied from [place] #[id number]. Topic: Quick Communication - See Telephone Encounter >> May 06, 2017  4:09 PM Burnis Medin, NT wrote: CRM for notification. See Telephone encounter for:  05/06/17. Pharmacy called and said that on the prescription refill bottle in the comment section that it was crossed out and was calling to check the date.      Pharmacy called back and stated n the prescription bottle in the comment it stated do not refill and pharmacy wants to verify.

## 2017-05-06 NOTE — Telephone Encounter (Signed)
Copied from [place] [phone]. Topic: Quick Communication - See Telephone Encounter >> May 06, 2017  8:17 AM Aurelio Brash B wrote: CRM for notification. See Telephone encounter for: 05/06/17. Med refill hydrocodone acetemphine

## 2017-05-06 NOTE — Telephone Encounter (Signed)
Pt. Requesting refill of hydrocodone.

## 2017-05-06 NOTE — Telephone Encounter (Signed)
Pt is questioning the #30 quantity for the Hydrocoodne rx that he picked up. He did not want to leave it. Does he need a new rx for the remaining monthly quantity?

## 2017-05-06 NOTE — Telephone Encounter (Signed)
Call center requesting rx hydrocodone apap. Call when ready for pick up. Last printed # 90 on 04/04/17 by Avie Echevaria NP; last seen 04/18/17.Please advise. UDS 10/16/16.

## 2017-05-06 NOTE — Telephone Encounter (Signed)
Mr. Billy [name] came in today for his prescription. He states it is written incorrectly. It reads 30 pills and 3 per day as needed. Mr. Billy [name] states it should read 90 pills 3 pills per day as needed. He did take his prescription with him today.

## 2017-05-06 NOTE — Telephone Encounter (Signed)
Spoke to pt and informed him Rx is available for pickup

## 2017-05-06 NOTE — Telephone Encounter (Signed)
Copied from [place] [phone]. Topic: Quick Communication - See Telephone Encounter >> May 06, 2017  8:17 AM Aurelio Brash B wrote: CRM for notification. See Telephone encounter for: 05/06/17. Med refill hydrocodone acetemphine

## 2017-05-07 ENCOUNTER — Ambulatory Visit: Payer: 59 | Admitting: Nurse Practitioner

## 2017-05-07 MED ORDER — HYDROCODONE-ACETAMINOPHEN 5-325 MG PO TABS: 1.0000 | ORAL_TABLET | Freq: Three times a day (TID) | ORAL | 0 refills | Status: DC | PRN

## 2017-05-07 NOTE — Telephone Encounter (Signed)
New rx up front ready for pickup. Spoke to pt and apologized about the correction that had been made by the [place].

## 2017-05-07 NOTE — Telephone Encounter (Signed)
See previous message about rx

## 2017-05-07 NOTE — Telephone Encounter (Signed)
Oh--then rewrite it for #90  Not sure why it was prepared as #30 yesterday

## 2017-05-07 NOTE — Telephone Encounter (Signed)
Apparently the # was changed from 90 to 30 when the Rx was prepared for my signature (I don't know why). I just signed but didn't notice. Please apologize Prepare additional Rx for #60

## 2017-05-07 NOTE — Telephone Encounter (Signed)
I built the rx for #60 but unable to print

## 2017-05-19 ENCOUNTER — Ambulatory Visit (INDEPENDENT_AMBULATORY_CARE_PROVIDER_SITE_OTHER): Payer: 59 | Admitting: Internal Medicine

## 2017-05-19 ENCOUNTER — Encounter: Payer: Self-pay | Admitting: Internal Medicine

## 2017-05-19 VITALS — BP 108/60 | HR 58 | Temp 98.0°F | Wt 174.0 lb

## 2017-05-19 DIAGNOSIS — F1014 Alcohol abuse with alcohol-induced mood disorder: Secondary | ICD-10-CM | POA: Diagnosis not present

## 2017-05-19 DIAGNOSIS — K7031 Alcoholic cirrhosis of liver with ascites: Secondary | ICD-10-CM

## 2017-05-19 DIAGNOSIS — F1120 Opioid dependence, uncomplicated: Secondary | ICD-10-CM

## 2017-05-19 NOTE — Progress Notes (Signed)
Subjective:    Patient ID: Billy [name], male    DOB: September 07, 1945, 71 y.o.   MRN: [id number]  HPI Here for follow up of alcohol use disorder Sleeping better with the 12 hour bupropion----couldn't sleep with the 24 hour  No urge to drink at all "The thought of it disgusts me" Contacted AA group---not happy with it  Mood is better Awakens easy ---looking forward to day Getting things done--- working through "To-do list" May look for seasonal work for now  No N/V Bowels are fine No abdominal pain  Current Outpatient Medications on File Prior to Visit  Medication Sig Dispense Refill  . buPROPion (WELLBUTRIN SR) 150 MG 12 hr tablet Take 1 tablet (150 mg total) by mouth daily. 30 tablet 3  . furosemide (LASIX) 40 MG tablet TAKE 1 TABLET (40 MG TOTAL) BY MOUTH DAILY. 90 tablet 1  . HYDROcodone-acetaminophen (NORCO/VICODIN) 5-325 MG tablet Take 1 tablet by mouth 3 (three) times daily as needed (for pain.). 90 tablet 0  . mirtazapine (REMERON) 45 MG tablet TAKE 1 TABLET (45 MG TOTAL) BY MOUTH AT BEDTIME. 90 tablet 1  . Multiple Vitamin (MULTIVITAMIN WITH MINERALS) TABS Take 1 tablet by mouth daily with supper.     . nadolol (CORGARD) 40 MG tablet TAKE 1 TABLET (40 MG TOTAL) BY MOUTH DAILY. 90 tablet 3  . omeprazole (PRILOSEC) 40 MG capsule Take 1 capsule (40 mg total) by mouth 2 (two) times daily. 60 capsule 3  . PREVIDENT 5000 ENAMEL PROTECT 1.1-5 % PSTE Place 1 application onto teeth at bedtime.  5  . psyllium (METAMUCIL) 58.6 % powder Take 1 packet by mouth at bedtime.     Marland Kitchen spironolactone (ALDACTONE) 50 MG tablet Take 2 tablets (100 mg total) by mouth daily. 180 tablet 1  . vitamin C (ASCORBIC ACID) 500 MG tablet Take 500 mg by mouth daily as needed (for immune system support.).      No current facility-administered medications on file prior to visit.     Allergies  Allergen Reactions  . Yellow Jacket Venom Anaphylaxis and Shortness Of Breath    Throat closes up/eye swell  shut  . Nsaids Other (See Comments)    Stomach pain Bleeding risk  . Other     Increased bleeding risk    Past Medical History:  Diagnosis Date  . Acute posthemorrhagic anemia   . Alcoholic cirrhosis of liver ([place]) 09/24/2011   Years of alcohol use. Progressed from fatty liver to cirrhosis based upon review imaging. Manifestations are portal hypertension with esophageal varices, portal gastropathy, rectal varices and he may have portal colopathy. Ascites also present. Naive to HAV and HBV Claims he has had pneumococcal vaccine   . Alcoholism ([place])   . Angiodysplasia of colon 09/11/2011   Multiple in the right colon, ablated with APC. Question if this is portal colopathy.   . Back pain   . Basal cell carcinoma   . Bilateral varicoceles   . Campylobacter diarrhea 01/07/2012  . DDD (degenerative disc disease)   . Depression    mild at most  . Diverticulosis of colon   . Erectile dysfunction   . Gastric ulcer 08/2011   small, antral ulcer  . GERD (gastroesophageal reflux disease)    takes Omeprazole prn  . GI hemorrhage 2006, 2013   esophagitis and 1+ varices, melena in 2013   . History of blood transfusion    as a child and no abnormal reaction noted  . History of bronchitis    >  80yrs ago  . Hydrocele   . Hypertension    takes Nadolol daily  . Personal history of colonic polyps   . 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 Scrotal edema   . Sleep disturbance    takes Remeron nightly  . Thrombocytopenia, unspecified ([place])   . 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 CATARACT EXTRACTION  2013   bilateral with IOL  . COLONOSCOPY  2001, 200411/02/2006  . ESOPHAGOGASTRODUODENOSCOPY  2006  . EYE SURGERY    . KNEE ARTHROSCOPY  2008   Right  . KNEE ARTHROSCOPY  2008   Left  . [place] THORA/PARACENTESIS  10/28/2011  . Varicocele repair  78's    Family History  Problem  Relation Age of Onset  . Stroke Mother   . Diabetes Mother   . Liver cancer Cousin   . Liver cancer Paternal Uncle   . Colon cancer Neg Hx   . Stomach cancer Neg Hx   . Esophageal cancer Neg Hx     Social History   Socioeconomic History  . Marital status: Married    Spouse name: Not on file  . Number of children: 1  . Years of education: Not on file  . Highest education level: Not on file  Social Needs  . Financial resource strain: Not on file  . Food insecurity - worry: Not on file  . Food insecurity - inability: Not on file  . Transportation needs - medical: Not on file  . Transportation needs - non-medical: Not on file  Occupational History  . Occupation: Retired    Comment: mostly from Scientist, research (medical)  Tobacco Use  . Smoking status: Current Some Day Smoker    Packs/day: 0.50    Types: Cigarettes  . Smokeless tobacco: Never Used  . Tobacco comment: current using E cigarette, also  Substance and Sexual Activity  . Alcohol use: No    Alcohol/week: 0.0 oz  . Drug use: No  . Sexual activity: No  Other Topics Concern  . Not on file  Social History Narrative   Married- 2nd   1 son (adopted) from previous marriage      Has living will   Wife is  health care POA   Would accept attempts at resuscitation   No tube feeds if cognitively unaware      Review of Systems Nocturia x 1-2 ---able to get back to sleep Appetite is "fantastic" Weight is stable    Objective:   Physical Exam  Constitutional: No distress.  Neck: No thyromegaly present.  Cardiovascular: Normal rate, regular rhythm and normal heart sounds. Exam reveals no gallop.  No murmur heard. Pulmonary/Chest: Effort normal and breath sounds normal. No respiratory distress. He has no wheezes. He has no rales.  Abdominal: He exhibits distension. There is no tenderness. There is no rebound and no guarding.  Musculoskeletal: He exhibits no edema.  Lymphadenopathy:    He has no cervical adenopathy.            Assessment & Plan:

## 2017-05-19 NOTE — Assessment & Plan Note (Signed)
Ascites persists but seems better Due for follow up with Dr Carlean Purl later this month

## 2017-05-19 NOTE — Assessment & Plan Note (Signed)
Remains abstinent Bupropion helping but may be making him a bit resistant to the hydrocodone No change for now

## 2017-05-19 NOTE — Assessment & Plan Note (Addendum)
Doing okay with the 3 hydrocodone daily Checked CSRS---nothing of concern

## 2017-05-23 NOTE — Progress Notes (Signed)
OK  Will get him back in December   Thanks

## 2017-05-26 NOTE — Progress Notes (Signed)
We are working [place] in on 06/16/2017 at 3:15pm.

## 2017-06-02 ENCOUNTER — Other Ambulatory Visit: Payer: Self-pay | Admitting: Internal Medicine

## 2017-06-02 MED ORDER — HYDROCODONE-ACETAMINOPHEN 5-325 MG PO TABS: 1.0000 | ORAL_TABLET | Freq: Three times a day (TID) | ORAL | 0 refills | Status: DC | PRN

## 2017-06-02 NOTE — Telephone Encounter (Signed)
Last written 05-07-17 #90 Last OV 05-19-17 Next OV 08-20-16  Pt sent MyChart message asking for refill due to the holiday coming up

## 2017-06-02 NOTE — Telephone Encounter (Signed)
Rx up front ready for pickup. MyChart message sent to pt

## 2017-06-10 ENCOUNTER — Telehealth: Payer: Self-pay

## 2017-06-10 NOTE — Telephone Encounter (Signed)
-----   Message from [name], RN sent at 12/04/2016  3:54 PM EDT ----- Needs [place] in 6 monhts- Carlean Purl

## 2017-06-10 NOTE — Telephone Encounter (Signed)
Patient contacted about setting up 6 month [place].  He wants to discuss with Dr. Carlean Purl at his OV next week.

## 2017-06-16 ENCOUNTER — Encounter: Payer: Self-pay | Admitting: Internal Medicine

## 2017-06-16 ENCOUNTER — Other Ambulatory Visit (INDEPENDENT_AMBULATORY_CARE_PROVIDER_SITE_OTHER): Payer: 59

## 2017-06-16 ENCOUNTER — Ambulatory Visit (INDEPENDENT_AMBULATORY_CARE_PROVIDER_SITE_OTHER): Payer: 59 | Admitting: Internal Medicine

## 2017-06-16 VITALS — BP 122/68 | HR 72 | Ht 74.5 in | Wt 178.4 lb

## 2017-06-16 DIAGNOSIS — N5089 Other specified disorders of the male genital organs: Secondary | ICD-10-CM

## 2017-06-16 DIAGNOSIS — K7031 Alcoholic cirrhosis of liver with ascites: Secondary | ICD-10-CM

## 2017-06-16 DIAGNOSIS — D62 Acute posthemorrhagic anemia: Secondary | ICD-10-CM

## 2017-06-16 DIAGNOSIS — I8511 Secondary esophageal varices with bleeding: Secondary | ICD-10-CM | POA: Diagnosis not present

## 2017-06-16 LAB — CBC WITH DIFFERENTIAL/PLATELET
Basophils Absolute: 0 10*3/uL (ref 0.0–0.1)
Basophils Relative: 0.8 % (ref 0.0–3.0)
Eosinophils Absolute: 0.1 10*3/uL (ref 0.0–0.7)
Eosinophils Relative: 1.5 % (ref 0.0–5.0)
HCT: 31.9 % — ABNORMAL LOW (ref 39.0–52.0)
Hemoglobin: 10.3 g/dL — ABNORMAL LOW (ref 13.0–17.0)
LYMPHS ABS: 0.9 10*3/uL (ref 0.7–4.0)
Lymphocytes Relative: 17.8 % (ref 12.0–46.0)
MCHC: 32.3 g/dL (ref 30.0–36.0)
MCV: 87.3 fl (ref 78.0–100.0)
Monocytes Absolute: 0.6 10*3/uL (ref 0.1–1.0)
Monocytes Relative: 12.4 % — ABNORMAL HIGH (ref 3.0–12.0)
Neutro Abs: 3.4 10*3/uL (ref 1.4–7.7)
Neutrophils Relative %: 67.5 % (ref 43.0–77.0)
PLATELETS: 145 10*3/uL — AB (ref 150.0–400.0)
RBC: 3.65 Mil/uL — ABNORMAL LOW (ref 4.22–5.81)
RDW: 18.4 % — AB (ref 11.5–15.5)
WBC: 5.1 10*3/uL (ref 4.0–10.5)

## 2017-06-16 NOTE — Progress Notes (Signed)
Billy [name] 71 y.o. [birth year] [id number]  Assessment & Plan:   Encounter Diagnoses  Name Primary?  . Alcoholic cirrhosis of liver with ascites ([place]) Yes  . Secondary esophageal varices with bleeding ([place])   . Scrotal edema   . Acute blood loss anemia improved    He seems improved.  Major issues are the swollen edematous scrotum.  I think he may have some ascites, we have room to go on his diuretic dosing.  I am going to check a CBC to follow-up anemia and BMET to follow-up kidney function on diuretics and cirrhosis. We will also get an abdominal ultrasound to look at the liver and also check for ascites.  This will screen for hepatocellular carcinoma.  Anticipate scheduling an EGD in February for follow-up of the varices and possible additional banding.  He will continue his nadolol.  Her February schedule not available yet so we will reach back to him about that appointment.  I appreciate the opportunity to care for this patient. CC: [name], MD Dr. Irine Seal  Subjective:   Chief Complaint: Follow-up of cirrhosis  HPI The patient is here for follow-up of his alcoholic cirrhosis.  He reports he is doing well overall though he continues to have problems with uncomfortable scrotal edema.  He has a history of a hydrocele and scrotal edema, we reviewed his visit with Dr. Jeffie Pollock from spring 2017 where we had increased his diuretics and he had improvement after that.  Dr. Jeffie Pollock thought that he was having more scrotal edema than a hydrocele problem.  The patient says the problems are the same or worse.  He did have a small amount of ascites in the abdomen on May ultrasound of this year.  I had performed a follow-up endoscopy after esophageal banding for GI bleeding, banding was in September follow-up was in October and he still had some ulcers and some small varices.  I plan on a repeat EGD in early 2019 to reassess and consider repeat banding.  He reports that Dr. Silvio Pate  thought he was depressed and added Wellbutrin to his regimen and he seems to be better, his to do list is shrinking as he is doing things again and anhedonia is much better.  He reports that he is not using alcohol after a relapse earlier this year  Wt Readings from Last 3 Encounters:  06/16/17 178 lb 6 oz (80.9 kg)  05/19/17 174 lb (78.9 kg)  05/02/17 178 lb (80.7 kg)     Allergies  Allergen Reactions  . Yellow Jacket Venom Anaphylaxis and Shortness Of Breath    Throat closes up/eye swell shut  . Nsaids Other (See Comments)    Stomach pain Bleeding risk  . Other     Increased bleeding risk   Current Meds  Medication Sig  . buPROPion (WELLBUTRIN SR) 150 MG 12 hr tablet Take 1 tablet (150 mg total) by mouth daily.  . furosemide (LASIX) 40 MG tablet TAKE 1 TABLET (40 MG TOTAL) BY MOUTH DAILY.  Marland Kitchen HYDROcodone-acetaminophen (NORCO/VICODIN) 5-325 MG tablet Take 1 tablet 3 (three) times daily as needed by mouth (for pain.).  Marland Kitchen mirtazapine (REMERON) 45 MG tablet TAKE 1 TABLET (45 MG TOTAL) BY MOUTH AT BEDTIME.  . Multiple Vitamin (MULTIVITAMIN WITH MINERALS) TABS Take 1 tablet by mouth daily with supper.   . nadolol (CORGARD) 40 MG tablet TAKE 1 TABLET (40 MG TOTAL) BY MOUTH DAILY.  Marland Kitchen omeprazole (PRILOSEC) 40 MG capsule Take 1 capsule (40  mg total) by mouth 2 (two) times daily.  Marland Kitchen PREVIDENT 5000 ENAMEL PROTECT 1.1-5 % PSTE Place 1 application onto teeth at bedtime.  . psyllium (METAMUCIL) 58.6 % powder Take 1 packet by mouth at bedtime.   Marland Kitchen spironolactone (ALDACTONE) 50 MG tablet Take 2 tablets (100 mg total) by mouth daily.  . vitamin C (ASCORBIC ACID) 500 MG tablet Take 500 mg by mouth daily as needed (for immune system support.).    Past Medical History:  Diagnosis Date  . Acute posthemorrhagic anemia   . Alcoholic cirrhosis of liver ([place]) 09/24/2011   Years of alcohol use. Progressed from fatty liver to cirrhosis based upon review imaging. Manifestations are portal hypertension with  esophageal varices, portal gastropathy, rectal varices and he may have portal colopathy. Ascites also present. Naive to HAV and HBV Claims he has had pneumococcal vaccine   . Alcoholism ([place])   . Angiodysplasia of colon 09/11/2011   Multiple in the right colon, ablated with APC. Question if this is portal colopathy.   . Back pain   . Basal cell carcinoma   . Bilateral varicoceles   . Campylobacter diarrhea 01/07/2012  . DDD (degenerative disc disease)   . Depression    mild at most  . Diverticulosis of colon   . Erectile dysfunction   . Gastric ulcer 08/2011   small, antral ulcer  . GERD (gastroesophageal reflux disease)    takes Omeprazole prn  . GI hemorrhage 2006, 2013   esophagitis and 1+ varices, melena in 2013   . History of blood transfusion    as a child and no abnormal reaction noted  . History of bronchitis    >61yrs ago  . Hydrocele   . Hypertension    takes Nadolol daily  . Personal history of colonic polyps   . 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 Scrotal edema   . Sleep disturbance    takes Remeron nightly  . Thrombocytopenia, unspecified ([place])   . 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 CATARACT EXTRACTION  2013   bilateral with IOL  . CHOLECYSTECTOMY  12/31/2012   Procedure: LAPAROSCOPIC CHOLECYSTECTOMY;  Surgeon: Zenovia Jarred, MD;  Location: [facility];  Service: General;;  . COLONOSCOPY  2001, 200411/02/2006  . COLONOSCOPY  09/11/2011   Procedure: COLONOSCOPY;  Surgeon: Gatha Mayer, MD;  Location: [facility];  Service: Endoscopy;  Laterality: N/A;  . ESOPHAGEAL BANDING N/A 05/02/2017   Procedure: ESOPHAGEAL BANDING;  Surgeon: Gatha Mayer, MD;  Location: [facility];  Service: Endoscopy;  Laterality: N/A;  . ESOPHAGOGASTRODUODENOSCOPY  2006  . ESOPHAGOGASTRODUODENOSCOPY  09/11/2011   Procedure: ESOPHAGOGASTRODUODENOSCOPY (EGD);  Surgeon: Gatha Mayer, MD;  Location: [facility];  Service: Endoscopy;  Laterality: N/A;  . ESOPHAGOGASTRODUODENOSCOPY (EGD) WITH PROPOFOL N/A 04/13/2017   Procedure: ESOPHAGOGASTRODUODENOSCOPY (EGD) WITH PROPOFOL;  Surgeon: Doran Stabler, MD;  Location: [facility];  Service: Gastroenterology;  Laterality: N/A;  . ESOPHAGOGASTRODUODENOSCOPY (EGD) WITH PROPOFOL N/A 05/02/2017   Procedure: ESOPHAGOGASTRODUODENOSCOPY (EGD) WITH PROPOFOL;  Surgeon: Gatha Mayer, MD;  Location: [facility];  Service: Endoscopy;  Laterality: N/A;  . EYE SURGERY    . FLEXIBLE SIGMOIDOSCOPY  01/07/2012   Procedure: FLEXIBLE SIGMOIDOSCOPY;  Surgeon: Lafayette Dragon, MD;  Location: [facility];  Service: Endoscopy;  Laterality: N/A;  . KNEE ARTHROSCOPY  2008   Right  . KNEE ARTHROSCOPY  2008   Left  .  [place] THORA/PARACENTESIS  10/28/2011  . Varicocele repair  42's   Social History   Social History Narrative   Married- 2nd   1 son (adopted) from previous marriage      Has living will   Wife is  health care POA   Would accept attempts at resuscitation   No tube feeds if cognitively unaware      family history includes Diabetes in his mother; Liver cancer in his cousin and paternal uncle; Stroke in his mother.   Review of Systems As per HPI  Objective:   Physical Exam BP 122/68   Pulse 72   Ht 6' 2.5" (1.892 m)   Wt 178 lb 6 oz (80.9 kg)   BMI 22.60 kg/m  No acute distress mildly chronically ill The lungs are clear bilaterally Heart sounds S1-S2 no rubs murmurs or gallops The abdomen shows some bulging of the flanks consistent with a small amount of ascites or perhaps a bit more.  Some mildly prominent abdominal wall veins are noted GU exam shows a circumcised penis, the scrotum is edematous right greater than left, hydrocele suspected in the right. He is alert and oriented x3 and has an appropriate mood and affect. The extremities are free of cyanosis clubbing or edema.

## 2017-06-16 NOTE — Patient Instructions (Addendum)
  Your physician has requested that you go to the basement for the following lab work before leaving today: BMET, CBC/diff  We will contact you about doing an EGD in February at [facility] have been scheduled for an abdominal ultrasound at [facility] (1st floor of hospital) on 06/25/17 at 10:30AM. Please arrive 15 minutes prior to your appointment for registration. Make certain not to have anything to eat or drink 6 hours prior to your appointment. Should you need to reschedule your appointment, please contact radiology at [phone]. This test typically takes about 30 minutes to perform.   I appreciate the opportunity to care for you. Silvano Rusk, MD, [facility]

## 2017-06-17 LAB — BASIC METABOLIC PANEL
BUN: 12 mg/dL (ref 6–23)
CALCIUM: 9.1 mg/dL (ref 8.4–10.5)
CO2: 30 mEq/L (ref 19–32)
CREATININE: 0.82 mg/dL (ref 0.40–1.50)
Chloride: 99 mEq/L (ref 96–112)
GFR: 98.94 mL/min (ref >60.00)
Glucose, Bld: 158 mg/dL — ABNORMAL HIGH (ref 70–99)
Potassium: 4 mEq/L (ref 3.5–5.1)
Sodium: 137 mEq/L (ref 135–145)

## 2017-06-17 NOTE — Progress Notes (Signed)
My Chart note

## 2017-06-25 ENCOUNTER — Ambulatory Visit (HOSPITAL_COMMUNITY): Payer: 59

## 2017-06-27 ENCOUNTER — Encounter: Payer: Self-pay | Admitting: Internal Medicine

## 2017-06-27 ENCOUNTER — Other Ambulatory Visit: Payer: Self-pay | Admitting: Internal Medicine

## 2017-06-27 MED ORDER — BUPROPION HCL ER (SR) 150 MG PO TB12: 150.0000 mg | ORAL_TABLET | Freq: Every day | ORAL | 3 refills | Status: DC

## 2017-06-27 NOTE — Telephone Encounter (Signed)
I spoke with pt and his ins will no longer cover the 30 day supply and pt would have to stop taking due to expense if cannot get the 90 day supply required by ins. Pt said taking the wellbutrin 150 mg daily it is helping a lot. Please advise. [facility] [place]. Pt has f/u appt scheduled 08/2017.

## 2017-06-27 NOTE — Telephone Encounter (Signed)
Copied from [place]. Topic: Quick Communication - Rx Refill/Question >> Jun 26, 2017  9:51 AM Antonieta Iba C wrote: Self.   Request refill for [facility] SR) 150 MG 12 hr tablet  - pt is requesting a 90 day supply.   [facility]: [id number] [place], [place] - [address]   Agent: Please be advised that RX refills may take up to 3 business days. We ask that you follow-up with your pharmacy. / LOV with Dr. Silvio Pate on 05-29-17

## 2017-06-27 NOTE — Telephone Encounter (Signed)
Approved:#90 x 3

## 2017-06-27 NOTE — Telephone Encounter (Signed)
Rx sent electronically.  

## 2017-06-27 NOTE — Addendum Note (Signed)
Addended by: Pilar Grammes on: 06/27/2017 02:08 PM   Modules accepted: Orders

## 2017-06-30 ENCOUNTER — Telehealth: Payer: Self-pay

## 2017-06-30 ENCOUNTER — Ambulatory Visit (HOSPITAL_COMMUNITY)
Admission: RE | Admit: 2017-06-30 | Discharge: 2017-06-30 | Disposition: A | Discharge status: Home / Self Care | Payer: 59 | Source: Ambulatory Visit | Attending: Internal Medicine | Admitting: Internal Medicine

## 2017-06-30 DIAGNOSIS — K7031 Alcoholic cirrhosis of liver with ascites: Secondary | ICD-10-CM

## 2017-06-30 DIAGNOSIS — R140 Abdominal distension (gaseous): Secondary | ICD-10-CM | POA: Diagnosis not present

## 2017-06-30 DIAGNOSIS — R188 Other ascites: Secondary | ICD-10-CM | POA: Diagnosis not present

## 2017-06-30 DIAGNOSIS — K7460 Unspecified cirrhosis of liver: Secondary | ICD-10-CM | POA: Insufficient documentation

## 2017-06-30 DIAGNOSIS — I8511 Secondary esophageal varices with bleeding: Secondary | ICD-10-CM

## 2017-06-30 NOTE — Progress Notes (Signed)
Let him know small ascites Liver without signs of tumor  I am recommending he increase spironolactone to 150 mg every AM to see if that will get fluid off and help swelling of scrotum also he has 50 mg and taking 100 mg dose but will  need a new Rx   He was previously told to start ferrous sulfate by My chart so double check that  Needs: 1) CBC and BMET in 1 month and I will ask how swelling is then 2) F/U  Office 4 months 30 RUQ [place] 6 mos

## 2017-06-30 NOTE — Telephone Encounter (Signed)
Offered patient Feb. 25th 2019 at [facility] to get his EGD done. This is Dr Celesta Aver block.  He is going to check his schedule and call me back and let me know if this will work.in the next several days.  He is due for a check on his cirrhosis and varices. Need MAC.

## 2017-07-01 ENCOUNTER — Other Ambulatory Visit: Payer: Self-pay

## 2017-07-01 DIAGNOSIS — R188 Other ascites: Secondary | ICD-10-CM

## 2017-07-01 MED ORDER — SPIRONOLACTONE 50 MG PO TABS: 150.0000 mg | ORAL_TABLET | Freq: Every day | ORAL | 6 refills | Status: DC

## 2017-07-02 ENCOUNTER — Other Ambulatory Visit: Payer: Self-pay | Admitting: Internal Medicine

## 2017-07-02 MED ORDER — HYDROCODONE-ACETAMINOPHEN 5-325 MG PO TABS: 1.0000 | ORAL_TABLET | Freq: Three times a day (TID) | ORAL | 0 refills | Status: DC | PRN

## 2017-07-02 NOTE — Telephone Encounter (Signed)
Last Rx 06/02/2017. Last OV 05/19/2017

## 2017-07-02 NOTE — Telephone Encounter (Signed)
Spoke to pt and informed him Rx is available for pickup from the front desk 

## 2017-07-03 ENCOUNTER — Other Ambulatory Visit: Payer: Self-pay | Admitting: Internal Medicine

## 2017-07-03 DIAGNOSIS — R188 Other ascites: Secondary | ICD-10-CM

## 2017-07-03 NOTE — Telephone Encounter (Signed)
How many refills okay Sir? Thank you.

## 2017-07-03 NOTE — Telephone Encounter (Signed)
Refill x6

## 2017-07-11 ENCOUNTER — Other Ambulatory Visit: Payer: Self-pay | Admitting: Internal Medicine

## 2017-07-11 MED ORDER — OMEPRAZOLE 40 MG PO CPDR: 40.0000 mg | DELAYED_RELEASE_CAPSULE | Freq: Two times a day (BID) | ORAL | 0 refills | Status: DC

## 2017-07-11 NOTE — Telephone Encounter (Signed)
Copied from [place] [id number]. Topic: Quick Communication - See Telephone Encounter >> Jul 11, 2017  1:15 PM Conception Chancy, NT wrote: CRM for notification. See Telephone encounter for:  07/11/17.  Patient states he is needing to get a refill on Omeprazole 40mg  and that his insurance will only pay for it if its a 90 day supply and he states he is supposed to take 2 a day. Please advise.   [facility]

## 2017-07-11 NOTE — Telephone Encounter (Signed)
rx sent to requested pharmacy for 90D

## 2017-07-18 ENCOUNTER — Other Ambulatory Visit: Payer: Self-pay | Admitting: Internal Medicine

## 2017-07-18 DIAGNOSIS — K7030 Alcoholic cirrhosis of liver without ascites: Secondary | ICD-10-CM

## 2017-07-18 NOTE — Telephone Encounter (Signed)
Patient states he is ready to get EGD sch with Dr.Gessner at [facility].

## 2017-07-18 NOTE — Telephone Encounter (Signed)
I set up Billy [name] on 09/16/17 at 2:00pm for his pre-visit appointment and then on 09/30/17 for his EGD with MAC at [facility] for his alcoholic cirrhosis/varcies, K70.31.

## 2017-07-28 ENCOUNTER — Encounter: Payer: Self-pay | Admitting: Internal Medicine

## 2017-07-31 ENCOUNTER — Other Ambulatory Visit: Payer: Self-pay | Admitting: Internal Medicine

## 2017-07-31 MED ORDER — HYDROCODONE-ACETAMINOPHEN 5-325 MG PO TABS: 1.0000 | ORAL_TABLET | Freq: Three times a day (TID) | ORAL | 0 refills | Status: DC | PRN

## 2017-07-31 NOTE — Telephone Encounter (Signed)
Ok to refill? Electronically refill request for HYDROcodone-acetaminophen (NORCO/VICODIN) 5-325 MG tablet  #90 with 0 refill.  Last prescribed on 07/02/2017. Last seen on 05/19/2017.

## 2017-08-05 ENCOUNTER — Telehealth: Payer: Self-pay | Admitting: Internal Medicine

## 2017-08-05 NOTE — Telephone Encounter (Signed)
Patient notified that he is due for labs for about 1 month after his [place] and that he can come anytime

## 2017-08-11 ENCOUNTER — Other Ambulatory Visit (INDEPENDENT_AMBULATORY_CARE_PROVIDER_SITE_OTHER): Payer: 59

## 2017-08-11 DIAGNOSIS — R188 Other ascites: Secondary | ICD-10-CM | POA: Diagnosis not present

## 2017-08-11 LAB — CBC WITH DIFFERENTIAL/PLATELET
BASOS PCT: 0.8 % (ref 0.0–3.0)
Basophils Absolute: 0 10*3/uL (ref 0.0–0.1)
EOS PCT: 1.1 % (ref 0.0–5.0)
Eosinophils Absolute: 0.1 10*3/uL (ref 0.0–0.7)
HEMATOCRIT: 37.8 % — AB (ref 39.0–52.0)
HEMOGLOBIN: 12.3 g/dL — AB (ref 13.0–17.0)
LYMPHS PCT: 17 % (ref 12.0–46.0)
Lymphs Abs: 1 10*3/uL (ref 0.7–4.0)
MCHC: 32.6 g/dL (ref 30.0–36.0)
MCV: 91.2 fl (ref 78.0–100.0)
MONO ABS: 0.7 10*3/uL (ref 0.1–1.0)
Monocytes Relative: 12.3 % — ABNORMAL HIGH (ref 3.0–12.0)
Neutro Abs: 3.9 10*3/uL (ref 1.4–7.7)
Neutrophils Relative %: 68.8 % (ref 43.0–77.0)
Platelets: 130 10*3/uL — ABNORMAL LOW (ref 150.0–400.0)
RBC: 4.14 Mil/uL — AB (ref 4.22–5.81)
RDW: 22.3 % — AB (ref 11.5–15.5)
WBC: 5.7 10*3/uL (ref 4.0–10.5)

## 2017-08-11 LAB — BASIC METABOLIC PANEL
BUN: 14 mg/dL (ref 6–23)
CHLORIDE: 101 meq/L (ref 96–112)
CO2: 31 mEq/L (ref 19–32)
Calcium: 9.1 mg/dL (ref 8.4–10.5)
Creatinine, Ser: 1 mg/dL (ref 0.40–1.50)
GFR: 78.65 mL/min (ref >60.00)
Glucose, Bld: 106 mg/dL — ABNORMAL HIGH (ref 70–99)
POTASSIUM: 5.2 meq/L — AB (ref 3.5–5.1)
SODIUM: 137 meq/L (ref 135–145)

## 2017-08-12 ENCOUNTER — Encounter: Payer: Self-pay | Admitting: Internal Medicine

## 2017-08-12 NOTE — Progress Notes (Signed)
My chart note   Labs ok - Hgb better  Will ask how swelling is

## 2017-08-20 ENCOUNTER — Ambulatory Visit (INDEPENDENT_AMBULATORY_CARE_PROVIDER_SITE_OTHER): Payer: 59 | Admitting: Internal Medicine

## 2017-08-20 ENCOUNTER — Encounter: Payer: Self-pay | Admitting: Radiology

## 2017-08-20 ENCOUNTER — Encounter: Payer: Self-pay | Admitting: Internal Medicine

## 2017-08-20 VITALS — BP 114/72 | HR 62 | Temp 98.0°F | Wt 181.0 lb

## 2017-08-20 DIAGNOSIS — F1021 Alcohol dependence, in remission: Secondary | ICD-10-CM | POA: Diagnosis not present

## 2017-08-20 DIAGNOSIS — K766 Portal hypertension: Secondary | ICD-10-CM

## 2017-08-20 DIAGNOSIS — F1120 Opioid dependence, uncomplicated: Secondary | ICD-10-CM

## 2017-08-20 DIAGNOSIS — I8511 Secondary esophageal varices with bleeding: Secondary | ICD-10-CM | POA: Diagnosis not present

## 2017-08-20 DIAGNOSIS — D696 Thrombocytopenia, unspecified: Secondary | ICD-10-CM

## 2017-08-20 NOTE — Assessment & Plan Note (Signed)
Doing okay on tid hydrocodone No issues with CSRS Due for UDS

## 2017-08-20 NOTE — Assessment & Plan Note (Signed)
Seems to be quiet Getting repeat EGD soon

## 2017-08-20 NOTE — Progress Notes (Signed)
Subjective:    Patient ID: Billy [name], male    DOB: 14-Apr-1946, 72 y.o.   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 buPROPion (WELLBUTRIN SR) 150 MG 12 hr tablet Take 1 tablet (150 mg total) by mouth daily. 90 tablet 3  . furosemide (LASIX) 40 MG tablet TAKE 1 TABLET (40 MG TOTAL) BY MOUTH DAILY. 90 tablet 5  . HYDROcodone-acetaminophen (NORCO/VICODIN) 5-325 MG tablet Take 1 tablet by mouth 3 (three) times daily as needed (for pain.). 90 tablet 0  . mirtazapine (REMERON) 45 MG tablet TAKE 1 TABLET (45 MG TOTAL) BY MOUTH AT BEDTIME. 90 tablet 3  . Multiple Vitamin (MULTIVITAMIN WITH MINERALS) TABS Take 1 tablet by mouth daily with supper.     . nadolol (CORGARD) 40 MG tablet TAKE 1 TABLET (40 MG TOTAL) BY MOUTH DAILY. 90 tablet 3  . omeprazole (PRILOSEC) 40 MG capsule Take 1 capsule (40 mg total) by mouth 2 (two) times daily. 180 capsule 0  . PREVIDENT 5000 ENAMEL PROTECT 1.1-5 % PSTE Place 1 application onto teeth at bedtime.  5  . psyllium (METAMUCIL) 58.6 % powder Take 1 packet by mouth at bedtime.     Marland Kitchen spironolactone (ALDACTONE) 50 MG tablet Take 3 tablets (150 mg total) by mouth daily. 180 tablet 6  . vitamin C (ASCORBIC ACID) 500 MG tablet Take 500 mg by mouth daily as needed (for immune system support.).      No current facility-administered medications on  file prior to visit.     Allergies  Allergen Reactions  . Yellow Jacket Venom Anaphylaxis and Shortness Of Breath    Throat closes up/eye swell shut  . Nsaids Other (See Comments)    Stomach pain Bleeding risk  . Other     Increased bleeding risk    Past Medical History:  Diagnosis Date  . Acute posthemorrhagic anemia   . Alcoholic cirrhosis of liver ([place]) 09/24/2011   Years of alcohol use. Progressed from fatty liver to cirrhosis based upon review imaging. Manifestations are portal hypertension with esophageal varices, portal gastropathy, rectal varices and he may have portal colopathy. Ascites also present. Naive to HAV and HBV Claims he has had pneumococcal vaccine   . Alcoholism ([place])   . Angiodysplasia of colon 09/11/2011   Multiple in the right colon, ablated with APC. Question if this is portal colopathy.   . Back pain   . Basal cell carcinoma   . Bilateral varicoceles   . Campylobacter diarrhea 01/07/2012  . DDD (degenerative disc disease)   . Depression    mild at most  . Diverticulosis of colon   . Erectile dysfunction   . Gastric ulcer 08/2011   small, antral ulcer  . GERD (gastroesophageal reflux disease)    takes Omeprazole prn  . GI hemorrhage 2006, 2013   esophagitis and 1+ varices, melena  in 2013   . History of blood transfusion    as a child and no abnormal reaction noted  . History of bronchitis    >71yrs ago  . Hydrocele   . Hypertension    takes Nadolol daily  . Personal history of colonic polyps   . 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 Scrotal edema   . Sleep disturbance    takes Remeron nightly  . Thrombocytopenia, unspecified ([place])   . Varices, esophageal ([place]) 08/2011   Grade 2, mid-esophagus    Past Surgical History:  Procedure Laterality Date  . CATARACT EXTRACTION  2013   bilateral with IOL  . CHOLECYSTECTOMY  12/31/2012   Procedure: LAPAROSCOPIC CHOLECYSTECTOMY;  Surgeon: Zenovia Jarred, MD;   Location: [facility];  Service: General;;  . COLONOSCOPY  2001, 200411/02/2006  . COLONOSCOPY  09/11/2011   Procedure: COLONOSCOPY;  Surgeon: Gatha Mayer, MD;  Location: [facility];  Service: Endoscopy;  Laterality: N/A;  . ESOPHAGEAL BANDING N/A 05/02/2017   Procedure: ESOPHAGEAL BANDING;  Surgeon: Gatha Mayer, MD;  Location: [facility];  Service: Endoscopy;  Laterality: N/A;  . ESOPHAGOGASTRODUODENOSCOPY  2006  . ESOPHAGOGASTRODUODENOSCOPY  09/11/2011   Procedure: ESOPHAGOGASTRODUODENOSCOPY (EGD);  Surgeon: Gatha Mayer, MD;  Location: [facility];  Service: Endoscopy;  Laterality: N/A;  . ESOPHAGOGASTRODUODENOSCOPY (EGD) WITH PROPOFOL N/A 04/13/2017   Procedure: ESOPHAGOGASTRODUODENOSCOPY (EGD) WITH PROPOFOL;  Surgeon: Doran Stabler, MD;  [facility];  Service: Gastroenterology;  Laterality: N/A;  . ESOPHAGOGASTRODUODENOSCOPY (EGD) WITH PROPOFOL N/A 05/02/2017   Procedure: ESOPHAGOGASTRODUODENOSCOPY (EGD) WITH PROPOFOL;  Surgeon: Gatha Mayer, MD;  [facility];  Service: Endoscopy;  Laterality: N/A;  . EYE SURGERY    . FLEXIBLE SIGMOIDOSCOPY  01/07/2012   Procedure: FLEXIBLE SIGMOIDOSCOPY;  Surgeon: Lafayette Dragon, MD;  [facility];  Service: Endoscopy;  Laterality: N/A;  . KNEE ARTHROSCOPY  2008   Right  . KNEE ARTHROSCOPY  2008   Left  . [place] THORA/PARACENTESIS  10/28/2011  . Varicocele repair  39's    Family History  Problem Relation Age of Onset  . Stroke Mother   . Diabetes Mother   . Liver cancer Cousin   . Liver cancer Paternal Uncle   . Colon cancer Neg Hx   . Stomach cancer Neg Hx   . Esophageal cancer Neg Hx     Social History   Socioeconomic History  . Marital status: Married    Spouse name: Not on file  . Number of children: 1  . Years of education: Not on file  . Highest education level: Not on file  Social Needs  . Financial resource strain: Not on file  . Food insecurity - worry: Not on file  . Food insecurity  - inability: Not on file  . Transportation needs - medical: Not on file  . Transportation needs - non-medical: Not on file  Occupational History  . Occupation: Retired    Comment: mostly from Scientist, research (medical)  Tobacco Use  . Smoking status: Current Some Day Smoker    Packs/day: 0.50    Types: Cigarettes  . Smokeless tobacco: Never Used  . Tobacco comment: current using E cigarette, also  Substance and Sexual Activity  . Alcohol use: No    Alcohol/week: 0.0 oz  . Drug use: No  . Sexual activity: No  Other Topics Concern  . Not on file  Social History Narrative   Married- 2nd   1 son (adopted) from  previous marriage      Has living will   Wife is  health care POA   Would accept attempts at resuscitation   No tube feeds if cognitively unaware      Review of Systems Has gained some weight due to better appetite Sleeping better now No abnormal bleeding Scrotal swelling is better--but still persists    Objective:   Physical Exam  Constitutional: He appears well-developed. No distress.  Psychiatric: He has a normal mood and affect. His behavior is normal.          Assessment & Plan:

## 2017-08-20 NOTE — Assessment & Plan Note (Signed)
With recent relapse Doing well now but discussed need for ongoing vigilance

## 2017-08-20 NOTE — Assessment & Plan Note (Signed)
Related to his cirrhosis Mild (130K) and stable

## 2017-08-20 NOTE — Assessment & Plan Note (Signed)
Scrotal swelling better on increased spironolactone

## 2017-08-23 LAB — PAIN MGMT, PROFILE 8 W/CONF, U
6 Acetylmorphine: NEGATIVE ng/mL (ref <10)
AMPHETAMINES: NEGATIVE ng/mL (ref <500)
Alcohol Metabolites: POSITIVE ng/mL — AB (ref <500)
BUPRENORPHINE, URINE: NEGATIVE ng/mL (ref <5)
Benzodiazepines: NEGATIVE ng/mL (ref <100)
CODEINE: NEGATIVE ng/mL (ref <50)
Cocaine Metabolite: NEGATIVE ng/mL (ref <150)
Creatinine: 89.1 mg/dL
ETHYL GLUCURONIDE (ETG): 1237 ng/mL — AB (ref <500)
ETHYL SULFATE (ETS): 296 ng/mL — AB (ref <100)
HYDROCODONE: 1756 ng/mL — AB (ref <50)
MDMA: NEGATIVE ng/mL (ref <500)
Marijuana Metabolite: 174 ng/mL — ABNORMAL HIGH (ref <5)
Marijuana Metabolite: POSITIVE ng/mL — AB (ref <20)
Morphine: NEGATIVE ng/mL (ref <50)
Norhydrocodone: 1295 ng/mL — ABNORMAL HIGH (ref <50)
OPIATES: POSITIVE ng/mL — AB (ref <100)
OXYCODONE: NEGATIVE ng/mL (ref <100)
Oxidant: NEGATIVE ug/mL (ref <200)
pH: 6.03 (ref 4.5–9.0)

## 2017-08-28 ENCOUNTER — Other Ambulatory Visit: Payer: Self-pay | Admitting: Internal Medicine

## 2017-08-28 MED ORDER — HYDROCODONE-ACETAMINOPHEN 5-325 MG PO TABS: 1.0000 | ORAL_TABLET | Freq: Three times a day (TID) | ORAL | 0 refills | Status: DC | PRN

## 2017-08-28 NOTE — Telephone Encounter (Signed)
Last written 07-31-17 #90 Last OV 08-20-17 (New UDS/CSA, also) Next OV 12-03-17

## 2017-09-16 ENCOUNTER — Other Ambulatory Visit: Payer: Self-pay

## 2017-09-16 ENCOUNTER — Ambulatory Visit (AMBULATORY_SURGERY_CENTER): Payer: Self-pay | Admitting: *Deleted

## 2017-09-16 VITALS — Ht 74.5 in | Wt 184.2 lb

## 2017-09-16 DIAGNOSIS — I8500 Esophageal varices without bleeding: Secondary | ICD-10-CM

## 2017-09-16 DIAGNOSIS — K2210 Ulcer of esophagus without bleeding: Secondary | ICD-10-CM

## 2017-09-16 NOTE — Progress Notes (Signed)
No egg or soy allergy known to patient  No issues with past sedation with any surgeries  or procedures, no intubation problems  No diet pills per patient No home 02 use per patient  No blood thinners per patient  Pt denies issues with constipation  No A fib or A flutter  EMMI video sent to pt's e mail pt. Has had multiple EGDs

## 2017-09-24 ENCOUNTER — Other Ambulatory Visit: Payer: Self-pay

## 2017-09-24 ENCOUNTER — Encounter (HOSPITAL_COMMUNITY): Payer: Self-pay | Admitting: Emergency Medicine

## 2017-09-25 ENCOUNTER — Other Ambulatory Visit: Payer: Self-pay | Admitting: Internal Medicine

## 2017-09-25 MED ORDER — HYDROCODONE-ACETAMINOPHEN 5-325 MG PO TABS: 1.0000 | ORAL_TABLET | Freq: Three times a day (TID) | ORAL | 0 refills | Status: DC | PRN

## 2017-09-25 NOTE — Telephone Encounter (Signed)
Last written 08-28-17 #90 Last OV 08-20-17 Next OV 12-03-17  Last UDS/CSA 08-20-17  Pt left a note stating he would not be out of med until late Saturday.

## 2017-09-30 ENCOUNTER — Ambulatory Visit: Payer: 59 | Admitting: Internal Medicine

## 2017-09-30 ENCOUNTER — Ambulatory Visit (HOSPITAL_COMMUNITY): Payer: 59 | Admitting: Anesthesiology

## 2017-09-30 ENCOUNTER — Ambulatory Visit (HOSPITAL_COMMUNITY)
Admission: RE | Admit: 2017-09-30 | Discharge: 2017-09-30 | Disposition: A | Discharge status: Home / Self Care | Payer: 59 | Source: Ambulatory Visit | Attending: Internal Medicine | Admitting: Internal Medicine

## 2017-09-30 ENCOUNTER — Encounter (HOSPITAL_COMMUNITY)
Admission: RE | Disposition: A | Discharge status: Home / Self Care | Payer: Self-pay | Source: Ambulatory Visit | Attending: Internal Medicine

## 2017-09-30 ENCOUNTER — Encounter (HOSPITAL_COMMUNITY): Payer: Self-pay | Admitting: Certified Registered Nurse Anesthetist

## 2017-09-30 ENCOUNTER — Other Ambulatory Visit: Payer: Self-pay

## 2017-09-30 DIAGNOSIS — K219 Gastro-esophageal reflux disease without esophagitis: Secondary | ICD-10-CM | POA: Diagnosis not present

## 2017-09-30 DIAGNOSIS — I8511 Secondary esophageal varices with bleeding: Secondary | ICD-10-CM | POA: Diagnosis not present

## 2017-09-30 DIAGNOSIS — G479 Sleep disorder, unspecified: Secondary | ICD-10-CM | POA: Diagnosis not present

## 2017-09-30 DIAGNOSIS — I8510 Secondary esophageal varices without bleeding: Secondary | ICD-10-CM | POA: Diagnosis not present

## 2017-09-30 DIAGNOSIS — F17210 Nicotine dependence, cigarettes, uncomplicated: Secondary | ICD-10-CM | POA: Insufficient documentation

## 2017-09-30 DIAGNOSIS — K3189 Other diseases of stomach and duodenum: Secondary | ICD-10-CM | POA: Diagnosis not present

## 2017-09-30 DIAGNOSIS — I10 Essential (primary) hypertension: Secondary | ICD-10-CM | POA: Insufficient documentation

## 2017-09-30 DIAGNOSIS — Z8719 Personal history of other diseases of the digestive system: Secondary | ICD-10-CM | POA: Diagnosis not present

## 2017-09-30 DIAGNOSIS — K766 Portal hypertension: Secondary | ICD-10-CM | POA: Insufficient documentation

## 2017-09-30 DIAGNOSIS — Z91030 Bee allergy status: Secondary | ICD-10-CM | POA: Insufficient documentation

## 2017-09-30 DIAGNOSIS — F419 Anxiety disorder, unspecified: Secondary | ICD-10-CM | POA: Diagnosis not present

## 2017-09-30 DIAGNOSIS — Z886 Allergy status to analgesic agent status: Secondary | ICD-10-CM | POA: Diagnosis not present

## 2017-09-30 DIAGNOSIS — Z79899 Other long term (current) drug therapy: Secondary | ICD-10-CM | POA: Insufficient documentation

## 2017-09-30 DIAGNOSIS — J45909 Unspecified asthma, uncomplicated: Secondary | ICD-10-CM | POA: Diagnosis not present

## 2017-09-30 DIAGNOSIS — Z85828 Personal history of other malignant neoplasm of skin: Secondary | ICD-10-CM | POA: Insufficient documentation

## 2017-09-30 DIAGNOSIS — Z800 Family history of malignant neoplasm of digestive organs: Secondary | ICD-10-CM | POA: Insufficient documentation

## 2017-09-30 DIAGNOSIS — I8500 Esophageal varices without bleeding: Secondary | ICD-10-CM | POA: Diagnosis present

## 2017-09-30 DIAGNOSIS — K7030 Alcoholic cirrhosis of liver without ascites: Secondary | ICD-10-CM | POA: Insufficient documentation

## 2017-09-30 DIAGNOSIS — F329 Major depressive disorder, single episode, unspecified: Secondary | ICD-10-CM | POA: Diagnosis not present

## 2017-09-30 DIAGNOSIS — Z86010 Personal history of colonic polyps: Secondary | ICD-10-CM | POA: Diagnosis not present

## 2017-09-30 HISTORY — PX: ESOPHAGOGASTRODUODENOSCOPY (EGD) WITH PROPOFOL: SHX5813

## 2017-09-30 SURGERY — ESOPHAGOGASTRODUODENOSCOPY (EGD) WITH PROPOFOL
Anesthesia: Monitor Anesthesia Care

## 2017-09-30 MED ORDER — LACTATED RINGERS IV SOLN
INTRAVENOUS | Status: DC
  Administered 2017-09-30 (×2): via INTRAVENOUS

## 2017-09-30 MED ORDER — PROPOFOL 10 MG/ML IV BOLUS
INTRAVENOUS | Status: DC | PRN
  Administered 2017-09-30: 50 mg via INTRAVENOUS

## 2017-09-30 MED ORDER — PROPOFOL 500 MG/50ML IV EMUL
INTRAVENOUS | Status: DC | PRN
  Administered 2017-09-30: 100 ug/kg/min via INTRAVENOUS

## 2017-09-30 MED ORDER — SODIUM CHLORIDE 0.9 % IV SOLN: INTRAVENOUS | Status: DC

## 2017-09-30 MED ORDER — LIDOCAINE 2% (20 MG/ML) 5 ML SYRINGE
INTRAMUSCULAR | Status: DC | PRN
  Administered 2017-09-30: 5 mL via INTRAVENOUS

## 2017-09-30 MED ORDER — PROPOFOL 10 MG/ML IV BOLUS
INTRAVENOUS | Status: AC
  Filled 2017-09-30: qty 60

## 2017-09-30 SURGICAL SUPPLY — 15 items

## 2017-09-30 NOTE — H&P (Signed)
[facility] History and Physical   Primary Care Physician:  [name], MD   Reason for Procedure:   follow-up varices  Plan:    EGD  The risks and benefits as well as alternatives of endoscopic procedure(s) have been discussed and reviewed. All questions answered. The patient agrees to proceed.     HPI: Billy [name] is a 72 y.o. male   Hx bleeding esophageal varices here for surveillance EGD    Past Medical History:  Diagnosis Date  . Acute posthemorrhagic anemia   . Alcoholic cirrhosis of liver ([place]) 09/24/2011   Years of alcohol use. Progressed from fatty liver to cirrhosis based upon review imaging. Manifestations are portal hypertension with esophageal varices, portal gastropathy, rectal varices and he may have portal colopathy. Ascites also present. Naive to HAV and HBV Claims he has had pneumococcal vaccine   . Alcoholism ([place])   . Angiodysplasia of colon 09/11/2011   Multiple in the right colon, ablated with APC. Question if this is portal colopathy.   . Anxiety   . Asthma   . Back pain   . Basal cell carcinoma   . Bilateral varicoceles   . Campylobacter diarrhea 01/07/2012  . Cataract    removed  . Depression    mild at most  . Diverticulosis of colon   . Erectile dysfunction   . Gastric ulcer 08/2011   small, antral ulcer  . GERD (gastroesophageal reflux disease)    takes Omeprazole prn  . GI hemorrhage 2006, 2013   esophagitis and 1+ varices, melena in 2013   . History of blood transfusion    as a child and no abnormal reaction noted  . History of bronchitis    >35yrs ago  . Hydrocele   . Hypertension    takes Nadolol daily  . Personal history of colonic polyps   . 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 Scrotal edema   . Sleep disturbance    takes Remeron nightly  . Thrombocytopenia, unspecified ([place])   . Varices, esophageal ([place]) 08/2011   Grade 2, mid-esophagus    Past Surgical History:   Procedure Laterality Date  . CATARACT EXTRACTION  2013   bilateral with IOL  . CHOLECYSTECTOMY  12/31/2012   Procedure: LAPAROSCOPIC CHOLECYSTECTOMY;  Surgeon: Zenovia Jarred, MD;  Location: [facility];  Service: General;;  . COLONOSCOPY  2001, 200411/02/2006  . COLONOSCOPY  09/11/2011   Procedure: COLONOSCOPY;  Surgeon: Gatha Mayer, MD;  [facility];  Service: Endoscopy;  Laterality: N/A;  . COLONOSCOPY    . ESOPHAGEAL BANDING N/A 05/02/2017   Procedure: ESOPHAGEAL BANDING;  Surgeon: Gatha Mayer, MD;  [facility];  Service: Endoscopy;  Laterality: N/A;  . ESOPHAGOGASTRODUODENOSCOPY  2006  . ESOPHAGOGASTRODUODENOSCOPY  09/11/2011   Procedure: ESOPHAGOGASTRODUODENOSCOPY (EGD);  Surgeon: Gatha Mayer, MD;  Location: [facility];  Service: Endoscopy;  Laterality: N/A;  . ESOPHAGOGASTRODUODENOSCOPY (EGD) WITH PROPOFOL N/A 04/13/2017   Procedure: ESOPHAGOGASTRODUODENOSCOPY (EGD) WITH PROPOFOL;  Surgeon: Doran Stabler, MD;  [facility];  Service: Gastroenterology;  Laterality: N/A;  . ESOPHAGOGASTRODUODENOSCOPY (EGD) WITH PROPOFOL N/A 05/02/2017   Procedure: ESOPHAGOGASTRODUODENOSCOPY (EGD) WITH PROPOFOL;  Surgeon: Gatha Mayer, MD;  [facility];  Service: Endoscopy;  Laterality: N/A;  . EYE SURGERY    . FLEXIBLE SIGMOIDOSCOPY  01/07/2012   Procedure: FLEXIBLE SIGMOIDOSCOPY;  Surgeon: Lafayette Dragon, MD;  [facility];  Service: Endoscopy;  Laterality: N/A;  . KNEE  ARTHROSCOPY  2008   Right  . KNEE ARTHROSCOPY  2008   Left  . POLYPECTOMY    . [place] THORA/PARACENTESIS  10/28/2011  . Varicocele repair  1980's    Prior to Admission medications   Medication Sig Start Date End Date Taking? Authorizing Provider  buPROPion (WELLBUTRIN SR) 150 MG 12 hr tablet Take 1 tablet (150 mg total) by mouth daily. 06/27/17  Yes Viviana Simpler I, MD  fluocinonide cream (LIDEX) 0.05 % APPLY ON THE SKIN TWICE A DAY AS NEEDED FOR RASH 08/29/17  Yes  [provider]  furosemide (LASIX) 40 MG tablet TAKE 1 TABLET (40 MG TOTAL) BY MOUTH DAILY. 07/03/17  Yes Gatha Mayer, MD  HYDROcodone-acetaminophen (NORCO/VICODIN) 5-325 MG tablet Take 1 tablet by mouth 3 (three) times daily as needed (for pain.). 09/25/17  Yes [name], MD  mirtazapine (REMERON) 45 MG tablet TAKE 1 TABLET (45 MG TOTAL) BY MOUTH AT BEDTIME. 07/03/17  Yes [name], MD  Multiple Vitamin (MULTIVITAMIN WITH MINERALS) TABS Take 1 tablet by mouth daily with supper.    Yes [provider]  nadolol (CORGARD) 40 MG tablet TAKE 1 TABLET (40 MG TOTAL) BY MOUTH DAILY. 10/24/16  Yes Gatha Mayer, MD  omeprazole (PRILOSEC) 40 MG capsule Take 1 capsule (40 mg total) by mouth 2 (two) times daily. 07/11/17  Yes [name], MD  PREVIDENT 5000 ENAMEL PROTECT 1.1-5 % PSTE Place 1 application onto teeth at bedtime. 04/02/17  Yes [provider]  psyllium (METAMUCIL) 58.6 % powder Take 1 packet by mouth at bedtime.    Yes [provider]  spironolactone (ALDACTONE) 50 MG tablet Take 3 tablets (150 mg total) by mouth daily. 07/01/17  Yes Gatha Mayer, MD  vitamin C (ASCORBIC ACID) 500 MG tablet Take 500 mg by mouth daily as needed (for immune system support.).    Yes [provider]    Current Facility-Administered Medications  Medication Dose Route Frequency Provider Last Rate Last Dose  . lactated ringers infusion   Intravenous Continuous Gatha Mayer, MD 20 mL/hr at 09/30/17 0902      Allergies as of 07/18/2017 - Review Complete 06/16/2017  Allergen Reaction Noted  . Yellow jacket venom Anaphylaxis and Shortness Of Breath 04/28/2017  . Nsaids Other (See Comments) 12/21/2012  . Other  12/23/2012    Family History  Problem Relation Age of Onset  . Stroke Mother   . Diabetes Mother   . Liver cancer Cousin   . Liver cancer Paternal Uncle   . Colon cancer Neg Hx   . Stomach cancer Neg Hx   . Esophageal cancer  Neg Hx   . Colon polyps Neg Hx   . Rectal cancer Neg Hx   . Pancreatic cancer Neg Hx     Social History   Socioeconomic History  . Marital status: Married    Spouse name: Not on file  . Number of children: 1  . Years of education: Not on file  . Highest education level: Not on file  Social Needs  . Financial resource strain: Not on file  . Food insecurity - worry: Not on file  . Food insecurity - inability: Not on file  . Transportation needs - medical: Not on file  . Transportation needs - non-medical: Not on file  Occupational History  . Occupation: Retired    Comment: mostly from Scientist, research (medical)  Tobacco Use  . Smoking status: Current Some Day Smoker    Packs/day:  0.50    Types: Cigarettes, E-cigarettes  . Smokeless tobacco: Never Used  . Tobacco comment: current using E cigarette, also  Substance and Sexual Activity  . Alcohol use: No    Alcohol/week: 0.0 oz  . Drug use: Yes    Types: Marijuana    Comment: occasionally  . Sexual activity: No  Other Topics Concern  . Not on file  Social History Narrative   Married- 2nd   1 son (adopted) from previous marriage      Has living will   Wife is  health care POA   Would accept attempts at resuscitation   No tube feeds if cognitively unaware       Review of Systems:  All other review of systems negative except as mentioned in the HPI.  Physical Exam: Vital signs in last 24 hours: Temp:  [98 F (36.7 C)] 98 F (36.7 C) (03/19 0855) Pulse Rate:  [61] 61 (03/19 0855) Resp:  [12] 12 (03/19 0855) BP: (155)/(71) 155/71 (03/19 0855) SpO2:  [99 %] 99 % (03/19 0855) Weight:  [184 lb (83.5 kg)] 184 lb (83.5 kg) (03/19 0855)   General:   Alert,  Well-developed, well-nourished, pleasant and cooperative in NAD Lungs:  Clear throughout to auscultation.   Heart:  Regular rate and rhythm; no murmurs, clicks, rubs,  or gallops. Abdomen:  Soft, nontender and nondistended. Normal bowel sounds.   Neuro/Psych:  Alert and  cooperative. Normal mood and affect. A and O x 3   @Daine Gunther  Simonne Maffucci, MD, [facility] [phone] (pager) 09/30/2017 10:16 AM@

## 2017-09-30 NOTE — Anesthesia Postprocedure Evaluation (Signed)
Anesthesia Post Note  Patient: Billy [name]  Procedure(s) Performed: ESOPHAGOGASTRODUODENOSCOPY (EGD) WITH PROPOFOL (N/A )     Patient location during evaluation: PACU Anesthesia Type: MAC Level of consciousness: awake and alert Pain management: pain level controlled Vital Signs Assessment: post-procedure vital signs reviewed and stable Respiratory status: spontaneous breathing, nonlabored ventilation, respiratory function stable and patient connected to nasal cannula oxygen Cardiovascular status: stable and blood pressure returned to baseline Postop Assessment: no apparent nausea or vomiting Anesthetic complications: no    Last Vitals:  Vitals:   09/30/17 1040 09/30/17 1050  BP: (!) 127/57 140/63  Pulse: 65 64  Resp: 18 16  Temp:    SpO2: 99% 97%    Last Pain:  Vitals:   09/30/17 1038  TempSrc: Oral                 Kaili Castille DAVID

## 2017-09-30 NOTE — Transfer of Care (Signed)
Immediate Anesthesia Transfer of Care Note  Patient: Billy [name]  Procedure(s) Performed: ESOPHAGOGASTRODUODENOSCOPY (EGD) WITH PROPOFOL (N/A )  Patient Location: PACU and Endoscopy Unit  Anesthesia Type:MAC  Level of Consciousness: awake, alert  and oriented  Airway & Oxygen Therapy: Patient Spontanous Breathing and Patient connected to nasal cannula oxygen  Post-op Assessment: Report given to RN and Post -op Vital signs reviewed and stable  Post vital signs: Reviewed and stable  Last Vitals:  Vitals:   09/30/17 0855  BP: (!) 155/71  Pulse: 61  Resp: 12  Temp: 36.7 C  SpO2: 99%    Last Pain:  Vitals:   09/30/17 0855  TempSrc: Oral         Complications: No apparent anesthesia complications

## 2017-09-30 NOTE — Anesthesia Preprocedure Evaluation (Addendum)
Anesthesia Evaluation  Patient identified by MRN, date of birth, ID band Patient awake    Reviewed: Allergy & Precautions, NPO status , Patient's Chart, lab work & pertinent test results  Airway Mallampati: I  TM Distance: >3 FB Neck ROM: Full    Dental   Pulmonary Current Smoker,    Pulmonary exam normal        Cardiovascular hypertension, Pt. on medications Normal cardiovascular exam     Neuro/Psych Anxiety Depression    GI/Hepatic GERD  Medicated and Controlled,(+) Cirrhosis   Esophageal Varices and ascites    ,   Endo/Other    Renal/GU      Musculoskeletal   Abdominal   Peds  Hematology   Anesthesia Other Findings   Reproductive/Obstetrics                            Anesthesia Physical Anesthesia Plan  ASA: III  Anesthesia Plan: MAC   Post-op Pain Management:    Induction: Intravenous  PONV Risk Score and Plan: 0  Airway Management Planned: Simple Face Mask  Additional Equipment:   Intra-op Plan:   Post-operative Plan:   Informed Consent: I have reviewed the patients History and Physical, chart, labs and discussed the procedure including the risks, benefits and alternatives for the proposed anesthesia with the patient or authorized representative who has indicated his/her understanding and acceptance.     Plan Discussed with: CRNA and Surgeon  Anesthesia Plan Comments:         Anesthesia Quick Evaluation

## 2017-09-30 NOTE — Discharge Instructions (Signed)
° °  I placed 2 bands today.  Will plan to repeat an EGD in about 6 months  Please call and make an appointment to see me in the office for May or June.  I appreciate the opportunity to care for you. Gatha Mayer, MD, FACG      YOU HAD AN ENDOSCOPIC PROCEDURE TODAY: Refer to the procedure report and other information in the discharge instructions given to you for any specific questions about what was found during the examination. If this information does not answer your questions, please call [place] office at [phone] to clarify.   YOU SHOULD EXPECT: Some feelings of bloating in the abdomen. Passage of more gas than usual. Walking can help get rid of the air that was put into your GI tract during the procedure and reduce the bloating. If you had a lower endoscopy (such as a colonoscopy or flexible sigmoidoscopy) you may notice spotting of blood in your stool or on the toilet paper. Some abdominal soreness may be present for a day or two, also.  DIET: Your first meal following the procedure should be a light meal and then it is ok to progress to your normal diet. A half-sandwich or bowl of soup is an example of a good first meal. Heavy or fried foods are harder to digest and may make you feel nauseous or bloated. Drink plenty of fluids but you should avoid alcoholic beverages for 24 hours. If you had a esophageal dilation, please see attached instructions for diet.    ACTIVITY: Your care partner should take you home directly after the procedure. You should plan to take it easy, moving slowly for the rest of the day. You can resume normal activity the day after the procedure however YOU SHOULD NOT DRIVE, use power tools, machinery or perform tasks that involve climbing or major physical exertion for 24 hours (because of the sedation medicines used during the test).   SYMPTOMS TO REPORT IMMEDIATELY: A gastroenterologist can be reached at any hour. Please call [phone]  for any of  the following symptoms:   Following upper endoscopy (EGD, EUS, ERCP, esophageal dilation) Vomiting of blood or coffee ground material  New, significant abdominal pain  New, significant chest pain or pain under the shoulder blades  Painful or persistently difficult swallowing  New shortness of breath  Black, tarry-looking or red, bloody stools  FOLLOW UP:  If any biopsies were taken you will be contacted by phone or by letter within the next 1-3 weeks. Call [phone]  if you have not heard about the biopsies in 3 weeks.  Please also call with any specific questions about appointments or follow up tests.

## 2017-09-30 NOTE — Op Note (Signed)
[facility] Patient Name: Billy [name] Procedure Date: 09/30/2017 MRN: [id number] Attending MD: Gatha Mayer , MD Date of Birth: [birth year] CSN: [id number] Age: 72 Admit Type: Outpatient Procedure:                Upper GI endoscopy Indications:              Esophageal varices, Follow-up of esophageal                            varices, For therapy of esophageal varices Providers:                Gatha Mayer, MD, Carmie End, RN, Cherylynn Ridges, Technician, Stephanie [place] CRNA Referring MD:              Medicines:                Propofol per Anesthesia, Monitored Anesthesia Care Complications:            No immediate complications. Estimated Blood Loss:     Estimated blood loss: none. Procedure:                Pre-Anesthesia Assessment:                           - Prior to the procedure, a History and Physical                            was performed, and patient medications and                            allergies were reviewed. The patient's tolerance of                            previous anesthesia was also reviewed. The risks                            and benefits of the procedure and the sedation                            options and risks were discussed with the patient.                            All questions were answered, and informed consent                            was obtained. Prior Anticoagulants: The patient has                            taken no previous anticoagulant or antiplatelet                            agents. ASA Grade Assessment: III - A patient with  severe systemic disease. After reviewing the risks                            and benefits, the patient was deemed in                            satisfactory condition to undergo the procedure.                           After obtaining informed consent, the endoscope was                            passed under direct vision.  Throughout the                            procedure, the patient's blood pressure, pulse, and                            oxygen saturations were monitored continuously. The                            EG-[id number] ([id number]) scope was introduced through the                            mouth, and advanced to the antrum of the stomach.                            The upper GI endoscopy was accomplished without                            difficulty. The patient tolerated the procedure                            well. Scope In: Scope Out: Findings:      Grade II varices were found in the lower third of the esophagus. They       were 6 mm in largest diameter. Two bands were successfully placed with       complete eradication, resulting in deflation of varices. Estimated blood       loss: none.      Moderate portal hypertensive gastropathy was found in the entire       examined stomach.      The cardia and gastric fundus were normal on retroflexion. Impression:               - Grade II esophageal varices. Completely                            eradicated. Banded.                           - Portal hypertensive gastropathy.                           - No specimens collected. Moderate Sedation:      N/A- Per Anesthesia Care Recommendation:           -  Patient has a contact number available for                            emergencies. The signs and symptoms of potential                            delayed complications were discussed with the                            patient. Return to normal activities tomorrow.                            Written discharge instructions were provided to the                            patient.                           - Resume previous diet.                           - Continue present medications.                           - Repeat upper endoscopy in 6 months for                            surveillance.                           - Call and get appointment with me  for May or June Procedure Code(s):        --- Professional ---                           [phone], Esophagoscopy, flexible, transoral; with                            band ligation of esophageal varices Diagnosis Code(s):        --- Professional ---                           I85.00, Esophageal varices without bleeding                           K76.6, Portal hypertension                           K31.89, Other diseases of stomach and duodenum CPT copyright 2016 American Medical Association. All rights reserved. The codes documented in this report are preliminary and upon coder review may  be revised to meet current compliance requirements. Gatha Mayer, MD 09/30/2017 10:49:25 AM This report has been signed electronically. Number of Addenda: 0

## 2017-10-01 ENCOUNTER — Encounter (HOSPITAL_COMMUNITY): Payer: Self-pay | Admitting: Internal Medicine

## 2017-10-08 ENCOUNTER — Other Ambulatory Visit: Payer: Self-pay | Admitting: Internal Medicine

## 2017-10-11 ENCOUNTER — Other Ambulatory Visit: Payer: Self-pay | Admitting: Internal Medicine

## 2017-10-13 NOTE — Telephone Encounter (Signed)
Please advise Sir? 

## 2017-10-13 NOTE — Telephone Encounter (Signed)
Refill x 1 year 

## 2017-10-23 ENCOUNTER — Other Ambulatory Visit: Payer: Self-pay | Admitting: Internal Medicine

## 2017-10-23 MED ORDER — HYDROCODONE-ACETAMINOPHEN 5-325 MG PO TABS: 1.0000 | ORAL_TABLET | Freq: Three times a day (TID) | ORAL | 0 refills | Status: DC | PRN

## 2017-10-23 NOTE — Telephone Encounter (Signed)
Last rx:  09/25/17, #90 Last OV:  08/20/17 Next OV:  12/03/17

## 2017-11-19 ENCOUNTER — Other Ambulatory Visit: Payer: Self-pay | Admitting: Internal Medicine

## 2017-11-19 MED ORDER — HYDROCODONE-ACETAMINOPHEN 5-325 MG PO TABS: 1.0000 | ORAL_TABLET | Freq: Three times a day (TID) | ORAL | 0 refills | Status: DC | PRN

## 2017-11-19 NOTE — Telephone Encounter (Signed)
Last filled 10-23-17 #90 Last OV 08-20-17 Next OV 12-03-17 Last UDS/CSA 08-20-17

## 2017-11-19 NOTE — Telephone Encounter (Signed)
Patient comment: I'm requesting a refill a couple days early because my wife and I wanted to go away for a few days leaving Friday. Thanks

## 2017-12-03 ENCOUNTER — Ambulatory Visit (INDEPENDENT_AMBULATORY_CARE_PROVIDER_SITE_OTHER): Payer: 59 | Admitting: Internal Medicine

## 2017-12-03 ENCOUNTER — Encounter: Payer: Self-pay | Admitting: Internal Medicine

## 2017-12-03 VITALS — BP 120/72 | HR 58 | Temp 98.2°F | Ht 74.0 in | Wt 175.0 lb

## 2017-12-03 DIAGNOSIS — F1021 Alcohol dependence, in remission: Secondary | ICD-10-CM

## 2017-12-03 DIAGNOSIS — F1120 Opioid dependence, uncomplicated: Secondary | ICD-10-CM | POA: Diagnosis not present

## 2017-12-03 DIAGNOSIS — Z0000 Encounter for general adult medical examination without abnormal findings: Secondary | ICD-10-CM

## 2017-12-03 DIAGNOSIS — K7031 Alcoholic cirrhosis of liver with ascites: Secondary | ICD-10-CM

## 2017-12-03 MED ORDER — FLUOCINONIDE 0.05 % EX CREA: TOPICAL_CREAM | CUTANEOUS | 2 refills | Status: DC

## 2017-12-03 NOTE — Assessment & Plan Note (Signed)
Continues on the norco for his back CSRS reviewed--no concerns

## 2017-12-03 NOTE — Assessment & Plan Note (Signed)
Has remains abstinent On the bupropion

## 2017-12-03 NOTE — Progress Notes (Signed)
Subjective:    Patient ID: Billy [name], male    DOB: 1946-01-19, 72 y.o.   MRN: [id number]  HPI Here for physical and follow up of chronic health conditions  Having trouble with rash in legs Did show the dermatologist and got cream If scratches, it bleeds Helps some Discussed moisturizers also  Having trouble with his right ear Gets feeling of pressure and has to Valsalva Hearing is gone at times  Has not had alcohol since his hospitalization last September Not going to [place] Doesn't feel he has the urge  No GI bleeding Ascites seems to be better--less abdominal distension Weight down slightly  Chronic pain is stable Same hydrocodone dose wellbutrin seems to help this  Current Outpatient Medications on File Prior to Visit  Medication Sig Dispense Refill  . buPROPion (WELLBUTRIN SR) 150 MG 12 hr tablet Take 1 tablet (150 mg total) by mouth daily. 90 tablet 3  . fluocinonide cream (LIDEX) 0.05 % APPLY ON THE SKIN TWICE A DAY AS NEEDED FOR RASH  2  . furosemide (LASIX) 40 MG tablet TAKE 1 TABLET (40 MG TOTAL) BY MOUTH DAILY. 90 tablet 5  . HYDROcodone-acetaminophen (NORCO/VICODIN) 5-325 MG tablet Take 1 tablet by mouth 3 (three) times daily as needed (for pain.). 90 tablet 0  . mirtazapine (REMERON) 45 MG tablet TAKE 1 TABLET (45 MG TOTAL) BY MOUTH AT BEDTIME. 90 tablet 3  . Multiple Vitamin (MULTIVITAMIN WITH MINERALS) TABS Take 1 tablet by mouth daily with supper.     . nadolol (CORGARD) 40 MG tablet TAKE 1 TABLET (40 MG TOTAL) BY MOUTH DAILY. 90 tablet 3  . omeprazole (PRILOSEC) 40 MG capsule TAKE 1 CAPSULE BY MOUTH TWICE A DAY 180 capsule 3  . PREVIDENT 5000 ENAMEL PROTECT 1.1-5 % PSTE Place 1 application onto teeth at bedtime.  5  . psyllium (METAMUCIL) 58.6 % powder Take 1 packet by mouth at bedtime.     Marland Kitchen spironolactone (ALDACTONE) 50 MG tablet Take 3 tablets (150 mg total) by mouth daily. 180 tablet 6  . vitamin C (ASCORBIC ACID) 500 MG tablet Take 500 mg by mouth  daily as needed (for immune system support.).      No current facility-administered medications on file prior to visit.     Allergies  Allergen Reactions  . Yellow Jacket Venom Anaphylaxis, Shortness Of Breath and Other (See Comments)    Throat closes up/eye swell shut  . Nsaids Other (See Comments)    Stomach pain, Bleeding risk  . Other Other (See Comments)    Increased bleeding risk    Past Medical History:  Diagnosis Date  . Acute posthemorrhagic anemia   . Alcoholic cirrhosis of liver ([place]) 09/24/2011   Years of alcohol use. Progressed from fatty liver to cirrhosis based upon review imaging. Manifestations are portal hypertension with esophageal varices, portal gastropathy, rectal varices and he may have portal colopathy. Ascites also present. Naive to HAV and HBV Claims he has had pneumococcal vaccine   . Alcoholism ([place])   . Angiodysplasia of colon 09/11/2011   Multiple in the right colon, ablated with APC. Question if this is portal colopathy.   . Anxiety   . Asthma   . Back pain   . Basal cell carcinoma   . Bilateral varicoceles   . Campylobacter diarrhea 01/07/2012  . Cataract    removed  . Depression    mild at most  . Diverticulosis of colon   . Erectile dysfunction   . Gastric  ulcer 08/2011   small, antral ulcer  . GERD (gastroesophageal reflux disease)    takes Omeprazole prn  . GI hemorrhage 2006, 2013   esophagitis and 1+ varices, melena in 2013   . History of blood transfusion    as a child and no abnormal reaction noted  . History of bronchitis    >56yrs ago  . Hydrocele   . Hypertension    takes Nadolol daily  . Personal history of colonic polyps   . 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 Scrotal edema   . Sleep disturbance    takes Remeron nightly  . Thrombocytopenia, unspecified ([place])   . Varices, esophageal ([place]) 08/2011   Grade 2, mid-esophagus    Past Surgical History:  Procedure Laterality Date  . CATARACT  EXTRACTION  2013   bilateral with IOL  . CHOLECYSTECTOMY  12/31/2012   Procedure: LAPAROSCOPIC CHOLECYSTECTOMY;  Surgeon: Zenovia Jarred, MD;  Location: [place];  Service: General;;  . COLONOSCOPY  2001, 200411/02/2006  . COLONOSCOPY  09/11/2011   Procedure: COLONOSCOPY;  Surgeon: Gatha Mayer, MD;  Location: [facility];  Service: Endoscopy;  Laterality: N/A;  . COLONOSCOPY    . ESOPHAGEAL BANDING N/A 05/02/2017   Procedure: ESOPHAGEAL BANDING;  Surgeon: Gatha Mayer, MD;  Location: [facility];  Service: Endoscopy;  Laterality: N/A;  . ESOPHAGOGASTRODUODENOSCOPY  2006  . ESOPHAGOGASTRODUODENOSCOPY  09/11/2011   Procedure: ESOPHAGOGASTRODUODENOSCOPY (EGD);  Surgeon: Gatha Mayer, MD;  Location: [facility];  Service: Endoscopy;  Laterality: N/A;  . ESOPHAGOGASTRODUODENOSCOPY (EGD) WITH PROPOFOL N/A 04/13/2017   Procedure: ESOPHAGOGASTRODUODENOSCOPY (EGD) WITH PROPOFOL;  Surgeon: Doran Stabler, MD;  Location: [facility];  Service: Gastroenterology;  Laterality: N/A;  . ESOPHAGOGASTRODUODENOSCOPY (EGD) WITH PROPOFOL N/A 05/02/2017   Procedure: ESOPHAGOGASTRODUODENOSCOPY (EGD) WITH PROPOFOL;  Surgeon: Gatha Mayer, MD;  Location: [facility];  Service: Endoscopy;  Laterality: N/A;  . ESOPHAGOGASTRODUODENOSCOPY (EGD) WITH PROPOFOL N/A 09/30/2017   Procedure: ESOPHAGOGASTRODUODENOSCOPY (EGD) WITH PROPOFOL;  Surgeon: Gatha Mayer, MD;  Location: [facility];  Service: Endoscopy;  Laterality: N/A;  . EYE SURGERY    . FLEXIBLE SIGMOIDOSCOPY  01/07/2012   Procedure: FLEXIBLE SIGMOIDOSCOPY;  Surgeon: Lafayette Dragon, MD;  Location: [facility];  Service: Endoscopy;  Laterality: N/A;  . KNEE ARTHROSCOPY  2008   Right  . KNEE ARTHROSCOPY  2008   Left  . POLYPECTOMY    . [place] THORA/PARACENTESIS  10/28/2011  . Varicocele repair  78's    Family History  Problem Relation Age of Onset  . Stroke Mother   . Diabetes Mother   . Lung cancer Brother   . Prostate cancer Brother   .  Liver cancer Cousin   . Liver cancer Paternal Uncle   . Colon cancer Neg Hx   . Stomach cancer Neg Hx   . Esophageal cancer Neg Hx   . Colon polyps Neg Hx   . Rectal cancer Neg Hx   . Pancreatic cancer Neg Hx     Social History   Socioeconomic History  . Marital status: Married    Spouse name: Not on file  . Number of children: 1  . Years of education: Not on file  . Highest education level: Not on file  Occupational History  . Occupation: Retired    Comment: mostly from Research officer, political party Needs  . Financial resource strain: Not on file  . Food insecurity:    Worry: Not on file    Inability:  Not on file  . Transportation needs:    Medical: Not on file    Non-medical: Not on file  Tobacco Use  . Smoking status: Current Some Day Smoker    Packs/day: 0.50    Types: Cigarettes, E-cigarettes  . Smokeless tobacco: Never Used  . Tobacco comment: current using E cigarette, also  Substance and Sexual Activity  . Alcohol use: No    Alcohol/week: 0.0 oz  . Drug use: Yes    Types: Marijuana    Comment: occasionally  . Sexual activity: Never  Lifestyle  . Physical activity:    Days per week: Not on file    Minutes per session: Not on file  . Stress: Not on file  Relationships  . Social connections:    Talks on phone: Not on file    Gets together: Not on file    Attends religious service: Not on file    Active member of club or organization: Not on file    Attends meetings of clubs or organizations: Not on file    Relationship status: Not on file  . Intimate partner violence:    Fear of current or ex partner: Not on file    Emotionally abused: Not on file    Physically abused: Not on file    Forced sexual activity: Not on file  Other Topics Concern  . Not on file  Social History Narrative   Married- 2nd   1 son (adopted) from previous marriage      Has living will   Wife is  health care POA---alternate is son   Would accept attempts at resuscitation   No tube feeds  if cognitively unaware      Review of Systems  Constitutional: Negative for fatigue.       Wears seat  belt  HENT: Positive for hearing loss and tinnitus.        Just had broken tooth--getting implant, etc  Eyes: Negative for visual disturbance.       No diplopia or unilateral vision loss  Respiratory: Negative for cough, chest tightness and shortness of breath.   Cardiovascular: Negative for chest pain, palpitations and leg swelling.  Gastrointestinal: Negative for blood in stool and constipation.       No heartburn  Endocrine: Negative for polydipsia and polyuria.  Genitourinary: Negative for difficulty urinating and urgency.       No sex--no problem  Musculoskeletal: Positive for back pain. Negative for joint swelling.       Only occ pain in knees---back is the main issue  Skin: Positive for rash.  Allergic/Immunologic: Positive for environmental allergies. Negative for immunocompromised state.       Occ takes OTC med  Neurological: Negative for dizziness, syncope, light-headedness and headaches.  Hematological: Negative for adenopathy. Bruises/bleeds easily.  Psychiatric/Behavioral: Negative for dysphoric mood and sleep disturbance. The patient is not nervous/anxious.        Objective:   Physical Exam  Constitutional: He appears well-developed. No distress.  HENT:  Mouth/Throat: Oropharynx is clear and moist. No oropharyngeal exudate.  TMs retracted  Neck: No thyromegaly present.  Cardiovascular: Normal rate, regular rhythm, normal heart sounds and intact distal pulses. Exam reveals no gallop.  No murmur heard. Respiratory: Effort normal. No respiratory distress. He has no wheezes. He has no rales.  Slightly decreased breath sounds  GI: Soft. There is no tenderness.  Mild distention  Musculoskeletal: He exhibits no edema or tenderness.  Sig varicosities and blue discoloration in feet  Lymphadenopathy:    He has no cervical adenopathy.  Skin:  Scattered excoriations  on both calves  Psychiatric: He has a normal mood and affect. His behavior is normal.           Assessment & Plan:

## 2017-12-03 NOTE — Patient Instructions (Signed)
Set up with [facility] if you have ongoing ear problems

## 2017-12-03 NOTE — Assessment & Plan Note (Signed)
Recent banding of varices No evidence of hepatic failure now

## 2017-12-03 NOTE — Addendum Note (Signed)
Addended by: Viviana Simpler I on: 12/03/2017 12:52 PM   Modules accepted: Orders

## 2017-12-03 NOTE — Assessment & Plan Note (Addendum)
Doing okay Discussed continuing exercise (has health coach) Trying to decrease cigarettes/e-cigs---bupropion is helping May consider colon again in 2023 Discussed PSA--he prefers not

## 2017-12-17 ENCOUNTER — Other Ambulatory Visit: Payer: Self-pay | Admitting: Internal Medicine

## 2017-12-17 MED ORDER — HYDROCODONE-ACETAMINOPHEN 5-325 MG PO TABS: 1.0000 | ORAL_TABLET | Freq: Three times a day (TID) | ORAL | 0 refills | Status: DC | PRN

## 2017-12-17 NOTE — Telephone Encounter (Signed)
Name of Medication: hydrocodone apap Name of Pharmacy: [facility] or Written Date and Quantity: #90 on 11/19/17 Last Office Visit and Type: 12/03/17 annual exam Next Office Visit and Type: 3 month f/u scheduled 03/03/18  Last Controlled Substance Agreement Date: 08/20/17 Last UDS: 08/20/17

## 2017-12-19 ENCOUNTER — Encounter: Payer: Self-pay | Admitting: Internal Medicine

## 2017-12-30 ENCOUNTER — Telehealth: Payer: Self-pay

## 2017-12-30 ENCOUNTER — Other Ambulatory Visit: Payer: Self-pay | Admitting: Physician Assistant

## 2017-12-30 ENCOUNTER — Encounter: Payer: Self-pay | Admitting: Internal Medicine

## 2017-12-30 DIAGNOSIS — H903 Sensorineural hearing loss, bilateral: Secondary | ICD-10-CM

## 2017-12-30 DIAGNOSIS — K7030 Alcoholic cirrhosis of liver without ascites: Secondary | ICD-10-CM

## 2017-12-30 NOTE — Telephone Encounter (Signed)
-----   Message from [name], RN sent at 07/01/2017  9:52 AM EST ----- Needs [place]- see results 12/18Carlean Purl

## 2017-12-30 NOTE — Telephone Encounter (Signed)
Patient is due for [place].  He wants to call back when he has a schedule available today or tomorrow.

## 2017-12-31 NOTE — Telephone Encounter (Signed)
Patient is scheduled for [place] at [facility] on 01/12/18 9:30.  Patient notified to be NPO after midnight and arrive at radiology scheduled at 9:15

## 2018-01-12 ENCOUNTER — Ambulatory Visit (HOSPITAL_COMMUNITY)
Admission: RE | Admit: 2018-01-12 | Discharge: 2018-01-12 | Disposition: A | Discharge status: Home / Self Care | Payer: 59 | Source: Ambulatory Visit | Attending: Internal Medicine | Admitting: Internal Medicine

## 2018-01-12 DIAGNOSIS — Z9049 Acquired absence of other specified parts of digestive tract: Secondary | ICD-10-CM | POA: Diagnosis not present

## 2018-01-12 DIAGNOSIS — R188 Other ascites: Secondary | ICD-10-CM | POA: Insufficient documentation

## 2018-01-12 DIAGNOSIS — K7460 Unspecified cirrhosis of liver: Secondary | ICD-10-CM | POA: Diagnosis present

## 2018-01-12 DIAGNOSIS — K7030 Alcoholic cirrhosis of liver without ascites: Secondary | ICD-10-CM

## 2018-01-13 ENCOUNTER — Ambulatory Visit
Admission: RE | Admit: 2018-01-13 | Discharge: 2018-01-13 | Disposition: A | Discharge status: Home / Self Care | Payer: 59 | Source: Ambulatory Visit | Attending: Physician Assistant | Admitting: Physician Assistant

## 2018-01-13 ENCOUNTER — Encounter: Payer: Self-pay | Admitting: Internal Medicine

## 2018-01-13 ENCOUNTER — Other Ambulatory Visit: Payer: Self-pay | Admitting: Internal Medicine

## 2018-01-13 DIAGNOSIS — I6782 Cerebral ischemia: Secondary | ICD-10-CM | POA: Insufficient documentation

## 2018-01-13 DIAGNOSIS — H903 Sensorineural hearing loss, bilateral: Secondary | ICD-10-CM

## 2018-01-13 LAB — POCT I-STAT CREATININE: Creatinine, Ser: 1.1 mg/dL (ref 0.61–1.24)

## 2018-01-13 MED ORDER — HYDROCODONE-ACETAMINOPHEN 5-325 MG PO TABS: 1.0000 | ORAL_TABLET | Freq: Three times a day (TID) | ORAL | 0 refills | Status: DC | PRN

## 2018-01-13 MED ORDER — GADOBENATE DIMEGLUMINE 529 MG/ML IV SOLN
15.0000 mL | Freq: Once | INTRAVENOUS | Status: AC | PRN
  Administered 2018-01-13: 15 mL via INTRAVENOUS

## 2018-01-13 NOTE — Telephone Encounter (Signed)
Name of Medication: Hydrocodone Name of Pharmacy: [facility] or Written Date and Quantity: 12-17-17 #90 Last Office Visit and Type: CPE 12-03-17 Next Office Visit and Type: # Mo f/u 03-03-18 Last Controlled Substance Agreement Date: 08-20-17 Last UDS: 08-20-17  Patient comment: I won't need this till later in the week but would like to pick it up before 4th holiday weekend. Thanks

## 2018-01-16 NOTE — Progress Notes (Signed)
[place] ok Repeat 6 mos - need reminder I am sending a My Chart message to patient asking him to make a September appt

## 2018-01-29 ENCOUNTER — Telehealth: Payer: Self-pay | Admitting: Internal Medicine

## 2018-01-29 DIAGNOSIS — Z0000 Encounter for general adult medical examination without abnormal findings: Secondary | ICD-10-CM

## 2018-01-29 NOTE — Telephone Encounter (Signed)
Patient dropped off biometric form for work.  Patient said form can be faxed to [facility]. Fax number [phone]. Form is in rx tower.

## 2018-01-29 NOTE — Telephone Encounter (Signed)
1. Requires the patient's signature 2. At his recent CPE 12-03-17, a lipid panel was not done and I could not find one in the chart or in [facility]. A glucose was done 08-11-17.  Spoke to pt. He said there was no rush on it. I will get with Dr Silvio Pate to place orders for labs. When he comes for labs, I can get the pt to sign the form.

## 2018-01-30 NOTE — Telephone Encounter (Signed)
Lipid Panel order placed. Dr Silvio Pate has signed the form.

## 2018-01-30 NOTE — Addendum Note (Signed)
Addended by: Pilar Grammes on: 01/30/2018 07:53 AM   Modules accepted: Orders

## 2018-02-05 NOTE — Telephone Encounter (Signed)
Spoke to pt. He will call and schedule a lab visit.  The form is ina blue folder under the shred in a folder labeled Patient Forms on Hold. When he comes in for labs, he will need to sign it.

## 2018-02-09 ENCOUNTER — Other Ambulatory Visit (INDEPENDENT_AMBULATORY_CARE_PROVIDER_SITE_OTHER): Payer: 59

## 2018-02-09 DIAGNOSIS — Z0000 Encounter for general adult medical examination without abnormal findings: Secondary | ICD-10-CM | POA: Diagnosis not present

## 2018-02-09 LAB — LIPID PANEL
CHOL/HDL RATIO: 4
Cholesterol: 152 mg/dL (ref 0–200)
HDL: 39.1 mg/dL (ref >39.00)
LDL CALC: 89 mg/dL (ref 0–99)
NONHDL: 112.7
Triglycerides: 120 mg/dL (ref 0.0–149.0)
VLDL: 24 mg/dL (ref 0.0–40.0)

## 2018-02-10 ENCOUNTER — Other Ambulatory Visit: Payer: Self-pay | Admitting: Internal Medicine

## 2018-02-10 MED ORDER — HYDROCODONE-ACETAMINOPHEN 5-325 MG PO TABS: 1.0000 | ORAL_TABLET | Freq: Three times a day (TID) | ORAL | 0 refills | Status: DC | PRN

## 2018-02-10 NOTE — Telephone Encounter (Signed)
Name of Medication: Hydrocodone Name of Pharmacy: [facility] or Written Date and Quantity: 01-13-18 #90  Last Office Visit and Type: 3 mo f/u 12-03-17 Next Office Visit and Type: 3 mo f/u 03-03-18 Last Controlled Substance Agreement Date: 08-20-17 Last UDS: 08-20-17

## 2018-03-03 ENCOUNTER — Encounter: Payer: Self-pay | Admitting: Internal Medicine

## 2018-03-03 ENCOUNTER — Ambulatory Visit (INDEPENDENT_AMBULATORY_CARE_PROVIDER_SITE_OTHER): Payer: 59 | Admitting: Internal Medicine

## 2018-03-03 VITALS — BP 128/70 | HR 58 | Temp 98.3°F | Ht 74.5 in | Wt 171.0 lb

## 2018-03-03 DIAGNOSIS — G8929 Other chronic pain: Secondary | ICD-10-CM

## 2018-03-03 DIAGNOSIS — F1120 Opioid dependence, uncomplicated: Secondary | ICD-10-CM

## 2018-03-03 DIAGNOSIS — M549 Dorsalgia, unspecified: Secondary | ICD-10-CM | POA: Diagnosis not present

## 2018-03-03 NOTE — Progress Notes (Signed)
Subjective:    Patient ID: Billy [name], male    DOB: 26-Jan-1946, 72 y.o.   MRN: [id number]  HPI Here for monitoring of chronic pain and narcotic dependence  Has noticed some increase back pain in bed Saving 1/2 hydrocodone for night--in case he wakes up in pain Tries to wait till late in the day before starting meds Walking every day Golf at least once a week  Current Outpatient Medications on File Prior to Visit  Medication Sig Dispense Refill  . buPROPion (WELLBUTRIN SR) 150 MG 12 hr tablet Take 1 tablet (150 mg total) by mouth daily. 90 tablet 3  . fluocinonide cream (LIDEX) 0.05 % APPLY ON THE SKIN TWICE A DAY AS NEEDED FOR RASH 60 g 2  . furosemide (LASIX) 40 MG tablet TAKE 1 TABLET (40 MG TOTAL) BY MOUTH DAILY. 90 tablet 5  . HYDROcodone-acetaminophen (NORCO/VICODIN) 5-325 MG tablet Take 1 tablet by mouth 3 (three) times daily as needed (for pain.). 90 tablet 0  . mirtazapine (REMERON) 45 MG tablet TAKE 1 TABLET (45 MG TOTAL) BY MOUTH AT BEDTIME. 90 tablet 3  . Multiple Vitamin (MULTIVITAMIN WITH MINERALS) TABS Take 1 tablet by mouth daily with supper.     . nadolol (CORGARD) 40 MG tablet TAKE 1 TABLET (40 MG TOTAL) BY MOUTH DAILY. 90 tablet 3  . omeprazole (PRILOSEC) 40 MG capsule TAKE 1 CAPSULE BY MOUTH TWICE A DAY 180 capsule 3  . PREVIDENT 5000 ENAMEL PROTECT 1.1-5 % PSTE Place 1 application onto teeth at bedtime.  5  . psyllium (METAMUCIL) 58.6 % powder Take 1 packet by mouth at bedtime.     Marland Kitchen spironolactone (ALDACTONE) 50 MG tablet Take 3 tablets (150 mg total) by mouth daily. 180 tablet 6  . vitamin C (ASCORBIC ACID) 500 MG tablet Take 500 mg by mouth daily as needed (for immune system support.).      No current facility-administered medications on file prior to visit.     Allergies  Allergen Reactions  . Yellow Jacket Venom Anaphylaxis, Shortness Of Breath and Other (See Comments)    Throat closes up/eye swell shut  . Nsaids Other (See Comments)    Stomach  pain, Bleeding risk  . Other Other (See Comments)    Increased bleeding risk    Past Medical History:  Diagnosis Date  . Acute posthemorrhagic anemia   . Alcoholic cirrhosis of liver ([place]) 09/24/2011   Years of alcohol use. Progressed from fatty liver to cirrhosis based upon review imaging. Manifestations are portal hypertension with esophageal varices, portal gastropathy, rectal varices and he may have portal colopathy. Ascites also present. Naive to HAV and HBV Claims he has had pneumococcal vaccine   . Alcoholism ([place])   . Angiodysplasia of colon 09/11/2011   Multiple in the right colon, ablated with APC. Question if this is portal colopathy.   . Anxiety   . Asthma   . Back pain   . Basal cell carcinoma   . Bilateral varicoceles   . Campylobacter diarrhea 01/07/2012  . Cataract    removed  . Depression    mild at most  . Diverticulosis of colon   . Erectile dysfunction   . Gastric ulcer 08/2011   small, antral ulcer  . GERD (gastroesophageal reflux disease)    takes Omeprazole prn  . GI hemorrhage 2006, 2013   esophagitis and 1+ varices, melena in 2013   . History of blood transfusion    as a child and no abnormal  reaction noted  . History of bronchitis    >28yrs ago  . Hydrocele   . Hypertension    takes Nadolol daily  . Personal history of colonic polyps   . 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 Scrotal edema   . Sleep disturbance    takes Remeron nightly  . Thrombocytopenia, unspecified ([place])   . 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 CATARACT EXTRACTION  2013   bilateral with IOL  . CHOLECYSTECTOMY  12/31/2012   Procedure: LAPAROSCOPIC CHOLECYSTECTOMY;  Surgeon: Zenovia Jarred, MD;  Location: [facility];  Service: General;;  . COLONOSCOPY  2001, 200411/02/2006  . COLONOSCOPY  09/11/2011   Procedure: COLONOSCOPY;  Surgeon: Gatha Mayer, MD;  Location: [facility];  Service: Endoscopy;  Laterality: N/A;  . COLONOSCOPY    . ESOPHAGEAL BANDING N/A 05/02/2017   Procedure: ESOPHAGEAL BANDING;  Surgeon: Gatha Mayer, MD;  Location: [facility];  Service: Endoscopy;  Laterality: N/A;  . ESOPHAGOGASTRODUODENOSCOPY  2006  . ESOPHAGOGASTRODUODENOSCOPY  09/11/2011   Procedure: ESOPHAGOGASTRODUODENOSCOPY (EGD);  Surgeon: Gatha Mayer, MD;  [facility];  Service: Endoscopy;  Laterality: N/A;  . ESOPHAGOGASTRODUODENOSCOPY (EGD) WITH PROPOFOL N/A 04/13/2017   Procedure: ESOPHAGOGASTRODUODENOSCOPY (EGD) WITH PROPOFOL;  Surgeon: Doran Stabler, MD;  [facility];  Service: Gastroenterology;  Laterality: N/A;  . ESOPHAGOGASTRODUODENOSCOPY (EGD) WITH PROPOFOL N/A 05/02/2017   Procedure: ESOPHAGOGASTRODUODENOSCOPY (EGD) WITH PROPOFOL;  Surgeon: Gatha Mayer, MD;  [facility];  Service: Endoscopy;  Laterality: N/A;  . ESOPHAGOGASTRODUODENOSCOPY (EGD) WITH PROPOFOL N/A 09/30/2017   Procedure: ESOPHAGOGASTRODUODENOSCOPY (EGD) WITH PROPOFOL;  Surgeon: Gatha Mayer, MD;  [facility];  Service: Endoscopy;  Laterality: N/A;  . EYE SURGERY    . FLEXIBLE SIGMOIDOSCOPY  01/07/2012   Procedure: FLEXIBLE SIGMOIDOSCOPY;  Surgeon: Lafayette Dragon, MD;  [facility];  Service: Endoscopy;  Laterality: N/A;  . KNEE ARTHROSCOPY  2008   Right  . KNEE ARTHROSCOPY  2008   Left  . POLYPECTOMY    . [place] THORA/PARACENTESIS  10/28/2011  . Varicocele repair  41's    Family History  Problem Relation Age of Onset  . Stroke Mother   . Diabetes Mother   . Lung cancer Brother   . Prostate cancer Brother   . Liver cancer Cousin   . Liver cancer Paternal Uncle   . Colon cancer Neg Hx   . Stomach cancer Neg Hx   . Esophageal cancer Neg Hx   . Colon polyps Neg Hx   . Rectal cancer Neg Hx   . Pancreatic cancer Neg Hx     Social History   Socioeconomic History  . Marital status: Married    Spouse name: Not on file    . Number of children: 1  . Years of education: Not on file  . Highest education level: Not on file  Occupational History  . Occupation: Retired    Comment: mostly from Research officer, political party Needs  . Financial resource strain: Not on file  . Food insecurity:    Worry: Not on file    Inability: Not on file  . Transportation needs:    Medical: Not on file    Non-medical: Not on file  Tobacco Use  . Smoking status: Light Tobacco Smoker    Packs/day: 0.50    Types: Cigarettes, E-cigarettes  . Smokeless tobacco: Never Used  Substance and Sexual Activity  . Alcohol use: No    Alcohol/week: 0.0 standard drinks  . Drug use: Yes    Types: Marijuana    Comment: occasionally  . Sexual activity: Never  Lifestyle  . Physical activity:    Days per week: Not on file    Minutes per session: Not on file  . Stress: Not on file  Relationships  . Social connections:    Talks on phone: Not on file    Gets together: Not on file    Attends religious service: Not on file    Active member of club or organization: Not on file    Attends meetings of clubs or organizations: Not on file    Relationship status: Not on file  . Intimate partner violence:    Fear of current or ex partner: Not on file    Emotionally abused: Not on file    Physically abused: Not on file    Forced sexual activity: Not on file  Other Topics Concern  . Not on file  Social History Narrative   Married- 2nd   1 son (adopted) from previous marriage      Has living will   Wife is  health care POA---alternate is son   Would accept attempts at resuscitation   No tube feeds if cognitively unaware      Review of Systems Did fill the diazepam--for nausea (from ENT) Only one vertigo episode recently (may have helped but they are usually short lived) Had MRI from ENT---told there were past strokes Got prednisone for a few days---cleared up leg rash (and gave him bad heartburn)    Objective:   Physical Exam  Constitutional:  He appears well-developed. No distress.  Psychiatric: He has a normal mood and affect. His behavior is normal.           Assessment & Plan:

## 2018-03-03 NOTE — Assessment & Plan Note (Signed)
CSRS reviewed No concerns 

## 2018-03-03 NOTE — Assessment & Plan Note (Signed)
Has been managing okay  Stays active

## 2018-03-09 ENCOUNTER — Other Ambulatory Visit: Payer: Self-pay

## 2018-03-09 NOTE — Telephone Encounter (Signed)
Not due until 03/13/2018

## 2018-03-10 MED ORDER — HYDROCODONE-ACETAMINOPHEN 5-325 MG PO TABS: 1.0000 | ORAL_TABLET | Freq: Three times a day (TID) | ORAL | 0 refills | Status: DC | PRN

## 2018-03-10 NOTE — Telephone Encounter (Signed)
Name of Medication: Hydrocodone Name of Pharmacy: [facility] or Written Date and Quantity: 02-10-18 #90 Last Office Visit and Type: 3 Month F/U 03-03-18 Next Office Visit and Type: 3 Month F/U 06-03-18 Last Controlled Substance Agreement Date: 08-20-17 Last UDS: 08-20-17

## 2018-04-06 ENCOUNTER — Other Ambulatory Visit: Payer: Self-pay

## 2018-04-06 NOTE — Telephone Encounter (Signed)
Name of Medication: Hydrocodone Name of Pharmacy: [facility] or Written Date and Quantity: 03-10-18 #90 Last Office Visit and Type: 3 month f/u 03-03-18 Next Office Visit and Type: 3 month f/u 06-03-18 Last Controlled Substance Agreement Date: 08-20-17 Last UDS: 08-20-17  Since the request is a few days early, this can wait for Dr Silvio Pate to return tomorrow.

## 2018-04-07 ENCOUNTER — Other Ambulatory Visit (INDEPENDENT_AMBULATORY_CARE_PROVIDER_SITE_OTHER): Payer: 59

## 2018-04-07 ENCOUNTER — Encounter: Payer: Self-pay | Admitting: Internal Medicine

## 2018-04-07 ENCOUNTER — Ambulatory Visit (INDEPENDENT_AMBULATORY_CARE_PROVIDER_SITE_OTHER): Payer: 59 | Admitting: Internal Medicine

## 2018-04-07 VITALS — BP 110/60 | HR 68 | Ht 74.5 in | Wt 176.1 lb

## 2018-04-07 DIAGNOSIS — D500 Iron deficiency anemia secondary to blood loss (chronic): Secondary | ICD-10-CM

## 2018-04-07 DIAGNOSIS — I8511 Secondary esophageal varices with bleeding: Secondary | ICD-10-CM

## 2018-04-07 DIAGNOSIS — N5089 Other specified disorders of the male genital organs: Secondary | ICD-10-CM

## 2018-04-07 DIAGNOSIS — K7031 Alcoholic cirrhosis of liver with ascites: Secondary | ICD-10-CM

## 2018-04-07 LAB — CBC WITH DIFFERENTIAL/PLATELET
BASOS PCT: 0.7 % (ref 0.0–3.0)
Basophils Absolute: 0.1 10*3/uL (ref 0.0–0.1)
Eosinophils Absolute: 0.1 10*3/uL (ref 0.0–0.7)
Eosinophils Relative: 1.8 % (ref 0.0–5.0)
HCT: 40.3 % (ref 39.0–52.0)
HEMOGLOBIN: 13.7 g/dL (ref 13.0–17.0)
Lymphocytes Relative: 11.8 % — ABNORMAL LOW (ref 12.0–46.0)
Lymphs Abs: 0.8 10*3/uL (ref 0.7–4.0)
MCHC: 33.9 g/dL (ref 30.0–36.0)
MCV: 101.7 fl — AB (ref 78.0–100.0)
MONOS PCT: 11.6 % (ref 3.0–12.0)
Monocytes Absolute: 0.8 10*3/uL (ref 0.1–1.0)
Neutro Abs: 5.3 10*3/uL (ref 1.4–7.7)
Neutrophils Relative %: 74.1 % (ref 43.0–77.0)
Platelets: 148 10*3/uL — ABNORMAL LOW (ref 150.0–400.0)
RBC: 3.97 Mil/uL — AB (ref 4.22–5.81)
RDW: 13.5 % (ref 11.5–15.5)
WBC: 7.1 10*3/uL (ref 4.0–10.5)

## 2018-04-07 LAB — FERRITIN: FERRITIN: 132.2 ng/mL (ref 22.0–322.0)

## 2018-04-07 LAB — PROTIME-INR
INR: 1.2 ratio — ABNORMAL HIGH (ref 0.8–1.0)
PROTHROMBIN TIME: 13.5 s — AB (ref 9.6–13.1)

## 2018-04-07 LAB — COMPREHENSIVE METABOLIC PANEL
ALBUMIN: 3.4 g/dL — AB (ref 3.5–5.2)
ALK PHOS: 69 U/L (ref 39–117)
ALT: 12 U/L (ref 0–53)
AST: 25 U/L (ref 0–37)
BUN: 14 mg/dL (ref 6–23)
CHLORIDE: 99 meq/L (ref 96–112)
CO2: 32 mEq/L (ref 19–32)
Calcium: 9.6 mg/dL (ref 8.4–10.5)
Creatinine, Ser: 0.99 mg/dL (ref 0.40–1.50)
GFR: 79.42 mL/min (ref >60.00)
GLUCOSE: 101 mg/dL — AB (ref 70–99)
Potassium: 5.7 mEq/L — ABNORMAL HIGH (ref 3.5–5.1)
SODIUM: 135 meq/L (ref 135–145)
TOTAL PROTEIN: 7.4 g/dL (ref 6.0–8.3)
Total Bilirubin: 1.4 mg/dL — ABNORMAL HIGH (ref 0.2–1.2)

## 2018-04-07 MED ORDER — HYDROCODONE-ACETAMINOPHEN 5-325 MG PO TABS: 1.0000 | ORAL_TABLET | Freq: Three times a day (TID) | ORAL | 0 refills | Status: DC | PRN

## 2018-04-07 NOTE — Assessment & Plan Note (Signed)
Recheck CBC and iron studies

## 2018-04-07 NOTE — Patient Instructions (Signed)
You have been scheduled for an endoscopy. Please follow written instructions given to you at your visit today. If you use inhalers (even only as needed), please bring them with you on the day of your procedure.   Your provider has requested that you go to the basement level for lab work before leaving today. Press "B" on the elevator. The lab is located at the first door on the left as you exit the elevator.   We are giving you low sodium information to read and follow today.   Please get a flu vaccine to protect you for the 2019-2020 flu season.   I appreciate the opportunity to care for you. Silvano Rusk, MD, [facility]

## 2018-04-07 NOTE — Assessment & Plan Note (Addendum)
Ascites is worse Check labs and adjust diuretics 2 g Na Do [place] before end of year

## 2018-04-07 NOTE — Assessment & Plan Note (Signed)
EGD to reassess

## 2018-04-07 NOTE — Assessment & Plan Note (Signed)
Worse again Will increase diuretics pending labs

## 2018-04-07 NOTE — Progress Notes (Signed)
Billy [name] 72 y.o. [birth year] [id number]  Assessment & Plan:  Alcoholic cirrhosis of liver (H[facility]) Ascites is worse Check labs and adjust diuretics 2 g Na Do [place] before end of year    Secondary esophageal varices with bleeding (H[facility]) EGD to reassess   Scrotal edema Worse again Will increase diuretics pending labs  Iron deficiency anemia due to chronic blood loss Recheck CBC and iron studies.  I appreciate the opportunity to care for this patient. CC: [name], MD     Subjective:   Chief Complaint: Follow-up of cirrhosis bleeding varices and anemia  HPI Billy [name] is here reporting that his abdomen is swollen a bit more and so is his scrotum.  He was last seen when I did an endoscopy in the spring to follow-up bleeding varices and they looked eradicated at that time.  Since then he reports that he has had some pressure in his ears and hearing loss he saw ENT was treated with prednisone which gave him heartburn but restored his hearing loss.  He had a negative brain MRI.  He is also dealing with pruritus in the legs he scratches.  Not diffuse pruritus.  He has not had any confusion or excessive sleepiness though he has some insomnia at times.   His wife had a myocardial infarction and is recovering and back to work.  Last drink of ETOH 1 year ago  He says he could do better with low-sodium diet  He still golfing though he cannot drive the ball like he used to.   Wt Readings from Last 3 Encounters:  04/07/18 176 lb 2 oz (79.9 kg)  03/03/18 171 lb (77.6 kg)  12/03/17 175 lb (79.4 kg)    Allergies  Allergen Reactions  . Yellow Jacket Venom Anaphylaxis, Shortness Of Breath and Other (See Comments)    Throat closes up/eye swell shut  . Nsaids Other (See Comments)    Stomach pain, Bleeding risk  . Other Other (See Comments)    Increased bleeding risk   Current Meds  Medication Sig  . buPROPion (WELLBUTRIN SR) 150 MG 12 hr tablet Take 1 tablet  (150 mg total) by mouth daily.  . ferrous sulfate 325 (65 FE) MG EC tablet Take 325 mg by mouth every other day.  . fluocinonide cream (LIDEX) 0.05 % APPLY ON THE SKIN TWICE A DAY AS NEEDED FOR RASH  . furosemide (LASIX) 40 MG tablet TAKE 1 TABLET (40 MG TOTAL) BY MOUTH DAILY.  Marland Kitchen HYDROcodone-acetaminophen (NORCO/VICODIN) 5-325 MG tablet Take 1 tablet by mouth 3 (three) times daily as needed (for pain.).  Marland Kitchen mirtazapine (REMERON) 45 MG tablet TAKE 1 TABLET (45 MG TOTAL) BY MOUTH AT BEDTIME.  . Multiple Vitamin (MULTIVITAMIN WITH MINERALS) TABS Take 1 tablet by mouth daily with supper.   . nadolol (CORGARD) 40 MG tablet TAKE 1 TABLET (40 MG TOTAL) BY MOUTH DAILY.  Marland Kitchen omeprazole (PRILOSEC) 40 MG capsule TAKE 1 CAPSULE BY MOUTH TWICE A DAY  . PREVIDENT 5000 ENAMEL PROTECT 1.1-5 % PSTE Place 1 application onto teeth at bedtime.  . psyllium (METAMUCIL) 58.6 % powder Take 1 packet by mouth at bedtime.   Marland Kitchen spironolactone (ALDACTONE) 50 MG tablet Take 3 tablets (150 mg total) by mouth daily.  . vitamin C (ASCORBIC ACID) 500 MG tablet Take 500 mg by mouth daily as needed (for immune system support.).    Past Medical History:  Diagnosis Date  . Acute posthemorrhagic anemia   . Alcoholic cirrhosis of  liver ([place]) 09/24/2011   Years of alcohol use. Progressed from fatty liver to cirrhosis based upon review imaging. Manifestations are portal hypertension with esophageal varices, portal gastropathy, rectal varices and he may have portal colopathy. Ascites also present. Naive to HAV and HBV Claims he has had pneumococcal vaccine   . Alcoholism ([place])   . Angiodysplasia of colon 09/11/2011   Multiple in the right colon, ablated with APC. Question if this is portal colopathy.   . Anxiety   . Asthma   . Back pain   . Basal cell carcinoma   . Bilateral varicoceles   . Campylobacter diarrhea 01/07/2012  . Cataract    removed  . Depression    mild at most  . Diverticulosis of colon   . Erectile dysfunction   .  Gastric ulcer 08/2011   small, antral ulcer  . GERD (gastroesophageal reflux disease)    takes Omeprazole prn  . GI hemorrhage 2006, 2013   esophagitis and 1+ varices, melena in 2013   . History of blood transfusion    as a child and no abnormal reaction noted  . History of bronchitis    >35yrs ago  . Hydrocele   . Hypertension    takes Nadolol daily  . Personal history of colonic polyps   . 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 Scrotal edema   . Sleep disturbance    takes Remeron nightly  . Thrombocytopenia, unspecified ([place])   . Varices, esophageal ([place]) 08/2011   Grade 2, mid-esophagus   Past Surgical History:  Procedure Laterality Date  . CATARACT EXTRACTION  2013   bilateral with IOL  . CHOLECYSTECTOMY  12/31/2012   Procedure: LAPAROSCOPIC CHOLECYSTECTOMY;  Surgeon: Zenovia Jarred, MD;  [facility]: [facility];  Service: General;;  . COLONOSCOPY  2001, 200411/02/2006  . COLONOSCOPY  09/11/2011   Procedure: COLONOSCOPY;  Surgeon: Gatha Mayer, MD;  [facility];  Service: Endoscopy;  Laterality: N/A;  . COLONOSCOPY    . ESOPHAGEAL BANDING N/A 05/02/2017   Procedure: ESOPHAGEAL BANDING;  Surgeon: Gatha Mayer, MD;  [facility];  Service: Endoscopy;  Laterality: N/A;  . ESOPHAGOGASTRODUODENOSCOPY  2006  . ESOPHAGOGASTRODUODENOSCOPY  09/11/2011   Procedure: ESOPHAGOGASTRODUODENOSCOPY (EGD);  Surgeon: Gatha Mayer, MD;  [facility];  Service: Endoscopy;  Laterality: N/A;  . ESOPHAGOGASTRODUODENOSCOPY (EGD) WITH PROPOFOL N/A 04/13/2017   Procedure: ESOPHAGOGASTRODUODENOSCOPY (EGD) WITH PROPOFOL;  Surgeon: Doran Stabler, MD;  [facility];  Service: Gastroenterology;  Laterality: N/A;  . ESOPHAGOGASTRODUODENOSCOPY (EGD) WITH PROPOFOL N/A 05/02/2017   Procedure: ESOPHAGOGASTRODUODENOSCOPY (EGD) WITH PROPOFOL;  Surgeon: Gatha Mayer, MD;  [facility];  Service: Endoscopy;  Laterality: N/A;  .  ESOPHAGOGASTRODUODENOSCOPY (EGD) WITH PROPOFOL N/A 09/30/2017   Procedure: ESOPHAGOGASTRODUODENOSCOPY (EGD) WITH PROPOFOL;  Surgeon: Gatha Mayer, MD;  [facility];  Service: Endoscopy;  Laterality: N/A;  . EYE SURGERY    . FLEXIBLE SIGMOIDOSCOPY  01/07/2012   Procedure: FLEXIBLE SIGMOIDOSCOPY;  Surgeon: Lafayette Dragon, MD;  [facility];  Service: Endoscopy;  Laterality: N/A;  . KNEE ARTHROSCOPY  2008   Right  . KNEE ARTHROSCOPY  2008   Left  . POLYPECTOMY    . [place] THORA/PARACENTESIS  10/28/2011  . Varicocele repair  69's   Social History   Social History Narrative   Married- 2nd   1 son (adopted) from previous marriage      Has living will   Wife is  health  care POA---alternate is son   Would accept attempts at resuscitation   No tube feeds if cognitively unaware      family history includes Diabetes in his mother; Liver cancer in his cousin and paternal uncle; Lung cancer in his brother; Prostate cancer in his brother; Stroke in his mother.   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

## 2018-04-13 ENCOUNTER — Telehealth: Payer: Self-pay | Admitting: Internal Medicine

## 2018-04-13 NOTE — Telephone Encounter (Signed)
Patient notified that Dr. Carlean Purl is not in the office today, but we will call or send a message via my chart when he has reviewed.

## 2018-04-13 NOTE — Progress Notes (Signed)
My Chart note

## 2018-04-14 NOTE — Telephone Encounter (Signed)
I sent him a My Chart message - asked him if he is on a salt substitute - K is high  Think I need to increase furosemide but was checking on that first

## 2018-04-16 ENCOUNTER — Other Ambulatory Visit: Payer: Self-pay | Admitting: Internal Medicine

## 2018-04-16 DIAGNOSIS — K7031 Alcoholic cirrhosis of liver with ascites: Secondary | ICD-10-CM

## 2018-04-16 MED ORDER — FUROSEMIDE 20 MG PO TABS: 60.0000 mg | ORAL_TABLET | Freq: Every morning | ORAL | 3 refills | Status: DC

## 2018-05-04 ENCOUNTER — Other Ambulatory Visit: Payer: Self-pay

## 2018-05-04 MED ORDER — HYDROCODONE-ACETAMINOPHEN 5-325 MG PO TABS: 1.0000 | ORAL_TABLET | Freq: Three times a day (TID) | ORAL | 0 refills | Status: DC | PRN

## 2018-05-04 NOTE — Telephone Encounter (Signed)
Name of Medication: Hydrocodone Name of Pharmacy: [facility] or Written Date and Quantity: 04-07-18 #90 Last Office Visit and Type: 3 month f/u 03-03-18 Next Office Visit and Type: 3 month f/u 06-03-18 Last Controlled Substance Agreement Date: 08-20-17 Last UDS: 08-20-17

## 2018-05-28 ENCOUNTER — Other Ambulatory Visit (INDEPENDENT_AMBULATORY_CARE_PROVIDER_SITE_OTHER): Payer: 59

## 2018-05-28 ENCOUNTER — Other Ambulatory Visit: Payer: Self-pay | Admitting: Internal Medicine

## 2018-05-28 DIAGNOSIS — K7031 Alcoholic cirrhosis of liver with ascites: Secondary | ICD-10-CM | POA: Diagnosis not present

## 2018-05-28 LAB — BASIC METABOLIC PANEL
BUN: 17 mg/dL (ref 6–23)
CALCIUM: 9.4 mg/dL (ref 8.4–10.5)
CO2: 31 mEq/L (ref 19–32)
Chloride: 101 mEq/L (ref 96–112)
Creatinine, Ser: 0.96 mg/dL (ref 0.40–1.50)
GFR: 82.26 mL/min (ref >60.00)
Glucose, Bld: 115 mg/dL — ABNORMAL HIGH (ref 70–99)
Potassium: 4.6 mEq/L (ref 3.5–5.1)
SODIUM: 138 meq/L (ref 135–145)

## 2018-06-01 ENCOUNTER — Other Ambulatory Visit: Payer: Self-pay

## 2018-06-01 MED ORDER — HYDROCODONE-ACETAMINOPHEN 5-325 MG PO TABS: 1.0000 | ORAL_TABLET | Freq: Three times a day (TID) | ORAL | 0 refills | Status: DC | PRN

## 2018-06-01 NOTE — Progress Notes (Signed)
Labs normal except elevated glucose - nonfasting My Chart note

## 2018-06-01 NOTE — Telephone Encounter (Signed)
Name of Medication: Hydrocodone Name of Pharmacy: [facility] or Written Date and Quantity:  05-04-18 #90 Last Office Visit and Type: 3 Month f/u 03-03-18 Next Office Visit and Type: 3 Month f/u 06-03-18 Last Controlled Substance Agreement Date: 08-20-17 Last UDS: 08-20-17

## 2018-06-03 ENCOUNTER — Ambulatory Visit (INDEPENDENT_AMBULATORY_CARE_PROVIDER_SITE_OTHER): Payer: 59 | Admitting: Internal Medicine

## 2018-06-03 ENCOUNTER — Encounter: Payer: Self-pay | Admitting: Internal Medicine

## 2018-06-03 VITALS — BP 118/84 | HR 66 | Temp 98.0°F | Ht 75.0 in | Wt 177.0 lb

## 2018-06-03 DIAGNOSIS — F1120 Opioid dependence, uncomplicated: Secondary | ICD-10-CM | POA: Diagnosis not present

## 2018-06-03 DIAGNOSIS — Z23 Encounter for immunization: Secondary | ICD-10-CM

## 2018-06-03 DIAGNOSIS — M549 Dorsalgia, unspecified: Secondary | ICD-10-CM | POA: Diagnosis not present

## 2018-06-03 DIAGNOSIS — G8929 Other chronic pain: Secondary | ICD-10-CM | POA: Diagnosis not present

## 2018-06-03 NOTE — Assessment & Plan Note (Signed)
Satisfied with the pain regimen

## 2018-06-03 NOTE — Progress Notes (Signed)
Subjective:    Patient ID: Billy [name], male    DOB: 02-28-1946, 72 y.o.   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 buPROPion (WELLBUTRIN SR) 150 MG 12 hr tablet Take 1 tablet (150 mg total) by mouth daily. 90 tablet 3  . ferrous sulfate 325 (65 FE) MG EC tablet Take 325 mg by mouth every other day.    . fluocinonide cream (LIDEX) 0.05 % APPLY ON THE SKIN TWICE A DAY AS NEEDED FOR RASH (Patient taking differently: Apply 1 application topically 2 (two) times daily as needed (for skin rash/irritation). APPLY ON THE SKIN TWICE A DAY AS NEEDED FOR RASH) 60 g 2  . furosemide (LASIX) 20 MG tablet Take 3 tablets (60 mg total) by mouth every morning. (Patient taking differently: Take 60 mg by mouth daily. ) 270 tablet 3  . HYDROcodone-acetaminophen (NORCO/VICODIN) 5-325 MG tablet Take 1 tablet by mouth 3 (three) times daily as needed (for pain.). 90 tablet 0  . mirtazapine (REMERON) 45 MG tablet TAKE 1 TABLET (45 MG TOTAL) BY MOUTH AT BEDTIME. 90 tablet 3  . Multiple Vitamin (MULTIVITAMIN WITH MINERALS) TABS Take 1 tablet by mouth daily with supper.     . nadolol (CORGARD) 40 MG tablet TAKE 1 TABLET (40 MG TOTAL) BY MOUTH DAILY. 90 tablet 3  . omeprazole (PRILOSEC) 40 MG capsule TAKE 1 CAPSULE BY MOUTH TWICE A DAY (Patient taking differently: Take 40 mg by mouth 2 (two) times daily. ) 180 capsule 3  . PREVIDENT 5000 BOOSTER PLUS 1.1 % PSTE Place 1 application onto teeth at bedtime.  5  . psyllium (METAMUCIL) 58.6 % powder Take 1 packet by mouth at bedtime.     Marland Kitchen spironolactone (ALDACTONE) 50 MG tablet Take 3 tablets (150 mg total) by mouth  daily. 180 tablet 6  . vitamin C (ASCORBIC ACID) 250 MG tablet Take 250 mg by mouth 3 (three) times daily as needed (immune support).     No current facility-administered medications on file prior to visit.     Allergies  Allergen Reactions  . Yellow Jacket Venom Anaphylaxis, Shortness Of Breath and Other (See Comments)    Throat closes up/eye swell shut  . Nsaids Other (See Comments)    Stomach pain, Bleeding risk    Past Medical History:  Diagnosis Date  . Acute posthemorrhagic anemia   . Alcoholic cirrhosis of liver ([place]) 09/24/2011   Years of alcohol use. Progressed from fatty liver to cirrhosis based upon review imaging. Manifestations are portal hypertension with esophageal varices, portal gastropathy, rectal varices and he may have portal colopathy. Ascites also present. Naive to HAV and HBV Claims he has had pneumococcal vaccine   . Alcoholism ([place])   . Angiodysplasia of colon 09/11/2011   Multiple in the right colon, ablated with APC. Question if this is portal colopathy.   . Anxiety   . Asthma   . Back pain   . Basal cell carcinoma   . Bilateral varicoceles   . Campylobacter diarrhea 01/07/2012  . Cataract    removed  . Depression    mild at most  . Diverticulosis of  colon   . Erectile dysfunction   . Gastric ulcer 08/2011   small, antral ulcer  . GERD (gastroesophageal reflux disease)    takes Omeprazole prn  . GI hemorrhage 2006, 2013   esophagitis and 1+ varices, melena in 2013   . History of blood transfusion    as a child and no abnormal reaction noted  . History of bronchitis    >71yrs ago  . Hydrocele   . Hypertension    takes Nadolol daily  . Personal history of colonic polyps   . 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 Scrotal edema   . Sleep disturbance    takes Remeron nightly  . Thrombocytopenia, unspecified ([place])   . 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 CATARACT EXTRACTION  2013   bilateral with IOL  . CHOLECYSTECTOMY  12/31/2012   Procedure: LAPAROSCOPIC CHOLECYSTECTOMY;  Surgeon: Zenovia Jarred, MD;  Location: [facility];  Service: General;;  . COLONOSCOPY  2001, 200411/02/2006  . COLONOSCOPY  09/11/2011   Procedure: COLONOSCOPY;  Surgeon: Gatha Mayer, MD;  [facility]: [facility];  Service: Endoscopy;  Laterality: N/A;  . COLONOSCOPY    . ESOPHAGEAL BANDING N/A 05/02/2017   Procedure: ESOPHAGEAL BANDING;  Surgeon: Gatha Mayer, MD;  [facility];  Service: Endoscopy;  Laterality: N/A;  . ESOPHAGOGASTRODUODENOSCOPY  2006  . ESOPHAGOGASTRODUODENOSCOPY  09/11/2011   Procedure: ESOPHAGOGASTRODUODENOSCOPY (EGD);  Surgeon: Gatha Mayer, MD;  Location: [facility];  Service: Endoscopy;  Laterality: N/A;  . ESOPHAGOGASTRODUODENOSCOPY (EGD) WITH PROPOFOL N/A 04/13/2017   Procedure: ESOPHAGOGASTRODUODENOSCOPY (EGD) WITH PROPOFOL;  Surgeon: Doran Stabler, MD;  [facility];  Service: Gastroenterology;  Laterality: N/A;  . ESOPHAGOGASTRODUODENOSCOPY (EGD) WITH PROPOFOL N/A 05/02/2017   Procedure: ESOPHAGOGASTRODUODENOSCOPY (EGD) WITH PROPOFOL;  Surgeon: Gatha Mayer, MD;  [facility];  Service: Endoscopy;  Laterality: N/A;  . ESOPHAGOGASTRODUODENOSCOPY (EGD) WITH PROPOFOL N/A 09/30/2017   Procedure: ESOPHAGOGASTRODUODENOSCOPY (EGD) WITH PROPOFOL;  Surgeon: Gatha Mayer, MD;  [facility];  Service: Endoscopy;  Laterality: N/A;  . EYE SURGERY    . FLEXIBLE SIGMOIDOSCOPY  01/07/2012   Procedure: FLEXIBLE SIGMOIDOSCOPY;  Surgeon: Lafayette Dragon, MD;  [facility];  Service: Endoscopy;  Laterality: N/A;  . KNEE ARTHROSCOPY  2008   Right  . KNEE ARTHROSCOPY  2008   Left  . POLYPECTOMY    . [place] THORA/PARACENTESIS  10/28/2011  . Varicocele repair  58's    Family History  Problem Relation Age of Onset  . Stroke Mother   . Diabetes Mother   . Lung cancer Brother   .  Prostate cancer Brother   . Liver cancer Cousin   . Liver cancer Paternal Uncle   . Colon cancer Neg Hx   . Stomach cancer Neg Hx   . Esophageal cancer Neg Hx   . Colon polyps Neg Hx   . Rectal cancer Neg Hx   . Pancreatic cancer Neg Hx     Social History   Socioeconomic History  . Marital status: Married    Spouse name: Not on file  . Number of children: 1  . Years of education: Not on file  . Highest education level: Not on file  Occupational History  . Occupation: Retired    Comment: mostly from Research officer, political party Needs  . Financial resource strain: Not on file  . Food insecurity:  Worry: Not on file    Inability: Not on file  . Transportation needs:    Medical: Not on file    Non-medical: Not on file  Tobacco Use  . Smoking status: Light Tobacco Smoker    Packs/day: 0.50    Types: Cigarettes, E-cigarettes  . Smokeless tobacco: Never Used  Substance and Sexual Activity  . Alcohol use: No    Alcohol/week: 0.0 standard drinks  . Drug use: Yes    Types: Marijuana    Comment: occasionally  . Sexual activity: Never  Lifestyle  . Physical activity:    Days per week: Not on file    Minutes per session: Not on file  . Stress: Not on file  Relationships  . Social connections:    Talks on phone: Not on file    Gets together: Not on file    Attends religious service: Not on file    Active member of club or organization: Not on file    Attends meetings of clubs or organizations: Not on file    Relationship status: Not on file  . Intimate partner violence:    Fear of current or ex partner: Not on file    Emotionally abused: Not on file    Physically abused: Not on file    Forced sexual activity: Not on file  Other Topics Concern  . Not on file  Social History Narrative   Married- 2nd   1 son (adopted) from previous marriage      Has living will   Wife is  health care POA---alternate is son   Would accept attempts at resuscitation   No tube feeds if cognitively  unaware      Review of Systems Diuretic dose increased by GI--helped the ear Has EGD next week---routine check of varices    Objective:   Physical Exam  Constitutional: No distress.  Psychiatric: He has a normal mood and affect. His behavior is normal.           Assessment & Plan:

## 2018-06-03 NOTE — Assessment & Plan Note (Signed)
CSRS reviewed  No problems

## 2018-06-03 NOTE — Addendum Note (Signed)
Addended by: Pilar Grammes on: 06/03/2018 04:23 PM   Modules accepted: Orders

## 2018-06-08 ENCOUNTER — Encounter (HOSPITAL_COMMUNITY): Payer: Self-pay | Admitting: Emergency Medicine

## 2018-06-08 ENCOUNTER — Other Ambulatory Visit: Payer: Self-pay

## 2018-06-09 ENCOUNTER — Encounter (HOSPITAL_COMMUNITY): Payer: Self-pay | Admitting: Anesthesiology

## 2018-06-09 ENCOUNTER — Other Ambulatory Visit: Payer: Self-pay | Admitting: Internal Medicine

## 2018-06-09 ENCOUNTER — Ambulatory Visit (HOSPITAL_COMMUNITY): Payer: 59 | Admitting: Anesthesiology

## 2018-06-09 ENCOUNTER — Encounter (HOSPITAL_COMMUNITY)
Admission: RE | Disposition: A | Discharge status: Home / Self Care | Payer: Self-pay | Source: Ambulatory Visit | Attending: Internal Medicine

## 2018-06-09 ENCOUNTER — Ambulatory Visit (HOSPITAL_COMMUNITY)
Admission: RE | Admit: 2018-06-09 | Discharge: 2018-06-09 | Disposition: A | Discharge status: Home / Self Care | Payer: 59 | Source: Ambulatory Visit | Attending: Internal Medicine | Admitting: Internal Medicine

## 2018-06-09 DIAGNOSIS — J45909 Unspecified asthma, uncomplicated: Secondary | ICD-10-CM | POA: Insufficient documentation

## 2018-06-09 DIAGNOSIS — K7031 Alcoholic cirrhosis of liver with ascites: Secondary | ICD-10-CM | POA: Diagnosis not present

## 2018-06-09 DIAGNOSIS — R1310 Dysphagia, unspecified: Secondary | ICD-10-CM | POA: Insufficient documentation

## 2018-06-09 DIAGNOSIS — Z85828 Personal history of other malignant neoplasm of skin: Secondary | ICD-10-CM | POA: Insufficient documentation

## 2018-06-09 DIAGNOSIS — K317 Polyp of stomach and duodenum: Secondary | ICD-10-CM | POA: Diagnosis not present

## 2018-06-09 DIAGNOSIS — I10 Essential (primary) hypertension: Secondary | ICD-10-CM | POA: Insufficient documentation

## 2018-06-09 DIAGNOSIS — K766 Portal hypertension: Secondary | ICD-10-CM | POA: Diagnosis not present

## 2018-06-09 DIAGNOSIS — K3189 Other diseases of stomach and duodenum: Secondary | ICD-10-CM | POA: Insufficient documentation

## 2018-06-09 DIAGNOSIS — F329 Major depressive disorder, single episode, unspecified: Secondary | ICD-10-CM | POA: Diagnosis not present

## 2018-06-09 DIAGNOSIS — I8511 Secondary esophageal varices with bleeding: Secondary | ICD-10-CM | POA: Diagnosis not present

## 2018-06-09 DIAGNOSIS — Z79899 Other long term (current) drug therapy: Secondary | ICD-10-CM | POA: Diagnosis not present

## 2018-06-09 DIAGNOSIS — G479 Sleep disorder, unspecified: Secondary | ICD-10-CM | POA: Diagnosis not present

## 2018-06-09 DIAGNOSIS — F419 Anxiety disorder, unspecified: Secondary | ICD-10-CM | POA: Diagnosis not present

## 2018-06-09 DIAGNOSIS — R188 Other ascites: Secondary | ICD-10-CM

## 2018-06-09 DIAGNOSIS — K219 Gastro-esophageal reflux disease without esophagitis: Secondary | ICD-10-CM | POA: Insufficient documentation

## 2018-06-09 DIAGNOSIS — I8510 Secondary esophageal varices without bleeding: Secondary | ICD-10-CM | POA: Diagnosis present

## 2018-06-09 HISTORY — PX: ESOPHAGOGASTRODUODENOSCOPY (EGD) WITH PROPOFOL: SHX5813

## 2018-06-09 HISTORY — PX: ESOPHAGEAL BANDING: SHX5518

## 2018-06-09 SURGERY — ESOPHAGOGASTRODUODENOSCOPY (EGD) WITH PROPOFOL
Anesthesia: Monitor Anesthesia Care

## 2018-06-09 MED ORDER — PROPOFOL 500 MG/50ML IV EMUL
INTRAVENOUS | Status: DC | PRN
  Administered 2018-06-09: 150 ug/kg/min via INTRAVENOUS

## 2018-06-09 MED ORDER — PROPOFOL 10 MG/ML IV BOLUS
INTRAVENOUS | Status: AC
  Filled 2018-06-09: qty 40

## 2018-06-09 MED ORDER — ONDANSETRON HCL 4 MG/2ML IJ SOLN
INTRAMUSCULAR | Status: DC | PRN
  Administered 2018-06-09: 4 mg via INTRAVENOUS

## 2018-06-09 MED ORDER — PROPOFOL 10 MG/ML IV BOLUS
INTRAVENOUS | Status: DC | PRN
  Administered 2018-06-09: 20 mg via INTRAVENOUS
  Administered 2018-06-09: 10 mg via INTRAVENOUS

## 2018-06-09 MED ORDER — LIDOCAINE 2% (20 MG/ML) 5 ML SYRINGE
INTRAMUSCULAR | Status: DC | PRN
  Administered 2018-06-09: 60 mg via INTRAVENOUS

## 2018-06-09 MED ORDER — SPIRONOLACTONE 50 MG PO TABS: 200.0000 mg | ORAL_TABLET | Freq: Every day | ORAL | 1 refills | Status: DC

## 2018-06-09 MED ORDER — LACTATED RINGERS IV SOLN
INTRAVENOUS | Status: DC | PRN
  Administered 2018-06-09: 09:00:00 via INTRAVENOUS

## 2018-06-09 SURGICAL SUPPLY — 15 items

## 2018-06-09 NOTE — Anesthesia Postprocedure Evaluation (Signed)
Anesthesia Post Note  Patient: Billy [name]  Procedure(s) Performed: ESOPHAGOGASTRODUODENOSCOPY (EGD) WITH PROPOFOL (N/A ) ESOPHAGEAL BANDING     Patient location during evaluation: PACU Anesthesia Type: MAC Level of consciousness: awake and alert Pain management: pain level controlled Vital Signs Assessment: post-procedure vital signs reviewed and stable Respiratory status: spontaneous breathing, nonlabored ventilation, respiratory function stable and patient connected to nasal cannula oxygen Cardiovascular status: stable and blood pressure returned to baseline Postop Assessment: no apparent nausea or vomiting Anesthetic complications: no    Last Vitals:  Vitals:   06/09/18 1000 06/09/18 1010  BP: 132/68 (!) 133/119  Pulse: 65 63  Resp: (!) 23 14  Temp:    SpO2: 100% 99%    Last Pain:  Vitals:   06/09/18 1010  TempSrc:   PainSc: 0-No pain                 Effie Berkshire

## 2018-06-09 NOTE — Anesthesia Preprocedure Evaluation (Addendum)
Anesthesia Evaluation  Patient identified by MRN, date of birth, ID band Patient awake    Reviewed: Allergy & Precautions, NPO status , Patient's Chart, lab work & pertinent test results  Airway Mallampati: I  TM Distance: >3 FB Neck ROM: Full    Dental  (+) Teeth Intact, Dental Advisory Given   Pulmonary Current Smoker,    breath sounds clear to auscultation       Cardiovascular hypertension,  Rhythm:Regular Rate:Normal     Neuro/Psych Anxiety Depression negative neurological ROS     GI/Hepatic PUD, GERD  Medicated,(+) Cirrhosis   Esophageal Varices    , Hepatitis -  Endo/Other  negative endocrine ROS  Renal/GU negative Renal ROS     Musculoskeletal  (+) Arthritis ,   Abdominal Normal abdominal exam  (+)   Peds  Hematology   Anesthesia Other Findings   Reproductive/Obstetrics                            Anesthesia Physical Anesthesia Plan  ASA: III  Anesthesia Plan: MAC   Post-op Pain Management:    Induction: Intravenous  PONV Risk Score and Plan: Propofol infusion  Airway Management Planned: Natural Airway and Nasal Cannula  Additional Equipment: None  Intra-op Plan:   Post-operative Plan:   Informed Consent: I have reviewed the patients History and Physical, chart, labs and discussed the procedure including the risks, benefits and alternatives for the proposed anesthesia with the patient or authorized representative who has indicated his/her understanding and acceptance.     Plan Discussed with: CRNA  Anesthesia Plan Comments:        Anesthesia Quick Evaluation

## 2018-06-09 NOTE — H&P (Signed)
[facility] History and Physical   Primary Care Physician:  [name], MD   Reason for Procedure:   f/u varices, also having dysphagia  Plan:    EGD, possible variceal banding, dilation of esophagus  HPI: Billy [name] is a 72 y.o. male w/ hx esophageal varices and bleeding and also having dysphagia. He is here for f/u of varices and tells me he is having some intermitement solid dysphagia lately.  Ascites better on higher diuretic dose. He is not compliant with 2 g Na diet   Past Medical History:  Diagnosis Date  . Acute posthemorrhagic anemia   . Alcoholic cirrhosis of liver ([place]) 09/24/2011   Years of alcohol use. Progressed from fatty liver to cirrhosis based upon review imaging. Manifestations are portal hypertension with esophageal varices, portal gastropathy, rectal varices and he may have portal colopathy. Ascites also present. Naive to HAV and HBV Claims he has had pneumococcal vaccine   . Alcoholism ([place])   . Angiodysplasia of colon 09/11/2011   Multiple in the right colon, ablated with APC. Question if this is portal colopathy.   . Anxiety   . Asthma   . Back pain   . Basal cell carcinoma   . Bilateral varicoceles   . Campylobacter diarrhea 01/07/2012  . Cataract    removed  . Depression    mild at most  . Diverticulosis of colon   . Erectile dysfunction   . Gastric ulcer 08/2011   small, antral ulcer  . GERD (gastroesophageal reflux disease)    takes Omeprazole prn  . GI hemorrhage 2006, 2013   esophagitis and 1+ varices, melena in 2013   . History of blood transfusion    as a child and no abnormal reaction noted  . History of bronchitis    >61yrs ago  . Hydrocele   . Hypertension    takes Nadolol daily  . Personal history of colonic polyps   . 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 Scrotal edema   . Sleep disturbance    takes Remeron nightly  . Thrombocytopenia, unspecified ([place])   . Varices,  esophageal ([place]) 08/2011   Grade 2, mid-esophagus    Past Surgical History:  Procedure Laterality Date  . CATARACT EXTRACTION  2013   bilateral with IOL  . CHOLECYSTECTOMY  12/31/2012   Procedure: LAPAROSCOPIC CHOLECYSTECTOMY;  Surgeon: Zenovia Jarred, MD;  Location: [facility];  Service: General;;  . COLONOSCOPY  2001, 200411/02/2006  . COLONOSCOPY  09/11/2011   Procedure: COLONOSCOPY;  Surgeon: Gatha Mayer, MD;  [facility];  Service: Endoscopy;  Laterality: N/A;  . COLONOSCOPY    . ESOPHAGEAL BANDING N/A 05/02/2017   Procedure: ESOPHAGEAL BANDING;  Surgeon: Gatha Mayer, MD;  [facility];  Service: Endoscopy;  Laterality: N/A;  . ESOPHAGOGASTRODUODENOSCOPY  2006  . ESOPHAGOGASTRODUODENOSCOPY  09/11/2011   Procedure: ESOPHAGOGASTRODUODENOSCOPY (EGD);  Surgeon: Gatha Mayer, MD;  Location: [facility];  Service: Endoscopy;  Laterality: N/A;  . ESOPHAGOGASTRODUODENOSCOPY (EGD) WITH PROPOFOL N/A 04/13/2017   Procedure: ESOPHAGOGASTRODUODENOSCOPY (EGD) WITH PROPOFOL;  Surgeon: Doran Stabler, MD;  [facility];  Service: Gastroenterology;  Laterality: N/A;  . ESOPHAGOGASTRODUODENOSCOPY (EGD) WITH PROPOFOL N/A 05/02/2017   Procedure: ESOPHAGOGASTRODUODENOSCOPY (EGD) WITH PROPOFOL;  Surgeon: Gatha Mayer, MD;  [facility];  Service: Endoscopy;  Laterality: N/A;  . ESOPHAGOGASTRODUODENOSCOPY (EGD) WITH PROPOFOL N/A 09/30/2017   Procedure: ESOPHAGOGASTRODUODENOSCOPY (EGD) WITH PROPOFOL;  Surgeon: Gatha Mayer, MD;  [facility];  Service: Endoscopy;  Laterality: N/A;  . EYE SURGERY    . FLEXIBLE SIGMOIDOSCOPY  01/07/2012   Procedure: FLEXIBLE SIGMOIDOSCOPY;  Surgeon: Lafayette Dragon, MD;  [facility];  Service: Endoscopy;  Laterality: N/A;  . KNEE ARTHROSCOPY  2008   Right  . KNEE ARTHROSCOPY  2008   Left  . POLYPECTOMY    . [place] THORA/PARACENTESIS  10/28/2011  . Varicocele repair  1980's    Prior to Admission  medications   Medication Sig Start Date End Date Taking? Authorizing Provider  buPROPion (WELLBUTRIN SR) 150 MG 12 hr tablet Take 1 tablet (150 mg total) by mouth daily. 06/27/17  Yes Viviana Simpler I, MD  ferrous sulfate 325 (65 FE) MG EC tablet Take 325 mg by mouth every other day.   Yes [provider]  fluocinonide cream (LIDEX) 0.05 % APPLY ON THE SKIN TWICE A DAY AS NEEDED FOR RASH Patient taking differently: Apply 1 application topically 2 (two) times daily as needed (for skin rash/irritation). APPLY ON THE SKIN TWICE A DAY AS NEEDED FOR RASH 05/28/18  Yes [name], MD  furosemide (LASIX) 20 MG tablet Take 3 tablets (60 mg total) by mouth every morning. Patient taking differently: Take 60 mg by mouth daily.  04/16/18  Yes Gatha Mayer, MD  HYDROcodone-acetaminophen (NORCO/VICODIN) 5-325 MG tablet Take 1 tablet by mouth 3 (three) times daily as needed (for pain.). 06/01/18  Yes [name], MD  mirtazapine (REMERON) 45 MG tablet TAKE 1 TABLET (45 MG TOTAL) BY MOUTH AT BEDTIME. 07/03/17  Yes [name], MD  Multiple Vitamin (MULTIVITAMIN WITH MINERALS) TABS Take 1 tablet by mouth daily with supper.    Yes [provider]  nadolol (CORGARD) 40 MG tablet TAKE 1 TABLET (40 MG TOTAL) BY MOUTH DAILY. 10/13/17  Yes Gatha Mayer, MD  omeprazole (PRILOSEC) 40 MG capsule TAKE 1 CAPSULE BY MOUTH TWICE A DAY Patient taking differently: Take 40 mg by mouth 2 (two) times daily.  10/08/17  Yes [name], MD  PREVIDENT 5000 BOOSTER PLUS 1.1 % PSTE Place 1 application onto teeth at bedtime. 03/30/18  Yes [provider]  psyllium (METAMUCIL) 58.6 % powder Take 1 packet by mouth at bedtime.    Yes [provider]  spironolactone (ALDACTONE) 50 MG tablet Take 3 tablets (150 mg total) by mouth daily. 07/01/17  Yes Gatha Mayer, MD  vitamin C (ASCORBIC ACID) 250 MG tablet Take 250 mg by mouth 3 (three) times daily as needed (immune support).    Yes [provider]    No current facility-administered medications for this encounter.     Allergies as of 04/07/2018 - Review Complete 04/07/2018  Allergen Reaction Noted  . Yellow jacket venom Anaphylaxis, Shortness Of Breath, and Other (See Comments) 04/28/2017  . Nsaids Other (See Comments) 12/21/2012  . Other Other (See Comments) 12/23/2012    Family History  Problem Relation Age of Onset  . Stroke Mother   . Diabetes Mother   . Lung cancer Brother   . Prostate cancer Brother   . Liver cancer Cousin   . Liver cancer Paternal Uncle   . Colon cancer Neg Hx   . Stomach cancer Neg Hx   . Esophageal cancer Neg Hx   . Colon polyps Neg Hx   . Rectal cancer Neg Hx   . Pancreatic cancer Neg Hx     Social History   Social History Narrative  Married- 2nd   1 son (adopted) from previous marriage      Has living will   Wife is  health care POA---alternate is son   Would accept attempts at resuscitation   No tube feeds if cognitively unaware        Review of Systems: Positive for ascites and scrotal edema All other review of systems negative except as mentioned in the HPI.  Physical Exam: Vital signs in last 24 hours: Temp:  [98.3 F (36.8 C)] 98.3 F (36.8 C) (11/26 0830) Pulse Rate:  [57] 57 (11/26 0830) Resp:  [13] 13 (11/26 0830) BP: (125)/(63) 125/63 (11/26 0830) SpO2:  [98 %] 98 % (11/26 0830) Weight:  [80.4 kg] 80.4 kg (11/26 0830)   General:   Alert,  Well-developed, well-nourished, pleasant and cooperative in NAD Lungs:  Clear throughout to auscultation.   Heart:  Regular rate and rhythm; no murmurs, clicks, rubs,  or gallops. Abdomen:  Soft, nontender and  Moderately distended with ascites. Normal bowel sounds.   Neuro/Psych:  Alert and cooperative. Normal mood and affect. A and O x 3   @Velina Drollinger  Simonne Maffucci, MD, [facility] [phone] (pager) 06/09/2018 9:12 AM@

## 2018-06-09 NOTE — Discharge Instructions (Addendum)
° ° °  It is important that you limit sodium intake to 2000 mg daily. Less would be even better. Please see the attached diet.  I have increased spironolactone to 200 mg in AM also.  Please go to my office week of 12/9 and do labs.   I banded varices x 3 today.  There were some polyps vs varices in the small intestine and these need further evaluation. I will need one of my partners to do that with endoscopic ultrasound. Will arrange that - it is not urgent but should be done in next few months I think. Will let you know.  I did not see anything that would cause swallowing problems but that can hide sometimes. Please chew well and eat slower.  I appreciate the opportunity to care for you. Gatha Mayer, MD, FACG   YOU HAD AN ENDOSCOPIC PROCEDURE TODAY: Refer to the procedure report and other information in the discharge instructions given to you for any specific questions about what was found during the examination. If this information does not answer your questions, please call Dr. Celesta Aver office at [phone] to clarify.   YOU SHOULD EXPECT: Some feelings of bloating in the abdomen. Passage of more gas than usual. Walking can help get rid of the air that was put into your GI tract during the procedure and reduce the bloating. If you had a lower endoscopy (such as a colonoscopy or flexible sigmoidoscopy) you may notice spotting of blood in your stool or on the toilet paper. Some abdominal soreness may be present for a day or two, also.  DIET: Your first meal following the procedure should be a light meal and then it is ok to progress to your normal diet. A half-sandwich or bowl of soup is an example of a good first meal. Heavy or fried foods are harder to digest and may make you feel nauseous or bloated. Drink plenty of fluids but you should avoid alcoholic beverages for 24 hours.   ACTIVITY: Your care partner should take you home directly after the procedure. You should plan to  take it easy, moving slowly for the rest of the day. You can resume normal activity the day after the procedure however YOU SHOULD NOT DRIVE, use power tools, machinery or perform tasks that involve climbing or major physical exertion for 24 hours (because of the sedation medicines used during the test).   SYMPTOMS TO REPORT IMMEDIATELY: A gastroenterologist can be reached at any hour. Please call [phone]  for any of the following symptoms:   Following upper endoscopy (EGD, EUS, ERCP, esophageal dilation) Vomiting of blood or coffee ground material  New, significant abdominal pain  New, significant chest pain or pain under the shoulder blades  Painful or persistently difficult swallowing  New shortness of breath  Black, tarry-looking or red, bloody stools

## 2018-06-09 NOTE — Op Note (Addendum)
[facility] Patient Name: Billy [name] Procedure Date: 06/09/2018 MRN: [id number] Attending MD: Gatha Mayer , MD Date of Birth: May 18, 1946 CSN: [id number] Age: 72 Admit Type: Outpatient Procedure:                Upper GI endoscopy Indications:              Dysphagia, Esophageal varices, Follow-up of                            esophageal varices, For therapy of esophageal                            varices Providers:                Gatha Mayer, MD, Vista Lawman, RN, Tinnie Gens,                            Technician, Virgia Land, CRNA Referring MD:              Medicines:                Propofol per Anesthesia, Monitored Anesthesia Care Complications:            No immediate complications. Estimated Blood Loss:     Estimated blood loss: none. Procedure:                Pre-Anesthesia Assessment:                           - Prior to the procedure, a History and Physical                            was performed, and patient medications and                            allergies were reviewed. The patient's tolerance of                            previous anesthesia was also reviewed. The risks                            and benefits of the procedure and the sedation                            options and risks were discussed with the patient.                            All questions were answered, and informed consent                            was obtained. Prior Anticoagulants: The patient has                            taken no previous anticoagulant or antiplatelet  agents. ASA Grade Assessment: III - A patient with                            severe systemic disease. After reviewing the risks                            and benefits, the patient was deemed in                            satisfactory condition to undergo the procedure.                           After obtaining informed consent, the endoscope was     passed under direct vision. Throughout the                            procedure, the patient's blood pressure, pulse, and                            oxygen saturations were monitored continuously. The                            [id number]-H190 ([id number]) Olympus adult endoscope was                            introduced through the mouth, and advanced to the                            second part of duodenum. The upper GI endoscopy was                            accomplished without difficulty. The patient                            tolerated the procedure well. Scope In: Scope Out: Findings:      Grade II, large (> 5 mm) varices were found in the lower third of the       esophagus. They were 6 mm in largest diameter. Some red spots. Three       bands were successfully placed with complete eradication, resulting in       deflation of varices. Estimated blood loss: none.      Moderate portal hypertensive gastropathy was found in the entire       examined stomach.      Multiple 3 to 8 mm semi-sessile polyps were found in the duodenal bulb.      The exam was otherwise without abnormality.      The cardia and gastric fundus were normal on retroflexion. 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                            Completely eradicated. Banded. x 3                           -  Portal hypertensive gastropathy.                           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                           - The examination was otherwise normal.                           - No specimens collected. Moderate Sedation:      Not Applicable - Patient had care per Anesthesia. Recommendation:           - Patient has a contact number available for                            emergencies. The signs and symptoms of potential                            delayed complications were discussed with the                            patient. Return to normal activities tomorrow.                             Written discharge instructions were provided to the                            patient.                           - Low sodium diet.                           - Continue present medications.                           - He needs EUS evaluation of duodenal lesions -                            look like polyps but could be varices? will arrange                            for that and f/u varices likely first part of 2020                           I have increased spironolactone to 200 mg AM to                            treat ascites and he needs better Na restriction                           I have ordered a BMET for him to do 12/9 in my                            office  Nothing seen to dilate - advised eat slower and                            chew well Procedure Code(s):        --- Professional ---                           [phone], Esophagogastroduodenoscopy, flexible,                            transoral; with band ligation of esophageal/gastric                            varices Diagnosis Code(s):        --- Professional ---                           I85.00, Esophageal varices without bleeding                           K76.6, Portal hypertension                           K31.89, Other diseases of stomach and duodenum                           K31.7, Polyp of stomach and duodenum                           R13.10, Dysphagia, unspecified CPT copyright 2018 American [employer] Association. All rights reserved. The codes documented in this report are preliminary and upon coder review may  be revised to meet current compliance requirements. Gatha Mayer, MD 06/09/2018 10:05:31 AM This report has been signed electronically. Number of Addenda: 0

## 2018-06-09 NOTE — Transfer of Care (Signed)
Immediate Anesthesia Transfer of Care Note  Patient: Billy [name]  Procedure(s) Performed: ESOPHAGOGASTRODUODENOSCOPY (EGD) WITH PROPOFOL (N/A ) ESOPHAGEAL BANDING  Patient Location: PACU  Anesthesia Type:MAC  Level of Consciousness: awake, alert  and oriented  Airway & Oxygen Therapy: Patient Spontanous Breathing and Patient connected to face mask oxygen  Post-op Assessment: Report given to RN and Post -op Vital signs reviewed and stable  Post vital signs: Reviewed and stable  Last Vitals:  Vitals Value Taken Time  BP    Temp    Pulse    Resp    SpO2      Last Pain:  Vitals:   06/09/18 0830  TempSrc: Oral  PainSc: 0-No pain         Complications: No apparent anesthesia complications

## 2018-06-10 ENCOUNTER — Encounter (HOSPITAL_COMMUNITY): Payer: Self-pay | Admitting: Internal Medicine

## 2018-06-12 ENCOUNTER — Other Ambulatory Visit: Payer: Self-pay | Admitting: Internal Medicine

## 2018-06-21 ENCOUNTER — Other Ambulatory Visit: Payer: Self-pay | Admitting: Internal Medicine

## 2018-06-25 ENCOUNTER — Other Ambulatory Visit (INDEPENDENT_AMBULATORY_CARE_PROVIDER_SITE_OTHER): Payer: 59

## 2018-06-25 DIAGNOSIS — R188 Other ascites: Secondary | ICD-10-CM

## 2018-06-25 LAB — BASIC METABOLIC PANEL
BUN: 18 mg/dL (ref 6–23)
CO2: 30 mEq/L (ref 19–32)
Calcium: 9.5 mg/dL (ref 8.4–10.5)
Chloride: 96 mEq/L (ref 96–112)
Creatinine, Ser: 1.14 mg/dL (ref 0.40–1.50)
GFR: 67.45 mL/min (ref >60.00)
Glucose, Bld: 182 mg/dL — ABNORMAL HIGH (ref 70–99)
Potassium: 4.7 mEq/L (ref 3.5–5.1)
Sodium: 134 mEq/L — ABNORMAL LOW (ref 135–145)

## 2018-06-27 NOTE — Progress Notes (Signed)
Labs ok except non fasting glucose 184  My Chart message

## 2018-06-29 ENCOUNTER — Other Ambulatory Visit: Payer: Self-pay

## 2018-06-29 MED ORDER — HYDROCODONE-ACETAMINOPHEN 5-325 MG PO TABS: 1.0000 | ORAL_TABLET | Freq: Three times a day (TID) | ORAL | 0 refills | Status: DC | PRN

## 2018-06-29 NOTE — Telephone Encounter (Signed)
Last office visit 06/03/2018 for Chronic back pain.  Last refilled 06/01/2018 for #90 with no refills.  Next Appt: 09/08/2018.  UDS/Contract 08/20/2017.

## 2018-07-05 ENCOUNTER — Other Ambulatory Visit (HOSPITAL_COMMUNITY): Payer: Self-pay | Admitting: Internal Medicine

## 2018-07-14 ENCOUNTER — Other Ambulatory Visit: Payer: Self-pay | Admitting: Physician Assistant

## 2018-07-14 ENCOUNTER — Ambulatory Visit
Admission: RE | Admit: 2018-07-14 | Discharge: 2018-07-14 | Disposition: A | Discharge status: Home / Self Care | Payer: 59 | Source: Ambulatory Visit | Attending: Physician Assistant | Admitting: Physician Assistant

## 2018-07-14 ENCOUNTER — Ambulatory Visit
Admission: RE | Admit: 2018-07-14 | Discharge: 2018-07-14 | Disposition: A | Discharge status: Home / Self Care | Payer: 59 | Attending: Physician Assistant | Admitting: Physician Assistant

## 2018-07-14 DIAGNOSIS — R0602 Shortness of breath: Secondary | ICD-10-CM | POA: Insufficient documentation

## 2018-07-17 ENCOUNTER — Telehealth: Payer: Self-pay | Admitting: Internal Medicine

## 2018-07-17 MED ORDER — AZITHROMYCIN 250 MG PO TABS: ORAL_TABLET | ORAL | 0 refills | Status: DC

## 2018-07-17 NOTE — Telephone Encounter (Signed)
Got faxed copy of x-ray report from 12/31---ordered by ENT (had visit there that day)---provide [name]. She got the report yesterday and sent it on to me (deferring any Rx)  It was done due to some cough and mucus No fever  Plan---- will treat with Z-pak He will let me know if he is not feeling better next week  Billy [name],  Please call him to set up follow up in 3-4 weeks

## 2018-07-27 ENCOUNTER — Other Ambulatory Visit: Payer: Self-pay | Admitting: Internal Medicine

## 2018-07-27 MED ORDER — HYDROCODONE-ACETAMINOPHEN 5-325 MG PO TABS: 1.0000 | ORAL_TABLET | Freq: Three times a day (TID) | ORAL | 0 refills | Status: DC | PRN

## 2018-07-27 NOTE — Telephone Encounter (Signed)
Name of Medication: Hydrocodone Name of Pharmacy: [facility] or Written Date and Quantity: 06-29-18 #90 Last Office Visit and Type: 06-03-18 Next Office Visit and Type: 08-11-18 Last Controlled Substance Agreement Date: 08-20-17 Last UDS: 08-20-17

## 2018-07-30 ENCOUNTER — Other Ambulatory Visit: Payer: Self-pay

## 2018-07-30 ENCOUNTER — Telehealth: Payer: Self-pay

## 2018-07-30 DIAGNOSIS — K319 Disease of stomach and duodenum, unspecified: Secondary | ICD-10-CM

## 2018-07-30 NOTE — Telephone Encounter (Signed)
08/27/18 at 1030 am EUS for Dr Ardis Hughs at [facility]

## 2018-07-30 NOTE — Telephone Encounter (Signed)
-----   Message from Milus Banister, MD sent at 07/30/2018  5:49 AM EST ----- Regarding: RE: request for EUS Billy [name], Happy to help.  I'll reband his varices if needed. If the lesions in his duodenum are not vascular, I'll sample or remove them.  Thanks  [name], HE needs upper eus in early to mid Feb for abnormal duodenum.  MAC, [facility].  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 I was rebanding him in Jan and saw some funny polyps ? Varices in the duodenum.  Would you be willing to do his next EGD to f/u varices and also do EUS oin the duodenum?  I told him that I would be asking an EUS doc to do next one.  Sometime in February is what I was thinking.  Thanks  Billy [name]

## 2018-07-30 NOTE — Telephone Encounter (Signed)
EUS scheduled, pt instructed and medications reviewed.  Patient instructions mailed to home.  Patient to call with any questions or concerns.  

## 2018-08-11 ENCOUNTER — Ambulatory Visit: Payer: 59 | Admitting: Internal Medicine

## 2018-08-24 ENCOUNTER — Other Ambulatory Visit: Payer: Self-pay

## 2018-08-24 MED ORDER — HYDROCODONE-ACETAMINOPHEN 5-325 MG PO TABS: 1.0000 | ORAL_TABLET | Freq: Three times a day (TID) | ORAL | 0 refills | Status: DC | PRN

## 2018-08-24 NOTE — Telephone Encounter (Signed)
Name of Medication: Hydrocodone Name of Pharmacy: [facility] [place] or Written Date and Quantity: 07-27-18 #90 Last Office Visit and Type: 06-03-18 3 Mo F/U Next Office Visit and Type: 09-08-18 3 Mo F/U Last Controlled Substance Agreement Date: 08-20-17 Last UDS: 08-20-17

## 2018-08-27 ENCOUNTER — Encounter (HOSPITAL_COMMUNITY): Payer: Self-pay | Admitting: *Deleted

## 2018-08-27 ENCOUNTER — Ambulatory Visit (HOSPITAL_COMMUNITY): Payer: 59 | Admitting: Certified Registered Nurse Anesthetist

## 2018-08-27 ENCOUNTER — Encounter (HOSPITAL_COMMUNITY)
Admission: RE | Disposition: A | Discharge status: Home / Self Care | Payer: Self-pay | Source: Home / Self Care | Attending: Gastroenterology

## 2018-08-27 ENCOUNTER — Ambulatory Visit (HOSPITAL_COMMUNITY)
Admission: RE | Admit: 2018-08-27 | Discharge: 2018-08-27 | Disposition: A | Discharge status: Home / Self Care | Payer: 59 | Attending: Gastroenterology | Admitting: Gastroenterology

## 2018-08-27 DIAGNOSIS — F17210 Nicotine dependence, cigarettes, uncomplicated: Secondary | ICD-10-CM | POA: Insufficient documentation

## 2018-08-27 DIAGNOSIS — K766 Portal hypertension: Secondary | ICD-10-CM | POA: Diagnosis not present

## 2018-08-27 DIAGNOSIS — K2980 Duodenitis without bleeding: Secondary | ICD-10-CM | POA: Diagnosis not present

## 2018-08-27 DIAGNOSIS — I10 Essential (primary) hypertension: Secondary | ICD-10-CM | POA: Insufficient documentation

## 2018-08-27 DIAGNOSIS — K7030 Alcoholic cirrhosis of liver without ascites: Secondary | ICD-10-CM | POA: Insufficient documentation

## 2018-08-27 DIAGNOSIS — K219 Gastro-esophageal reflux disease without esophagitis: Secondary | ICD-10-CM | POA: Insufficient documentation

## 2018-08-27 DIAGNOSIS — K3189 Other diseases of stomach and duodenum: Secondary | ICD-10-CM | POA: Diagnosis not present

## 2018-08-27 DIAGNOSIS — K700 Alcoholic fatty liver: Secondary | ICD-10-CM | POA: Diagnosis not present

## 2018-08-27 DIAGNOSIS — I8510 Secondary esophageal varices without bleeding: Secondary | ICD-10-CM | POA: Diagnosis not present

## 2018-08-27 DIAGNOSIS — J45909 Unspecified asthma, uncomplicated: Secondary | ICD-10-CM | POA: Diagnosis not present

## 2018-08-27 DIAGNOSIS — K319 Disease of stomach and duodenum, unspecified: Secondary | ICD-10-CM

## 2018-08-27 DIAGNOSIS — I8500 Esophageal varices without bleeding: Secondary | ICD-10-CM | POA: Insufficient documentation

## 2018-08-27 DIAGNOSIS — F329 Major depressive disorder, single episode, unspecified: Secondary | ICD-10-CM | POA: Diagnosis not present

## 2018-08-27 DIAGNOSIS — F419 Anxiety disorder, unspecified: Secondary | ICD-10-CM | POA: Diagnosis not present

## 2018-08-27 DIAGNOSIS — Z85828 Personal history of other malignant neoplasm of skin: Secondary | ICD-10-CM | POA: Diagnosis not present

## 2018-08-27 HISTORY — PX: BIOPSY: SHX5522

## 2018-08-27 HISTORY — PX: EUS: SHX5427

## 2018-08-27 HISTORY — PX: ESOPHAGEAL BANDING: SHX5518

## 2018-08-27 HISTORY — PX: ESOPHAGOGASTRODUODENOSCOPY: SHX5428

## 2018-08-27 SURGERY — UPPER ENDOSCOPIC ULTRASOUND (EUS) RADIAL
Anesthesia: Monitor Anesthesia Care

## 2018-08-27 MED ORDER — LACTATED RINGERS IV SOLN
INTRAVENOUS | Status: AC | PRN
  Administered 2018-08-27: 1000 mL via INTRAVENOUS

## 2018-08-27 MED ORDER — EPHEDRINE SULFATE-NACL 50-0.9 MG/10ML-% IV SOSY
PREFILLED_SYRINGE | INTRAVENOUS | Status: DC | PRN
  Administered 2018-08-27: 5 mg via INTRAVENOUS

## 2018-08-27 MED ORDER — PROPOFOL 10 MG/ML IV BOLUS
INTRAVENOUS | Status: AC
  Filled 2018-08-27: qty 20

## 2018-08-27 MED ORDER — SODIUM CHLORIDE 0.9 % IV SOLN: INTRAVENOUS | Status: DC

## 2018-08-27 MED ORDER — PROPOFOL 500 MG/50ML IV EMUL
INTRAVENOUS | Status: DC | PRN
  Administered 2018-08-27: 125 ug/kg/min via INTRAVENOUS

## 2018-08-27 MED ORDER — PROPOFOL 10 MG/ML IV BOLUS
INTRAVENOUS | Status: DC | PRN
  Administered 2018-08-27 (×3): 30 mg via INTRAVENOUS
  Administered 2018-08-27: 10 mg via INTRAVENOUS
  Administered 2018-08-27: 30 mg via INTRAVENOUS

## 2018-08-27 MED ORDER — PROPOFOL 10 MG/ML IV BOLUS
INTRAVENOUS | Status: AC
  Filled 2018-08-27: qty 60

## 2018-08-27 NOTE — Op Note (Signed)
[facility] Patient Name: Billy [name] Procedure Date: 08/27/2018 MRN: [id number] Attending MD: Milus Banister , MD Date of Birth: 16-Feb-1946 CSN: [id number] Age: 73 Admit Type: Outpatient Procedure:                Upper EUS Indications:              Etoh related cirrhosis, known esophageal varices,                            last banded 05/2018 Dr. Carlean Purl, during that                            procedure duodenal nodules noted (unclear etiology) Providers:                Milus Banister, MD, Cleda Daub, RN, Tinnie Gens, Technician, Dellie Catholic Referring MD:             Silvano Rusk, MD Medicines:                Monitored Anesthesia Care Complications:            No immediate complications. Estimated blood loss:                            None. Estimated Blood Loss:     Estimated blood loss: none. Procedure:                Pre-Anesthesia Assessment:                           - Prior to the procedure, a History and Physical                            was performed, and patient medications and                            allergies were reviewed. The patient's tolerance of                            previous anesthesia was also reviewed. The risks                            and benefits of the procedure and the sedation                            options and risks were discussed with the patient.                            All questions were answered, and informed consent                            was obtained. Prior Anticoagulants: The patient has  taken no previous anticoagulant or antiplatelet                            agents. ASA Grade Assessment: IV - A patient with                            severe systemic disease that is a constant threat                            to life. After reviewing the risks and benefits,                            the patient was deemed in satisfactory condition to                  undergo the procedure.                           After obtaining informed consent, the endoscope was                            passed under direct vision. Throughout the                            procedure, the patient's blood pressure, pulse, and                            oxygen saturations were monitored continuously. The                            GF-UE160-[id number] ([id number]) Olympus Radial EUS was                            introduced through the mouth, and advanced to the                            second part of duodenum. The GIF-H190 ([id number])                            Olympus gastroscope was introduced through the                            mouth, and advanced to the second part of duodenum.                            The upper EUS was accomplished without difficulty.                            The patient tolerated the procedure well. Scope In: Scope Out: Findings:      ENDOSCOPIC FINDING: :      1. Moderate portal gastropathy, most impressive in the proximal stomach.       No gastric varices.      2. Large distal esophagus varices, two trunks. Clear evidence of       previous banding and no  signs to suggest recent bleeding. Following EUS       evaluation and duodenal biosies, I placed 6 ligating bands on the       esophageal varices.      3. Several subcentimeter, soft nodules in the duodenal bulb. Following       EUS evaluation I sampled the nodules with forceps. There was slightly       more post biospy bleeding than is usual but the oozing stopped with       saline flushing, time.      ENDOSONOGRAPHIC FINDING: :      1. The soft duodenal bulb nodules described above corresponded with       superficial (mucosa, deep mucosa) lesions. Doppler interrrogation proved       these were not signficant vascular structures such as varices.      2. The esophageal varices described above correlated with Dopper +       superficial vascular structures.      3. Limited  evaluation of Impression:               - Large esophageal varices. Ligated.                           - Moderate portal gastropathy. No gastric varices.                           - Duodenal nodules noted 05/2018 were confirmed by                            EUS to not be vascular/variceal structures and were                            sampled with biospy forceps. Moderate Sedation:      Not Applicable - Patient had care per Anesthesia. Recommendation:           - Discharge patient to home (ambulatory).                           - Await final pathology.                           - Will need repeat EGD for variceal ligating in                            next several weeks. Procedure Code(s):        --- Professional ---                           [phone], Esophagogastroduodenoscopy, flexible,                            transoral; with band ligation of esophageal/gastric                            varices                           43237, Esophagogastroduodenoscopy, flexible,  transoral; with endoscopic ultrasound examination                            limited to the esophagus, stomach or duodenum, and                            adjacent structures                           43239, 59, Esophagogastroduodenoscopy, flexible,                            transoral; with biopsy, single or multiple Diagnosis Code(s):        --- Professional ---                           I85.00, Esophageal varices without bleeding                           K31.89, Other diseases of stomach and duodenum CPT copyright 2018 American Medical Association. All rights reserved. The codes documented in this report are preliminary and upon coder review may  be revised to meet current compliance requirements. Milus Banister, MD 08/27/2018 9:31:36 AM This report has been signed electronically. Number of Addenda: 0

## 2018-08-27 NOTE — Transfer of Care (Signed)
Immediate Anesthesia Transfer of Care Note  Patient: Billy [name]  Procedure(s) Performed: UPPER ENDOSCOPIC ULTRASOUND (EUS) RADIAL (N/A ) ESOPHAGEAL BANDING BIOPSY  Patient Location: Endoscopy Unit  Anesthesia Type:MAC  Level of Consciousness: awake and patient cooperative  Airway & Oxygen Therapy: Patient Spontanous Breathing and Patient connected to nasal cannula oxygen  Post-op Assessment: Report given to RN and Post -op Vital signs reviewed and stable  Post vital signs: Reviewed and stable  Last Vitals:  Vitals Value Taken Time  BP    Temp    Pulse 67 08/27/2018  9:17 AM  Resp 22 08/27/2018  9:17 AM  SpO2 100 % 08/27/2018  9:17 AM  Vitals shown include unvalidated device data.  Last Pain:  Vitals:   08/27/18 0809  TempSrc: Oral  PainSc: 0-No pain         Complications: No apparent anesthesia complications

## 2018-08-27 NOTE — Discharge Instructions (Signed)
YOU HAD AN ENDOSCOPIC PROCEDURE TODAY: Refer to the procedure report and other information in the discharge instructions given to you for any specific questions about what was found during the examination. If this information does not answer your questions, please call [facility] office at [phone] to clarify.   YOU SHOULD EXPECT: Some feelings of bloating in the abdomen. Passage of more gas than usual. Walking can help get rid of the air that was put into your GI tract during the procedure and reduce the bloating. If you had a lower endoscopy (such as a colonoscopy or flexible sigmoidoscopy) you may notice spotting of blood in your stool or on the toilet paper. Some abdominal soreness may be present for a day or two, also.  DIET: Your first meal following the procedure should be a light meal and then it is ok to progress to your normal diet. A half-sandwich or bowl of soup is an example of a good first meal. Heavy or fried foods are harder to digest and may make you feel nauseous or bloated. Drink plenty of fluids but you should avoid alcoholic beverages for 24 hours. If you had a esophageal dilation, please see attached instructions for diet.    ACTIVITY: Your care partner should take you home directly after the procedure. You should plan to take it easy, moving slowly for the rest of the day. You can resume normal activity the day after the procedure however YOU SHOULD NOT DRIVE, use power tools, machinery or perform tasks that involve climbing or major physical exertion for 24 hours (because of the sedation medicines used during the test).   SYMPTOMS TO REPORT IMMEDIATELY: A gastroenterologist can be reached at any hour. Please call [phone]  for any of the following symptoms:   Following upper endoscopy (EGD, EUS, ERCP, esophageal dilation) Vomiting of blood or coffee ground material  New, significant abdominal pain  New, significant chest pain or pain under the shoulder blades  Painful or  persistently difficult swallowing  New shortness of breath  Black, tarry-looking or red, bloody stools  FOLLOW UP:  If any biopsies were taken you will be contacted by phone or by letter within the next 1-3 weeks. Call [phone]  if you have not heard about the biopsies in 3 weeks.  Please also call with any specific questions about appointments or follow up tests.  

## 2018-08-27 NOTE — H&P (Signed)
HPI: This is a very pleasant 73 yo [name]   Chief complaint is etoh related cirrhosis. [place] esoph varices. Also duodenal nodules of unclear etiology  ROS: complete GI ROS as described in HPI, all other review negative.  Constitutional:  No unintentional weight loss   Past Medical History:  Diagnosis Date  . Acute posthemorrhagic anemia   . Alcoholic cirrhosis of liver ([place]) 09/24/2011   Years of alcohol use. Progressed from fatty liver to cirrhosis based upon review imaging. Manifestations are portal hypertension with esophageal varices, portal gastropathy, rectal varices and he may have portal colopathy. Ascites also present. Naive to HAV and HBV Claims he has had pneumococcal vaccine   . Alcoholism ([place])   . Angiodysplasia of colon 09/11/2011   Multiple in the right colon, ablated with APC. Question if this is portal colopathy.   . Anxiety   . Asthma   . Back pain   . Basal cell carcinoma   . Bilateral varicoceles   . Campylobacter diarrhea 01/07/2012  . Cataract    removed  . Depression    mild at most  . Diverticulosis of colon   . Erectile dysfunction   . Gastric ulcer 08/2011   small, antral ulcer  . GERD (gastroesophageal reflux disease)    takes Omeprazole prn  . GI hemorrhage 2006, 2013   esophagitis and 1+ varices, melena in 2013   . History of blood transfusion    as a child and no abnormal reaction noted  . History of bronchitis    >80yrs ago  . Hydrocele   . Hypertension    takes Nadolol daily  . Personal history of colonic polyps   . 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 Scrotal edema   . Sleep disturbance    takes Remeron nightly  . Thrombocytopenia, unspecified ([place])   . 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 CATARACT EXTRACTION  2013   bilateral with IOL  . CHOLECYSTECTOMY  12/31/2012   Procedure: LAPAROSCOPIC CHOLECYSTECTOMY;  Surgeon:  Zenovia Jarred, MD;  Location: [facility];  Service: General;;  . COLONOSCOPY  2001, 200411/02/2006  . COLONOSCOPY  09/11/2011   Procedure: COLONOSCOPY;  Surgeon: Gatha Mayer, MD;  [facility];  Service: Endoscopy;  Laterality: N/A;  . COLONOSCOPY    . ESOPHAGEAL BANDING N/A 05/02/2017   Procedure: ESOPHAGEAL BANDING;  Surgeon: Gatha Mayer, MD;  [facility];  Service: Endoscopy;  Laterality: N/A;  . ESOPHAGEAL BANDING  06/09/2018   Procedure: ESOPHAGEAL BANDING;  Surgeon: Gatha Mayer, MD;  [facility];  Service: Endoscopy;;  . ESOPHAGOGASTRODUODENOSCOPY  2006  . ESOPHAGOGASTRODUODENOSCOPY  09/11/2011   Procedure: ESOPHAGOGASTRODUODENOSCOPY (EGD);  Surgeon: Gatha Mayer, MD;  Location: [facility];  Service: Endoscopy;  Laterality: N/A;  . ESOPHAGOGASTRODUODENOSCOPY (EGD) WITH PROPOFOL N/A 04/13/2017   Procedure: ESOPHAGOGASTRODUODENOSCOPY (EGD) WITH PROPOFOL;  Surgeon: Doran Stabler, MD;  [facility];  Service: Gastroenterology;  Laterality: N/A;  . ESOPHAGOGASTRODUODENOSCOPY (EGD) WITH PROPOFOL N/A 05/02/2017   Procedure: ESOPHAGOGASTRODUODENOSCOPY (EGD) WITH PROPOFOL;  Surgeon: Gatha Mayer, MD;  [facility];  Service: Endoscopy;  Laterality: N/A;  . ESOPHAGOGASTRODUODENOSCOPY (EGD) WITH PROPOFOL N/A 09/30/2017   Procedure: ESOPHAGOGASTRODUODENOSCOPY (EGD) WITH PROPOFOL;  Surgeon: Gatha Mayer, MD;  [facility];  Service: Endoscopy;  Laterality: N/A;  . ESOPHAGOGASTRODUODENOSCOPY (EGD) WITH PROPOFOL N/A 06/09/2018   Procedure: ESOPHAGOGASTRODUODENOSCOPY (EGD) WITH  PROPOFOL;  Surgeon: Gatha Mayer, MD;  Location: [facility];  Service: Endoscopy;  Laterality: N/A;  . EYE SURGERY    . FLEXIBLE SIGMOIDOSCOPY  01/07/2012   Procedure: FLEXIBLE SIGMOIDOSCOPY;  Surgeon: Lafayette Dragon, MD;  [facility];  Service: Endoscopy;  Laterality: N/A;  . KNEE ARTHROSCOPY  2008   Right  . KNEE ARTHROSCOPY  2008    Left  . POLYPECTOMY    . [place] THORA/PARACENTESIS  10/28/2011  . Varicocele repair  1980's    Current Facility-Administered Medications  Medication Dose Route Frequency Provider Last Rate Last Dose  . 0.9 %  sodium chloride infusion   Intravenous Continuous Milus Banister, MD      . lactated ringers infusion    Continuous PRN Milus Banister, MD   1,000 mL at 08/27/18 0813    Allergies as of 07/30/2018 - Review Complete 06/09/2018  Allergen Reaction Noted  . Yellow jacket venom Anaphylaxis, Shortness Of Breath, and Other (See Comments) 04/28/2017  . Nsaids Other (See Comments) 12/21/2012    Family History  Problem Relation Age of Onset  . Stroke Mother   . Diabetes Mother   . Lung cancer Brother   . Prostate cancer Brother   . Liver cancer Cousin   . Liver cancer Paternal Uncle   . Colon cancer Neg Hx   . Stomach cancer Neg Hx   . Esophageal cancer Neg Hx   . Colon polyps Neg Hx   . Rectal cancer Neg Hx   . Pancreatic cancer Neg Hx     Social History   Socioeconomic History  . Marital status: Married    Spouse name: Not on file  . Number of children: 1  . Years of education: Not on file  . Highest education level: Not on file  Occupational History  . Occupation: Retired    Comment: mostly from Research officer, political party Needs  . Financial resource strain: Not on file  . Food insecurity:    Worry: Not on file    Inability: Not on file  . Transportation needs:    Medical: Not on file    Non-medical: Not on file  Tobacco Use  . Smoking status: Light Tobacco Smoker    Packs/day: 0.50    Types: Cigarettes, E-cigarettes  . Smokeless tobacco: Never Used  Substance and Sexual Activity  . Alcohol use: No    Alcohol/week: 0.0 standard drinks  . Drug use: Yes    Types: Marijuana    Comment: occasionally  . Sexual activity: Never  Lifestyle  . Physical activity:    Days per week: Not on file    Minutes per session: Not on file  . Stress: Not on file  Relationships  .  Social connections:    Talks on phone: Not on file    Gets together: Not on file    Attends religious service: Not on file    Active member of club or organization: Not on file    Attends meetings of clubs or organizations: Not on file    Relationship status: Not on file  . Intimate partner violence:    Fear of current or ex partner: Not on file    Emotionally abused: Not on file    Physically abused: Not on file    Forced sexual activity: Not on file  Other Topics Concern  . Not on file  Social History Narrative   Married- 2nd   1 son (adopted) from previous marriage  Has living will   Wife is  health care POA---alternate is son   Would accept attempts at resuscitation   No tube feeds if cognitively unaware        Physical Exam: BP (!) 122/47   Temp 98.8 F (37.1 C) (Oral)   Resp 18   Ht 6' 2.5" (1.892 m)   Wt 79.4 kg   SpO2 98%   BMI Billy.17 kg/m  Constitutional: generally well-appearing Psychiatric: alert and oriented x3 Abdomen: soft, nontender, nondistended, no obvious ascites, no peritoneal signs, normal bowel sounds No peripheral edema noted in lower extremities  Assessment and plan: 73 y.o. male with esophageal varices, duodenal nodules  For upper EUS, +/- duodenal polyp biospies, +/- variceal band ligation  Please see the "Patient Instructions" section for addition details about the plan.  Owens Loffler, MD [facility] 08/27/2018, 8:14 AM

## 2018-08-27 NOTE — Anesthesia Procedure Notes (Signed)
Procedure Name: MAC Date/Time: 08/27/2018 8:32 AM Performed by: Claudia Desanctis, CRNA Pre-anesthesia Checklist: Patient identified, Emergency Drugs available, Suction available, Patient being monitored and Timeout performed Patient Re-evaluated:Patient Re-evaluated prior to induction Oxygen Delivery Method: Simple face mask

## 2018-08-27 NOTE — Anesthesia Preprocedure Evaluation (Signed)
Anesthesia Evaluation  Patient identified by MRN, date of birth, ID band Patient awake    Reviewed: Allergy & Precautions, NPO status , Patient's Chart, lab work & pertinent test results  Airway Mallampati: I  TM Distance: >3 FB Neck ROM: Full    Dental  (+) Teeth Intact, Dental Advisory Given   Pulmonary asthma , Current Smoker,    breath sounds clear to auscultation       Cardiovascular hypertension,  Rhythm:Regular Rate:Normal     Neuro/Psych PSYCHIATRIC DISORDERS Anxiety Depression negative neurological ROS     GI/Hepatic PUD, GERD  Medicated,(+) Cirrhosis   Esophageal Varices    , Hepatitis -  Endo/Other  negative endocrine ROS  Renal/GU negative Renal ROS     Musculoskeletal  (+) Arthritis ,   Abdominal Normal abdominal exam  (+)   Peds  Hematology  (+) anemia ,   Anesthesia Other Findings   Reproductive/Obstetrics                             Anesthesia Physical  Anesthesia Plan  ASA: III  Anesthesia Plan: MAC   Post-op Pain Management:    Induction: Intravenous  PONV Risk Score and Plan: Propofol infusion  Airway Management Planned: Natural Airway and Nasal Cannula  Additional Equipment: None  Intra-op Plan:   Post-operative Plan:   Informed Consent: I have reviewed the patients History and Physical, chart, labs and discussed the procedure including the risks, benefits and alternatives for the proposed anesthesia with the patient or authorized representative who has indicated his/her understanding and acceptance.     Dental advisory given  Plan Discussed with: CRNA  Anesthesia Plan Comments:         Anesthesia Quick Evaluation

## 2018-08-28 ENCOUNTER — Encounter (HOSPITAL_COMMUNITY): Payer: Self-pay | Admitting: Gastroenterology

## 2018-08-29 NOTE — Anesthesia Postprocedure Evaluation (Signed)
Anesthesia Post Note  Patient: Seward Carol  Procedure(s) Performed: UPPER ENDOSCOPIC ULTRASOUND (EUS) RADIAL (N/A ) ESOPHAGEAL BANDING BIOPSY ESOPHAGOGASTRODUODENOSCOPY (EGD) (N/A )     Patient location during evaluation: PACU Anesthesia Type: MAC Level of consciousness: awake and alert Pain management: pain level controlled Vital Signs Assessment: post-procedure vital signs reviewed and stable Respiratory status: spontaneous breathing Cardiovascular status: stable Anesthetic complications: no    Last Vitals:  Vitals:   08/27/18 0925 08/27/18 0935  BP: 120/63 118/72  Pulse: 65 66  Resp: (!) 21 20  Temp:    SpO2: 100% 100%    Last Pain:  Vitals:   08/27/18 0935  TempSrc:   PainSc: 0-No pain   Pain Goal:                   Nolon Nations

## 2018-09-08 ENCOUNTER — Encounter: Payer: Self-pay | Admitting: Internal Medicine

## 2018-09-08 ENCOUNTER — Ambulatory Visit (INDEPENDENT_AMBULATORY_CARE_PROVIDER_SITE_OTHER): Payer: 59 | Admitting: Internal Medicine

## 2018-09-08 ENCOUNTER — Ambulatory Visit: Payer: 59 | Admitting: Internal Medicine

## 2018-09-08 VITALS — BP 110/62 | HR 65 | Temp 98.0°F | Ht 75.0 in | Wt 170.0 lb

## 2018-09-08 DIAGNOSIS — F1120 Opioid dependence, uncomplicated: Secondary | ICD-10-CM | POA: Diagnosis not present

## 2018-09-08 DIAGNOSIS — K7030 Alcoholic cirrhosis of liver without ascites: Secondary | ICD-10-CM | POA: Diagnosis not present

## 2018-09-08 DIAGNOSIS — M549 Dorsalgia, unspecified: Secondary | ICD-10-CM | POA: Diagnosis not present

## 2018-09-08 DIAGNOSIS — F1021 Alcohol dependence, in remission: Secondary | ICD-10-CM | POA: Diagnosis not present

## 2018-09-08 DIAGNOSIS — G8929 Other chronic pain: Secondary | ICD-10-CM

## 2018-09-08 NOTE — Assessment & Plan Note (Signed)
Remains abstinent 

## 2018-09-08 NOTE — Assessment & Plan Note (Signed)
Still with some disability from this Does okay with the pain meds

## 2018-09-08 NOTE — Progress Notes (Signed)
Subjective:    Patient ID: Billy [name], male    DOB: 03/31/1946, 73 y.o.   MRN: [id number]  HPI Here for follow up of chronic back pain and narcotic dependence  Recent endoscopic ultrasound Multiple esophageal varices without acute bleeding These were banded Remains abstinent from alcohol  No longer having the orthostatic dizziness Was having itching/scabbing on legs---this is better Did have oozing after biopsies in duodenum---they were benign No other abnormal bleeding  Still limited by his back pain Can't stand --like in kitchen--for more than 10 minutes Does better if he is actually doing something--like walking Continues on the hydrocodone regularly  Had right low back pain--to buttocks Better with stretching  Has cut back more on smoking Using lifesavers instead  Current Outpatient Medications on File Prior to Visit  Medication Sig Dispense Refill  . buPROPion (WELLBUTRIN SR) 150 MG 12 hr tablet TAKE 1 TABLET BY MOUTH EVERY DAY (Patient taking differently: Take 150 mg by mouth daily. ) 90 tablet 3  . cholecalciferol (VITAMIN D3) 25 MCG (1000 UT) tablet Take 1,000 Units by mouth daily.    . ferrous sulfate 325 (65 FE) MG EC tablet Take 325 mg by mouth every other day.    . fluocinonide cream (LIDEX) 0.05 % APPLY ON THE SKIN TWICE A DAY AS NEEDED FOR RASH (Patient taking differently: Apply 1 application topically 2 (two) times daily as needed (for skin rash/irritation). APPLY ON THE SKIN TWICE A DAY AS NEEDED FOR RASH) 60 g 2  . furosemide (LASIX) 20 MG tablet Take 3 tablets (60 mg total) by mouth every morning. (Patient taking differently: Take 60 mg by mouth daily. ) 270 tablet 3  . HYDROcodone-acetaminophen (NORCO/VICODIN) 5-325 MG tablet Take 1 tablet by mouth 3 (three) times daily as needed (for pain.). 90 tablet 0  . mirtazapine (REMERON) 45 MG tablet TAKE 1 TABLET (45 MG TOTAL) BY MOUTH AT BEDTIME. 90 tablet 3  . Multiple Vitamin (MULTIVITAMIN WITH MINERALS)  TABS Take 1 tablet by mouth daily with supper.     . nadolol (CORGARD) 40 MG tablet TAKE 1 TABLET (40 MG TOTAL) BY MOUTH DAILY. (Patient taking differently: Take 40 mg by mouth daily. ) 90 tablet 3  . omeprazole (PRILOSEC) 40 MG capsule TAKE 1 CAPSULE BY MOUTH TWICE A DAY (Patient taking differently: Take 40 mg by mouth 2 (two) times daily. ) 180 capsule 3  . PREVIDENT 5000 BOOSTER PLUS 1.1 % PSTE Place 1 application onto teeth at bedtime.  5  . psyllium (METAMUCIL) 58.6 % powder Take 1 packet by mouth at bedtime.     Marland Kitchen spironolactone (ALDACTONE) 50 MG tablet Take 4 tablets (200 mg total) by mouth daily. (Patient taking differently: Take 150 mg by mouth daily. ) 120 tablet 1  . vitamin C (ASCORBIC ACID) 250 MG tablet Take 250 mg by mouth daily.      No current facility-administered medications on file prior to visit.     Allergies  Allergen Reactions  . Yellow Jacket Venom Anaphylaxis, Shortness Of Breath and Other (See Comments)    Throat closes up/eye swell shut  . Nsaids Other (See Comments)    Stomach pain, Bleeding risk    Past Medical History:  Diagnosis Date  . Acute posthemorrhagic anemia   . Alcoholic cirrhosis of liver ([place]) 09/24/2011   Years of alcohol use. Progressed from fatty liver to cirrhosis based upon review imaging. Manifestations are portal hypertension with esophageal varices, portal gastropathy, rectal varices and he may  have portal colopathy. Ascites also present. Naive to HAV and HBV Claims he has had pneumococcal vaccine   . Alcoholism ([place])   . Angiodysplasia of colon 09/11/2011   Multiple in the right colon, ablated with APC. Question if this is portal colopathy.   . Anxiety   . Asthma   . Back pain   . Basal cell carcinoma   . Bilateral varicoceles   . Campylobacter diarrhea 01/07/2012  . Cataract    removed  . Depression    mild at most  . Diverticulosis of colon   . Erectile dysfunction   . Gastric ulcer 08/2011   small, antral ulcer  . GERD  (gastroesophageal reflux disease)    takes Omeprazole prn  . GI hemorrhage 2006, 2013   esophagitis and 1+ varices, melena in 2013   . History of blood transfusion    as a child and no abnormal reaction noted  . History of bronchitis    >42yrs ago  . Hydrocele   . Hypertension    takes Nadolol daily  . Personal history of colonic polyps   . 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 Scrotal edema   . Sleep disturbance    takes Remeron nightly  . Thrombocytopenia, unspecified ([place])   . Varices, esophageal ([place]) 08/2011   Grade 2, mid-esophagus    Past Surgical History:  Procedure Laterality Date  . BIOPSY  08/27/2018   Procedure: BIOPSY;  Surgeon: Milus Banister, MD;  [facility];  Service: Endoscopy;;  . CATARACT EXTRACTION  2013   bilateral with IOL  . CHOLECYSTECTOMY  12/31/2012   Procedure: LAPAROSCOPIC CHOLECYSTECTOMY;  Surgeon: Zenovia Jarred, MD;  Location: [facility];  Service: General;;  . COLONOSCOPY  2001, 200411/02/2006  . COLONOSCOPY  09/11/2011   Procedure: COLONOSCOPY;  Surgeon: Gatha Mayer, MD;  [facility];  Service: Endoscopy;  Laterality: N/A;  . COLONOSCOPY    . ESOPHAGEAL BANDING N/A 05/02/2017   Procedure: ESOPHAGEAL BANDING;  Surgeon: Gatha Mayer, MD;  [facility];  Service: Endoscopy;  Laterality: N/A;  . ESOPHAGEAL BANDING  06/09/2018   Procedure: ESOPHAGEAL BANDING;  Surgeon: Gatha Mayer, MD;  [facility];  Service: Endoscopy;;  . ESOPHAGEAL BANDING  08/27/2018   Procedure: ESOPHAGEAL BANDING;  Surgeon: Milus Banister, MD;  [facility];  Service: Endoscopy;;  . ESOPHAGOGASTRODUODENOSCOPY  2006  . ESOPHAGOGASTRODUODENOSCOPY  09/11/2011   Procedure: ESOPHAGOGASTRODUODENOSCOPY (EGD);  Surgeon: Gatha Mayer, MD;  Location[facility];  Service: Endoscopy;  Laterality: N/A;  . ESOPHAGOGASTRODUODENOSCOPY N/A 08/27/2018   Procedure: ESOPHAGOGASTRODUODENOSCOPY (EGD);   Surgeon: Milus Banister, MD;  Location: [facility];  Service: Endoscopy;  Laterality: N/A;  . ESOPHAGOGASTRODUODENOSCOPY (EGD) WITH PROPOFOL N/A 04/13/2017   Procedure: ESOPHAGOGASTRODUODENOSCOPY (EGD) WITH PROPOFOL;  Surgeon: Doran Stabler, MD;  [facility];  Service: Gastroenterology;  Laterality: N/A;  . ESOPHAGOGASTRODUODENOSCOPY (EGD) WITH PROPOFOL N/A 05/02/2017   Procedure: ESOPHAGOGASTRODUODENOSCOPY (EGD) WITH PROPOFOL;  Surgeon: Gatha Mayer, MD;  [facility];  Service: Endoscopy;  Laterality: N/A;  . ESOPHAGOGASTRODUODENOSCOPY (EGD) WITH PROPOFOL N/A 09/30/2017   Procedure: ESOPHAGOGASTRODUODENOSCOPY (EGD) WITH PROPOFOL;  Surgeon: Gatha Mayer, MD;  [facility];  Service: Endoscopy;  Laterality: N/A;  . ESOPHAGOGASTRODUODENOSCOPY (EGD) WITH PROPOFOL N/A 06/09/2018   Procedure: ESOPHAGOGASTRODUODENOSCOPY (EGD) WITH PROPOFOL;  Surgeon: Gatha Mayer, MD;  [facility];  Service: Endoscopy;  Laterality: N/A;  . EUS N/A 08/27/2018   Procedure:  UPPER ENDOSCOPIC ULTRASOUND (EUS) RADIAL;  Surgeon: Milus Banister, MD;  [facility];  Service: Endoscopy;  Laterality: N/A;  . EYE SURGERY    . FLEXIBLE SIGMOIDOSCOPY  01/07/2012   Procedure: FLEXIBLE SIGMOIDOSCOPY;  Surgeon: Lafayette Dragon, MD;  [facility];  Service: Endoscopy;  Laterality: N/A;  . KNEE ARTHROSCOPY  2008   Right  . KNEE ARTHROSCOPY  2008   Left  . POLYPECTOMY    . [place] THORA/PARACENTESIS  10/28/2011  . Varicocele repair  30's    Family History  Problem Relation Age of Onset  . Stroke Mother   . Diabetes Mother   . Lung cancer Brother   . Prostate cancer Brother   . Liver cancer Cousin   . Liver cancer Paternal Uncle   . Colon cancer Neg Hx   . Stomach cancer Neg Hx   . Esophageal cancer Neg Hx   . Colon polyps Neg Hx   . Rectal cancer Neg Hx   . Pancreatic cancer Neg Hx     Social History   Socioeconomic History  . Marital status:  Married    Spouse name: Not on file  . Number of children: 1  . Years of education: Not on file  . Highest education level: Not on file  Occupational History  . Occupation: Retired    Comment: mostly from Research officer, political party Needs  . Financial resource strain: Not on file  . Food insecurity:    Worry: Not on file    Inability: Not on file  . Transportation needs:    Medical: Not on file    Non-medical: Not on file  Tobacco Use  . Smoking status: Light Tobacco Smoker    Packs/day: 0.50    Types: Cigarettes, E-cigarettes  . Smokeless tobacco: Never Used  Substance and Sexual Activity  . Alcohol use: No    Alcohol/week: 0.0 standard drinks  . Drug use: Yes    Types: Marijuana    Comment: occasionally  . Sexual activity: Never  Lifestyle  . Physical activity:    Days per week: Not on file    Minutes per session: Not on file  . Stress: Not on file  Relationships  . Social connections:    Talks on phone: Not on file    Gets together: Not on file    Attends religious service: Not on file    Active member of club or organization: Not on file    Attends meetings of clubs or organizations: Not on file    Relationship status: Not on file  . Intimate partner violence:    Fear of current or ex partner: Not on file    Emotionally abused: Not on file    Physically abused: Not on file    Forced sexual activity: Not on file  Other Topics Concern  . Not on file  Social History Narrative   Married- 2nd   1 son (adopted) from previous marriage      Has living will   Wife is  health care POA---alternate is son   Would accept attempts at resuscitation   No tube feeds if cognitively unaware      Review of Systems Hearing has improved Has had elevated glucoses on recent blood work--but not fasting Weight down slightly--probably less fluid    Objective:   Physical Exam  Constitutional: He appears well-developed. No distress.  Psychiatric: He has a normal mood and affect. His  behavior is normal.  Assessment & Plan:

## 2018-09-08 NOTE — Assessment & Plan Note (Signed)
Reviewed CSRS No concerns

## 2018-09-08 NOTE — Assessment & Plan Note (Signed)
Ongoing varices Fluid is better

## 2018-09-11 LAB — PAIN MGMT, PROFILE 8 W/CONF, U
6 Acetylmorphine: NEGATIVE ng/mL (ref <10)
AMPHETAMINES: NEGATIVE ng/mL (ref <500)
Alcohol Metabolites: POSITIVE ng/mL — AB (ref <500)
Benzodiazepines: NEGATIVE ng/mL (ref <100)
Buprenorphine, Urine: NEGATIVE ng/mL (ref <5)
CREATININE: 153.2 mg/dL
Cocaine Metabolite: NEGATIVE ng/mL (ref <150)
Codeine: NEGATIVE ng/mL (ref <50)
ETHYL SULFATE (ETS): 5594 ng/mL — AB (ref <100)
Ethyl Glucuronide (ETG): 14938 ng/mL — ABNORMAL HIGH (ref <500)
Hydrocodone: 3697 ng/mL — ABNORMAL HIGH (ref <50)
Hydromorphone: 174 ng/mL — ABNORMAL HIGH (ref <50)
MDMA: NEGATIVE ng/mL (ref <500)
Marijuana Metabolite: 301 ng/mL — ABNORMAL HIGH (ref <5)
Marijuana Metabolite: POSITIVE ng/mL — AB (ref <20)
Morphine: 64 ng/mL — ABNORMAL HIGH (ref <50)
Norhydrocodone: 3323 ng/mL — ABNORMAL HIGH (ref <50)
Opiates: POSITIVE ng/mL — AB (ref <100)
Oxidant: NEGATIVE ug/mL (ref <200)
Oxycodone: NEGATIVE ng/mL (ref <100)
pH: 6.04 (ref 4.5–9.0)

## 2018-09-17 ENCOUNTER — Other Ambulatory Visit: Payer: Self-pay

## 2018-09-17 DIAGNOSIS — I8500 Esophageal varices without bleeding: Secondary | ICD-10-CM

## 2018-09-17 NOTE — Progress Notes (Signed)
Please arrange for EGD and banding on April [facility] scope day

## 2018-09-22 ENCOUNTER — Ambulatory Visit (INDEPENDENT_AMBULATORY_CARE_PROVIDER_SITE_OTHER): Payer: 59 | Admitting: Internal Medicine

## 2018-09-22 ENCOUNTER — Encounter: Payer: Self-pay | Admitting: Internal Medicine

## 2018-09-22 VITALS — BP 118/80 | HR 69 | Temp 98.1°F | Ht 75.0 in | Wt 168.0 lb

## 2018-09-22 DIAGNOSIS — F1029 Alcohol dependence with unspecified alcohol-induced disorder: Secondary | ICD-10-CM | POA: Diagnosis not present

## 2018-09-22 MED ORDER — HYDROCODONE-ACETAMINOPHEN 5-325 MG PO TABS: 1.0000 | ORAL_TABLET | Freq: Three times a day (TID) | ORAL | 0 refills | Status: DC | PRN

## 2018-09-22 NOTE — Progress Notes (Signed)
Subjective:    Patient ID: Billy [name], male    DOB: 28-May-1946, 73 y.o.   MRN: [id number]  HPI Here to review his UDS---due to the alcohol  He was at his brother's funeral Had some wine while there---flew back and then his appointment here was the next Otherwise he has been abstinent  Current Outpatient Medications on File Prior to Visit  Medication Sig Dispense Refill  . buPROPion (WELLBUTRIN SR) 150 MG 12 hr tablet TAKE 1 TABLET BY MOUTH EVERY DAY (Patient taking differently: Take 150 mg by mouth daily. ) 90 tablet 3  . cholecalciferol (VITAMIN D3) 25 MCG (1000 UT) tablet Take 1,000 Units by mouth daily.    . ferrous sulfate 325 (65 FE) MG EC tablet Take 325 mg by mouth every other day.    . fluocinonide cream (LIDEX) 0.05 % APPLY ON THE SKIN TWICE A DAY AS NEEDED FOR RASH (Patient taking differently: Apply 1 application topically 2 (two) times daily as needed (for skin rash/irritation). APPLY ON THE SKIN TWICE A DAY AS NEEDED FOR RASH) 60 g 2  . furosemide (LASIX) 20 MG tablet Take 3 tablets (60 mg total) by mouth every morning. (Patient taking differently: Take 60 mg by mouth daily. ) 270 tablet 3  . HYDROcodone-acetaminophen (NORCO/VICODIN) 5-325 MG tablet Take 1 tablet by mouth 3 (three) times daily as needed (for pain.). 90 tablet 0  . mirtazapine (REMERON) 45 MG tablet TAKE 1 TABLET (45 MG TOTAL) BY MOUTH AT BEDTIME. 90 tablet 3  . Multiple Vitamin (MULTIVITAMIN WITH MINERALS) TABS Take 1 tablet by mouth daily with supper.     . nadolol (CORGARD) 40 MG tablet TAKE 1 TABLET (40 MG TOTAL) BY MOUTH DAILY. (Patient taking differently: Take 40 mg by mouth daily. ) 90 tablet 3  . omeprazole (PRILOSEC) 40 MG capsule TAKE 1 CAPSULE BY MOUTH TWICE A DAY (Patient taking differently: Take 40 mg by mouth 2 (two) times daily. ) 180 capsule 3  . PREVIDENT 5000 BOOSTER PLUS 1.1 % PSTE Place 1 application onto teeth at bedtime.  5  . psyllium (METAMUCIL) 58.6 % powder Take 1 packet by mouth  at bedtime.     Marland Kitchen spironolactone (ALDACTONE) 50 MG tablet Take 4 tablets (200 mg total) by mouth daily. (Patient taking differently: Take 150 mg by mouth daily. ) 120 tablet 1  . vitamin C (ASCORBIC ACID) 250 MG tablet Take 250 mg by mouth daily.      No current facility-administered medications on file prior to visit.     Allergies  Allergen Reactions  . Yellow Jacket Venom Anaphylaxis, Shortness Of Breath and Other (See Comments)    Throat closes up/eye swell shut  . Nsaids Other (See Comments)    Stomach pain, Bleeding risk    Past Medical History:  Diagnosis Date  . Acute posthemorrhagic anemia   . Alcoholic cirrhosis of liver ([place]) 09/24/2011   Years of alcohol use. Progressed from fatty liver to cirrhosis based upon review imaging. Manifestations are portal hypertension with esophageal varices, portal gastropathy, rectal varices and he may have portal colopathy. Ascites also present. Naive to HAV and HBV Claims he has had pneumococcal vaccine   . Alcoholism ([place])   . Angiodysplasia of colon 09/11/2011   Multiple in the right colon, ablated with APC. Question if this is portal colopathy.   . Anxiety   . Asthma   . Back pain   . Basal cell carcinoma   . Bilateral varicoceles   .  Campylobacter diarrhea 01/07/2012  . Cataract    removed  . Depression    mild at most  . Diverticulosis of colon   . Erectile dysfunction   . Gastric ulcer 08/2011   small, antral ulcer  . GERD (gastroesophageal reflux disease)    takes Omeprazole prn  . GI hemorrhage 2006, 2013   esophagitis and 1+ varices, melena in 2013   . History of blood transfusion    as a child and no abnormal reaction noted  . History of bronchitis    >73yrs ago  . Hydrocele   . Hypertension    takes Nadolol daily  . Personal history of colonic polyps   . 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 Scrotal edema   . Sleep disturbance    takes Remeron nightly  . Thrombocytopenia,  unspecified ([place])   . 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 BIOPSY  08/27/2018   Procedure: BIOPSY;  Surgeon: Milus Banister, MD;  [facility];  Service: Endoscopy;;  . CATARACT EXTRACTION  2013   bilateral with IOL  . CHOLECYSTECTOMY  12/31/2012   Procedure: LAPAROSCOPIC CHOLECYSTECTOMY;  Surgeon: Zenovia Jarred, MD;  Location: [facility];  Service: General;;  . COLONOSCOPY  2001, 200411/02/2006  . COLONOSCOPY  09/11/2011   Procedure: COLONOSCOPY;  Surgeon: Gatha Mayer, MD;  [facility];  Service: Endoscopy;  Laterality: N/A;  . COLONOSCOPY    . ESOPHAGEAL BANDING N/A 05/02/2017   Procedure: ESOPHAGEAL BANDING;  Surgeon: Gatha Mayer, MD;  [facility];  Service: Endoscopy;  Laterality: N/A;  . ESOPHAGEAL BANDING  06/09/2018   Procedure: ESOPHAGEAL BANDING;  Surgeon: Gatha Mayer, MD;  [facility];  Service: Endoscopy;;  . ESOPHAGEAL BANDING  08/27/2018   Procedure: ESOPHAGEAL BANDING;  Surgeon: Milus Banister, MD;  [facility];  Service: Endoscopy;;  . ESOPHAGOGASTRODUODENOSCOPY  2006  . ESOPHAGOGASTRODUODENOSCOPY  09/11/2011   Procedure: ESOPHAGOGASTRODUODENOSCOPY (EGD);  Surgeon: Gatha Mayer, MD;  [facility];  Service: Endoscopy;  Laterality: N/A;  . ESOPHAGOGASTRODUODENOSCOPY N/A 08/27/2018   Procedure: ESOPHAGOGASTRODUODENOSCOPY (EGD);  Surgeon: Milus Banister, MD;  Location[facility];  Service: Endoscopy;  Laterality: N/A;  . ESOPHAGOGASTRODUODENOSCOPY (EGD) WITH PROPOFOL N/A 04/13/2017   Procedure: ESOPHAGOGASTRODUODENOSCOPY (EGD) WITH PROPOFOL;  Surgeon: Doran Stabler, MD;  [facility];  Service: Gastroenterology;  Laterality: N/A;  . ESOPHAGOGASTRODUODENOSCOPY (EGD) WITH PROPOFOL N/A 05/02/2017   Procedure: ESOPHAGOGASTRODUODENOSCOPY (EGD) WITH PROPOFOL;  Surgeon: Gatha Mayer, MD;  [facility];   Service: Endoscopy;  Laterality: N/A;  . ESOPHAGOGASTRODUODENOSCOPY (EGD) WITH PROPOFOL N/A 09/30/2017   Procedure: ESOPHAGOGASTRODUODENOSCOPY (EGD) WITH PROPOFOL;  Surgeon: Gatha Mayer, MD;  [facility];  Service: Endoscopy;  Laterality: N/A;  . ESOPHAGOGASTRODUODENOSCOPY (EGD) WITH PROPOFOL N/A 06/09/2018   Procedure: ESOPHAGOGASTRODUODENOSCOPY (EGD) WITH PROPOFOL;  Surgeon: Gatha Mayer, MD;  [facility];  Service: Endoscopy;  Laterality: N/A;  . EUS N/A 08/27/2018   Procedure: UPPER ENDOSCOPIC ULTRASOUND (EUS) RADIAL;  Surgeon: Milus Banister, MD;  [facility];  Service: Endoscopy;  Laterality: N/A;  . EYE SURGERY    . FLEXIBLE SIGMOIDOSCOPY  01/07/2012   Procedure: FLEXIBLE SIGMOIDOSCOPY;  Surgeon: Lafayette Dragon, MD;  [facility];  Service: Endoscopy;  Laterality: N/A;  . KNEE ARTHROSCOPY  2008   Right  . KNEE ARTHROSCOPY  2008   Left  .  POLYPECTOMY    . [place] THORA/PARACENTESIS  10/28/2011  . Varicocele repair  77's    Family History  Problem Relation Age of Onset  . Stroke Mother   . Diabetes Mother   . Lung cancer Brother   . Prostate cancer Brother   . Liver cancer Cousin   . Liver cancer Paternal Uncle   . Colon cancer Neg Hx   . Stomach cancer Neg Hx   . Esophageal cancer Neg Hx   . Colon polyps Neg Hx   . Rectal cancer Neg Hx   . Pancreatic cancer Neg Hx     Social History   Socioeconomic History  . Marital status: Married    Spouse name: Not on file  . Number of children: 1  . Years of education: Not on file  . Highest education level: Not on file  Occupational History  . Occupation: Retired    Comment: mostly from Research officer, political party Needs  . Financial resource strain: Not on file  . Food insecurity:    Worry: Not on file    Inability: Not on file  . Transportation needs:    Medical: Not on file    Non-medical: Not on file  Tobacco Use  . Smoking status: Light Tobacco Smoker    Packs/day: 0.50    Types:  Cigarettes, E-cigarettes  . Smokeless tobacco: Never Used  Substance and Sexual Activity  . Alcohol use: No    Alcohol/week: 0.0 standard drinks  . Drug use: Yes    Types: Marijuana    Comment: occasionally  . Sexual activity: Never  Lifestyle  . Physical activity:    Days per week: Not on file    Minutes per session: Not on file  . Stress: Not on file  Relationships  . Social connections:    Talks on phone: Not on file    Gets together: Not on file    Attends religious service: Not on file    Active member of club or organization: Not on file    Attends meetings of clubs or organizations: Not on file    Relationship status: Not on file  . Intimate partner violence:    Fear of current or ex partner: Not on file    Emotionally abused: Not on file    Physically abused: Not on file    Forced sexual activity: Not on file  Other Topics Concern  . Not on file  Social History Narrative   Married- 2nd   1 son (adopted) from previous marriage      Has living will   Wife is  health care POA---alternate is son   Would accept attempts at resuscitation   No tube feeds if cognitively unaware      Review of Systems Appetite is okay Weight is stable    Objective:   Physical Exam  Constitutional: He appears well-developed. No distress.  Psychiatric:  Not depressed repentent           Assessment & Plan:

## 2018-09-22 NOTE — Assessment & Plan Note (Signed)
Swears he just had the wine at his brother's funeral Will recheck UDS to make sure alcohol is gone

## 2018-09-25 ENCOUNTER — Other Ambulatory Visit: Payer: Self-pay | Admitting: Internal Medicine

## 2018-09-25 LAB — PAIN MGMT, PROFILE 8 W/CONF, U
6 Acetylmorphine: NEGATIVE ng/mL (ref <10)
Alcohol Metabolites: NEGATIVE ng/mL (ref <500)
Amphetamines: NEGATIVE ng/mL (ref <500)
Benzodiazepines: NEGATIVE ng/mL (ref <100)
Buprenorphine, Urine: NEGATIVE ng/mL (ref <5)
Cocaine Metabolite: NEGATIVE ng/mL (ref <150)
Codeine: NEGATIVE ng/mL (ref <50)
Creatinine: 33.8 mg/dL
Hydrocodone: NEGATIVE ng/mL (ref <50)
Hydromorphone: NEGATIVE ng/mL (ref <50)
MARIJUANA METABOLITE: 52 ng/mL — AB (ref <5)
MDMA: NEGATIVE ng/mL (ref <500)
Marijuana Metabolite: POSITIVE ng/mL — AB (ref <20)
Morphine: NEGATIVE ng/mL (ref <50)
Norhydrocodone: 60 ng/mL — ABNORMAL HIGH (ref <50)
Opiates: POSITIVE ng/mL — AB (ref <100)
Oxidant: NEGATIVE ug/mL (ref <200)
Oxycodone: NEGATIVE ng/mL (ref <100)
pH: 6.03 (ref 4.5–9.0)

## 2018-10-20 ENCOUNTER — Other Ambulatory Visit: Payer: Self-pay

## 2018-10-20 MED ORDER — HYDROCODONE-ACETAMINOPHEN 5-325 MG PO TABS: 1.0000 | ORAL_TABLET | Freq: Three times a day (TID) | ORAL | 0 refills | Status: DC | PRN

## 2018-10-20 NOTE — Telephone Encounter (Signed)
Name of Medication: Hydrocodone Name of Pharmacy: [facility] or Written Date and Quantity: 09-22-18 #90 Last Office Visit and Type: 3 month f/u 09-22-18 Next Office Visit and Type: 3 month f/u 12-30-18 Last Controlled Substance Agreement Date: 09-08-18 Last UDS: 09-22-18

## 2018-10-22 ENCOUNTER — Other Ambulatory Visit: Payer: Self-pay | Admitting: Internal Medicine

## 2018-10-22 MED ORDER — NADOLOL 40 MG PO TABS: ORAL_TABLET | ORAL | 3 refills | Status: DC

## 2018-10-22 MED ORDER — SPIRONOLACTONE 50 MG PO TABS: 200.0000 mg | ORAL_TABLET | Freq: Every day | ORAL | 1 refills | Status: DC

## 2018-10-22 NOTE — Telephone Encounter (Signed)
Pt requested refill for nadolol and spironolactone.

## 2018-10-22 NOTE — Telephone Encounter (Signed)
Prescriptions sent to patient's pharmacy.

## 2018-10-26 ENCOUNTER — Ambulatory Visit (HOSPITAL_COMMUNITY): Admission: RE | Admit: 2018-10-26 | Payer: 59 | Source: Home / Self Care | Admitting: Internal Medicine

## 2018-10-26 ENCOUNTER — Encounter (HOSPITAL_COMMUNITY): Admission: RE | Payer: Self-pay | Source: Home / Self Care

## 2018-10-26 SURGERY — ESOPHAGOGASTRODUODENOSCOPY (EGD) WITH PROPOFOL
Anesthesia: Monitor Anesthesia Care

## 2018-11-10 ENCOUNTER — Telehealth: Payer: Self-pay | Admitting: Internal Medicine

## 2018-11-10 NOTE — Telephone Encounter (Signed)
Patient called said that he wants to clarification on the med spironolactone (ALDACTONE) 50 MG tablet. He use to hake 3 a day and now it says 4 a day and also he use to get a 3 month supply and now its only for one month.

## 2018-11-10 NOTE — Telephone Encounter (Signed)
Please clarify Sir, thank you.

## 2018-11-11 MED ORDER — SPIRONOLACTONE 50 MG PO TABS: 200.0000 mg | ORAL_TABLET | Freq: Every day | ORAL | 2 refills | Status: DC

## 2018-11-11 NOTE — Telephone Encounter (Signed)
I spoke with Billy [name] and informed him of the dose he is suppose to be on and confirmed the pharmacy. Spironolactone refilled to his local pharmacy per his request.

## 2018-11-11 NOTE — Telephone Encounter (Signed)
4 x 50 mg a day = 200 mg  Can send long-term 90 day supply rx for 200 mg qd  2 refills

## 2018-11-16 ENCOUNTER — Telehealth: Payer: Self-pay | Admitting: Internal Medicine

## 2018-11-16 ENCOUNTER — Other Ambulatory Visit: Payer: Self-pay

## 2018-11-16 MED ORDER — HYDROCODONE-ACETAMINOPHEN 5-325 MG PO TABS: 1.0000 | ORAL_TABLET | Freq: Three times a day (TID) | ORAL | 0 refills | Status: DC | PRN

## 2018-11-16 MED ORDER — SPIRONOLACTONE 50 MG PO TABS: 200.0000 mg | ORAL_TABLET | Freq: Every day | ORAL | 2 refills | Status: DC

## 2018-11-16 NOTE — Telephone Encounter (Signed)
Pharmacy called said if you can resent the med for spironolactone (ALDACTONE) 50 MG tablet. States it did not fully go thru.

## 2018-11-16 NOTE — Telephone Encounter (Signed)
Name of Medication: Hydrocodone Name of Pharmacy: [facility] or Written Date and Quantity: 10-20-18 #90  Last Office Visit and Type: 09-22-18 Next Office Visit and Type: 3 Month f/u 12-30-18 Last Controlled Substance Agreement Date: 09-08-18 Last UDS: 09-08-18

## 2018-11-16 NOTE — Telephone Encounter (Signed)
I spoke to the pharmacy and resent the spironolactone, the image didn't come thru last week . I held until it went thru.

## 2018-11-17 ENCOUNTER — Other Ambulatory Visit: Payer: Self-pay | Admitting: Internal Medicine

## 2018-11-26 ENCOUNTER — Telehealth: Payer: Self-pay

## 2018-11-26 NOTE — Telephone Encounter (Signed)
Left message for patient to call back  

## 2018-11-26 NOTE — Telephone Encounter (Signed)
-----   Message from Gatha Mayer, MD sent at 11/26/2018 12:59 PM EDT ----- Regarding: hospital case Let's put him on for my hospital session in June

## 2018-11-30 NOTE — Telephone Encounter (Signed)
Left message for patient to call back Tentative date 01/05/19

## 2018-12-03 ENCOUNTER — Other Ambulatory Visit: Payer: Self-pay

## 2018-12-03 DIAGNOSIS — I8500 Esophageal varices without bleeding: Secondary | ICD-10-CM

## 2018-12-03 NOTE — Telephone Encounter (Signed)
Patient notified of appt dates and times 01/05/19 [facility] at 7:00 arrival for 8:30.  He is advised of pre-Covid screen on 01/01/19

## 2018-12-14 ENCOUNTER — Other Ambulatory Visit: Payer: Self-pay

## 2018-12-14 MED ORDER — HYDROCODONE-ACETAMINOPHEN 5-325 MG PO TABS: 1.0000 | ORAL_TABLET | Freq: Three times a day (TID) | ORAL | 0 refills | Status: DC | PRN

## 2018-12-14 NOTE — Telephone Encounter (Signed)
Name of Medication: Hydrocodone Name of Pharmacy: [facility] or Written Date and Quantity: 11-16-18 #90 Last Office Visit and Type: 09-22-18  Next Office Visit and Type: 12-30-18 Last Controlled Substance Agreement Date: 09-14-18 Last UDS: 09-22-18

## 2018-12-30 ENCOUNTER — Ambulatory Visit (INDEPENDENT_AMBULATORY_CARE_PROVIDER_SITE_OTHER): Payer: 59 | Admitting: Internal Medicine

## 2018-12-30 ENCOUNTER — Encounter: Payer: Self-pay | Admitting: Internal Medicine

## 2018-12-30 VITALS — Wt 170.0 lb

## 2018-12-30 DIAGNOSIS — I10 Essential (primary) hypertension: Secondary | ICD-10-CM

## 2018-12-30 DIAGNOSIS — I8510 Secondary esophageal varices without bleeding: Secondary | ICD-10-CM

## 2018-12-30 DIAGNOSIS — K766 Portal hypertension: Secondary | ICD-10-CM

## 2018-12-30 DIAGNOSIS — Z0000 Encounter for general adult medical examination without abnormal findings: Secondary | ICD-10-CM

## 2018-12-30 DIAGNOSIS — K7030 Alcoholic cirrhosis of liver without ascites: Secondary | ICD-10-CM

## 2018-12-30 DIAGNOSIS — F1020 Alcohol dependence, uncomplicated: Secondary | ICD-10-CM | POA: Diagnosis not present

## 2018-12-30 DIAGNOSIS — F1120 Opioid dependence, uncomplicated: Secondary | ICD-10-CM

## 2018-12-30 MED ORDER — FLUOCINONIDE 0.05 % EX CREA: 1.0000 "application " | TOPICAL_CREAM | Freq: Two times a day (BID) | CUTANEOUS | 2 refills | Status: DC | PRN

## 2018-12-30 NOTE — Assessment & Plan Note (Signed)
Has been abstinent again for ~ 2 years

## 2018-12-30 NOTE — Assessment & Plan Note (Signed)
Compensated now No edema  Hasn't been drinking

## 2018-12-30 NOTE — Assessment & Plan Note (Signed)
No current signs of decompensation

## 2018-12-30 NOTE — Assessment & Plan Note (Signed)
PDMP reviewed  No problems 

## 2018-12-30 NOTE — Assessment & Plan Note (Signed)
Due for repeat EGD soon

## 2018-12-30 NOTE — Assessment & Plan Note (Signed)
Doing better Exercise tolerance has improved Will be due flu vaccine in the fall Discussed cigarette cessation Consider colon again 2023

## 2018-12-30 NOTE — Progress Notes (Signed)
Subjective:    Patient ID: Billy [name], male    DOB: Sep 29, 1945, 73 y.o.   MRN: [id number]  HPI Virtual visit for physical Identification done Reviewed billing and he gives consent He is at home and I am in my office  He has noticed improvements in stamina and strength over the past 2 years Able to run up the stairs now or walking up hills  No alcohol in about 2 years Is due for repeat EGD soon--will have COVID test first Recent tooth extraction---didn't bleed abnormally Still bruises easily No GI bleeding that he can tell  Smoking a little Rare e-cigs--if in car 1/3 PPD Discussed stopping with nicotine replacement  Concerned about his weight being low Does walk a mile most days--and does some light weights  . Current Outpatient Medications on File Prior to Visit  Medication Sig Dispense Refill  . buPROPion (WELLBUTRIN SR) 150 MG 12 hr tablet TAKE 1 TABLET BY MOUTH EVERY DAY (Patient taking differently: Take 150 mg by mouth daily. ) 90 tablet 3  . cholecalciferol (VITAMIN D3) 25 MCG (1000 UT) tablet Take 1,000 Units by mouth daily.    . ferrous sulfate 325 (65 FE) MG EC tablet Take 325 mg by mouth every other day.    . fluocinonide cream (LIDEX) 0.05 % APPLY ON THE SKIN TWICE A DAY AS NEEDED FOR RASH (Patient taking differently: Apply 1 application topically 2 (two) times daily as needed (for skin rash/irritation). APPLY ON THE SKIN TWICE A DAY AS NEEDED FOR RASH) 60 g 2  . furosemide (LASIX) 20 MG tablet Take 3 tablets (60 mg total) by mouth every morning. (Patient taking differently: Take 60 mg by mouth daily. ) 270 tablet 3  . HYDROcodone-acetaminophen (NORCO/VICODIN) 5-325 MG tablet Take 1 tablet by mouth 3 (three) times daily as needed (for pain.). 90 tablet 0  . mirtazapine (REMERON) 45 MG tablet TAKE 1 TABLET (45 MG TOTAL) BY MOUTH AT BEDTIME. 90 tablet 3  . Multiple Vitamin (MULTIVITAMIN WITH MINERALS) TABS Take 1 tablet by mouth daily with supper.     . nadolol  (CORGARD) 40 MG tablet TAKE 1 TABLET (40 MG TOTAL) BY MOUTH DAILY. 90 tablet 3  . omeprazole (PRILOSEC) 40 MG capsule TAKE 1 CAPSULE BY MOUTH TWICE A DAY (Patient taking differently: Take 40 mg by mouth 2 (two) times daily. ) 180 capsule 3  . PREVIDENT 5000 BOOSTER PLUS 1.1 % PSTE Place 1 application onto teeth 2 (two) times daily.   5  . psyllium (METAMUCIL) 58.6 % powder Take 1 packet by mouth at bedtime.     Marland Kitchen spironolactone (ALDACTONE) 50 MG tablet Take 4 tablets (200 mg total) by mouth daily. 360 tablet 2  . vitamin C (ASCORBIC ACID) 250 MG tablet Take 250 mg by mouth daily.      No current facility-administered medications on file prior to visit.     Allergies  Allergen Reactions  . Yellow Jacket Venom Anaphylaxis, Shortness Of Breath and Other (See Comments)    Throat closes up/eye swell shut  . Nsaids Other (See Comments)    Stomach pain, Bleeding risk    Past Medical History:  Diagnosis Date  . Acute posthemorrhagic anemia   . Alcoholic cirrhosis of liver ([place]) 09/24/2011   Years of alcohol use. Progressed from fatty liver to cirrhosis based upon review imaging. Manifestations are portal hypertension with esophageal varices, portal gastropathy, rectal varices and he may have portal colopathy. Ascites also present. Naive to HAV and  HBV Claims he has had pneumococcal vaccine   . Alcoholism ([place])   . Angiodysplasia of colon 09/11/2011   Multiple in the right colon, ablated with APC. Question if this is portal colopathy.   . Anxiety   . Asthma   . Back pain   . Basal cell carcinoma   . Bilateral varicoceles   . Campylobacter diarrhea 01/07/2012  . Cataract    removed  . Depression    mild at most  . Diverticulosis of colon   . Erectile dysfunction   . Gastric ulcer 08/2011   small, antral ulcer  . GERD (gastroesophageal reflux disease)    takes Omeprazole prn  . GI hemorrhage 2006, 2013   esophagitis and 1+ varices, melena in 2013   . History of blood transfusion    as a  child and no abnormal reaction noted  . History of bronchitis    >89yrs ago  . Hydrocele   . Hypertension    takes Nadolol daily  . Personal history of colonic polyps   . 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 Scrotal edema   . Sleep disturbance    takes Remeron nightly  . Thrombocytopenia, unspecified ([place])   . 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 BIOPSY  08/27/2018   Procedure: BIOPSY;  Surgeon: Milus Banister, MD;  [facility];  Service: Endoscopy;;  . CATARACT EXTRACTION  2013   bilateral with IOL  . CHOLECYSTECTOMY  12/31/2012   Procedure: LAPAROSCOPIC CHOLECYSTECTOMY;  Surgeon: Zenovia Jarred, MD;  Location: [facility];  Service: General;;  . COLONOSCOPY  2001, 200411/02/2006  . COLONOSCOPY  09/11/2011   Procedure: COLONOSCOPY;  Surgeon: Gatha Mayer, MD;  [facility];  Service: Endoscopy;  Laterality: N/A;  . COLONOSCOPY    . ESOPHAGEAL BANDING N/A 05/02/2017   Procedure: ESOPHAGEAL BANDING;  Surgeon: Gatha Mayer, MD;  [facility];  Service: Endoscopy;  Laterality: N/A;  . ESOPHAGEAL BANDING  06/09/2018   Procedure: ESOPHAGEAL BANDING;  Surgeon: Gatha Mayer, MD;  [facility];  Service: Endoscopy;;  . ESOPHAGEAL BANDING  08/27/2018   Procedure: ESOPHAGEAL BANDING;  Surgeon: Milus Banister, MD;  [facility];  Service: Endoscopy;;  . ESOPHAGOGASTRODUODENOSCOPY  2006  . ESOPHAGOGASTRODUODENOSCOPY  09/11/2011   Procedure: ESOPHAGOGASTRODUODENOSCOPY (EGD);  Surgeon: Gatha Mayer, MD;  [facility];  Service: Endoscopy;  Laterality: N/A;  . ESOPHAGOGASTRODUODENOSCOPY N/A 08/27/2018   Procedure: ESOPHAGOGASTRODUODENOSCOPY (EGD);  Surgeon: Milus Banister, MD;  [facility];  Service: Endoscopy;  Laterality: N/A;  . ESOPHAGOGASTRODUODENOSCOPY (EGD) WITH PROPOFOL N/A 04/13/2017   Procedure:  ESOPHAGOGASTRODUODENOSCOPY (EGD) WITH PROPOFOL;  Surgeon: Doran Stabler, MD;  [facility];  Service: Gastroenterology;  Laterality: N/A;  . ESOPHAGOGASTRODUODENOSCOPY (EGD) WITH PROPOFOL N/A 05/02/2017   Procedure: ESOPHAGOGASTRODUODENOSCOPY (EGD) WITH PROPOFOL;  Surgeon: Gatha Mayer, MD;  [facility];  Service: Endoscopy;  Laterality: N/A;  . ESOPHAGOGASTRODUODENOSCOPY (EGD) WITH PROPOFOL N/A 09/30/2017   Procedure: ESOPHAGOGASTRODUODENOSCOPY (EGD) WITH PROPOFOL;  Surgeon: Gatha Mayer, MD;  [facility];  Service: Endoscopy;  Laterality: N/A;  . ESOPHAGOGASTRODUODENOSCOPY (EGD) WITH PROPOFOL N/A 06/09/2018   Procedure: ESOPHAGOGASTRODUODENOSCOPY (EGD) WITH PROPOFOL;  Surgeon: Gatha Mayer, MD;  [facility];  Service: Endoscopy;  Laterality: N/A;  . EUS N/A 08/27/2018   Procedure: UPPER ENDOSCOPIC ULTRASOUND (EUS) RADIAL;  Surgeon: Milus Banister,  MD;  [facility];  Service: Endoscopy;  Laterality: N/A;  . EYE SURGERY    . FLEXIBLE SIGMOIDOSCOPY  01/07/2012   Procedure: FLEXIBLE SIGMOIDOSCOPY;  Surgeon: Lafayette Dragon, MD;  [facility];  Service: Endoscopy;  Laterality: N/A;  . KNEE ARTHROSCOPY  2008   Right  . KNEE ARTHROSCOPY  2008   Left  . POLYPECTOMY    . [place] THORA/PARACENTESIS  10/28/2011  . Varicocele repair  72's    Family History  Problem Relation Age of Onset  . Stroke Mother   . Diabetes Mother   . Lung cancer Brother   . Prostate cancer Brother   . Liver cancer Cousin   . Liver cancer Paternal Uncle   . Colon cancer Neg Hx   . Stomach cancer Neg Hx   . Esophageal cancer Neg Hx   . Colon polyps Neg Hx   . Rectal cancer Neg Hx   . Pancreatic cancer Neg Hx     Social History   Socioeconomic History  . Marital status: Married    Spouse name: Not on file  . Number of children: 1  . Years of education: Not on file  . Highest education level: Not on file  Occupational History  .  Occupation: Retired    Comment: mostly from Research officer, political party Needs  . Financial resource strain: Not on file  . Food insecurity    Worry: Not on file    Inability: Not on file  . Transportation needs    Medical: Not on file    Non-medical: Not on file  Tobacco Use  . Smoking status: Light Tobacco Smoker    Packs/day: 0.50    Types: Cigarettes, E-cigarettes  . Smokeless tobacco: Never Used  Substance and Sexual Activity  . Alcohol use: No    Alcohol/week: 0.0 standard drinks  . Drug use: Yes    Types: Marijuana    Comment: occasionally  . Sexual activity: Never  Lifestyle  . Physical activity    Days per week: Not on file    Minutes per session: Not on file  . Stress: Not on file  Relationships  . Social Herbalist on phone: Not on file    Gets together: Not on file    Attends religious service: Not on file    Active member of club or organization: Not on file    Attends meetings of clubs or organizations: Not on file    Relationship status: Not on file  . Intimate partner violence    Fear of current or ex partner: Not on file    Emotionally abused: Not on file    Physically abused: Not on file    Forced sexual activity: Not on file  Other Topics Concern  . Not on file  Social History Narrative   Married- 2nd   1 son (adopted) from previous marriage      Has living will   Wife is  health care POA---alternate is son   Would accept attempts at resuscitation   No tube feeds if cognitively unaware      Review of Systems  Constitutional: Negative for fatigue and unexpected weight change.  HENT: Positive for hearing loss and tinnitus. Negative for dental problem and trouble swallowing.        Will be seeing hearing specialist at [facility]--considering cochlear implant Sounds are muffled in right ear  Eyes: Negative for visual disturbance.       No diplopia or  unilateral vision loss  Respiratory: Negative for chest tightness and shortness of breath.        Coughs  at night if he takes lifesavers at night and it causes increased saliva  Cardiovascular: Negative for chest pain, palpitations and leg swelling.  Gastrointestinal: Positive for constipation. Negative for abdominal pain, anal bleeding and blood in stool.       Metamucil helps  Endocrine: Negative for polydipsia and polyuria.  Genitourinary: Positive for difficulty urinating. Negative for urgency.       Slow stream  Musculoskeletal: Positive for back pain.       Fair control of back pain with the narcotics. Still has some bad days though  Skin: Negative for rash.       Some itching and scabbing on legs---cortisone cream helps  Allergic/Immunologic: Negative for environmental allergies and immunocompromised state.  Neurological: Negative for dizziness, syncope, light-headedness and headaches.  Hematological: Negative for adenopathy. Bruises/bleeds easily.  Psychiatric/Behavioral: Negative for dysphoric mood and sleep disturbance. The patient is not nervous/anxious.        Objective:   Physical Exam  Constitutional: He appears well-developed. No distress.  Respiratory: Effort normal. No respiratory distress.  Musculoskeletal:        General: No edema.  Skin:  No foot lesions  Psychiatric: He has a normal mood and affect. His behavior is normal.           Assessment & Plan:

## 2018-12-30 NOTE — Assessment & Plan Note (Signed)
BP Readings from Last 3 Encounters:  09/22/18 118/80  09/08/18 110/62  08/27/18 118/72   Okay on beta blocker for varices

## 2019-01-01 ENCOUNTER — Other Ambulatory Visit (INDEPENDENT_AMBULATORY_CARE_PROVIDER_SITE_OTHER): Payer: 59

## 2019-01-01 ENCOUNTER — Other Ambulatory Visit: Payer: Self-pay

## 2019-01-01 ENCOUNTER — Other Ambulatory Visit (HOSPITAL_COMMUNITY)
Admission: RE | Admit: 2019-01-01 | Discharge: 2019-01-01 | Disposition: A | Discharge status: Home / Self Care | Payer: 59 | Source: Ambulatory Visit | Attending: Internal Medicine | Admitting: Internal Medicine

## 2019-01-01 DIAGNOSIS — Z1159 Encounter for screening for other viral diseases: Secondary | ICD-10-CM | POA: Diagnosis present

## 2019-01-01 DIAGNOSIS — I10 Essential (primary) hypertension: Secondary | ICD-10-CM

## 2019-01-01 LAB — CBC
HCT: 36.4 % — ABNORMAL LOW (ref 39.0–52.0)
Hemoglobin: 12.5 g/dL — ABNORMAL LOW (ref 13.0–17.0)
MCHC: 34.2 g/dL (ref 30.0–36.0)
MCV: 101.8 fl — ABNORMAL HIGH (ref 78.0–100.0)
Platelets: 105 10*3/uL — ABNORMAL LOW (ref 150.0–400.0)
RBC: 3.58 Mil/uL — ABNORMAL LOW (ref 4.22–5.81)
RDW: 14.7 % (ref 11.5–15.5)
WBC: 5.9 10*3/uL (ref 4.0–10.5)

## 2019-01-01 LAB — RENAL FUNCTION PANEL
Albumin: 3.1 g/dL — ABNORMAL LOW (ref 3.5–5.2)
BUN: 21 mg/dL (ref 6–23)
CO2: 29 mEq/L (ref 19–32)
Calcium: 8.6 mg/dL (ref 8.4–10.5)
Chloride: 97 mEq/L (ref 96–112)
Creatinine, Ser: 1.08 mg/dL (ref 0.40–1.50)
GFR: 67.44 mL/min (ref >60.00)
Glucose, Bld: 89 mg/dL (ref 70–99)
Phosphorus: 2.9 mg/dL (ref 2.3–4.6)
Potassium: 5.2 mEq/L — ABNORMAL HIGH (ref 3.5–5.1)
Sodium: 132 mEq/L — ABNORMAL LOW (ref 135–145)

## 2019-01-01 LAB — HEPATIC FUNCTION PANEL
ALT: 14 U/L (ref 0–53)
AST: 31 U/L (ref 0–37)
Albumin: 3.1 g/dL — ABNORMAL LOW (ref 3.5–5.2)
Alkaline Phosphatase: 79 U/L (ref 39–117)
Bilirubin, Direct: 0.3 mg/dL (ref 0.0–0.3)
Total Bilirubin: 0.8 mg/dL (ref 0.2–1.2)
Total Protein: 6.5 g/dL (ref 6.0–8.3)

## 2019-01-01 LAB — SARS CORONAVIRUS 2 (TAT 6-24 HRS): SARS Coronavirus 2: NEGATIVE

## 2019-01-04 ENCOUNTER — Encounter (HOSPITAL_COMMUNITY): Payer: Self-pay

## 2019-01-04 ENCOUNTER — Other Ambulatory Visit: Payer: Self-pay

## 2019-01-04 NOTE — Progress Notes (Signed)
sw patient, confirmed covid 19 test negative.  Pt states has no s/s since testing. Answered questions regarding test.  Patient to arrive at 0730 for 830 procedure.

## 2019-01-04 NOTE — Progress Notes (Signed)
Covid test negative

## 2019-01-05 ENCOUNTER — Other Ambulatory Visit: Payer: Self-pay

## 2019-01-05 ENCOUNTER — Encounter (HOSPITAL_COMMUNITY): Payer: Self-pay

## 2019-01-05 ENCOUNTER — Ambulatory Visit (HOSPITAL_COMMUNITY)
Admission: RE | Admit: 2019-01-05 | Discharge: 2019-01-05 | Disposition: A | Discharge status: Home / Self Care | Payer: 59 | Attending: Internal Medicine | Admitting: Internal Medicine

## 2019-01-05 ENCOUNTER — Other Ambulatory Visit: Payer: Self-pay | Admitting: Internal Medicine

## 2019-01-05 ENCOUNTER — Encounter (HOSPITAL_COMMUNITY)
Admission: RE | Disposition: A | Discharge status: Home / Self Care | Payer: Self-pay | Source: Home / Self Care | Attending: Internal Medicine

## 2019-01-05 ENCOUNTER — Ambulatory Visit (HOSPITAL_COMMUNITY): Payer: 59 | Admitting: Certified Registered Nurse Anesthetist

## 2019-01-05 DIAGNOSIS — Z79899 Other long term (current) drug therapy: Secondary | ICD-10-CM | POA: Diagnosis not present

## 2019-01-05 DIAGNOSIS — Z9841 Cataract extraction status, right eye: Secondary | ICD-10-CM | POA: Insufficient documentation

## 2019-01-05 DIAGNOSIS — I8511 Secondary esophageal varices with bleeding: Secondary | ICD-10-CM | POA: Diagnosis not present

## 2019-01-05 DIAGNOSIS — K7031 Alcoholic cirrhosis of liver with ascites: Secondary | ICD-10-CM

## 2019-01-05 DIAGNOSIS — Z8042 Family history of malignant neoplasm of prostate: Secondary | ICD-10-CM | POA: Diagnosis not present

## 2019-01-05 DIAGNOSIS — Z86010 Personal history of colonic polyps: Secondary | ICD-10-CM | POA: Diagnosis not present

## 2019-01-05 DIAGNOSIS — D696 Thrombocytopenia, unspecified: Secondary | ICD-10-CM | POA: Insufficient documentation

## 2019-01-05 DIAGNOSIS — Z888 Allergy status to other drugs, medicaments and biological substances status: Secondary | ICD-10-CM | POA: Insufficient documentation

## 2019-01-05 DIAGNOSIS — K7030 Alcoholic cirrhosis of liver without ascites: Secondary | ICD-10-CM | POA: Insufficient documentation

## 2019-01-05 DIAGNOSIS — F329 Major depressive disorder, single episode, unspecified: Secondary | ICD-10-CM | POA: Insufficient documentation

## 2019-01-05 DIAGNOSIS — Z85828 Personal history of other malignant neoplasm of skin: Secondary | ICD-10-CM | POA: Insufficient documentation

## 2019-01-05 DIAGNOSIS — G479 Sleep disorder, unspecified: Secondary | ICD-10-CM | POA: Insufficient documentation

## 2019-01-05 DIAGNOSIS — Z91030 Bee allergy status: Secondary | ICD-10-CM | POA: Diagnosis not present

## 2019-01-05 DIAGNOSIS — Z8711 Personal history of peptic ulcer disease: Secondary | ICD-10-CM | POA: Diagnosis not present

## 2019-01-05 DIAGNOSIS — M5136 Other intervertebral disc degeneration, lumbar region: Secondary | ICD-10-CM | POA: Insufficient documentation

## 2019-01-05 DIAGNOSIS — Z833 Family history of diabetes mellitus: Secondary | ICD-10-CM | POA: Diagnosis not present

## 2019-01-05 DIAGNOSIS — Z823 Family history of stroke: Secondary | ICD-10-CM | POA: Insufficient documentation

## 2019-01-05 DIAGNOSIS — Z9049 Acquired absence of other specified parts of digestive tract: Secondary | ICD-10-CM | POA: Insufficient documentation

## 2019-01-05 DIAGNOSIS — K3189 Other diseases of stomach and duodenum: Secondary | ICD-10-CM | POA: Insufficient documentation

## 2019-01-05 DIAGNOSIS — K766 Portal hypertension: Secondary | ICD-10-CM

## 2019-01-05 DIAGNOSIS — I8510 Secondary esophageal varices without bleeding: Secondary | ICD-10-CM | POA: Insufficient documentation

## 2019-01-05 DIAGNOSIS — F419 Anxiety disorder, unspecified: Secondary | ICD-10-CM | POA: Diagnosis not present

## 2019-01-05 DIAGNOSIS — K219 Gastro-esophageal reflux disease without esophagitis: Secondary | ICD-10-CM | POA: Insufficient documentation

## 2019-01-05 DIAGNOSIS — F17210 Nicotine dependence, cigarettes, uncomplicated: Secondary | ICD-10-CM | POA: Diagnosis not present

## 2019-01-05 DIAGNOSIS — Z9842 Cataract extraction status, left eye: Secondary | ICD-10-CM | POA: Diagnosis not present

## 2019-01-05 DIAGNOSIS — Z800 Family history of malignant neoplasm of digestive organs: Secondary | ICD-10-CM | POA: Diagnosis not present

## 2019-01-05 HISTORY — DX: Unspecified hearing loss, unspecified ear: H91.90

## 2019-01-05 HISTORY — DX: Other intervertebral disc degeneration, lumbar region: M51.36

## 2019-01-05 HISTORY — DX: Other ill-defined heart diseases: I51.89

## 2019-01-05 HISTORY — PX: ESOPHAGOGASTRODUODENOSCOPY (EGD) WITH PROPOFOL: SHX5813

## 2019-01-05 HISTORY — DX: Other intervertebral disc degeneration, lumbar region without mention of lumbar back pain or lower extremity pain: M51.369

## 2019-01-05 HISTORY — DX: Opioid dependence, uncomplicated: F11.20

## 2019-01-05 SURGERY — ESOPHAGOGASTRODUODENOSCOPY (EGD) WITH PROPOFOL
Anesthesia: Monitor Anesthesia Care

## 2019-01-05 MED ORDER — LIDOCAINE 2% (20 MG/ML) 5 ML SYRINGE
INTRAMUSCULAR | Status: DC | PRN
  Administered 2019-01-05: 60 mg via INTRAVENOUS

## 2019-01-05 MED ORDER — PROPOFOL 500 MG/50ML IV EMUL
INTRAVENOUS | Status: DC | PRN
  Administered 2019-01-05: 150 ug/kg/min via INTRAVENOUS

## 2019-01-05 MED ORDER — PROPOFOL 10 MG/ML IV BOLUS
INTRAVENOUS | Status: DC | PRN
  Administered 2019-01-05: 10 mg via INTRAVENOUS
  Administered 2019-01-05: 20 mg via INTRAVENOUS

## 2019-01-05 MED ORDER — FUROSEMIDE 40 MG PO TABS: 80.0000 mg | ORAL_TABLET | Freq: Every day | ORAL | 1 refills | Status: DC

## 2019-01-05 MED ORDER — PROPOFOL 10 MG/ML IV BOLUS
INTRAVENOUS | Status: AC
  Filled 2019-01-05: qty 40

## 2019-01-05 MED ORDER — SODIUM CHLORIDE 0.9 % IV SOLN: INTRAVENOUS | Status: DC

## 2019-01-05 MED ORDER — ONDANSETRON HCL 4 MG/2ML IJ SOLN
INTRAMUSCULAR | Status: DC | PRN
  Administered 2019-01-05: 4 mg via INTRAVENOUS

## 2019-01-05 MED ORDER — LACTATED RINGERS IV SOLN
INTRAVENOUS | Status: DC
  Administered 2019-01-05: 08:00:00 via INTRAVENOUS

## 2019-01-05 SURGICAL SUPPLY — 15 items

## 2019-01-05 NOTE — Transfer of Care (Signed)
Immediate Anesthesia Transfer of Care Note  Patient: Billy [name]  Procedure(s) Performed: ESOPHAGOGASTRODUODENOSCOPY (EGD) WITH PROPOFOL (N/A )  Patient Location: PACU  Anesthesia Type:MAC  Level of Consciousness: awake, alert  and oriented  Airway & Oxygen Therapy: Patient Spontanous Breathing and Patient connected to nasal cannula oxygen  Post-op Assessment: Report given to RN and Post -op Vital signs reviewed and stable  Post vital signs: Reviewed and stable  Last Vitals:  Vitals Value Taken Time  BP    Temp    Pulse 61 01/05/19 0904  Resp 20 01/05/19 0904  SpO2 99 % 01/05/19 0904  Vitals shown include unvalidated device data.  Last Pain:  Vitals:   01/05/19 0753  TempSrc: Temporal  PainSc: 0-No pain         Complications: No apparent anesthesia complications

## 2019-01-05 NOTE — Op Note (Addendum)
[facility] Patient Name: Billy [name] Procedure Date: 01/05/2019 MRN: [id number] Attending MD: Gatha Mayer , MD Date of Birth: [birth year] CSN: [id number] Age: 73 Admit Type: Outpatient Procedure:                Upper GI endoscopy Indications:              Esophageal varices, Follow-up of esophageal                            varices, For therapy of esophageal varices Providers:                Gatha Mayer, MD, Grace Isaac, RN, Cherylynn Ridges, Technician, Virgia Land, CRNA Referring MD:              Medicines:                Propofol per Anesthesia, Monitored Anesthesia Care Complications:            No immediate complications. Estimated Blood Loss:     Estimated blood loss: none. Procedure:                Pre-Anesthesia Assessment:                           - Prior to the procedure, a History and Physical                            was performed, and patient medications and                            allergies were reviewed. The patient's tolerance of                            previous anesthesia was also reviewed. The risks                            and benefits of the procedure and the sedation                            options and risks were discussed with the patient.                            All questions were answered, and informed consent                            was obtained. Prior Anticoagulants: The patient has                            taken no previous anticoagulant or antiplatelet                            agents. ASA Grade Assessment: III - A patient with  severe systemic disease. After reviewing the risks                            and benefits, the patient was deemed in                            satisfactory condition to undergo the procedure.                           After obtaining informed consent, the endoscope was                            passed under direct vision.  Throughout the                            procedure, the patient's blood pressure, pulse, and                            oxygen saturations were monitored continuously. The                            GIF-H190 ([id number]) Olympus gastroscope was                            introduced through the mouth, and advanced to the                            second part of duodenum. The upper GI endoscopy was                            accomplished without difficulty. The patient                            tolerated the procedure well. Scope In: Scope Out: Findings:      Grade I varices were found in the lower third of the esophagus. They       were 4 mm in largest diameter. Estimated blood loss: none.      Moderate portal hypertensive gastropathy was found in the entire       examined stomach.      Inflammation characterized by polypoid congestion (edema), erythema and       friability was found in the second portion of the duodenum.      The exam was otherwise without abnormality.      The cardia and gastric fundus were normal on retroflexion. Impression:               - Grade I esophageal varices. MAX 4 MM, FLATTENED,                            NO STIGMATA - DID NOT MEET CRITERIA FOR BANDING                            TODAY                           -  Portal hypertensive gastropathy.                           - Duodenitis. EVALUATED BY EUS IN 08/2018 - NOT                            VARICES                           - The examination was otherwise normal.                           - No specimens collected. Moderate Sedation:      Not Applicable - Patient had care per Anesthesia. Recommendation:           - Patient has a contact number available for                            emergencies. The signs and symptoms of potential                            delayed complications were discussed with the                            patient. Return to normal activities tomorrow.                             Written discharge instructions were provided to the                            patient.                           - Resume previous diet.                           - Continue present medications.                           - Repeat upper endoscopy in 6 months for                            surveillance.                           - MY OFFICE WILL SCHEDULE AN [place] OF ABDOMEN -                            COMPLETE TO F/U CIRRHOSIS AND ASCITES                           I HAVE PLACED ORDERS FOR HIM TO DO BMET AND INR                            ON/AROUND 7/20  AM INCREASING FUROSEMIDE TO 80 MG AM, TO HELP                            ASCITES                           WILL LOOK TO SEE IF CAN INCREASE NADOLOL Procedure Code(s):        --- Professional ---                           [phone], Esophagogastroduodenoscopy, flexible,                            transoral; diagnostic, including collection of                            specimen(s) by brushing or washing, when performed                            (separate procedure) Diagnosis Code(s):        --- Professional ---                           I85.00, Esophageal varices without bleeding                           K76.6, Portal hypertension                           K31.89, Other diseases of stomach and duodenum                           K29.80, Duodenitis without bleeding CPT copyright 2019 American Medical Association. All rights reserved. The codes documented in this report are preliminary and upon coder review may  be revised to meet current compliance requirements. Gatha Mayer, MD 01/05/2019 9:14:24 AM This report has been signed electronically. Number of Addenda: 0

## 2019-01-05 NOTE — H&P (Signed)
[facility] History and Physical   Primary Care Physician:  [name], MD   Reason for Procedure:   esophageal varices  Plan:    EGD, possible banding varices     HPI: Billy [name] is a 73 y.o. male w/ cirrhosis and esophageal varices w/ hx bleeding. Here for f/u EGD   Past Medical History:  Diagnosis Date  . Acute posthemorrhagic anemia   . 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 Progressed from fatty liver to cirrhosis based upon review imaging. Manifestations are portal hypertension with esophageal varices, portal gastropathy, rectal varices and he may have portal colopathy. Ascites also present. Naive to HAV and HBV Claims he has had pneumococcal vaccine   . Alcoholism ([place])   . Angiodysplasia of colon 09/11/2011   Multiple in the right colon, ablated with APC. Question if this is portal colopathy.   . Anxiety   . Back pain   . Basal cell carcinoma   . Bilateral varicoceles   . Campylobacter diarrhea 01/07/2012  . Cataract    removed  . DDD (degenerative disc disease), lumbar   . Depression    mild at most  . Diverticulosis of colon   . Erectile dysfunction   . Gastric ulcer 08/2011   small, antral ulcer  . GERD (gastroesophageal reflux disease)    takes Omeprazole prn  . GI hemorrhage 2006, 2013   esophagitis and 1+ varices, melena in 2013   . Grade I diastolic dysfunction   . Hearing loss   . History of ascites   . History of blood transfusion    as a child and no abnormal reaction noted  . History of bronchitis    >35yrs ago  . History of dizziness   . Hydrocele   . Hypertension    takes Nadolol daily  . Narcotic dependence ([place])   . Personal history of colonic polyps   . Portal hypertension ([place]) 08/2011  . 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 Right hydrocele   . Scrotal edema   . Sleep disturbance    takes Remeron nightly  . Splenomegaly   . Thrombocytopenia, unspecified ([place])   . Varices, esophageal  ([place]) 08/2011   Grade 2, mid-esophagus    Past Surgical History:  Procedure Laterality Date  . BIOPSY  08/27/2018   Procedure: BIOPSY;  Surgeon: Milus Banister, MD;  Location: [facility];  Service: Endoscopy;;  . CATARACT EXTRACTION  2013   bilateral with IOL  . CHOLECYSTECTOMY  12/31/2012   Procedure: LAPAROSCOPIC CHOLECYSTECTOMY;  Surgeon: Zenovia Jarred, MD;  Location: [facility];  Service: General;;  . COLONOSCOPY  2001, 200411/02/2006  . COLONOSCOPY  09/11/2011   Procedure: COLONOSCOPY;  Surgeon: Gatha Mayer, MD;  Location: [facility];  Service: Endoscopy;  Laterality: N/A;  . COLONOSCOPY    . ESOPHAGEAL BANDING N/A 05/02/2017   Procedure: ESOPHAGEAL BANDING;  Surgeon: Gatha Mayer, MD;  Location: [facility];  Service: Endoscopy;  Laterality: N/A;  . ESOPHAGEAL BANDING  06/09/2018   Procedure: ESOPHAGEAL BANDING;  Surgeon: Gatha Mayer, MD;  Location: [facility];  Service: Endoscopy;;  . ESOPHAGEAL BANDING  08/27/2018   Procedure: ESOPHAGEAL BANDING;  Surgeon: Milus Banister, MD;  Location: [facility];  Service: Endoscopy;;  . ESOPHAGOGASTRODUODENOSCOPY  2006  . ESOPHAGOGASTRODUODENOSCOPY  09/11/2011   Procedure: ESOPHAGOGASTRODUODENOSCOPY (EGD);  Surgeon: Gatha Mayer, MD;  Location: [facility];  Service: Endoscopy;  Laterality: N/A;  . ESOPHAGOGASTRODUODENOSCOPY N/A 08/27/2018  Procedure: ESOPHAGOGASTRODUODENOSCOPY (EGD);  Surgeon: Milus Banister, MD;  Location: [facility];  Service: Endoscopy;  Laterality: N/A;  . ESOPHAGOGASTRODUODENOSCOPY (EGD) WITH PROPOFOL N/A 04/13/2017   Procedure: ESOPHAGOGASTRODUODENOSCOPY (EGD) WITH PROPOFOL;  Surgeon: Doran Stabler, MD;  Location: [facility];  Service: Gastroenterology;  Laterality: N/A;  . ESOPHAGOGASTRODUODENOSCOPY (EGD) WITH PROPOFOL N/A 05/02/2017   Procedure: ESOPHAGOGASTRODUODENOSCOPY (EGD) WITH PROPOFOL;  Surgeon: Gatha Mayer, MD;  [facility]: [facility];  Service: Endoscopy;  Laterality: N/A;  .  ESOPHAGOGASTRODUODENOSCOPY (EGD) WITH PROPOFOL N/A 09/30/2017   Procedure: ESOPHAGOGASTRODUODENOSCOPY (EGD) WITH PROPOFOL;  Surgeon: Gatha Mayer, MD;  [facility]: [facility];  Service: Endoscopy;  Laterality: N/A;  . ESOPHAGOGASTRODUODENOSCOPY (EGD) WITH PROPOFOL N/A 06/09/2018   Procedure: ESOPHAGOGASTRODUODENOSCOPY (EGD) WITH PROPOFOL;  Surgeon: Gatha Mayer, MD;  Location: [facility];  Service: Endoscopy;  Laterality: N/A;  . EUS N/A 08/27/2018   Procedure: UPPER ENDOSCOPIC ULTRASOUND (EUS) RADIAL;  Surgeon: Milus Banister, MD;  Location: [facility];  Service: Endoscopy;  Laterality: N/A;  . EYE SURGERY    . FLEXIBLE SIGMOIDOSCOPY  01/07/2012   Procedure: FLEXIBLE SIGMOIDOSCOPY;  Surgeon: Lafayette Dragon, MD;  Location: [facility];  Service: Endoscopy;  Laterality: N/A;  . KNEE ARTHROSCOPY  2008   Right  . KNEE ARTHROSCOPY  2008   Left  . POLYPECTOMY    . [place] THORA/PARACENTESIS  10/28/2011  . Varicocele repair  1980's    Prior to Admission medications   Medication Sig Start Date End Date Taking? Authorizing Provider  amoxicillin (AMOXIL) 875 MG tablet Take 875 mg by mouth 2 (two) times daily.   Yes [provider]  buPROPion (WELLBUTRIN SR) 150 MG 12 hr tablet TAKE 1 TABLET BY MOUTH EVERY DAY Patient taking differently: Take 150 mg by mouth daily.  06/15/18  Yes [name], MD  cholecalciferol (VITAMIN D3) 25 MCG (1000 UT) tablet Take 1,000 Units by mouth at bedtime.    Yes [provider]  ferrous sulfate 325 (65 FE) MG EC tablet Take 325 mg by mouth every other day.   Yes [provider]  fluocinonide cream (LIDEX) 8.31 % Apply 1 application topically 2 (two) times daily as needed (for skin rash/irritation). APPLY ON THE SKIN TWICE A DAY AS NEEDED FOR RASH 12/30/18  Yes [name], MD  furosemide (LASIX) 20 MG tablet Take 3 tablets (60 mg total) by mouth every morning. Patient taking differently: Take 60 mg by mouth daily.  04/16/18  Yes  Gatha Mayer, MD  HYDROcodone-acetaminophen (NORCO/VICODIN) 5-325 MG tablet Take 1 tablet by mouth 3 (three) times daily as needed (for pain.). 12/14/18  Yes [name], MD  loperamide (IMODIUM A-D) 2 MG tablet Take 2 mg by mouth as needed for diarrhea or loose stools.   Yes [provider]  mirtazapine (REMERON) 45 MG tablet TAKE 1 TABLET (45 MG TOTAL) BY MOUTH AT BEDTIME. 06/22/18  Yes [name], MD  Multiple Vitamin (MULTIVITAMIN WITH MINERALS) TABS Take 1 tablet by mouth daily.    Yes [provider]  nadolol (CORGARD) 40 MG tablet TAKE 1 TABLET (40 MG TOTAL) BY MOUTH DAILY. 10/22/18  Yes Ladene Artist, MD  omeprazole (PRILOSEC) 40 MG capsule TAKE 1 CAPSULE BY MOUTH TWICE A DAY Patient taking differently: Take 40 mg by mouth 2 (two) times daily.  09/25/18  Yes [name], MD  PREVIDENT 5000 BOOSTER PLUS 1.1 % PSTE Place 1 application onto teeth 2 (two) times daily.  03/30/18  Yes [provider]  psyllium (METAMUCIL) 58.6 % powder Take 1 packet by mouth at bedtime.    Yes [provider]  spironolactone (ALDACTONE) 50 MG tablet Take 4 tablets (200 mg total) by mouth daily. 11/16/18  Yes Gatha Mayer, MD  vitamin C (ASCORBIC ACID) 250 MG tablet Take 250 mg by mouth daily.    Yes [provider]    Current Facility-Administered Medications  Medication Dose Route Frequency Provider Last Rate Last Dose  . 0.9 %  sodium chloride infusion   Intravenous Continuous Gatha Mayer, MD      . lactated ringers infusion   Intravenous Continuous Gatha Mayer, MD 20 mL/hr at 01/05/19 0802      Allergies as of 12/03/2018 - Review Complete 10/12/2018  Allergen Reaction Noted  . Yellow jacket venom Anaphylaxis, Shortness Of Breath, and Other (See Comments) 04/28/2017  . Nsaids Other (See Comments) 12/21/2012    Family History  Problem Relation Age of Onset  . Stroke Mother   . Diabetes Mother   . Lung cancer Brother   . Prostate  cancer Brother   . Liver cancer Cousin   . Liver cancer Paternal Uncle   . Colon cancer Neg Hx   . Stomach cancer Neg Hx   . Esophageal cancer Neg Hx   . Colon polyps Neg Hx   . Rectal cancer Neg Hx   . Pancreatic cancer Neg Hx     Social History   Socioeconomic History  . Marital status: Married    Spouse name: Not on file  . Number of children: 1  . Years of education: Not on file  . Highest education level: Not on file  Occupational History  . Occupation: Retired    Comment: mostly from Research officer, political party Needs  . Financial resource strain: Not on file  . Food insecurity    Worry: Not on file    Inability: Not on file  . Transportation needs    Medical: Not on file    Non-medical: Not on file  Tobacco Use  . Smoking status: Light Tobacco Smoker    Packs/day: 0.50    Types: Cigarettes, E-cigarettes  . Smokeless tobacco: Never Used  Substance and Sexual Activity  . Alcohol use: Not Currently    Alcohol/week: 0.0 standard drinks  . Drug use: Yes    Types: Marijuana    Comment: occasionally, last time couple of weeks ago  . Sexual activity: Never  Lifestyle  . Physical activity    Days per week: Not on file    Minutes per session: Not on file  . Stress: Not on file  Relationships  . Social Herbalist on phone: Not on file    Gets together: Not on file    Attends religious service: Not on file    Active member of club or organization: Not on file    Attends meetings of clubs or organizations: Not on file    Relationship status: Not on file  . Intimate partner violence    Fear of current or ex partner: Not on file    Emotionally abused: Not on file    Physically abused: Not on file    Forced sexual activity: Not on file  Other Topics Concern  . Not on file  Social History Narrative   Married- 2nd   1 son (adopted) from previous marriage      Has living will   Wife is  health care  POA---alternate is son   Would accept attempts at resuscitation    No tube feeds if cognitively unaware       Review of Systems:  All other review of systems negative except as mentioned in the HPI.  Physical Exam: Vital signs in last 24 hours: Temp:  [97.7 F (36.5 C)] 97.7 F (36.5 C) (06/23 0753) Pulse Rate:  [66] 66 (06/23 0753) Resp:  [16] 16 (06/23 0753) BP: (134)/(56) 134/56 (06/23 0753) SpO2:  [100 %] 100 % (06/23 0753) Weight:  [77.1 kg] 77.1 kg (06/23 0753)   General:   Alert,  Well-developed, well-nourished, pleasant and cooperative in NAD Lungs:  Clear throughout to auscultation.   Heart:  Regular rate and rhythm; no murmurs, clicks, rubs,  or gallops. Abdomen:  Soft, nontender and mildly distended w/ ascites. Normal bowel sounds.   Neuro/Psych:  Alert and cooperative. Normal mood and affect. A and O x 3   @Kyaira Trantham  Simonne Maffucci, MD, [facility] [phone] (pager) 01/05/2019 8:38 AM@

## 2019-01-05 NOTE — Anesthesia Preprocedure Evaluation (Addendum)
Anesthesia Evaluation  Patient identified by MRN, date of birth, ID band Patient awake    Reviewed: Allergy & Precautions, NPO status , Patient's Chart, lab work & pertinent test results  Airway Mallampati: II  TM Distance: >3 FB Neck ROM: Full    Dental no notable dental hx.    Pulmonary neg pulmonary ROS, Current Smoker,    Pulmonary exam normal breath sounds clear to auscultation       Cardiovascular hypertension, Pt. on medications Normal cardiovascular exam Rhythm:Regular Rate:Normal     Neuro/Psych negative neurological ROS  negative psych ROS   GI/Hepatic GERD  ,(+) Cirrhosis   Esophageal Varices    ,   Endo/Other  negative endocrine ROS  Renal/GU negative Renal ROS  negative genitourinary   Musculoskeletal negative musculoskeletal ROS (+)   Abdominal   Peds negative pediatric ROS (+)  Hematology negative hematology ROS (+)   Anesthesia Other Findings   Reproductive/Obstetrics negative OB ROS                            Anesthesia Physical Anesthesia Plan  ASA: III  Anesthesia Plan: MAC   Post-op Pain Management:    Induction: Intravenous  PONV Risk Score and Plan: 0  Airway Management Planned: Simple Face Mask  Additional Equipment:   Intra-op Plan:   Post-operative Plan:   Informed Consent: I have reviewed the patients History and Physical, chart, labs and discussed the procedure including the risks, benefits and alternatives for the proposed anesthesia with the patient or authorized representative who has indicated his/her understanding and acceptance.     Dental advisory given  Plan Discussed with: CRNA and Surgeon  Anesthesia Plan Comments:         Anesthesia Quick Evaluation

## 2019-01-05 NOTE — Discharge Instructions (Signed)
° ° °  The varices did not need banding today. I want to recheck in about 6 months.  I want you to increase furosemide to 80 mg every AM to help with fluid.  We need to set up an ultrasound of the liver to recheck it again. My office will do that.  Will recheck labs in 1 month.  I appreciate the opportunity to care for you. Gatha Mayer, MD, FACG  YOU HAD AN ENDOSCOPIC PROCEDURE TODAY: Refer to the procedure report and other information in the discharge instructions given to you for any specific questions about what was found during the examination. If this information does not answer your questions, please call Dr. Celesta Aver office at [phone] to clarify.   YOU SHOULD EXPECT: Some feelings of bloating in the abdomen. Passage of more gas than usual. Walking can help get rid of the air that was put into your GI tract during the procedure and reduce the bloating. If you had a lower endoscopy (such as a colonoscopy or flexible sigmoidoscopy) you may notice spotting of blood in your stool or on the toilet paper. Some abdominal soreness may be present for a day or two, also.  DIET: Your first meal following the procedure should be a light meal and then it is ok to progress to your normal diet. A half-sandwich or bowl of soup is an example of a good first meal. Heavy or fried foods are harder to digest and may make you feel nauseous or bloated. Drink plenty of fluids but you should avoid alcoholic beverages for 24 hours.   ACTIVITY: Your care partner should take you home directly after the procedure. You should plan to take it easy, moving slowly for the rest of the day. You can resume normal activity the day after the procedure however YOU SHOULD NOT DRIVE, use power tools, machinery or perform tasks that involve climbing or major physical exertion for 24 hours (because of the sedation medicines used during the test).   SYMPTOMS TO REPORT IMMEDIATELY: A gastroenterologist can be reached at  any hour. Please call [phone]  for any of the following symptoms:   Following upper endoscopy (EGD, EUS, ERCP, esophageal dilation) Vomiting of blood or coffee ground material  New, significant abdominal pain  New, significant chest pain or pain under the shoulder blades  Painful or persistently difficult swallowing  New shortness of breath  Black, tarry-looking or red, bloody stools

## 2019-01-05 NOTE — Anesthesia Procedure Notes (Addendum)
Procedure Name: MAC Date/Time: 01/05/2019 8:45 AM Performed by: Maxwell Caul, CRNA Pre-anesthesia Checklist: Patient identified, Emergency Drugs available, Suction available, Patient being monitored and Timeout performed Oxygen Delivery Method: Nasal cannula

## 2019-01-05 NOTE — Anesthesia Postprocedure Evaluation (Signed)
Anesthesia Post Note  Patient: Billy [name]  Procedure(s) Performed: ESOPHAGOGASTRODUODENOSCOPY (EGD) WITH PROPOFOL (N/A )     Patient location during evaluation: PACU Anesthesia Type: MAC Level of consciousness: awake and alert Pain management: pain level controlled Vital Signs Assessment: post-procedure vital signs reviewed and stable Respiratory status: spontaneous breathing, nonlabored ventilation, respiratory function stable and patient connected to nasal cannula oxygen Cardiovascular status: stable and blood pressure returned to baseline Postop Assessment: no apparent nausea or vomiting Anesthetic complications: no    Last Vitals:  Vitals:   01/05/19 0914 01/05/19 0920  BP: (!) 121/58 134/64  Pulse: (!) 59 60  Resp: 19 14  Temp:    SpO2: 100% 100%    Last Pain:  Vitals:   01/05/19 0920  TempSrc:   PainSc: 0-No pain                 [name]

## 2019-01-07 ENCOUNTER — Encounter (HOSPITAL_COMMUNITY): Payer: Self-pay | Admitting: Internal Medicine

## 2019-01-11 ENCOUNTER — Telehealth: Payer: Self-pay

## 2019-01-11 ENCOUNTER — Other Ambulatory Visit: Payer: Self-pay

## 2019-01-11 MED ORDER — HYDROCODONE-ACETAMINOPHEN 5-325 MG PO TABS: 1.0000 | ORAL_TABLET | Freq: Three times a day (TID) | ORAL | 0 refills | Status: DC | PRN

## 2019-01-11 NOTE — Telephone Encounter (Signed)
-----   Message from Gatha Mayer, MD sent at 01/05/2019  9:14 AM EDT ----- Regarding: Needs [place] - had EGD todaty Please contact patient tomorrow or later this week and order complete abdominal ultrasound   Dx alcoholic cirrhosis w/ ascites

## 2019-01-11 NOTE — Telephone Encounter (Signed)
Patient notified of the [place] scheduled for [facility] 01/14/19 9:15 arrival for 9:30.  He is asked to be NPO after midnight

## 2019-01-11 NOTE — Telephone Encounter (Signed)
Let him know that we have spoiled him---it can take up to 48 hours to refill medications (though I usually try to do them fairly quickly

## 2019-01-11 NOTE — Telephone Encounter (Signed)
Left message for patient to call back  

## 2019-01-11 NOTE — Telephone Encounter (Signed)
Name of Medication: Hydrocodone Name of Pharmacy: [facility] or Written Date and Quantity: 12-14-18 #90 Last Office Visit and Type: 12-30-18 Next Office Visit and Type: 04-02-19 Last Controlled Substance Agreement Date: 09-08-18 Last UDS: 09-08-18

## 2019-01-11 NOTE — Telephone Encounter (Signed)
Patient left a voicemail stating that the refill has taken longer today than usual.

## 2019-01-12 NOTE — Telephone Encounter (Signed)
Spoke to pt. He said he understands.

## 2019-01-14 ENCOUNTER — Ambulatory Visit (HOSPITAL_COMMUNITY)
Admission: RE | Admit: 2019-01-14 | Discharge: 2019-01-14 | Disposition: A | Discharge status: Home / Self Care | Payer: 59 | Source: Ambulatory Visit | Attending: Internal Medicine | Admitting: Internal Medicine

## 2019-01-14 ENCOUNTER — Other Ambulatory Visit: Payer: Self-pay

## 2019-01-14 DIAGNOSIS — K7031 Alcoholic cirrhosis of liver with ascites: Secondary | ICD-10-CM | POA: Diagnosis present

## 2019-01-16 NOTE — Progress Notes (Signed)
US shows stable cirrhosis an d slight ascites Recall [place] 6 mos Recall EGD 6 mos He has labs around 7/20 - already ordered - please arrange a BP and pulse check at that time as I might try to increase nadolol to reduce risk of more variceal bleeding

## 2019-02-01 ENCOUNTER — Ambulatory Visit: Payer: 59 | Admitting: Internal Medicine

## 2019-02-01 ENCOUNTER — Other Ambulatory Visit: Payer: Self-pay

## 2019-02-01 ENCOUNTER — Other Ambulatory Visit (INDEPENDENT_AMBULATORY_CARE_PROVIDER_SITE_OTHER): Payer: 59

## 2019-02-01 ENCOUNTER — Encounter: Payer: Self-pay | Admitting: Internal Medicine

## 2019-02-01 VITALS — BP 110/58 | HR 64

## 2019-02-01 DIAGNOSIS — K7031 Alcoholic cirrhosis of liver with ascites: Secondary | ICD-10-CM

## 2019-02-01 LAB — BASIC METABOLIC PANEL
BUN: 27 mg/dL — ABNORMAL HIGH (ref 6–23)
CO2: 30 mEq/L (ref 19–32)
Calcium: 9.4 mg/dL (ref 8.4–10.5)
Chloride: 96 mEq/L (ref 96–112)
Creatinine, Ser: 1.45 mg/dL (ref 0.40–1.50)
GFR: 47.99 mL/min — ABNORMAL LOW (ref >60.00)
Glucose, Bld: 137 mg/dL — ABNORMAL HIGH (ref 70–99)
Potassium: 5.7 mEq/L — ABNORMAL HIGH (ref 3.5–5.1)
Sodium: 133 mEq/L — ABNORMAL LOW (ref 135–145)

## 2019-02-01 LAB — PROTIME-INR
INR: 1.2 ratio — ABNORMAL HIGH (ref 0.8–1.0)
Prothrombin Time: 14 s — ABNORMAL HIGH (ref 9.6–13.1)

## 2019-02-02 ENCOUNTER — Other Ambulatory Visit: Payer: Self-pay | Admitting: Internal Medicine

## 2019-02-02 NOTE — Progress Notes (Signed)
K is a bit high Last [place] still had some ascites  Change meds as follows  Make furosemide 100 mg daily so Rx 20 mg daily to take with the 2x 40 mg = 100 mg total  BMET in 1 month dc cirrhiosis w/ ascites

## 2019-02-03 ENCOUNTER — Other Ambulatory Visit: Payer: Self-pay

## 2019-02-03 DIAGNOSIS — K7031 Alcoholic cirrhosis of liver with ascites: Secondary | ICD-10-CM

## 2019-02-03 MED ORDER — FUROSEMIDE 20 MG PO TABS: 20.0000 mg | ORAL_TABLET | Freq: Every day | ORAL | 1 refills | Status: DC

## 2019-02-07 NOTE — Progress Notes (Signed)
BP and pulse noted No change in Nadolol dose Let him hom know

## 2019-02-08 ENCOUNTER — Other Ambulatory Visit: Payer: Self-pay

## 2019-02-08 MED ORDER — HYDROCODONE-ACETAMINOPHEN 5-325 MG PO TABS: 1.0000 | ORAL_TABLET | Freq: Three times a day (TID) | ORAL | 0 refills | Status: DC | PRN

## 2019-02-08 NOTE — Telephone Encounter (Signed)
Name of Medication: Hydrocodone Name of Pharmacy: [facility] or Written Date and Quantity: 01/11/2019 #90 Last Office Visit and Type: 12-30-18 Next Office Visit and Type: 04-02-19 Last Controlled Substance Agreement Date: 09-08-18 Last UDS: 09-08-18

## 2019-02-08 NOTE — Progress Notes (Signed)
Patient informed. 

## 2019-03-08 ENCOUNTER — Other Ambulatory Visit: Payer: Self-pay | Admitting: Internal Medicine

## 2019-03-08 ENCOUNTER — Telehealth: Payer: Self-pay

## 2019-03-08 MED ORDER — FUROSEMIDE 20 MG PO TABS: 20.0000 mg | ORAL_TABLET | Freq: Every day | ORAL | 1 refills | Status: DC

## 2019-03-08 MED ORDER — HYDROCODONE-ACETAMINOPHEN 5-325 MG PO TABS: 1.0000 | ORAL_TABLET | Freq: Three times a day (TID) | ORAL | 0 refills | Status: DC | PRN

## 2019-03-08 NOTE — Telephone Encounter (Signed)
Please advise , [facility]? Sig 20mg  daily Sir.

## 2019-03-08 NOTE — Telephone Encounter (Signed)
Yes - he takes 2 40 mg and 1 20 mg daily to total 100 so do the 20 for 3 mos w/ 1 refill

## 2019-03-08 NOTE — Telephone Encounter (Signed)
Name of Medication: Hydrocodone apap 5-325 mg Name of Pharmacy: [facility] or Written Date and Quantity:# 32 on 02/08/19  Last Office Visit and Type:12/30/18 annual exam Next Office Visit and Type: 04/02/19 for 3 mth FU Last Controlled Substance Agreement Date:09/14/18  Last UDS:09/22/18

## 2019-03-08 NOTE — Telephone Encounter (Signed)
Refilled the furosemide 20mg  as approved.

## 2019-03-10 ENCOUNTER — Other Ambulatory Visit (INDEPENDENT_AMBULATORY_CARE_PROVIDER_SITE_OTHER): Payer: 59

## 2019-03-10 DIAGNOSIS — K7031 Alcoholic cirrhosis of liver with ascites: Secondary | ICD-10-CM | POA: Diagnosis not present

## 2019-03-10 LAB — BASIC METABOLIC PANEL
BUN: 22 mg/dL (ref 6–23)
CO2: 32 mEq/L (ref 19–32)
Calcium: 9 mg/dL (ref 8.4–10.5)
Chloride: 97 mEq/L (ref 96–112)
Creatinine, Ser: 1.3 mg/dL (ref 0.40–1.50)
GFR: 54.42 mL/min — ABNORMAL LOW (ref >60.00)
Glucose, Bld: 122 mg/dL — ABNORMAL HIGH (ref 70–99)
Potassium: 4.6 mEq/L (ref 3.5–5.1)
Sodium: 136 mEq/L (ref 135–145)

## 2019-03-11 NOTE — Progress Notes (Signed)
Billy [name],  Labs are ok with the increased iuretics.  I hope fluid problems are less.  If I haven't mentioned before it is a good idea to weigh every morning and if weight increasing to let me know.  Please respond and let me know how things are going.  Best regards,  Gatha Mayer, MD, [facility]

## 2019-03-29 ENCOUNTER — Telehealth: Payer: Self-pay

## 2019-03-29 DIAGNOSIS — M7989 Other specified soft tissue disorders: Secondary | ICD-10-CM

## 2019-03-29 NOTE — Telephone Encounter (Signed)
Gatha Mayer, MD sent to [name], RN        He needs to get a Doppler LE ultrasound to check for blood clots (DVT) today or tomorrow   If worse needs to go to ED   Previous Messages    Left message for patient to call back.  Doppler US scheduled for tomorrow at 9:00.  I sent him a message to be at [facility] admitting at 8:45.

## 2019-03-30 ENCOUNTER — Other Ambulatory Visit: Payer: Self-pay

## 2019-03-30 ENCOUNTER — Encounter (HOSPITAL_COMMUNITY): Payer: Self-pay

## 2019-03-30 ENCOUNTER — Ambulatory Visit (HOSPITAL_BASED_OUTPATIENT_CLINIC_OR_DEPARTMENT_OTHER)
Admission: RE | Admit: 2019-03-30 | Discharge: 2019-03-30 | Disposition: A | Discharge status: Home / Self Care | Payer: 59 | Source: Ambulatory Visit | Attending: Internal Medicine | Admitting: Internal Medicine

## 2019-03-30 ENCOUNTER — Observation Stay (HOSPITAL_COMMUNITY)
Admission: EM | Admit: 2019-03-30 | Discharge: 2019-03-31 | Disposition: A | Discharge status: Home / Self Care | Payer: 59 | Attending: Internal Medicine | Admitting: Internal Medicine

## 2019-03-30 DIAGNOSIS — F17210 Nicotine dependence, cigarettes, uncomplicated: Secondary | ICD-10-CM | POA: Insufficient documentation

## 2019-03-30 DIAGNOSIS — G479 Sleep disorder, unspecified: Secondary | ICD-10-CM | POA: Diagnosis not present

## 2019-03-30 DIAGNOSIS — I82451 Acute embolism and thrombosis of right peroneal vein: Secondary | ICD-10-CM | POA: Diagnosis not present

## 2019-03-30 DIAGNOSIS — F32A Depression, unspecified: Secondary | ICD-10-CM

## 2019-03-30 DIAGNOSIS — Z86718 Personal history of other venous thrombosis and embolism: Secondary | ICD-10-CM | POA: Insufficient documentation

## 2019-03-30 DIAGNOSIS — K7030 Alcoholic cirrhosis of liver without ascites: Secondary | ICD-10-CM | POA: Diagnosis not present

## 2019-03-30 DIAGNOSIS — N189 Chronic kidney disease, unspecified: Secondary | ICD-10-CM | POA: Insufficient documentation

## 2019-03-30 DIAGNOSIS — Z79899 Other long term (current) drug therapy: Secondary | ICD-10-CM | POA: Insufficient documentation

## 2019-03-30 DIAGNOSIS — Z23 Encounter for immunization: Secondary | ICD-10-CM | POA: Insufficient documentation

## 2019-03-30 DIAGNOSIS — D61818 Other pancytopenia: Secondary | ICD-10-CM | POA: Insufficient documentation

## 2019-03-30 DIAGNOSIS — I82421 Acute embolism and thrombosis of right iliac vein: Principal | ICD-10-CM | POA: Insufficient documentation

## 2019-03-30 DIAGNOSIS — N179 Acute kidney failure, unspecified: Secondary | ICD-10-CM | POA: Insufficient documentation

## 2019-03-30 DIAGNOSIS — I824Y1 Acute embolism and thrombosis of unspecified deep veins of right proximal lower extremity: Secondary | ICD-10-CM | POA: Diagnosis present

## 2019-03-30 DIAGNOSIS — Z886 Allergy status to analgesic agent status: Secondary | ICD-10-CM | POA: Insufficient documentation

## 2019-03-30 DIAGNOSIS — K766 Portal hypertension: Secondary | ICD-10-CM | POA: Diagnosis not present

## 2019-03-30 DIAGNOSIS — I82431 Acute embolism and thrombosis of right popliteal vein: Secondary | ICD-10-CM | POA: Diagnosis not present

## 2019-03-30 DIAGNOSIS — I82441 Acute embolism and thrombosis of right tibial vein: Secondary | ICD-10-CM | POA: Diagnosis not present

## 2019-03-30 DIAGNOSIS — I129 Hypertensive chronic kidney disease with stage 1 through stage 4 chronic kidney disease, or unspecified chronic kidney disease: Secondary | ICD-10-CM | POA: Insufficient documentation

## 2019-03-30 DIAGNOSIS — Z20828 Contact with and (suspected) exposure to other viral communicable diseases: Secondary | ICD-10-CM | POA: Diagnosis not present

## 2019-03-30 DIAGNOSIS — M7989 Other specified soft tissue disorders: Secondary | ICD-10-CM

## 2019-03-30 DIAGNOSIS — F419 Anxiety disorder, unspecified: Secondary | ICD-10-CM | POA: Diagnosis not present

## 2019-03-30 DIAGNOSIS — I82411 Acute embolism and thrombosis of right femoral vein: Secondary | ICD-10-CM | POA: Diagnosis present

## 2019-03-30 DIAGNOSIS — R7302 Impaired glucose tolerance (oral): Secondary | ICD-10-CM | POA: Diagnosis not present

## 2019-03-30 DIAGNOSIS — D539 Nutritional anemia, unspecified: Secondary | ICD-10-CM | POA: Insufficient documentation

## 2019-03-30 DIAGNOSIS — I82491 Acute embolism and thrombosis of other specified deep vein of right lower extremity: Secondary | ICD-10-CM | POA: Insufficient documentation

## 2019-03-30 DIAGNOSIS — I8510 Secondary esophageal varices without bleeding: Secondary | ICD-10-CM | POA: Insufficient documentation

## 2019-03-30 DIAGNOSIS — K219 Gastro-esophageal reflux disease without esophagitis: Secondary | ICD-10-CM | POA: Insufficient documentation

## 2019-03-30 DIAGNOSIS — Z85828 Personal history of other malignant neoplasm of skin: Secondary | ICD-10-CM | POA: Diagnosis not present

## 2019-03-30 DIAGNOSIS — F329 Major depressive disorder, single episode, unspecified: Secondary | ICD-10-CM | POA: Insufficient documentation

## 2019-03-30 DIAGNOSIS — K2100 Gastro-esophageal reflux disease with esophagitis, without bleeding: Secondary | ICD-10-CM

## 2019-03-30 DIAGNOSIS — I82401 Acute embolism and thrombosis of unspecified deep veins of right lower extremity: Secondary | ICD-10-CM

## 2019-03-30 DIAGNOSIS — Z8042 Family history of malignant neoplasm of prostate: Secondary | ICD-10-CM | POA: Insufficient documentation

## 2019-03-30 HISTORY — DX: Acute embolism and thrombosis of right femoral vein: I82.411

## 2019-03-30 LAB — IRON AND TIBC
Iron: 77 ug/dL (ref 45–182)
Saturation Ratios: 23 % (ref 17.9–39.5)
TIBC: 329 ug/dL (ref 250–450)
UIBC: 252 ug/dL

## 2019-03-30 LAB — HEPARIN LEVEL (UNFRACTIONATED): Heparin Unfractionated: 0.29 IU/mL — ABNORMAL LOW (ref 0.30–0.70)

## 2019-03-30 LAB — COMPREHENSIVE METABOLIC PANEL
ALT: 16 U/L (ref 0–44)
AST: 36 U/L (ref 15–41)
Albumin: 3.4 g/dL — ABNORMAL LOW (ref 3.5–5.0)
Alkaline Phosphatase: 71 U/L (ref 38–126)
Anion gap: 10 (ref 5–15)
BUN: 34 mg/dL — ABNORMAL HIGH (ref 8–23)
CO2: 27 mmol/L (ref 22–32)
Calcium: 9 mg/dL (ref 8.9–10.3)
Chloride: 98 mmol/L (ref 98–111)
Creatinine, Ser: 1.77 mg/dL — ABNORMAL HIGH (ref 0.61–1.24)
GFR calc Af Amer: 44 mL/min — ABNORMAL LOW (ref >60)
GFR calc non Af Amer: 38 mL/min — ABNORMAL LOW (ref >60)
Glucose, Bld: 109 mg/dL — ABNORMAL HIGH (ref 70–99)
Potassium: 4.5 mmol/L (ref 3.5–5.1)
Sodium: 135 mmol/L (ref 135–145)
Total Bilirubin: 1.7 mg/dL — ABNORMAL HIGH (ref 0.3–1.2)
Total Protein: 7.6 g/dL (ref 6.5–8.1)

## 2019-03-30 LAB — CBC WITH DIFFERENTIAL/PLATELET
Abs Immature Granulocytes: 0.04 10*3/uL (ref 0.00–0.07)
Basophils Absolute: 0 10*3/uL (ref 0.0–0.1)
Basophils Relative: 1 %
Eosinophils Absolute: 0.2 10*3/uL (ref 0.0–0.5)
Eosinophils Relative: 3 %
HCT: 37 % — ABNORMAL LOW (ref 39.0–52.0)
Hemoglobin: 12.1 g/dL — ABNORMAL LOW (ref 13.0–17.0)
Immature Granulocytes: 1 %
Lymphocytes Relative: 8 %
Lymphs Abs: 0.6 10*3/uL — ABNORMAL LOW (ref 0.7–4.0)
MCH: 34 pg (ref 26.0–34.0)
MCHC: 32.7 g/dL (ref 30.0–36.0)
MCV: 103.9 fL — ABNORMAL HIGH (ref 80.0–100.0)
Monocytes Absolute: 0.8 10*3/uL (ref 0.1–1.0)
Monocytes Relative: 12 %
Neutro Abs: 5.3 10*3/uL (ref 1.7–7.7)
Neutrophils Relative %: 75 %
Platelets: 111 10*3/uL — ABNORMAL LOW (ref 150–400)
RBC: 3.56 MIL/uL — ABNORMAL LOW (ref 4.22–5.81)
RDW: 14.2 % (ref 11.5–15.5)
WBC: 7 10*3/uL (ref 4.0–10.5)
nRBC: 0 % (ref 0.0–0.2)

## 2019-03-30 LAB — PROTIME-INR
INR: 1.3 — ABNORMAL HIGH (ref 0.8–1.2)
Prothrombin Time: 15.6 seconds — ABNORMAL HIGH (ref 11.4–15.2)

## 2019-03-30 LAB — VITAMIN B12: Vitamin B-12: 662 pg/mL (ref 180–914)

## 2019-03-30 LAB — FOLATE: Folate: 21.5 ng/mL (ref >5.9)

## 2019-03-30 LAB — RETICULOCYTES
Immature Retic Fract: 15.5 % (ref 2.3–15.9)
RBC.: 3.46 MIL/uL — ABNORMAL LOW (ref 4.22–5.81)
Retic Count, Absolute: 83.4 10*3/uL (ref 19.0–186.0)
Retic Ct Pct: 2.4 % (ref 0.4–3.1)

## 2019-03-30 LAB — FERRITIN: Ferritin: 49 ng/mL (ref 24–336)

## 2019-03-30 LAB — SARS CORONAVIRUS 2 BY RT PCR (HOSPITAL ORDER, PERFORMED IN ~~LOC~~ HOSPITAL LAB): SARS Coronavirus 2: NEGATIVE

## 2019-03-30 IMAGING — DX DG CHEST 1V PORT
1 series · 1 of 1 positions shown · non-contrast
Comparison: 09/02/2013

CLINICAL DATA: Pt c/o hemoptysis and blood in stool with two
episodes of each since last night. Hx of cirrhosis and esophageal
varices.

EXAM:
PORTABLE CHEST 1 VIEW

[chest ap]
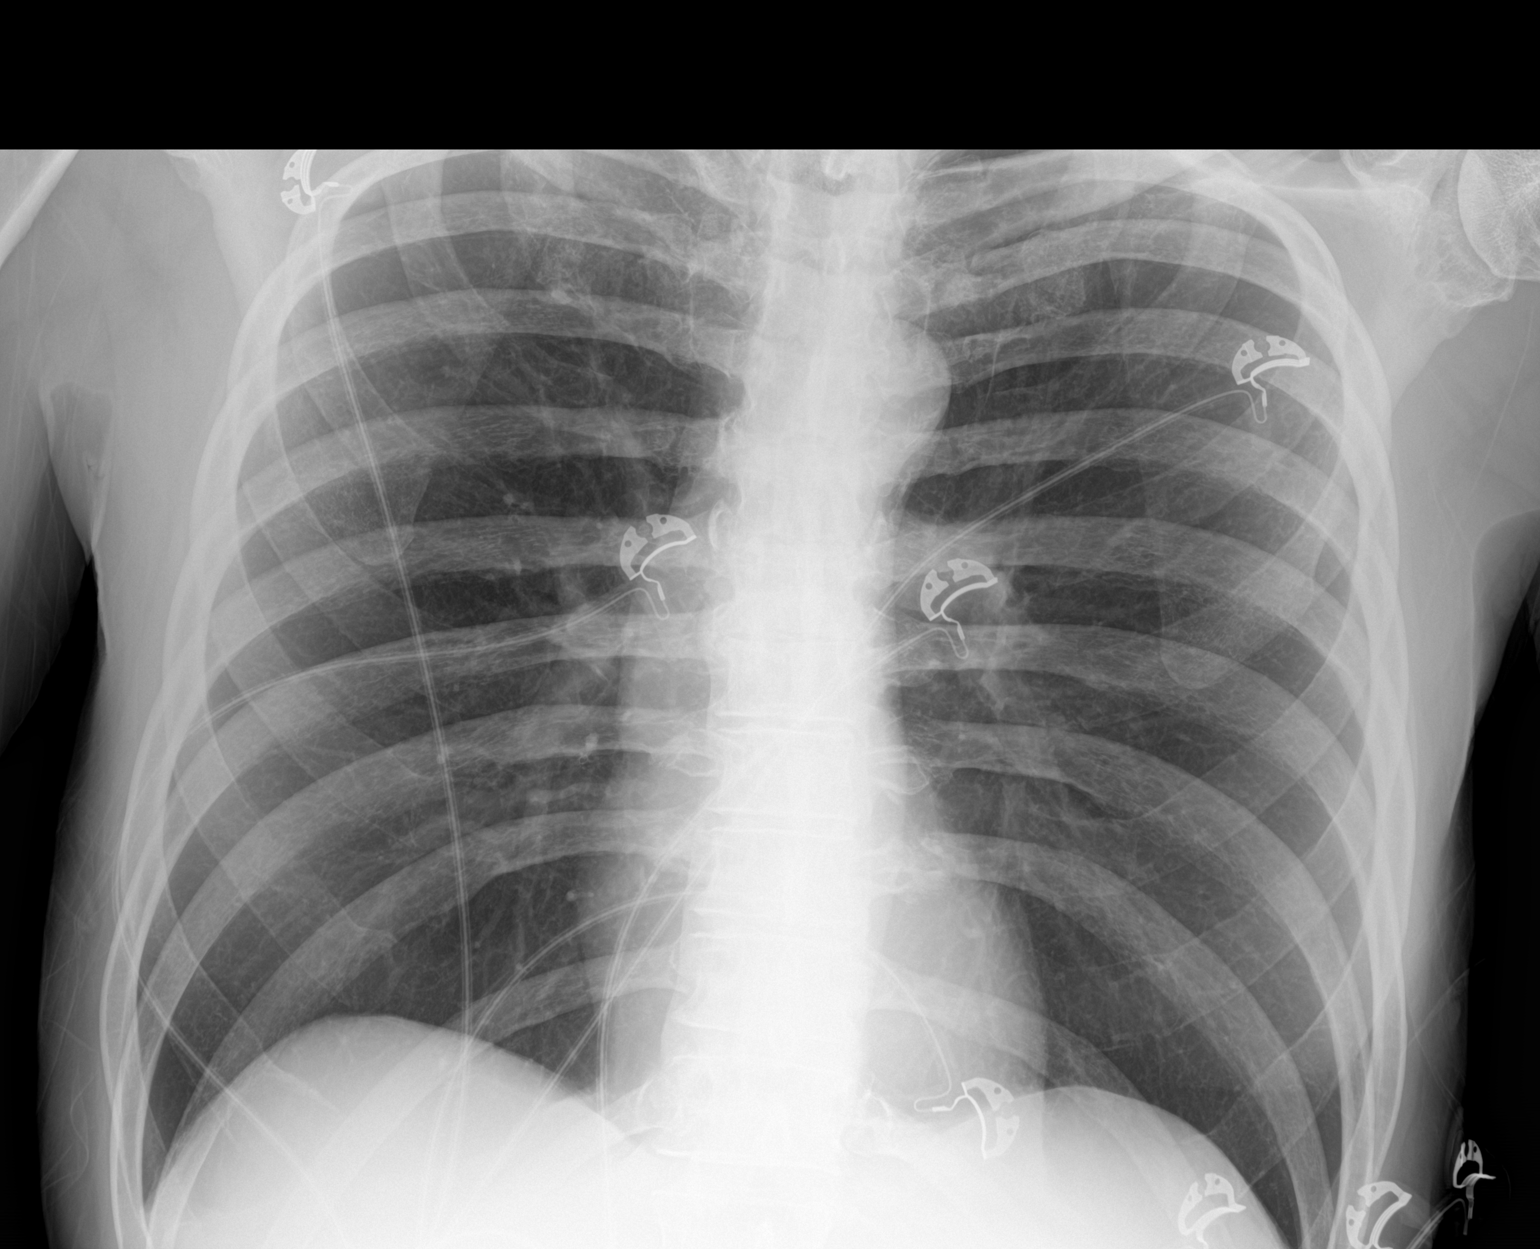

[1 of 1 positions shown; findings below may reference images not displayed]

FINDINGS: The heart size and mediastinal contours are within normal limits.
Both lungs are clear. The visualized skeletal structures are
unremarkable.
IMPRESSION: No active disease.

## 2019-03-30 MED ORDER — INFLUENZA VAC A&B SA ADJ QUAD 0.5 ML IM PRSY
0.5000 mL | PREFILLED_SYRINGE | INTRAMUSCULAR | Status: AC
  Administered 2019-03-31: 0.5 mL via INTRAMUSCULAR
  Filled 2019-03-30: qty 0.5

## 2019-03-30 MED ORDER — PANTOPRAZOLE SODIUM 40 MG PO TBEC
40.0000 mg | DELAYED_RELEASE_TABLET | Freq: Every day | ORAL | Status: DC
  Administered 2019-03-30 – 2019-03-31 (×2): 40 mg via ORAL
  Filled 2019-03-30 (×2): qty 1

## 2019-03-30 MED ORDER — HEPARIN BOLUS VIA INFUSION
2500.0000 [IU] | Freq: Once | INTRAVENOUS | Status: AC
  Administered 2019-03-30: 13:00:00 2500 [IU] via INTRAVENOUS
  Filled 2019-03-30: qty 2500

## 2019-03-30 MED ORDER — SODIUM CHLORIDE 0.9 % IV SOLN
INTRAVENOUS | Status: DC
  Administered 2019-03-30 (×2): via INTRAVENOUS

## 2019-03-30 MED ORDER — PSYLLIUM 95 % PO PACK
1.0000 | PACK | Freq: Every day | ORAL | Status: DC
  Administered 2019-03-30: 1 via ORAL
  Filled 2019-03-30 (×2): qty 1

## 2019-03-30 MED ORDER — SODIUM CHLORIDE 0.9% FLUSH: 3.0000 mL | Freq: Two times a day (BID) | INTRAVENOUS | Status: DC

## 2019-03-30 MED ORDER — HEPARIN (PORCINE) 25000 UT/250ML-% IV SOLN
1400.0000 [IU]/h | INTRAVENOUS | Status: DC
  Administered 2019-03-30: 17:00:00 1400 [IU]/h via INTRAVENOUS

## 2019-03-30 MED ORDER — LOPERAMIDE HCL 2 MG PO CAPS
2.0000 mg | ORAL_CAPSULE | ORAL | Status: DC | PRN
  Administered 2019-03-31: 12:00:00 2 mg via ORAL
  Filled 2019-03-30: qty 1

## 2019-03-30 MED ORDER — HYDROCODONE-ACETAMINOPHEN 5-325 MG PO TABS
1.0000 | ORAL_TABLET | Freq: Three times a day (TID) | ORAL | Status: DC | PRN
  Administered 2019-03-30: 1 via ORAL
  Filled 2019-03-30: qty 1

## 2019-03-30 MED ORDER — VITAMIN C 500 MG PO TABS
250.0000 mg | ORAL_TABLET | Freq: Every day | ORAL | Status: DC
  Filled 2019-03-30: qty 1

## 2019-03-30 MED ORDER — HEPARIN (PORCINE) 25000 UT/250ML-% IV SOLN
1400.0000 [IU]/h | INTRAVENOUS | Status: DC
  Administered 2019-03-30: 13:00:00 1400 [IU]/h via INTRAVENOUS
  Filled 2019-03-30: qty 250

## 2019-03-30 MED ORDER — NADOLOL 40 MG PO TABS
40.0000 mg | ORAL_TABLET | Freq: Every day | ORAL | Status: DC
  Administered 2019-03-30 – 2019-03-31 (×2): 40 mg via ORAL
  Filled 2019-03-30 (×2): qty 1

## 2019-03-30 MED ORDER — MIRTAZAPINE 15 MG PO TABS
45.0000 mg | ORAL_TABLET | Freq: Every day | ORAL | Status: DC
  Administered 2019-03-30: 21:00:00 45 mg via ORAL
  Filled 2019-03-30: qty 3

## 2019-03-30 MED ORDER — SODIUM CHLORIDE 0.9 % IV SOLN
INTRAVENOUS | Status: DC
  Administered 2019-03-30: 13:00:00 via INTRAVENOUS

## 2019-03-30 MED ORDER — HEPARIN (PORCINE) 25000 UT/250ML-% IV SOLN
1550.0000 [IU]/h | INTRAVENOUS | Status: DC
  Filled 2019-03-30: qty 250

## 2019-03-30 MED ORDER — HYDROCODONE-ACETAMINOPHEN 5-325 MG PO TABS
1.0000 | ORAL_TABLET | Freq: Three times a day (TID) | ORAL | Status: DC | PRN
  Administered 2019-03-30 – 2019-03-31 (×3): 1 via ORAL
  Filled 2019-03-30 (×3): qty 1

## 2019-03-30 MED ORDER — SODIUM FLUORIDE 1.1 % DT PSTE: 1.0000 "application " | PASTE | Freq: Two times a day (BID) | DENTAL | Status: DC

## 2019-03-30 MED ORDER — FERROUS SULFATE 325 (65 FE) MG PO TABS
325.0000 mg | ORAL_TABLET | ORAL | Status: DC
  Filled 2019-03-30: qty 1

## 2019-03-30 MED ORDER — BUPROPION HCL ER (SR) 150 MG PO TB12
150.0000 mg | ORAL_TABLET | Freq: Every day | ORAL | Status: DC
  Administered 2019-03-30 – 2019-03-31 (×2): 150 mg via ORAL
  Filled 2019-03-30 (×2): qty 1

## 2019-03-30 NOTE — ED Notes (Signed)
ED TO INPATIENT HANDOFF REPORT  Name/Age/Gender Billy [name] 73 y.o. male  Code Status Code Status History    Date Active Date Inactive Code Status Order ID Comments User Context   04/13/2017 1540 04/16/2017 2227 Full Code [id number]  Charlesetta Garibaldi, MD Inpatient   01/06/2012 1548 01/10/2012 1511 Full Code [id number]  Grant Fontana, RN Inpatient   Advance Care Planning Activity    Advance Directive Documentation     Most Recent Value  Type of Advance Directive  Living will, Healthcare Power of Attorney  Pre-existing out of facility DNR order (yellow form or pink MOST form)  -  "MOST" Form in Place?  -      Home/SNF/Other Home  Chief Complaint discomfort in rt leg, blood clotts  Level of Care/Admitting Diagnosis ED Disposition    ED Disposition Condition [place]: [place] [100102]  Level of Care: Telemetry [5]  Admit to tele based on following criteria: Eval of Syncope  Covid Evaluation: Asymptomatic Screening Protocol (No Symptoms)  Diagnosis: D[facility] axillary vein, [id number]  Admitting Physician: Nita Sells [phone]  Attending Physician: Nita Sells [phone]  Estimated length of stay: 3 - 4 days  Certification:: I certify this patient will need inpatient services for at least 2 midnights  PT Class (Do Not Modify): Inpatient [101]  PT Acc Code (Do Not Modify): Private [1]       Medical History Past Medical History:  Diagnosis Date  . Acute posthemorrhagic anemia   . 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 Progressed from fatty liver to cirrhosis based upon review imaging. Manifestations are portal hypertension with esophageal varices, portal gastropathy, rectal varices and he may have portal colopathy. Ascites also present. Naive to HAV and HBV Claims he has had pneumococcal vaccine   . Alcoholism ([place])   . Angiodysplasia of colon 09/11/2011   Multiple  in the right colon, ablated with APC. Question if this is portal colopathy.   . Anxiety   . Back pain   . Basal cell carcinoma   . Bilateral varicoceles   . Campylobacter diarrhea 01/07/2012  . Cataract    removed  . DDD (degenerative disc disease), lumbar   . Depression    mild at most  . Diverticulosis of colon   . Erectile dysfunction   . Gastric ulcer 08/2011   small, antral ulcer  . GERD (gastroesophageal reflux disease)    takes Omeprazole prn  . GI hemorrhage 2006, 2013   esophagitis and 1+ varices, melena in 2013   . Grade I diastolic dysfunction   . Hearing loss   . History of ascites   . History of blood transfusion    as a child and no abnormal reaction noted  . History of bronchitis    >32yrs ago  . History of dizziness   . Hydrocele   . Hypertension    takes Nadolol daily  . Narcotic dependence ([place])   . Personal history of colonic polyps   . Portal hypertension ([place]) 08/2011  . 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 Right hydrocele   . Scrotal edema   . Sleep disturbance    takes Remeron nightly  . Splenomegaly   . Thrombocytopenia, unspecified ([place])   . 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 Yellow Jacket Venom Anaphylaxis, Shortness Of Breath and Other (See Comments)  Throat closes up/eye swell shut  . Nsaids Other (See Comments)    Stomach pain, Bleeding risk    IV Location/Drains/Wounds Patient Lines/Drains/Airways Status   Active Line/Drains/Airways    Name:   Placement date:   Placement time:   Site:   Days:   Peripheral IV 05/02/17 Right Hand   05/02/17    1042    Hand   697   Peripheral IV 03/30/19 Left Antecubital   03/30/19    1050    Antecubital   less than 1   Incision 12/31/12 Abdomen Other (Comment)   12/31/12    1040     2280   Incision - 4 Ports Abdomen Umbilicus Medial;Upper Right;Lateral;Upper Right;Lateral;Lower   12/31/12    1021     2280           Labs/Imaging Results for orders placed or performed during the hospital encounter of 03/30/19 (from the past 48 hour(s))  Comprehensive metabolic panel     Status: Abnormal   Collection Time: 03/30/19 10:50 AM  Result Value Ref Range   Sodium 135 135 - 145 mmol/L   Potassium 4.5 3.5 - 5.1 mmol/L   Chloride 98 98 - 111 mmol/L   CO2 27 22 - 32 mmol/L   Glucose, Bld 109 (H) 70 - 99 mg/dL   BUN 34 (H) 8 - 23 mg/dL   Creatinine, Ser 1.77 (H) 0.61 - 1.24 mg/dL   Calcium 9.0 8.9 - 10.3 mg/dL   Total Protein 7.6 6.5 - 8.1 g/dL   Albumin 3.4 (L) 3.5 - 5.0 g/dL   AST 36 15 - 41 U/L   ALT 16 0 - 44 U/L   Alkaline Phosphatase 71 38 - 126 U/L   Total Bilirubin 1.7 (H) 0.3 - 1.2 mg/dL   GFR calc non Af Amer 38 (L) >60 mL/min   GFR calc Af Amer 44 (L) >60 mL/min   Anion gap 10 5 - 15    Comment: Performed at [facility], [place] [address] [place], [place] [address]  CBC with Differential     Status: Abnormal   Collection Time: 03/30/19 10:50 AM  Result Value Ref Range   WBC 7.0 4.0 - 10.5 K/uL   RBC 3.56 (L) 4.22 - 5.81 MIL/uL   Hemoglobin 12.1 (L) 13.0 - 17.0 g/dL   HCT 37.0 (L) 39.0 - 52.0 %   MCV 103.9 (H) 80.0 - 100.0 fL   MCH 34.0 26.0 - 34.0 pg   MCHC 32.7 30.0 - 36.0 g/dL   RDW 14.2 11.5 - 15.5 %   Platelets 111 (L) 150 - 400 K/uL    Comment: PLATELET COUNT CONFIRMED BY SMEAR Immature Platelet Fraction may be clinically indicated, consider ordering this additional test [id number]    nRBC 0.0 0.0 - 0.2 %   Neutrophils Relative % 75 %   Neutro Abs 5.3 1.7 - 7.7 K/uL   Lymphocytes Relative 8 %   Lymphs Abs 0.6 (L) 0.7 - 4.0 K/uL   Monocytes Relative 12 %   Monocytes Absolute 0.8 0.1 - 1.0 K/uL   Eosinophils Relative 3 %   Eosinophils Absolute 0.2 0.0 - 0.5 K/uL   Basophils Relative 1 %   Basophils Absolute 0.0 0.0 - 0.1 K/uL   Immature Granulocytes 1 %   Abs Immature Granulocytes 0.04 0.00 - 0.07 K/uL    Comment: Performed at [facility], [place] [address], [place],  [address]  Protime-INR     Status:  Abnormal   Collection Time: 03/30/19 10:50 AM  Result Value Ref Range   Prothrombin Time 15.6 (H) 11.4 - 15.2 seconds   INR 1.3 (H) 0.8 - 1.2    Comment: (NOTE) INR goal varies based on device and disease states. Performed at [facility], [place] [address], [place], [place] [address]   SARS Coronavirus 2 [facility] order, Performed in [facility] hospital lab) Nasopharyngeal Nasopharyngeal Swab     Status: None   Collection Time: 03/30/19 12:06 PM   Specimen: Nasopharyngeal Swab  Result Value Ref Range   SARS Coronavirus 2 NEGATIVE NEGATIVE    Comment: (NOTE) If result is NEGATIVE SARS-CoV-2 target nucleic acids are NOT DETECTED. The SARS-CoV-2 RNA is generally detectable in upper and lower  respiratory specimens during the acute phase of infection. The lowest  concentration of SARS-CoV-2 viral copies this assay can detect is 250  copies / mL. A negative result does not preclude SARS-CoV-2 infection  and should not be used as the sole basis for treatment or other  patient management decisions.  A negative result may occur with  improper specimen collection / handling, submission of specimen other  than nasopharyngeal swab, presence of viral mutation(s) within the  areas targeted by this assay, and inadequate number of viral copies  (<250 copies / mL). A negative result must be combined with clinical  observations, patient history, and epidemiological information. If result is POSITIVE SARS-CoV-2 target nucleic acids are DETECTED. The SARS-CoV-2 RNA is generally detectable in upper and lower  respiratory specimens dur ing the acute phase of infection.  Positive  results are indicative of active infection with SARS-CoV-2.  Clinical  correlation with patient history and other diagnostic information is  necessary to determine patient infection status.  Positive results do  not rule out  bacterial infection or co-infection with other viruses. If result is PRESUMPTIVE POSTIVE SARS-CoV-2 nucleic acids MAY BE PRESENT.   A presumptive positive result was obtained on the submitted specimen  and confirmed on repeat testing.  While 2019 novel coronavirus  (SARS-CoV-2) nucleic acids may be present in the submitted sample  additional confirmatory testing may be necessary for epidemiological  and / or clinical management purposes  to differentiate between  SARS-CoV-2 and other Sarbecovirus currently known to infect humans.  If clinically indicated additional testing with an alternate test  methodology [phone]) is advised. The SARS-CoV-2 RNA is generally  detectable in upper and lower respiratory sp ecimens during the acute  phase of infection. The expected result is Negative. Fact Sheet for Patients:  StrictlyIdeas.no Fact Sheet for Healthcare Providers: BankingDealers.co.za This test is not yet approved or cleared by the [place] FDA and has been authorized for detection and/or diagnosis of SARS-CoV-2 by FDA under an Emergency Use Authorization (EUA).  This EUA will remain in effect (meaning this test can be used) for the duration of the COVID-19 declaration under Section 564(b)(1) of the Act, 21 U.S.C. section 360bbb-3(b)(1), unless the authorization is terminated or revoked sooner. Performed at [facility], [place] [address], [place], [place] [address]    Vas [place] Lower Extremity Venous (dvt)  Result Date: 03/30/2019  Lower Venous Study Indications: Pain, and Swelling.  Comparison Study: No prior study Performing Technologist: Maudry Mayhew MHA, RDMS, RVT, RDCS  Examination Guidelines: A complete evaluation includes B-mode imaging, spectral Doppler, color Doppler, and power Doppler as needed of all accessible portions of each vessel. Bilateral testing is considered an integral part of a complete  examination.  Limited examinations for reoccurring indications may be performed as noted.  +---------+---------------+---------+-----------+----------+--------------+ RIGHT    CompressibilityPhasicitySpontaneityPropertiesThrombus Aging +---------+---------------+---------+-----------+----------+--------------+ CFV      None                    No                   Acute          +---------+---------------+---------+-----------+----------+--------------+ SFJ      None                                         Acute          +---------+---------------+---------+-----------+----------+--------------+ FV Prox  None                                         Acute          +---------+---------------+---------+-----------+----------+--------------+ FV Mid   None                                         Acute          +---------+---------------+---------+-----------+----------+--------------+ FV DistalNone                                         Acute          +---------+---------------+---------+-----------+----------+--------------+ PFV      None                                         Acute          +---------+---------------+---------+-----------+----------+--------------+ POP      Partial        Yes      Yes                  Acute          +---------+---------------+---------+-----------+----------+--------------+ PTV      None                                         Acute          +---------+---------------+---------+-----------+----------+--------------+ PERO     None                                         Acute          +---------+---------------+---------+-----------+----------+--------------+ GSV      None                                                        +---------+---------------+---------+-----------+----------+--------------+ EIV  No                   Acute           +---------+---------------+---------+-----------+----------+--------------+ CIV                              Yes                                 +---------+---------------+---------+-----------+----------+--------------+   +----+---------------+---------+-----------+----------+--------------+ LEFTCompressibilityPhasicitySpontaneityPropertiesThrombus Aging +----+---------------+---------+-----------+----------+--------------+ CFV Full           Yes      Yes                                 +----+---------------+---------+-----------+----------+--------------+ EIV                         Yes                                 +----+---------------+---------+-----------+----------+--------------+ CIV                         Yes                                 +----+---------------+---------+-----------+----------+--------------+  Unable to visualize IVC.  Summary: Right: Findings consistent with acute deep vein thrombosis involving the right external iliac vein, right common femoral vein, right femoral vein, right proximal profunda vein, right popliteal vein, right posterior tibial veins, and right peroneal veins.  No cystic structure found in the popliteal fossa. Left: No evidence of common femoral vein obstruction.  *See table(s) above for measurements and observations. Electronically signed by Deitra Mayo MD on 03/30/2019 at 2:54:12 PM.    Final     Pending Labs Unresulted Labs (From admission, onward)    Start     Ordered   03/31/19 0500  CBC  Daily,   R     03/30/19 1154   03/30/19 2100  Heparin level (unfractionated)  Once-Timed,   STAT     03/30/19 1339   03/30/19 1224  AFP tumor marker  Add-on,   AD     03/30/19 1223   03/30/19 1222  Vitamin B12  (Anemia Panel (PNL))  Add-on,   AD     03/30/19 1221   03/30/19 1222  Folate  (Anemia Panel (PNL))  Add-on,   AD     03/30/19 1221   03/30/19 1222  Iron and TIBC  (Anemia Panel (PNL))  Add-on,   AD     03/30/19  1221   03/30/19 1222  Ferritin  (Anemia Panel (PNL))  Add-on,   AD     03/30/19 1221   03/30/19 1222  Reticulocytes  (Anemia Panel (PNL))  Add-on,   AD     03/30/19 1221   Signed and Held  Comprehensive metabolic panel  Tomorrow morning,   R     Signed and Held   Signed and Held  CBC  Tomorrow morning,   R     Signed and Held   Signed and Held  Protime-INR  Tomorrow morning,   R     Signed and Held  Vitals/Pain Today's Vitals   03/30/19 0950 03/30/19 0952 03/30/19 1100 03/30/19 1149  BP:  127/67 129/63   Pulse:  (!) 57 (!) 57   Resp:  18 16   Temp:  98.1 F (36.7 C)    TempSrc:  Oral    SpO2:  100% 98%   Weight:    73.9 kg  Height:    6\' 3"  (1.905 m)  PainSc: 0-No pain       Isolation Precautions No active isolations  Medications Medications  0.9 %  sodium chloride infusion ( Intravenous New Bag/Given 03/30/19 1247)  heparin bolus via infusion 2,500 Units (2,500 Units Intravenous Bolus from Bag 03/30/19 1251)    Followed by  heparin ADULT infusion 100 units/mL (25000 units/212mL sodium chloride 0.45%) (1,400 Units/hr Intravenous New Bag/Given 03/30/19 1251)  HYDROcodone-acetaminophen (NORCO/VICODIN) 5-325 MG per tablet 1 tablet (1 tablet Oral Given 03/30/19 1335)  influenza vaccine adjuvanted (FLUAD) injection 0.5 mL (has no administration in time range)    Mobility walks

## 2019-03-30 NOTE — ED Notes (Signed)
Pt given sandwhich, cheese stick, and graham crackers

## 2019-03-30 NOTE — H&P (Addendum)
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 Arelia Longest his gastroenterologist and was found to have an acute DVT  Review of Systems:   Pertinent +'s: Plus minus abdominal swelling Pertinent -"s: No shortness of breath no chest pain no chills no vomiting no dark stool no tarry stool no unilateral weakness no rash no easy bruising no blurred vision no double vision no seizures  ED Course: Coronavirus test done but pending Saline started at 125 but cut back to 75 cc/H, heparin infusion started-I changed it to no bolus  Past Medical History:  Diagnosis Date  . Acute posthemorrhagic anemia   . Alcoholic cirrhosis of liver ([place]) 09/24/2011   Years of alcohol use. Progressed from fatty liver to cirrhosis based upon review imaging. Manifestations are portal hypertension with esophageal varices, portal gastropathy, rectal varices and he may have portal colopathy. Ascites also present. Naive to HAV and HBV Claims he has had pneumococcal vaccine   . Alcoholism ([place])   . Angiodysplasia of colon 09/11/2011   Multiple in the right colon, ablated with APC. Question if this is portal  colopathy.   . Anxiety   . Back pain   . Basal cell carcinoma   . Bilateral varicoceles   . Campylobacter diarrhea 01/07/2012  . Cataract    removed  . DDD (degenerative disc disease), lumbar   . Depression    mild at most  . Diverticulosis of colon   . Erectile dysfunction   . Gastric ulcer 08/2011   small, antral ulcer  . GERD (gastroesophageal reflux disease)    takes Omeprazole prn  . GI hemorrhage 2006, 2013   esophagitis and 1+ varices, melena in 2013   . Grade I diastolic dysfunction   . Hearing loss   . History of ascites   . History of blood transfusion    as a child and no abnormal reaction noted  . History of bronchitis    >8yrs ago  . History of dizziness   . Hydrocele   . Hypertension    takes Nadolol daily  . Narcotic dependence ([place])   . Personal history of colonic polyps   . Portal hypertension ([place]) 08/2011  . 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 Right hydrocele   . Scrotal edema   . Sleep disturbance    takes Remeron nightly  . Splenomegaly   . Thrombocytopenia, unspecified ([place])   . 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 BIOPSY  08/27/2018   Procedure: BIOPSY;  Surgeon: Milus Banister, MD;  Location: [facility];  Service: Endoscopy;;  . CATARACT EXTRACTION  2013   bilateral with  IOL  . CHOLECYSTECTOMY  12/31/2012   Procedure: LAPAROSCOPIC CHOLECYSTECTOMY;  Surgeon: Zenovia Jarred, MD;  Location: [facility];  Service: General;;  . COLONOSCOPY  2001, 200411/02/2006  . COLONOSCOPY  09/11/2011   Procedure: COLONOSCOPY;  Surgeon: Gatha Mayer, MD;  Location: [facility];  Service: Endoscopy;  Laterality: N/A;  . COLONOSCOPY    . ESOPHAGEAL BANDING N/A 05/02/2017   Procedure: ESOPHAGEAL BANDING;  Surgeon: Gatha Mayer, MD;  Location: [facility];  Service: Endoscopy;  Laterality: N/A;  . ESOPHAGEAL BANDING  06/09/2018   Procedure: ESOPHAGEAL BANDING;  Surgeon: Gatha Mayer,  MD;  Location: [facility];  Service: Endoscopy;;  . ESOPHAGEAL BANDING  08/27/2018   Procedure: ESOPHAGEAL BANDING;  Surgeon: Milus Banister, MD;  Location: [facility];  Service: Endoscopy;;  . ESOPHAGOGASTRODUODENOSCOPY  2006  . ESOPHAGOGASTRODUODENOSCOPY  09/11/2011   Procedure: ESOPHAGOGASTRODUODENOSCOPY (EGD);  Surgeon: Gatha Mayer, MD;  [facility];  Service: Endoscopy;  Laterality: N/A;  . ESOPHAGOGASTRODUODENOSCOPY N/A 08/27/2018   Procedure: ESOPHAGOGASTRODUODENOSCOPY (EGD);  Surgeon: Milus Banister, MD;  Location: [facility];  Service: Endoscopy;  Laterality: N/A;  . ESOPHAGOGASTRODUODENOSCOPY (EGD) WITH PROPOFOL N/A 04/13/2017   Procedure: ESOPHAGOGASTRODUODENOSCOPY (EGD) WITH PROPOFOL;  Surgeon: Doran Stabler, MD;  Location: [facility];  Service: Gastroenterology;  Laterality: N/A;  . ESOPHAGOGASTRODUODENOSCOPY (EGD) WITH PROPOFOL N/A 05/02/2017   Procedure: ESOPHAGOGASTRODUODENOSCOPY (EGD) WITH PROPOFOL;  Surgeon: Gatha Mayer, MD;  [facility];  Service: Endoscopy;  Laterality: N/A;  . ESOPHAGOGASTRODUODENOSCOPY (EGD) WITH PROPOFOL N/A 09/30/2017   Procedure: ESOPHAGOGASTRODUODENOSCOPY (EGD) WITH PROPOFOL;  Surgeon: Gatha Mayer, MD;  [facility];  Service: Endoscopy;  Laterality: N/A;  . ESOPHAGOGASTRODUODENOSCOPY (EGD) WITH PROPOFOL N/A 06/09/2018   Procedure: ESOPHAGOGASTRODUODENOSCOPY (EGD) WITH PROPOFOL;  Surgeon: Gatha Mayer, MD;  [facility];  Service: Endoscopy;  Laterality: N/A;  . ESOPHAGOGASTRODUODENOSCOPY (EGD) WITH PROPOFOL N/A 01/05/2019   Procedure: ESOPHAGOGASTRODUODENOSCOPY (EGD) WITH PROPOFOL;  Surgeon: Gatha Mayer, MD;  Location[facility];  Service: Endoscopy;  Laterality: N/A;  . EUS N/A 08/27/2018   Procedure: UPPER ENDOSCOPIC ULTRASOUND (EUS) RADIAL;  Surgeon: Milus Banister, MD;  Location: [facility];  Service: Endoscopy;  Laterality: N/A;  . EYE SURGERY    . FLEXIBLE SIGMOIDOSCOPY   01/07/2012   Procedure: FLEXIBLE SIGMOIDOSCOPY;  Surgeon: Lafayette Dragon, MD;  Location: [facility];  Service: Endoscopy;  Laterality: N/A;  . KNEE ARTHROSCOPY  2008   Right  . KNEE ARTHROSCOPY  2008   Left  . POLYPECTOMY    . [place] THORA/PARACENTESIS  10/28/2011  . Varicocele repair  1980's    reports that he has been smoking cigarettes and e-cigarettes. He has been smoking about 0.50 packs per day. He has never used smokeless tobacco. He reports previous alcohol use. He reports current drug use. Drug: Marijuana.  Mobility: Independent  Allergies  Allergen Reactions  . Yellow Jacket Venom Anaphylaxis, Shortness Of Breath and Other (See Comments)    Throat closes up/eye swell shut  . Nsaids Other (See Comments)    Stomach pain, Bleeding risk   Family History  Problem Relation Age of Onset  . Stroke Mother   . Diabetes Mother   . Lung cancer Brother   . Prostate cancer Brother   . Liver cancer Cousin   . Liver cancer Paternal Uncle   . Colon cancer Neg Hx   . Stomach cancer Neg Hx   . Esophageal cancer Neg Hx   . Colon polyps Neg Hx   .  Rectal cancer Neg Hx   . Pancreatic cancer Neg Hx    Father shot int he head Mother died @ 83 from CVA Brother has lung Ca Other one has Prsotate ca  Prior to Admission medications   Medication Sig Start Date End Date Taking? Authorizing Provider  buPROPion (WELLBUTRIN SR) 150 MG 12 hr tablet TAKE 1 TABLET BY MOUTH EVERY DAY Patient taking differently: Take 150 mg by mouth daily.  06/15/18  Yes Viviana Simpler I, MD  ferrous sulfate 325 (65 FE) MG EC tablet Take 325 mg by mouth every other day.   Yes [provider]  fluocinonide cream (LIDEX) [id number] % Apply 1 application topically 2 (two) times daily as needed (for skin rash/irritation). APPLY ON THE SKIN TWICE A DAY AS NEEDED FOR RASH 12/30/18  Yes [name], MD  furosemide (LASIX) 20 MG tablet Take 1 tablet (20 mg total) by mouth daily. 03/08/19  Yes Gatha Mayer, MD   furosemide (LASIX) 40 MG tablet Take 2 tablets (80 mg total) by mouth daily. 01/05/19  Yes Gatha Mayer, MD  HYDROcodone-acetaminophen (NORCO/VICODIN) 5-325 MG tablet Take 1 tablet by mouth 3 (three) times daily as needed (for pain.). 03/08/19  Yes [name], MD  loperamide (IMODIUM A-D) 2 MG tablet Take 2 mg by mouth as needed for diarrhea or loose stools.   Yes [provider]  mirtazapine (REMERON) 45 MG tablet TAKE 1 TABLET (45 MG TOTAL) BY MOUTH AT BEDTIME. 06/22/18  Yes [name], MD  Multiple Vitamin (MULTIVITAMIN WITH MINERALS) TABS Take 1 tablet by mouth daily.    Yes [provider]  nadolol (CORGARD) 40 MG tablet TAKE 1 TABLET (40 MG TOTAL) BY MOUTH DAILY. Patient taking differently: Take 40 mg by mouth daily.  10/22/18  Yes Ladene Artist, MD  omeprazole (PRILOSEC) 40 MG capsule TAKE 1 CAPSULE BY MOUTH TWICE A DAY Patient taking differently: Take 40 mg by mouth 2 (two) times daily.  09/25/18  Yes [name], MD  PREVIDENT 5000 BOOSTER PLUS 1.1 % PSTE Place 1 application onto teeth 2 (two) times daily.  03/30/18  Yes [provider]  psyllium (METAMUCIL) 58.6 % powder Take 1 packet by mouth at bedtime.    Yes [provider]  spironolactone (ALDACTONE) 50 MG tablet Take 4 tablets (200 mg total) by mouth daily. 11/16/18  Yes Gatha Mayer, MD  vitamin C (ASCORBIC ACID) 250 MG tablet Take 250 mg by mouth daily.    Yes [provider]    Physical Exam:  Vitals:   03/30/19 0952  BP: 127/67  Pulse: (!) 57  Resp: 18  Temp: 98.1 F (36.7 C)  SpO2: 100%   BUN/creatinine up from  Awake alert asthenic very slim build mild icterus no pallor  Neck soft supple no submandibular lymphadenopathy no thyromegaly Mallampati 1 moderate dentition no dentures  No hepatojugular reflux,  Chest is clear mild wheeze bilaterally no rales no egophony  Abdomen soft nontender no rebound cannot appreciate shifting dullness spleen is  present and enlarged  Slight lower extremity edema grade 1  No bruising currently to skin  Right lower extremity and thigh significantly more swollen than left  Neurologically intact no asterixis ROM intact sensory grossly intact power 5/5 major muscle groups  I have personally reviewed following labs and imaging studies  Labs:   22/1 0.3-30 4/1.77, albumin 3.4  Hemoglobin 12.1 platelet 111 concordant with prior values  INR is 1.3  Imaging  studies:   Ultrasound right lower extremity shows extensive venous clot all the way up the leg  Medical tests:   EKG independently reviewed: No EKG at this time  Test discussed with performing physician:  Yes discussed with ED PA Suella Broad in detail  Decision to obtain old records:   Yes reviewed  Review and summation of old records:   Yes extensively summarized  Active Problems:   * No active hospital problems. *   Assessment/Plan Acute occlusive extensive DVT Cautious start heparin-no bolus-vascular surgery Dr. Doren Custard will see to prevent post pontine thrombotic syndrome he may need a procedure We will allow him to eat in the interim as further planning may be needed per Dr. Doren Custard No concerns at this time given clinical presentation for pulmonary embolism given lack of tachycardia etc. etc. 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 Arelia Longest just to make him aware that this may need to be followed-in either event the patient is resigned at this stage II anticoagulation Mild AKI It appears patient had a recent increase in his diuretics which I have held completely today-Spironolactone, Lasix-he can however continue his nadolol at this time to prevent variceal bleed as he is not hypotensive Impaired glucose tolerance His blood sugar ranges anywhere from 1 37-1 09 correlating to 6-6.5 A1c-he will need discussion with Dr. Silvio Pate regarding this in the outpatient setting Macrocytic anemia Continue supplements including iron every other day and consider B12-check iron studies as an add-on to labs and replace as needed Depression Continue Cymbalta 150 daily continue Remeron 45 at bedtime Reflux Continue Protonix at this time    Severity of Illness: The appropriate patient status for this patient is INPATIENT. Inpatient status is judged to be reasonable and necessary in order to provide the required intensity of service to ensure the patient's safety. The patient's presenting symptoms, physical exam findings, and initial radiographic and laboratory data in the context of their chronic comorbidities is felt to place them at high risk for further clinical deterioration. Furthermore, it is not anticipated that the patient will be medically stable for discharge from the hospital within 2 midnights of admission. The following factors support the patient status of inpatient.   " The patient's presenting symptoms include right lower extremity pain. " The worrisome physical exam findings include large swollen thigh. " The initial radiographic and laboratory data are worrisome because of DVT. " The chronic co-morbidities include cirrhosis, INR 1.2.   * I certify that at the point of admission it is my clinical judgment that the patient will  require inpatient hospital care spanning beyond 2 midnights from the point of admission due to high intensity of service, high risk for further deterioration and high frequency of surveillance required.*   DVT prophylaxis: On low-dose heparin Code Status: Full code Family Communication:  No family Consults called: Vascular surgery  Time spent: 62 minutes  Verlon Au, MD Jerl Mina my NP partners at night for Care related issues] [facility] --[facility]ia [employer] [facility] , [email or web address]; password [facility]  03/30/2019, 12:00 PM

## 2019-03-30 NOTE — Progress Notes (Signed)
ANTICOAGULATION CONSULT NOTE - Initial Consult  Pharmacy Consult for Heparin Indication: DVT  Allergies  Allergen Reactions  . Yellow Jacket Venom Anaphylaxis, Shortness Of Breath and Other (See Comments)    Throat closes up/eye swell shut  . Nsaids Other (See Comments)    Stomach pain, Bleeding risk    Patient Measurements:   Heparin Dosing Weight: 73.9kg  Vital Signs: Temp: 98.1 F (36.7 C) (09/15 0952) Temp Source: Oral (09/15 0952) BP: 127/67 (09/15 0952) Pulse Rate: 57 (09/15 0952)  Labs: Recent Labs    03/30/19 1050  HGB 12.1*  HCT 37.0*  PLT 111*  LABPROT 15.6*  INR 1.3*  CREATININE 1.77*    CrCl cannot be calculated (Unknown ideal weight.).   Medical History: Past Medical History:  Diagnosis Date  . Acute posthemorrhagic anemia   . Alcoholic cirrhosis of liver ([place]) 09/24/2011   Years of alcohol use. Progressed from fatty liver to cirrhosis based upon review imaging. Manifestations are portal hypertension with esophageal varices, portal gastropathy, rectal varices and he may have portal colopathy. Ascites also present. Naive to HAV and HBV Claims he has had pneumococcal vaccine   . Alcoholism ([place])   . Angiodysplasia of colon 09/11/2011   Multiple in the right colon, ablated with APC. Question if this is portal colopathy.   . Anxiety   . Back pain   . Basal cell carcinoma   . Bilateral varicoceles   . Campylobacter diarrhea 01/07/2012  . Cataract    removed  . DDD (degenerative disc disease), lumbar   . Depression    mild at most  . Diverticulosis of colon   . Erectile dysfunction   . Gastric ulcer 08/2011   small, antral ulcer  . GERD (gastroesophageal reflux disease)    takes Omeprazole prn  . GI hemorrhage 2006, 2013   esophagitis and 1+ varices, melena in 2013   . Grade I diastolic dysfunction   . Hearing loss   . History of ascites   . History of blood transfusion    as a child and no abnormal reaction noted  . History of bronchitis    >53yrs ago  . History of dizziness   . Hydrocele   . Hypertension    takes Nadolol daily  . Narcotic dependence ([place])   . Personal history of colonic polyps   . Portal hypertension ([place]) 08/2011  . 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 Right hydrocele   . Scrotal edema   . Sleep disturbance    takes Remeron nightly  . Splenomegaly   . Thrombocytopenia, unspecified ([place])   . 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 sodium chloride     No anticoagulation PTA  Assessment: 73 yo M with hx alcoholic cirrhosis had outpatient dopplers + DVT.  CBC- Hg, pltc slightly low but at patient's baseline.  INR slightly elevated at baseline likely due to liver disease.   Goal of Therapy:  Heparin level 0.3-0.7 units/ml Monitor platelets by anticoagulation protocol: Yes   Plan:  Give 2500 units bolus x 1 Start heparin infusion at 1400 units/hr Check anti-Xa level in 8 hours and daily while on heparin Continue to monitor H&H and platelets  F/U long-term anticoagulation plans  Michelle Burnie Therien 03/30/2019,11:40 AM

## 2019-03-30 NOTE — ED Provider Notes (Signed)
[facility] DEPT Provider Note   CSN: [id number] Arrival date & time: 03/30/19  0935     History   Chief Complaint Chief Complaint  Patient presents with  . blood clots    right leg    HPI Billy [name] is a 73 y.o. male.     73yo male presents with complaint of right leg swelling x 4 days, develops pain in his leg with walking, described as aching. Denies CP, SHOB, no pain in the leg otherwise. Patient called his GI regarding his leg swelling and was sent to the hospital for venous doppler, called and told to go to the ER for +DVT study.  No history of prior DVT, PE, not on anticoagulant. Occasional smoker, no recent or extended travel.     Past Medical History:  Diagnosis Date  . Acute posthemorrhagic anemia   . Alcoholic cirrhosis of liver ([place]) 09/24/2011   Years of alcohol use. Progressed from fatty liver to cirrhosis based upon review imaging. Manifestations are portal hypertension with esophageal varices, portal gastropathy, rectal varices and he may have portal colopathy. Ascites also present. Naive to HAV and HBV Claims he has had pneumococcal vaccine   . Alcoholism ([place])   . Angiodysplasia of colon 09/11/2011   Multiple in the right colon, ablated with APC. Question if this is portal colopathy.   . Anxiety   . Back pain   . Basal cell carcinoma   . Bilateral varicoceles   . Campylobacter diarrhea 01/07/2012  . Cataract    removed  . DDD (degenerative disc disease), lumbar   . Depression    mild at most  . Diverticulosis of colon   . Erectile dysfunction   . Gastric ulcer 08/2011   small, antral ulcer  . GERD (gastroesophageal reflux disease)    takes Omeprazole prn  . GI hemorrhage 2006, 2013   esophagitis and 1+ varices, melena in 2013   . Grade I diastolic dysfunction   . Hearing loss   . History of ascites   . History of blood transfusion    as a child and no abnormal reaction noted  . History of bronchitis    >23yrs ago  . History of dizziness   . Hydrocele   . Hypertension    takes Nadolol daily  . Narcotic dependence ([place])   . Personal history of colonic polyps   . Portal hypertension ([place]) 08/2011  . 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 Right hydrocele   . Scrotal edema   . Sleep disturbance    takes Remeron nightly  . Splenomegaly   . Thrombocytopenia, unspecified ([place])   . Varices, esophageal ([place]) 08/2011   Grade 2, mid-esophagus    Patient Active Problem List   Diagnosis Date Noted  . Esophageal varices without bleeding ([place])   . Narcotic dependence ([place]) 05/19/2017  . Secondary esophageal varices with bleeding ([place])   . Leg pain, left 04/24/2016  . Ascites 11/20/2015  . Scrotal edema 11/20/2015  . Hydrocele in adult 11/20/2015  . Alcohol dependence ([place]) 09/26/2014  . Thrombocytopenia ([place]) 09/26/2014  . Routine general medical examination at a health care facility 09/16/2012  . Alcoholic cirrhosis of liver ([place]) 09/24/2011  . Portal hypertension ([place]) 09/11/2011  . Angiodysplasia of colon 09/11/2011  . 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 Iron deficiency anemia due to chronic blood loss 09/05/2011  . Personal history of colonic polyps 09/05/2011  . DEGENERATIVE DISC DISEASE, LUMBAR SPINE 10/12/2007  . SLEEP DISORDER 10/12/2007  .  Essential hypertension, benign 08/06/2006  . Portal hypertensive gastropathy ([place]) 08/06/2006  . DIVERTICULOSIS, COLON 08/06/2006  . Chronic back pain greater than 3 months duration 08/06/2006    Past Surgical History:  Procedure Laterality Date  . BIOPSY  08/27/2018   Procedure: BIOPSY;  Surgeon: Milus Banister, MD;  Location: [facility];  Service: Endoscopy;;  . CATARACT EXTRACTION  2013   bilateral with IOL  . CHOLECYSTECTOMY  12/31/2012   Procedure: LAPAROSCOPIC CHOLECYSTECTOMY;  Surgeon: Zenovia Jarred, MD;  Location: [facility];  Service: General;;  . COLONOSCOPY  2001, 200411/02/2006  . COLONOSCOPY  09/11/2011   Procedure: COLONOSCOPY;  Surgeon:  Gatha Mayer, MD;  Location: [facility];  Service: Endoscopy;  Laterality: N/A;  . COLONOSCOPY    . ESOPHAGEAL BANDING N/A 05/02/2017   Procedure: ESOPHAGEAL BANDING;  Surgeon: Gatha Mayer, MD;  Location: [facility];  Service: Endoscopy;  Laterality: N/A;  . ESOPHAGEAL BANDING  06/09/2018   Procedure: ESOPHAGEAL BANDING;  Surgeon: Gatha Mayer, MD;  Location: [facility];  Service: Endoscopy;;  . ESOPHAGEAL BANDING  08/27/2018   Procedure: ESOPHAGEAL BANDING;  Surgeon: Milus Banister, MD;  Location: [facility];  Service: Endoscopy;;  . ESOPHAGOGASTRODUODENOSCOPY  2006  . ESOPHAGOGASTRODUODENOSCOPY  09/11/2011   Procedure: ESOPHAGOGASTRODUODENOSCOPY (EGD);  Surgeon: Gatha Mayer, MD;  Location: [facility];  Service: Endoscopy;  Laterality: N/A;  . ESOPHAGOGASTRODUODENOSCOPY N/A 08/27/2018   Procedure: ESOPHAGOGASTRODUODENOSCOPY (EGD);  Surgeon: Milus Banister, MD;  Location: [facility];  Service: Endoscopy;  Laterality: N/A;  . ESOPHAGOGASTRODUODENOSCOPY (EGD) WITH PROPOFOL N/A 04/13/2017   Procedure: ESOPHAGOGASTRODUODENOSCOPY (EGD) WITH PROPOFOL;  Surgeon: Doran Stabler, MD;  Location: [facility];  Service: Gastroenterology;  Laterality: N/A;  . ESOPHAGOGASTRODUODENOSCOPY (EGD) WITH PROPOFOL N/A 05/02/2017   Procedure: ESOPHAGOGASTRODUODENOSCOPY (EGD) WITH PROPOFOL;  Surgeon: Gatha Mayer, MD;  [facility]: [facility];  Service: Endoscopy;  Laterality: N/A;  . ESOPHAGOGASTRODUODENOSCOPY (EGD) WITH PROPOFOL N/A 09/30/2017   Procedure: ESOPHAGOGASTRODUODENOSCOPY (EGD) WITH PROPOFOL;  Surgeon: Gatha Mayer, MD;  [facility];  Service: Endoscopy;  Laterality: N/A;  . ESOPHAGOGASTRODUODENOSCOPY (EGD) WITH PROPOFOL N/A 06/09/2018   Procedure: ESOPHAGOGASTRODUODENOSCOPY (EGD) WITH PROPOFOL;  Surgeon: Gatha Mayer, MD;  [facility]: [facility];  Service: Endoscopy;  Laterality: N/A;  . ESOPHAGOGASTRODUODENOSCOPY (EGD) WITH PROPOFOL N/A 01/05/2019    Procedure: ESOPHAGOGASTRODUODENOSCOPY (EGD) WITH PROPOFOL;  Surgeon: Gatha Mayer, MD;  Location: [facility];  Service: Endoscopy;  Laterality: N/A;  . EUS N/A 08/27/2018   Procedure: UPPER ENDOSCOPIC ULTRASOUND (EUS) RADIAL;  Surgeon: Milus Banister, MD;  Location: [facility];  Service: Endoscopy;  Laterality: N/A;  . EYE SURGERY    . FLEXIBLE SIGMOIDOSCOPY  01/07/2012   Procedure: FLEXIBLE SIGMOIDOSCOPY;  Surgeon: Lafayette Dragon, MD;  Location: [facility];  Service: Endoscopy;  Laterality: N/A;  . KNEE ARTHROSCOPY  2008   Right  . KNEE ARTHROSCOPY  2008   Left  . POLYPECTOMY    . [place] THORA/PARACENTESIS  10/28/2011  . Varicocele repair  1980's        Home Medications    Prior to Admission medications   Medication Sig Start Date End Date Taking? Authorizing Provider  buPROPion (WELLBUTRIN SR) 150 MG 12 hr tablet TAKE 1 TABLET BY MOUTH EVERY DAY Patient taking differently: Take 150 mg by mouth daily.  06/15/18  Yes Viviana Simpler I, MD  ferrous sulfate 325 (65 FE) MG EC tablet Take 325 mg by mouth every other day.   Yes [provider]  fluocinonide cream (LIDEX) [id number] % Apply 1 application topically 2 (two) times daily as needed (for skin rash/irritation). APPLY ON THE SKIN TWICE A DAY AS NEEDED FOR RASH 12/30/18  Yes [name], MD  furosemide (LASIX) 20 MG tablet Take 1 tablet (20 mg total) by mouth daily. 03/08/19  Yes Gatha Mayer, MD  furosemide (LASIX) 40 MG tablet Take 2 tablets (80 mg total) by mouth daily. 01/05/19  Yes Gatha Mayer, MD  HYDROcodone-acetaminophen (NORCO/VICODIN) 5-325 MG tablet Take 1 tablet by mouth 3 (three) times daily as needed (for pain.). 03/08/19  Yes [name], MD  loperamide (IMODIUM A-D) 2 MG tablet Take 2 mg by mouth as needed for diarrhea or loose stools.   Yes [provider]  mirtazapine (REMERON) 45 MG tablet TAKE 1 TABLET (45 MG TOTAL) BY MOUTH AT BEDTIME. 06/22/18  Yes [name], MD  Multiple  Vitamin (MULTIVITAMIN WITH MINERALS) TABS Take 1 tablet by mouth daily.    Yes [provider]  nadolol (CORGARD) 40 MG tablet TAKE 1 TABLET (40 MG TOTAL) BY MOUTH DAILY. Patient taking differently: Take 40 mg by mouth daily.  10/22/18  Yes Ladene Artist, MD  omeprazole (PRILOSEC) 40 MG capsule TAKE 1 CAPSULE BY MOUTH TWICE A DAY Patient taking differently: Take 40 mg by mouth 2 (two) times daily.  09/25/18  Yes [name], MD  PREVIDENT 5000 BOOSTER PLUS 1.1 % PSTE Place 1 application onto teeth 2 (two) times daily.  03/30/18  Yes [provider]  psyllium (METAMUCIL) 58.6 % powder Take 1 packet by mouth at bedtime.    Yes [provider]  spironolactone (ALDACTONE) 50 MG tablet Take 4 tablets (200 mg total) by mouth daily. 11/16/18  Yes Gatha Mayer, MD  vitamin C (ASCORBIC ACID) 250 MG tablet Take 250 mg by mouth daily.    Yes [provider]    Family History Family History  Problem Relation Age of Onset  . Stroke Mother   . Diabetes Mother   . Lung cancer Brother   . Prostate cancer Brother   . Liver cancer Cousin   . Liver cancer Paternal Uncle   . Colon cancer Neg Hx   . Stomach cancer Neg Hx   . Esophageal cancer Neg Hx   . Colon polyps Neg Hx   . Rectal cancer Neg Hx   . Pancreatic cancer Neg Hx     Social History Social History   Tobacco Use  . Smoking status: Light Tobacco Smoker    Packs/day: 0.50    Types: Cigarettes, E-cigarettes  . Smokeless tobacco: Never Used  Substance Use Topics  . Alcohol use: Not Currently    Alcohol/week: 0.0 standard drinks  . Drug use: Yes    Types: Marijuana    Comment: occasionally, last time couple of weeks ago     Allergies   Yellow jacket venom and Nsaids   Review of Systems Review of Systems  Constitutional: Negative for fever.  Respiratory: Negative for shortness of breath.   Cardiovascular: Positive for leg swelling. Negative for chest pain.  Musculoskeletal: Positive for  myalgias.  Skin: Negative for rash and wound.  Allergic/Immunologic: Negative for immunocompromised state.  Neurological: Negative for weakness and numbness.  Hematological: Bruises/bleeds easily.  All other systems reviewed and are negative.    Physical Exam Updated Vital Signs BP 127/67   Pulse (!) 57   Temp 98.1 F (36.7 C) (Oral)   Resp 18  Ht 6\' 3"  (1.905 m)   Wt 73.9 kg   SpO2 100%   BMI 20.37 kg/m   Physical Exam Vitals signs and nursing note reviewed.  Constitutional:      General: He is not in acute distress.    Appearance: He is well-developed. He is not diaphoretic.  HENT:     Head: Normocephalic and atraumatic.  Cardiovascular:     Rate and Rhythm: Normal rate and regular rhythm.     Pulses: Normal pulses.     Heart sounds: Normal heart sounds.  Pulmonary:     Effort: Pulmonary effort is normal.     Breath sounds: Examination of the right-upper field reveals wheezing. Wheezing present.     Comments: Very slight end expiratory wheeze right apex Abdominal:     Palpations: Abdomen is soft.     Tenderness: There is no abdominal tenderness.  Musculoskeletal:        General: No tenderness.     Right lower leg: Edema present.     Left lower leg: No edema.     Comments: Swelling to right lower leg compared to left, no tenderness, no erythema, no open wounds. Sensation intact. DP pulses present.  Skin:    General: Skin is warm and dry.     Findings: No erythema or rash.  Neurological:     Mental Status: He is alert and oriented to person, place, and time.  Psychiatric:        Behavior: Behavior normal.      ED Treatments / Results  Labs (all labs ordered are listed, but only abnormal results are displayed) Labs Reviewed  COMPREHENSIVE METABOLIC PANEL - Abnormal; Notable for the following components:      Result Value   Glucose, Bld 109 (*)    BUN 34 (*)    Creatinine, Ser 1.77 (*)    Albumin 3.4 (*)    Total Bilirubin 1.7 (*)    GFR calc non Af  Amer 38 (*)    GFR calc Af Amer 44 (*)    All other components within normal limits  CBC WITH DIFFERENTIAL/PLATELET - Abnormal; Notable for the following components:   RBC 3.56 (*)    Hemoglobin 12.1 (*)    HCT 37.0 (*)    MCV 103.9 (*)    Platelets 111 (*)    Lymphs Abs 0.6 (*)    All other components within normal limits  PROTIME-INR - Abnormal; Notable for the following components:   Prothrombin Time 15.6 (*)    INR 1.3 (*)    All other components within normal limits  SARS CORONAVIRUS 2 (TAT 6-24 HRS)    EKG None  Radiology Vas [place] Lower Extremity Venous (dvt)  Result Date: 03/30/2019  Lower Venous Study Indications: Pain, and Swelling.  Comparison Study: No prior study Performing Technologist: Maudry Mayhew MHA, RDMS, RVT, RDCS  Examination Guidelines: A complete evaluation includes B-mode imaging, spectral Doppler, color Doppler, and power Doppler as needed of all accessible portions of each vessel. Bilateral testing is considered an integral part of a complete examination. Limited examinations for reoccurring indications may be performed as noted.  +---------+---------------+---------+-----------+----------+--------------+ RIGHT    CompressibilityPhasicitySpontaneityPropertiesThrombus Aging +---------+---------------+---------+-----------+----------+--------------+ CFV      None                    No                   Acute          +---------+---------------+---------+-----------+----------+--------------+  SFJ      None                                         Acute          +---------+---------------+---------+-----------+----------+--------------+ FV Prox  None                                         Acute          +---------+---------------+---------+-----------+----------+--------------+ FV Mid   None                                         Acute          +---------+---------------+---------+-----------+----------+--------------+ FV  DistalNone                                         Acute          +---------+---------------+---------+-----------+----------+--------------+ PFV      None                                         Acute          +---------+---------------+---------+-----------+----------+--------------+ POP      Partial        Yes      Yes                  Acute          +---------+---------------+---------+-----------+----------+--------------+ PTV      None                                         Acute          +---------+---------------+---------+-----------+----------+--------------+ PERO     None                                         Acute          +---------+---------------+---------+-----------+----------+--------------+ GSV      None                                                        +---------+---------------+---------+-----------+----------+--------------+ EIV                              No                   Acute          +---------+---------------+---------+-----------+----------+--------------+ CIV                              Yes                                 +---------+---------------+---------+-----------+----------+--------------+   +----+---------------+---------+-----------+----------+--------------+  LEFTCompressibilityPhasicitySpontaneityPropertiesThrombus Aging +----+---------------+---------+-----------+----------+--------------+ CFV Full           Yes      Yes                                 +----+---------------+---------+-----------+----------+--------------+ EIV                         Yes                                 +----+---------------+---------+-----------+----------+--------------+ CIV                         Yes                                 +----+---------------+---------+-----------+----------+--------------+  Unable to visualize IVC.  Summary: Right: Findings consistent with acute deep vein thrombosis  involving the right external iliac vein, right common femoral vein, right femoral vein, right proximal profunda vein, right popliteal vein, right posterior tibial veins, and right peroneal veins.  No cystic structure found in the popliteal fossa. Left: No evidence of common femoral vein obstruction.  *See table(s) above for measurements and observations.    Preliminary     Procedures Procedures (including critical care time)  Medications Ordered in ED Medications  0.9 %  sodium chloride infusion (has no administration in time range)  heparin bolus via infusion 2,500 Units (has no administration in time range)    Followed by  heparin ADULT infusion 100 units/mL (25000 units/223mL sodium chloride 0.45%) (has no administration in time range)     Initial Impression / Assessment and Plan / ED Course  I have reviewed the triage vital signs and the nursing notes.  Pertinent labs & imaging results that were available during my care of the patient were reviewed by me and considered in my medical decision making (see chart for details).  Clinical Course as of Mar 29 1202  Tue Mar 30, 2019  1134 73yo male sent to the ER for + DVT study today. Extensive DVT involving the right lower extremity. Patient denies CP or SHOB, vitals stable. Labs reviewed, case discussed with Dr. Zenia Resides, ER attending, agrees with plan to consult hospitalist for admission, heparin ordered per pharmacy.  Consult to Dr. Verlon Au, hospitalist service, requests consult to vascular surgery.    [LM]  [id number] Case discussed with Dr. Scot Dock with Vascular surgery, Dr. Scot Dock will round on patient at the end of the day today.   [LM]    Clinical Course User Index [LM] Tacy Learn, PA-C      Final Clinical Impressions(s) / ED Diagnoses   Final diagnoses:  Acute deep vein thrombosis (DVT) of proximal vein of right lower extremity [facility])    ED Discharge Orders    None       Roque Lias 03/30/19 1203    Lacretia Leigh, MD 03/30/19 1559

## 2019-03-30 NOTE — Consult Note (Addendum)
REASON FOR CONSULT:    Right lower extremity DVT.  Consult is from the [facility] long emergency department.  ASSESSMENT & PLAN:   EXTENSIVE RIGHT LOWER EXTREMITY DVT: This patient has an extensive right lower extremity DVT.  Given his history of alcoholic cirrhosis with portal hypertension, a history of esophageal varices, thrombocytopenia, and chronic kidney disease I do not think that he is a good candidate for thrombolysis.  I would recommend 24 to 48 hours of IV heparin and then convert to Xarelto for likely 6 months.  I will arrange a follow-up visit with a duplex scan in 3 months.  We have discussed the importance of intermittent leg elevation and the proper positioning for this.  I have also written an order for this.  I think he would benefit from compression stockings however he tells me that he already has compression stockings.  Vascular surgery will be available as needed.  Deitra Mayo, MD, FACS Beeper [phone] Office: [phone]   HPI:   Billy [name] is a pleasant 73 y.o. male, who was admitted today with new onset right lower extremity swelling.  He was found to have an extensive right lower extremity DVT.  Vascular surgery was consulted for further recommendations.  He was out walking his dog 5 nights ago when he noted some right leg pain and subsequently developed right leg swelling.  This has persisted and he presented to the emergency department today.  The patient denies any previous history of DVT.  He denies any recent long travel.  He has no history of cancer.  He is not obese.  He does smoke cigarettes.  He denies any history of claudication or rest pain.  He does have some known chronic kidney disease.  He has a complicated history in that he has known alcoholic cirrhosis complicated by varices in his colon.  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  Past Medical History:  Diagnosis Date  . Acute posthemorrhagic anemia   . Alcoholic  cirrhosis of liver ([place]) 09/24/2011   Years of alcohol use. Progressed from fatty liver to cirrhosis based upon review imaging. Manifestations are portal hypertension with esophageal varices, portal gastropathy, rectal varices and he may have portal colopathy. Ascites also present. Naive to HAV and HBV Claims he has had pneumococcal vaccine   . Alcoholism ([place])   . Angiodysplasia of colon 09/11/2011   Multiple in the right colon, ablated with APC. Question if this is portal colopathy.   . Anxiety   . Back pain   . Basal cell carcinoma   . Bilateral varicoceles   . Campylobacter diarrhea 01/07/2012  . Cataract    removed  . DDD (degenerative disc disease), lumbar   . Depression    mild at most  . Diverticulosis of colon   . Erectile dysfunction   . Gastric ulcer 08/2011   small, antral ulcer  . GERD (gastroesophageal reflux disease)    takes Omeprazole prn  . GI hemorrhage 2006, 2013   esophagitis and 1+ varices, melena in 2013   . Grade I diastolic dysfunction   . Hearing loss   . History of ascites   . History of blood transfusion    as a child and no abnormal reaction noted  . History of bronchitis    >71yrs ago  . History of dizziness   . Hydrocele   . Hypertension    takes Nadolol daily  . Narcotic dependence ([place])   . Personal history of colonic  polyps   . Portal hypertension ([place]) 08/2011  . 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 Right hydrocele   . Scrotal edema   . Sleep disturbance    takes Remeron nightly  . Splenomegaly   . Thrombocytopenia, unspecified ([place])   . Varices, esophageal ([place]) 08/2011   Grade 2, mid-esophagus    Family History  Problem Relation Age of Onset  . Stroke Mother   . Diabetes Mother   . Lung cancer Brother   . Prostate cancer Brother   . Liver cancer Cousin   . Liver cancer Paternal Uncle   . Colon cancer Neg Hx   . Stomach cancer Neg Hx   . Esophageal cancer Neg Hx   . Colon polyps Neg Hx   . Rectal cancer Neg Hx   . Pancreatic cancer  Neg Hx     SOCIAL HISTORY: Social History   Socioeconomic History  . Marital status: Married    Spouse name: Not on file  . Number of children: 1  . Years of education: Not on file  . Highest education level: Not on file  Occupational History  . Occupation: Retired    Comment: mostly from Research officer, political party Needs  . Financial resource strain: Not on file  . Food insecurity    Worry: Not on file    Inability: Not on file  . Transportation needs    Medical: Not on file    Non-medical: Not on file  Tobacco Use  . Smoking status: Light Tobacco Smoker    Packs/day: 0.50    Types: Cigarettes, E-cigarettes  . Smokeless tobacco: Never Used  Substance and Sexual Activity  . Alcohol use: Not Currently    Alcohol/week: 0.0 standard drinks  . Drug use: Yes    Types: Marijuana    Comment: occasionally, last time couple of weeks ago  . Sexual activity: Never  Lifestyle  . Physical activity    Days per week: Not on file    Minutes per session: Not on file  . Stress: Not on file  Relationships  . Social Herbalist on phone: Not on file    Gets together: Not on file    Attends religious service: Not on file    Active member of club or organization: Not on file    Attends meetings of clubs or organizations: Not on file    Relationship status: Not on file  . Intimate partner violence    Fear of current or ex partner: Not on file    Emotionally abused: Not on file    Physically abused: Not on file    Forced sexual activity: Not on file  Other Topics Concern  . Not on file  Social History Narrative   Married- 2nd   1 son (adopted) from previous marriage      Has living will   Wife is  health care POA---alternate is son   Would accept attempts at resuscitation   No tube feeds if cognitively unaware       Allergies  Allergen Reactions  . Yellow Jacket Venom Anaphylaxis, Shortness Of Breath and Other (See Comments)    Throat closes up/eye swell shut  . Nsaids Other  (See Comments)    Stomach pain, Bleeding risk    Current Facility-Administered Medications  Medication Dose Route Frequency Provider Last Rate Last Dose  . 0.9 %  sodium chloride infusion   Intravenous Continuous Nita Sells, MD 75 mL/hr at 03/30/19 1603    .  buPROPion (WELLBUTRIN SR) 12 hr tablet 150 mg  150 mg Oral Daily Nita Sells, MD   150 mg at 03/30/19 1623  . [START ON 03/31/2019] ferrous sulfate tablet 325 mg  325 mg Oral QODAY Samtani, Jai-Gurmukh, MD      . heparin ADULT infusion 100 units/mL (25000 units/254mL sodium chloride 0.45%)  1,400 Units/hr Intravenous Continuous Nita Sells, MD 14 mL/hr at 03/30/19 1726 1,400 Units/hr at 03/30/19 1726  . HYDROcodone-acetaminophen (NORCO/VICODIN) 5-325 MG per tablet 1 tablet  1 tablet Oral TID PRN Nita Sells, MD   1 tablet at 03/30/19 1623  . [START ON 03/31/2019] influenza vaccine adjuvanted (FLUAD) injection 0.5 mL  0.5 mL Intramuscular Tomorrow-1000 Samtani, Jai-Gurmukh, MD      . loperamide (IMODIUM) capsule 2 mg  2 mg Oral PRN Nita Sells, MD      . mirtazapine (REMERON) tablet 45 mg  45 mg Oral QHS Samtani, Jai-Gurmukh, MD      . nadolol (CORGARD) tablet 40 mg  40 mg Oral Daily Nita Sells, MD   40 mg at 03/30/19 1725  . pantoprazole (PROTONIX) EC tablet 40 mg  40 mg Oral Daily Nita Sells, MD   40 mg at 03/30/19 1623  . [START ON 03/31/2019] psyllium (HYDROCIL/METAMUCIL) packet 1 packet  1 packet Oral Daily Samtani, Jai-Gurmukh, MD      . sodium chloride flush (NS) 0.9 % injection 3 mL  3 mL Intravenous Q12H Nita Sells, MD      . Derrill Memo ON 03/31/2019] vitamin C (ASCORBIC ACID) tablet 250 mg  250 mg Oral Daily Nita Sells, MD        REVIEW OF SYSTEMS:  [X]  denotes positive finding, [ ]  denotes negative finding Cardiac  Comments:  Chest pain or chest pressure:    Shortness of breath upon exertion:    Short of breath when lying flat:    Irregular heart  rhythm:        Vascular    Pain in calf, thigh, or hip brought on by ambulation:    Pain in feet at night that wakes you up from your sleep:     Blood clot in your veins:    Leg swelling:  x       Pulmonary    Oxygen at home:    Productive cough:     Wheezing:         Neurologic    Sudden weakness in arms or legs:     Sudden numbness in arms or legs:     Sudden onset of difficulty speaking or slurred speech:    Temporary loss of vision in one eye:     Problems with dizziness:         Gastrointestinal    Blood in stool:     Vomited blood:         Genitourinary    Burning when urinating:     Blood in urine:        Psychiatric    Major depression:         Hematologic    Bleeding problems:    Problems with blood clotting too easily:        Skin    Rashes or ulcers:        Constitutional    Fever or chills:     PHYSICAL EXAM:   Vitals:   03/30/19 1100 03/30/19 1149 03/30/19 1445 03/30/19 1544  BP: 129/63  112/74 101/64  Pulse: (!) 57  67 (!) 59  Resp:  16  16 18   Temp:    98.1 F (36.7 C)  TempSrc:    Oral  SpO2: 98%  99% 100%  Weight:  73.9 kg  73.9 kg  Height:  6\' 3"  (1.905 m)  6\' 2"  (1.88 m)    GENERAL: The patient is a well-nourished male, in no acute distress. The vital signs are documented above. CARDIAC: There is a regular rate and rhythm.  VASCULAR: I do not detect carotid bruits. He has significant right lower extremity swelling as documented below.   He has a palpable dorsalis pedis and posterior tibial pulse bilaterally. PULMONARY: There is good air exchange bilaterally without wheezing or rales. ABDOMEN: Soft and non-tender with normal pitched bowel sounds.  MUSCULOSKELETAL: There are no major deformities or cyanosis. NEUROLOGIC: No focal weakness or paresthesias are detected. SKIN: There are no ulcers or rashes noted. PSYCHIATRIC: The patient has a normal affect.  DATA:    VENOUS DUPLEX: I have independently interpreted his venous duplex  scan.  He has extensive DVT in the right lower extremity from the calf all the way up through the right external iliac vein.  LABS: White blood cell count is 7.  Hemoglobin 12.1.  Hematocrit 37.  Platelets 111,000.  INR is 1.3.  GFR is 38.  Creatinine 1.8.

## 2019-03-30 NOTE — Telephone Encounter (Signed)
Patient has gone to the ED post US doppler for acute DVT

## 2019-03-30 NOTE — Progress Notes (Signed)
Right lower extremity venous duplex completed. Refer to "CV Proc" under chart review to view preliminary results.  Critical results discussed with Dr. Carlean Purl. Patient advised to go to emergency department for treatment.  03/30/2019 9:26 AM Maudry Mayhew, MHA, RVT, RDCS, RDMS

## 2019-03-30 NOTE — Progress Notes (Addendum)
ANTICOAGULATION CONSULT NOTE - Consult  Pharmacy Consult for Heparin Indication: DVT  Allergies  Allergen Reactions  . Yellow Jacket Venom Anaphylaxis, Shortness Of Breath and Other (See Comments)    Throat closes up/eye swell shut  . Nsaids Other (See Comments)    Stomach pain, Bleeding risk    Patient Measurements: Height: 6\' 2"  (188 cm) Weight: 163 lb (73.9 kg) IBW/kg (Calculated) : 82.2 Heparin Dosing Weight: 73.9kg  Vital Signs: Temp: 98.1 F (36.7 C) (09/15 1544) Temp Source: Oral (09/15 1544) BP: 101/64 (09/15 1544) Pulse Rate: 59 (09/15 1544)  Labs: Recent Labs    03/30/19 1050 03/30/19 2038  HGB 12.1*  --   HCT 37.0*  --   PLT 111*  --   LABPROT 15.6*  --   INR 1.3*  --   HEPARINUNFRC  --  0.29*  CREATININE 1.77*  --     Estimated Creatinine Clearance: 40 mL/min (A) (by C-G formula based on SCr of 1.77 mg/dL (H)).   Medical History: Past Medical History:  Diagnosis Date  . Acute posthemorrhagic anemia   . Alcoholic cirrhosis of liver ([place]) 09/24/2011   Years of alcohol use. Progressed from fatty liver to cirrhosis based upon review imaging. Manifestations are portal hypertension with esophageal varices, portal gastropathy, rectal varices and he may have portal colopathy. Ascites also present. Naive to HAV and HBV Claims he has had pneumococcal vaccine   . Alcoholism ([place])   . Angiodysplasia of colon 09/11/2011   Multiple in the right colon, ablated with APC. Question if this is portal colopathy.   . Anxiety   . Back pain   . Basal cell carcinoma   . Bilateral varicoceles   . Campylobacter diarrhea 01/07/2012  . Cataract    removed  . DDD (degenerative disc disease), lumbar   . Depression    mild at most  . Diverticulosis of colon   . Erectile dysfunction   . Gastric ulcer 08/2011   small, antral ulcer  . GERD (gastroesophageal reflux disease)    takes Omeprazole prn  . GI hemorrhage 2006, 2013   esophagitis and 1+ varices, melena in 2013   .  Grade I diastolic dysfunction   . Hearing loss   . History of ascites   . History of blood transfusion    as a child and no abnormal reaction noted  . History of bronchitis    >56yrs ago  . History of dizziness   . Hydrocele   . Hypertension    takes Nadolol daily  . Narcotic dependence ([place])   . Personal history of colonic polyps   . Portal hypertension ([place]) 08/2011  . 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 Right hydrocele   . Scrotal edema   . Sleep disturbance    takes Remeron nightly  . Splenomegaly   . Thrombocytopenia, unspecified ([place])   . Varices, esophageal ([place]) 08/2011   Grade 2, mid-esophagus    Medications:  Infusions:  . sodium chloride 75 mL/hr at 03/30/19 1603  . heparin 1,400 Units/hr (03/30/19 1726)   No anticoagulation PTA  Assessment: 73 yo M with hx alcoholic cirrhosis had outpatient dopplers + DVT.  CBC- Hg, pltc slightly low but at patient's baseline.  INR slightly elevated at baseline likely due to liver disease.  Today, 9/15 2038 HL = 0.29 slightly below goal, no infusion or bleeding issues per RN  Goal of Therapy:  Heparin level 0.3-0.7 units/ml Monitor platelets by anticoagulation protocol: Yes   Plan:  Increase heparin drip  to 1550 units/hr Recheck HL in 8 hrs Daily cbc/HL  F/U long-term anticoagulation plans  Dorrene German 03/30/2019,9:25 PM   Addendum: Today, 9/16 0441 HL = 0.89 above goal, no bleeding or infusion issues per RN. Confirmed drawn from opposite arm as infusion.   Plan: Decrease heparin drip to 1450 units/hr Recheck HL in 8 hours Daily CBC/HL F/u long-term anticoagulation plans  Thanks Dorrene German 03/31/2019 5:24 AM

## 2019-03-30 NOTE — ED Triage Notes (Signed)
Patient sent over from PCP after having ultrasound in right leg. Patient was told he had several blood clots in right leg/groin area and told to come to ED.   Denies pain at this time.  Right leg swollen.   A/Ox4 Ambulatory in triage.   Denies shob or cough.

## 2019-03-30 NOTE — Progress Notes (Signed)
Spoke to tech - she took him to ED at [facility] for further eval and Tx

## 2019-03-31 DIAGNOSIS — K2100 Gastro-esophageal reflux disease with esophagitis, without bleeding: Secondary | ICD-10-CM

## 2019-03-31 DIAGNOSIS — F329 Major depressive disorder, single episode, unspecified: Secondary | ICD-10-CM | POA: Diagnosis not present

## 2019-03-31 DIAGNOSIS — K7030 Alcoholic cirrhosis of liver without ascites: Secondary | ICD-10-CM | POA: Diagnosis not present

## 2019-03-31 DIAGNOSIS — I82421 Acute embolism and thrombosis of right iliac vein: Secondary | ICD-10-CM | POA: Diagnosis not present

## 2019-03-31 DIAGNOSIS — I824Y1 Acute embolism and thrombosis of unspecified deep veins of right proximal lower extremity: Secondary | ICD-10-CM | POA: Diagnosis not present

## 2019-03-31 LAB — COMPREHENSIVE METABOLIC PANEL
ALT: 15 U/L (ref 0–44)
AST: 30 U/L (ref 15–41)
Albumin: 2.9 g/dL — ABNORMAL LOW (ref 3.5–5.0)
Alkaline Phosphatase: 63 U/L (ref 38–126)
Anion gap: 10 (ref 5–15)
BUN: 25 mg/dL — ABNORMAL HIGH (ref 8–23)
CO2: 23 mmol/L (ref 22–32)
Calcium: 8.6 mg/dL — ABNORMAL LOW (ref 8.9–10.3)
Chloride: 101 mmol/L (ref 98–111)
Creatinine, Ser: 1.25 mg/dL — ABNORMAL HIGH (ref 0.61–1.24)
GFR calc Af Amer: >60 mL/min (ref >60)
GFR calc non Af Amer: 58 mL/min — ABNORMAL LOW (ref >60)
Glucose, Bld: 106 mg/dL — ABNORMAL HIGH (ref 70–99)
Potassium: 4.9 mmol/L (ref 3.5–5.1)
Sodium: 134 mmol/L — ABNORMAL LOW (ref 135–145)
Total Bilirubin: 1.3 mg/dL — ABNORMAL HIGH (ref 0.3–1.2)
Total Protein: 6.8 g/dL (ref 6.5–8.1)

## 2019-03-31 LAB — CBC
HCT: 36.7 % — ABNORMAL LOW (ref 39.0–52.0)
Hemoglobin: 11.7 g/dL — ABNORMAL LOW (ref 13.0–17.0)
MCH: 33.1 pg (ref 26.0–34.0)
MCHC: 31.9 g/dL (ref 30.0–36.0)
MCV: 104 fL — ABNORMAL HIGH (ref 80.0–100.0)
Platelets: 105 10*3/uL — ABNORMAL LOW (ref 150–400)
RBC: 3.53 MIL/uL — ABNORMAL LOW (ref 4.22–5.81)
RDW: 14.1 % (ref 11.5–15.5)
WBC: 6.3 10*3/uL (ref 4.0–10.5)
nRBC: 0 % (ref 0.0–0.2)

## 2019-03-31 LAB — PROTIME-INR
INR: 1.4 — ABNORMAL HIGH (ref 0.8–1.2)
Prothrombin Time: 16.8 seconds — ABNORMAL HIGH (ref 11.4–15.2)

## 2019-03-31 LAB — HEPARIN LEVEL (UNFRACTIONATED): Heparin Unfractionated: 0.89 IU/mL — ABNORMAL HIGH (ref 0.30–0.70)

## 2019-03-31 LAB — AFP TUMOR MARKER: AFP, Serum, Tumor Marker: 4.1 ng/mL (ref 0.0–8.3)

## 2019-03-31 MED ORDER — APIXABAN 5 MG PO TABS: 5.0000 mg | ORAL_TABLET | Freq: Two times a day (BID) | ORAL | 3 refills | Status: DC

## 2019-03-31 MED ORDER — APIXABAN 5 MG PO TABS
10.0000 mg | ORAL_TABLET | Freq: Two times a day (BID) | ORAL | Status: DC
  Administered 2019-03-31: 10 mg via ORAL
  Filled 2019-03-31: qty 2

## 2019-03-31 MED ORDER — HEPARIN (PORCINE) 25000 UT/250ML-% IV SOLN
1450.0000 [IU]/h | INTRAVENOUS | Status: DC
  Administered 2019-03-31: 1450 [IU]/h via INTRAVENOUS

## 2019-03-31 MED ORDER — ELIQUIS 5 MG VTE STARTER PACK: ORAL_TABLET | ORAL | 0 refills | Status: DC

## 2019-03-31 NOTE — Discharge Instructions (Signed)
Information on my medicine - ELIQUIS (apixaban)  This medication education was reviewed with me or my healthcare representative as part of my discharge preparation.  The pharmacist that spoke with me during my hospital stay was:  Salvadore Valvano A, RPH  Why was Eliquis prescribed for you? Eliquis was prescribed to treat blood clots that may have been found in the veins of your legs (deep vein thrombosis) or in your lungs (pulmonary embolism) and to reduce the risk of them occurring again.  What do You need to know about Eliquis ? The starting dose is 10 mg (two 5 mg tablets) taken TWICE daily for the FIRST SEVEN (7) DAYS, then on (enter date)  04/07/19  the dose is reduced to ONE 5 mg tablet taken TWICE daily.  Eliquis may be taken with or without food.   Try to take the dose about the same time in the morning and in the evening. If you have difficulty swallowing the tablet whole please discuss with your pharmacist how to take the medication safely.  Take Eliquis exactly as prescribed and DO NOT stop taking Eliquis without talking to the doctor who prescribed the medication.  Stopping may increase your risk of developing a new blood clot.  Refill your prescription before you run out.  After discharge, you should have regular check-up appointments with your healthcare provider that is prescribing your Eliquis.    What do you do if you miss a dose? If a dose of ELIQUIS is not taken at the scheduled time, take it as soon as possible on the same day and twice-daily administration should be resumed. The dose should not be doubled to make up for a missed dose.  Important Safety Information A possible side effect of Eliquis is bleeding. You should call your healthcare provider right away if you experience any of the following: ? Bleeding from an injury or your nose that does not stop. ? Unusual colored urine (red or dark brown) or unusual colored stools (red or black). ? Unusual bruising for  unknown reasons. ? A serious fall or if you hit your head (even if there is no bleeding).  Some medicines may interact with Eliquis and might increase your risk of bleeding or clotting while on Eliquis. To help avoid this, consult your healthcare provider or pharmacist prior to using any new prescription or non-prescription medications, including herbals, vitamins, non-steroidal anti-inflammatory drugs (NSAIDs) and supplements.  This website has more information on Eliquis (apixaban): http://www.eliquis.com/eliquis/home

## 2019-03-31 NOTE — Progress Notes (Signed)
Barber for Heparin Indication: DVT  Allergies  Allergen Reactions  . Yellow Jacket Venom Anaphylaxis, Shortness Of Breath and Other (See Comments)    Throat closes up/eye swell shut  . Nsaids Other (See Comments)    Stomach pain, Bleeding risk    Patient Measurements: Height: 6\' 2"  (188 cm) Weight: 163 lb (73.9 kg) IBW/kg (Calculated) : 82.2 Heparin Dosing Weight: 73.9kg  Vital Signs: Temp: 98 F (36.7 C) (09/16 0437) Temp Source: Oral (09/16 0437) BP: 123/62 (09/16 0437) Pulse Rate: 63 (09/16 0437)  Labs: Recent Labs    03/30/19 1050 03/30/19 2038 03/31/19 0441  HGB 12.1*  --  11.7*  HCT 37.0*  --  36.7*  PLT 111*  --  105*  LABPROT 15.6*  --  16.8*  INR 1.3*  --  1.4*  HEPARINUNFRC  --  0.29* 0.89*  CREATININE 1.77*  --  1.25*    Estimated Creatinine Clearance: 56.7 mL/min (A) (by C-G formula based on SCr of 1.25 mg/dL (H)).   Medications:  Infusions:  . sodium chloride 75 mL/hr at 03/31/19 0524   No anticoagulation PTA  Assessment: 73 yo M with hx alcoholic cirrhosis had outpatient dopplers + DVT.  CBC- Hg, pltc slightly low but at patient's baseline. INR slightly elevated at baseline likely due to liver disease.   Today, 03/31/2019:  CBC: Hgb & Plt remain low but stable  SCr improved  Transition to Eliquis today  Plan:  Start Eliquis 10 mg PO bid x 7 days followed by 5 mg PO bid thereafter  Stop heparin with first dose of Eliquis  Pharmacy to provide anticoagulant education prior to discharge   Reuel Boom, PharmD, BCPS [phone] 03/31/2019, 9:46 AM

## 2019-03-31 NOTE — Discharge Summary (Signed)
Physician Discharge Summary  Billy [name] [id number] DOB: April 20, 1946 DOA: 03/30/2019  PCP: [name], MD  Admit date: 03/30/2019 Discharge date: 03/31/2019  Time spent: 35 minutes  Recommendations for Outpatient Follow-up:  1. Repeat CBC to follow hemoglobin trend 2. Repeat basic metabolic panel to follow electrolytes and renal function 3. Outpatient follow-up with vascular surgery for reevaluation in 3 months of right lower extremity blood clot.   Discharge Diagnoses:  Active Problems:   Depression   DVT of axillary vein, acute right (HCC)   Acute deep vein thrombosis (DVT) of proximal vein of right lower extremity (HCC)   Gastroesophageal reflux disease with esophagitis   Discharge Condition: Stable and improved.  Discharged home on oral anticoagulation with instruction to follow-up with PCP and vascular surgery as an outpatient.  Diet recommendation: Heart healthy diet.  Filed Weights   03/30/19 1149 03/30/19 1544  Weight: 73.9 kg 73.9 kg    History of present illness:  As per H&P written by Dr. 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 Arelia Longest his gastroenterologist and was found to have an acute DVT  Hospital Course:  1-acute extensive right lower extremity  DVT -Patient has been seen by vascular surgery who has recommended oral anticoagulation after 24 hours of IV heparin therapy. -Not a candidate for thrombolyzes. -Recommended intermittent episodes of leg elevation to assist with swelling and down the road to use of a stocking compression socks. -After 24 hours of IV heparin treatment patient with significant improvement in swelling and not really complaining of much pain.  He was transitioned to Eliquis as oral anticoagulation and discharged home with instructions to follow-up with PCP and follow-up with vascular surgeons in 3 months. -Plan is for 6 months treatment.  2-alcoholic cirrhosis -Resume home medications and outpatient follow-up with gastroenterology service. -No signs of active bleeding currently.  3-pancytopenia -In the setting of cirrhosis -No signs of overt bleeding currently -Close follow-up with patient's blood count, specifically his hemoglobin level as he is now receiving treatment with anticoagulation.  4-impaired glucose tolerance -A1c 6-6.5 -Continue modified carbohydrate diet -Outpatient follow-up with patient's PCP for further decisions about treatment of his hyperglycemia.  5-depression -Continue Cymbalta, Wellbutrin and Remeron. -No suicidal ideation or hallucinations.  6-gastroesophageal disease -Continue PPI.  7-hypertension/portal hypertension -Continue nadolol. -Blood pressure stable and well-controlled.  Procedures:  See below for x-ray reports  Consultations:  Vascular surgery  Discharge Exam: Vitals:   03/30/19 2142 03/31/19 0437  BP: (!) 110/47 123/62  Pulse: 63 63  Resp: 18 18  Temp: 98.1 F (36.7 C) 98 F (36.7 C)  SpO2: 100% 99%    General: Afebrile, no chest pain, no shortness of breath, no nausea, no vomiting. Cardiovascular: S1 and S2, no rubs, no gallops, no JVD on exam. Respiratory: Clear to auscultation bilaterally. Abdomen: Soft, nontender,  nondistended, positive bowel  sounds. Extremities: no cyanosis, trace edema bilaterally (R> L)  Discharge Instructions   Discharge Instructions    Diet - low sodium heart healthy   Complete by: As directed    Discharge instructions   Complete by: As directed    Take medications as prescribed Maintain adequate hydration Arrange follow up with PCP in 10 days Follow up with vascular surgery service in 3 months (contact office for appointment details) Follow heart healthy/low sodium diet. Keep leg elevation intermittently as instructed.     Allergies as of 03/31/2019      Reactions   Yellow Jacket Venom Anaphylaxis, Shortness Of Breath, Other (See Comments)   Throat closes up/eye swell shut   Nsaids Other (See Comments)   Stomach pain, Bleeding risk      Medication List    TAKE these medications   buPROPion 150 MG 12 hr tablet Commonly known as: WELLBUTRIN SR TAKE 1 TABLET BY MOUTH EVERY DAY   Eliquis DVT/PE Starter Pack 5 MG Tabs Take as directed on package: start with two-5mg  tablets twice daily for 7 days. On day 8, switch to one-5mg  tablet twice daily.   apixaban 5 MG Tabs tablet Commonly known as: Eliquis Take 1 tablet (5 mg total) by mouth 2 (two) times daily.   ferrous sulfate 325 (65 FE) MG EC tablet Take 325 mg by mouth every other day.   fluocinonide cream 0.05 % Commonly known as: LIDEX Apply 1 application topically 2 (two) times daily as needed (for skin rash/irritation). APPLY ON THE SKIN TWICE A DAY AS NEEDED FOR RASH   furosemide 40 MG tablet Commonly known as: LASIX Take 2 tablets (80 mg total) by mouth daily.   furosemide 20 MG tablet Commonly known as: Lasix Take 1 tablet (20 mg total) by mouth daily.   HYDROcodone-acetaminophen 5-325 MG tablet Commonly known as: NORCO/VICODIN Take 1 tablet by mouth 3 (three) times daily as needed (for pain.).   loperamide 2 MG tablet Commonly known as: IMODIUM A-D Take 2 mg by mouth as needed for diarrhea or loose stools.   mirtazapine  45 MG tablet Commonly known as: REMERON TAKE 1 TABLET (45 MG TOTAL) BY MOUTH AT BEDTIME.   multivitamin with minerals Tabs tablet Take 1 tablet by mouth daily.   nadolol 40 MG tablet Commonly known as: CORGARD TAKE 1 TABLET (40 MG TOTAL) BY MOUTH DAILY. What changed:   how much to take  how to take this  when to take this  additional instructions   omeprazole 40 MG capsule Commonly known as: PRILOSEC TAKE 1 CAPSULE BY MOUTH TWICE A DAY   PreviDent 5000 Booster Plus 1.1 % Pste Generic drug: Sodium Fluoride Place 1 application onto teeth 2 (two) times daily.   psyllium 58.6 % powder Commonly known as: METAMUCIL Take 1 packet by mouth at bedtime.   spironolactone 50 MG tablet Commonly known as: ALDACTONE Take 4 tablets (200 mg total) by mouth daily.   vitamin C 250 MG tablet Commonly known as: ASCORBIC ACID Take 250 mg by mouth daily.      Allergies  Allergen Reactions  . Yellow Jacket Venom Anaphylaxis, Shortness Of Breath and Other (See Comments)    Throat closes up/eye swell shut  . Nsaids Other (See Comments)    Stomach pain, Bleeding risk   Follow-up Information    Viviana Simpler I, MD. Schedule an appointment as soon as possible for a visit in 10 day(s).   Specialties: Internal Medicine, Pediatrics Contact  information: [place] [address] [phone]           The results of significant diagnostics from this hospitalization (including imaging, microbiology, ancillary and laboratory) are listed below for reference.    Significant Diagnostic Studies: Vas [place] Lower Extremity Venous (dvt)  Result Date: 03/30/2019  Lower Venous Study Indications: Pain, and Swelling.  Comparison Study: No prior study Performing Technologist: Maudry Mayhew MHA, RDMS, RVT, RDCS  Examination Guidelines: A complete evaluation includes B-mode imaging, spectral Doppler, color Doppler, and power Doppler as needed of all accessible portions of  each vessel. Bilateral testing is considered an integral part of a complete examination. Limited examinations for reoccurring indications may be performed as noted.  +---------+---------------+---------+-----------+----------+--------------+ RIGHT    CompressibilityPhasicitySpontaneityPropertiesThrombus Aging +---------+---------------+---------+-----------+----------+--------------+ CFV      None                    No                   Acute          +---------+---------------+---------+-----------+----------+--------------+ SFJ      None                                         Acute          +---------+---------------+---------+-----------+----------+--------------+ FV Prox  None                                         Acute          +---------+---------------+---------+-----------+----------+--------------+ FV Mid   None                                         Acute          +---------+---------------+---------+-----------+----------+--------------+ FV DistalNone                                         Acute          +---------+---------------+---------+-----------+----------+--------------+ PFV      None                                         Acute          +---------+---------------+---------+-----------+----------+--------------+ POP      Partial        Yes      Yes                  Acute          +---------+---------------+---------+-----------+----------+--------------+ PTV      None                                         Acute          +---------+---------------+---------+-----------+----------+--------------+ PERO     None  Acute          +---------+---------------+---------+-----------+----------+--------------+ GSV      None                                                        +---------+---------------+---------+-----------+----------+--------------+ EIV                               No                   Acute          +---------+---------------+---------+-----------+----------+--------------+ CIV                              Yes                                 +---------+---------------+---------+-----------+----------+--------------+   +----+---------------+---------+-----------+----------+--------------+ LEFTCompressibilityPhasicitySpontaneityPropertiesThrombus Aging +----+---------------+---------+-----------+----------+--------------+ CFV Full           Yes      Yes                                 +----+---------------+---------+-----------+----------+--------------+ EIV                         Yes                                 +----+---------------+---------+-----------+----------+--------------+ CIV                         Yes                                 +----+---------------+---------+-----------+----------+--------------+  Unable to visualize IVC.  Summary: Right: Findings consistent with acute deep vein thrombosis involving the right external iliac vein, right common femoral vein, right femoral vein, right proximal profunda vein, right popliteal vein, right posterior tibial veins, and right peroneal veins.  No cystic structure found in the popliteal fossa. Left: No evidence of common femoral vein obstruction.  *See table(s) above for measurements and observations. Electronically signed by Deitra Mayo MD on 03/30/2019 at 2:54:12 PM.    Final     Microbiology: Recent Results (from the past 240 hour(s))  SARS Coronavirus 2 [facility] order, Performed in [facility] hospital lab) Nasopharyngeal Nasopharyngeal Swab     Status: None   Collection Time: 03/30/19 12:06 PM   Specimen: Nasopharyngeal Swab  Result Value Ref Range Status   SARS Coronavirus 2 NEGATIVE NEGATIVE Final    Comment: (NOTE) If result is NEGATIVE SARS-CoV-2 target nucleic acids are NOT DETECTED. The SARS-CoV-2 RNA is generally detectable in upper and lower   respiratory specimens during the acute phase of infection. The lowest  concentration of SARS-CoV-2 viral copies this assay can detect is 250  copies / mL. A negative result does not preclude SARS-CoV-2 infection  and should not be used as the sole basis for treatment or other  patient management decisions.  A negative  result may occur with  improper specimen collection / handling, submission of specimen other  than nasopharyngeal swab, presence of viral mutation(s) within the  areas targeted by this assay, and inadequate number of viral copies  (<250 copies / mL). A negative result must be combined with clinical  observations, patient history, and epidemiological information. If result is POSITIVE SARS-CoV-2 target nucleic acids are DETECTED. The SARS-CoV-2 RNA is generally detectable in upper and lower  respiratory specimens dur ing the acute phase of infection.  Positive  results are indicative of active infection with SARS-CoV-2.  Clinical  correlation with patient history and other diagnostic information is  necessary to determine patient infection status.  Positive results do  not rule out bacterial infection or co-infection with other viruses. If result is PRESUMPTIVE POSTIVE SARS-CoV-2 nucleic acids MAY BE PRESENT.   A presumptive positive result was obtained on the submitted specimen  and confirmed on repeat testing.  While 2019 novel coronavirus  (SARS-CoV-2) nucleic acids may be present in the submitted sample  additional confirmatory testing may be necessary for epidemiological  and / or clinical management purposes  to differentiate between  SARS-CoV-2 and other Sarbecovirus currently known to infect humans.  If clinically indicated additional testing with an alternate test  methodology [phone]) is advised. The SARS-CoV-2 RNA is generally  detectable in upper and lower respiratory sp ecimens during the acute  phase of infection. The expected result is Negative. Fact  Sheet for Patients:  StrictlyIdeas.no Fact Sheet for Healthcare Providers: BankingDealers.co.za This test is not yet approved or cleared by the [place] FDA and has been authorized for detection and/or diagnosis of SARS-CoV-2 by FDA under an Emergency Use Authorization (EUA).  This EUA will remain in effect (meaning this test can be used) for the duration of the COVID-19 declaration under Section 564(b)(1) of the Act, 21 U.S.C. section 360bbb-3(b)(1), unless the authorization is terminated or revoked sooner. Performed at [facility], [place] [address] [place], [place] [address]      Labs: Basic Metabolic Panel: Recent Labs  Lab 03/30/19 1050 03/31/19 0441  NA 135 134*  K 4.5 4.9  CL 98 101  CO2 27 23  GLUCOSE 109* 106*  BUN 34* 25*  CREATININE 1.77* 1.25*  CALCIUM 9.0 8.6*   Liver Function Tests: Recent Labs  Lab 03/30/19 1050 03/31/19 0441  AST 36 30  ALT 16 15  ALKPHOS 71 63  BILITOT 1.7* 1.3*  PROT 7.6 6.8  ALBUMIN 3.4* 2.9*   CBC: Recent Labs  Lab 03/30/19 1050 03/31/19 0441  WBC 7.0 6.3  NEUTROABS 5.3  --   HGB 12.1* 11.7*  HCT 37.0* 36.7*  MCV 103.9* 104.0*  PLT 111* 105*    Signed:  Barton Dubois MD.  [facility] 03/31/2019, 8:58 AM

## 2019-04-02 ENCOUNTER — Other Ambulatory Visit: Payer: Self-pay

## 2019-04-02 ENCOUNTER — Encounter: Payer: Self-pay | Admitting: Internal Medicine

## 2019-04-02 ENCOUNTER — Ambulatory Visit (INDEPENDENT_AMBULATORY_CARE_PROVIDER_SITE_OTHER): Payer: 59 | Admitting: Internal Medicine

## 2019-04-02 ENCOUNTER — Ambulatory Visit: Payer: 59 | Admitting: Internal Medicine

## 2019-04-02 VITALS — BP 106/78 | HR 66 | Temp 97.2°F | Ht 75.0 in | Wt 169.0 lb

## 2019-04-02 DIAGNOSIS — G8929 Other chronic pain: Secondary | ICD-10-CM

## 2019-04-02 DIAGNOSIS — I82401 Acute embolism and thrombosis of unspecified deep veins of right lower extremity: Secondary | ICD-10-CM

## 2019-04-02 DIAGNOSIS — F1120 Opioid dependence, uncomplicated: Secondary | ICD-10-CM

## 2019-04-02 DIAGNOSIS — M549 Dorsalgia, unspecified: Secondary | ICD-10-CM

## 2019-04-02 DIAGNOSIS — N183 Chronic kidney disease, stage 3 unspecified: Secondary | ICD-10-CM

## 2019-04-02 DIAGNOSIS — D696 Thrombocytopenia, unspecified: Secondary | ICD-10-CM

## 2019-04-02 LAB — CBC
HCT: 33.7 % — ABNORMAL LOW (ref 39.0–52.0)
Hemoglobin: 11.3 g/dL — ABNORMAL LOW (ref 13.0–17.0)
MCHC: 33.6 g/dL (ref 30.0–36.0)
MCV: 102.6 fl — ABNORMAL HIGH (ref 78.0–100.0)
Platelets: 114 10*3/uL — ABNORMAL LOW (ref 150.0–400.0)
RBC: 3.28 Mil/uL — ABNORMAL LOW (ref 4.22–5.81)
RDW: 14.6 % (ref 11.5–15.5)
WBC: 6 10*3/uL (ref 4.0–10.5)

## 2019-04-02 LAB — RENAL FUNCTION PANEL
Albumin: 3.1 g/dL — ABNORMAL LOW (ref 3.5–5.2)
BUN: 26 mg/dL — ABNORMAL HIGH (ref 6–23)
CO2: 29 mEq/L (ref 19–32)
Calcium: 8.9 mg/dL (ref 8.4–10.5)
Chloride: 101 mEq/L (ref 96–112)
Creatinine, Ser: 1.3 mg/dL (ref 0.40–1.50)
GFR: 54.41 mL/min — ABNORMAL LOW (ref >60.00)
Glucose, Bld: 112 mg/dL — ABNORMAL HIGH (ref 70–99)
Phosphorus: 2.7 mg/dL (ref 2.3–4.6)
Potassium: 4.8 mEq/L (ref 3.5–5.1)
Sodium: 136 mEq/L (ref 135–145)

## 2019-04-02 LAB — LIPID PANEL
Cholesterol: 112 mg/dL (ref 0–200)
HDL: 28.6 mg/dL — ABNORMAL LOW (ref >39.00)
LDL Cholesterol: 65 mg/dL (ref 0–99)
NonHDL: 83.23
Total CHOL/HDL Ratio: 4
Triglycerides: 89 mg/dL (ref 0.0–149.0)
VLDL: 17.8 mg/dL (ref 0.0–40.0)

## 2019-04-02 MED ORDER — HYDROCODONE-ACETAMINOPHEN 5-325 MG PO TABS: 1.0000 | ORAL_TABLET | Freq: Three times a day (TID) | ORAL | 0 refills | Status: DC | PRN

## 2019-04-02 NOTE — Assessment & Plan Note (Signed)
New findings Mild as off discharge Will check again

## 2019-04-02 NOTE — Assessment & Plan Note (Signed)
Ongoing pain but acceptable control with tid hydrocodone

## 2019-04-02 NOTE — Progress Notes (Signed)
Subjective:    Patient ID: Billy [name], male    DOB: 09-Oct-1945, 73 y.o.   MRN: [id number]  HPI Here for hospital follow up Had been walking dog in the afternoon as usual Right leg felt "tired" and he noticed considerable swelling Thought it was fluid and related to his cirrhosis Saw Dr Aris [place] for ultrasound Found extensive right leg clot---sent to ER  Got IV heparin for 1 day Started on eliquis Gained 6# Leg feels better---very little swelling and no pain now  Wearing compression socks all the time Hasn't had any injury or increased sitting, travel, etc Chronic low platelets also  Ongoing back pain Up in thoracic area at times Lumbar and into butt Worst in late afternoon ---when pills wearing off Satisfied with current regimen  Renal insufficiency noted GFR in 30's--then up into 50's  Current Outpatient Medications on File Prior to Visit  Medication Sig Dispense Refill  . apixaban (ELIQUIS) 5 MG TABS tablet Take 1 tablet (5 mg total) by mouth 2 (two) times daily. 60 tablet 3  . buPROPion (WELLBUTRIN SR) 150 MG 12 hr tablet TAKE 1 TABLET BY MOUTH EVERY DAY (Patient taking differently: Take 150 mg by mouth daily. ) 90 tablet 3  . Eliquis DVT/PE Starter Pack (ELIQUIS STARTER PACK) 5 MG TABS Take as directed on package: start with two-5mg  tablets twice daily for 7 days. On day 8, switch to one-5mg  tablet twice daily. 1 each 0  . ferrous sulfate 325 (65 FE) MG EC tablet Take 325 mg by mouth every other day.    . fluocinonide cream (LIDEX) [id number] % Apply 1 application topically 2 (two) times daily as needed (for skin rash/irritation). APPLY ON THE SKIN TWICE A DAY AS NEEDED FOR RASH 60 g 2  . furosemide (LASIX) 20 MG tablet Take 1 tablet (20 mg total) by mouth daily. 90 tablet 1  . furosemide (LASIX) 40 MG tablet Take 2 tablets (80 mg total) by mouth daily. 180 tablet 1  . HYDROcodone-acetaminophen (NORCO/VICODIN) 5-325 MG tablet Take 1 tablet by mouth 3 (three) times  daily as needed (for pain.). 90 tablet 0  . loperamide (IMODIUM A-D) 2 MG tablet Take 2 mg by mouth as needed for diarrhea or loose stools.    . mirtazapine (REMERON) 45 MG tablet TAKE 1 TABLET (45 MG TOTAL) BY MOUTH AT BEDTIME. 90 tablet 3  . Multiple Vitamin (MULTIVITAMIN WITH MINERALS) TABS Take 1 tablet by mouth daily.     . nadolol (CORGARD) 40 MG tablet TAKE 1 TABLET (40 MG TOTAL) BY MOUTH DAILY. (Patient taking differently: Take 40 mg by mouth daily. ) 90 tablet 3  . omeprazole (PRILOSEC) 40 MG capsule TAKE 1 CAPSULE BY MOUTH TWICE A DAY (Patient taking differently: Take 40 mg by mouth 2 (two) times daily. ) 180 capsule 3  . PREVIDENT 5000 BOOSTER PLUS 1.1 % PSTE Place 1 application onto teeth 2 (two) times daily.   5  . psyllium (METAMUCIL) 58.6 % powder Take 1 packet by mouth at bedtime.     Marland Kitchen spironolactone (ALDACTONE) 50 MG tablet Take 4 tablets (200 mg total) by mouth daily. 360 tablet 2  . vitamin C (ASCORBIC ACID) 250 MG tablet Take 250 mg by mouth daily.      No current facility-administered medications on file prior to visit.     Allergies  Allergen Reactions  . [place]et Venom Anaphylaxis, Shortness Of Breath and Other (See Comments)    Throat closes up/eye swell shut  .  Nsaids Other (See Comments)    Stomach pain, Bleeding risk    Past Medical History:  Diagnosis Date  . Acute posthemorrhagic anemia   . Alcoholic cirrhosis of liver ([place]) 09/24/2011   Years of alcohol use. Progressed from fatty liver to cirrhosis based upon review imaging. Manifestations are portal hypertension with esophageal varices, portal gastropathy, rectal varices and he may have portal colopathy. Ascites also present. Naive to HAV and HBV Claims he has had pneumococcal vaccine   . Alcoholism ([place])   . Angiodysplasia of colon 09/11/2011   Multiple in the right colon, ablated with APC. Question if this is portal colopathy.   . Anxiety   . Back pain   . Basal cell carcinoma   . Bilateral  varicoceles   . Campylobacter diarrhea 01/07/2012  . Cataract    removed  . DDD (degenerative disc disease), lumbar   . Depression    mild at most  . Diverticulosis of colon   . Erectile dysfunction   . Gastric ulcer 08/2011   small, antral ulcer  . GERD (gastroesophageal reflux disease)    takes Omeprazole prn  . GI hemorrhage 2006, 2013   esophagitis and 1+ varices, melena in 2013   . Grade I diastolic dysfunction   . Hearing loss   . History of ascites   . History of blood transfusion    as a child and no abnormal reaction noted  . History of bronchitis    >56yrs ago  . History of dizziness   . Hydrocele   . Hypertension    takes Nadolol daily  . Narcotic dependence ([place])   . Personal history of colonic polyps   . Portal hypertension ([place]) 08/2011  . 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 Right hydrocele   . Scrotal edema   . Sleep disturbance    takes Remeron nightly  . Splenomegaly   . Thrombocytopenia, unspecified ([place])   . Varices, esophageal ([place]) 08/2011   Grade 2, mid-esophagus    Past Surgical History:  Procedure Laterality Date  . BIOPSY  08/27/2018   Procedure: BIOPSY;  Surgeon: Milus Banister, MD;  Location: [facility];  Service: Endoscopy;;  . CATARACT EXTRACTION  2013   bilateral with IOL  . CHOLECYSTECTOMY  12/31/2012   Procedure: LAPAROSCOPIC CHOLECYSTECTOMY;  Surgeon: Zenovia Jarred, MD;  Location: [facility];  Service: General;;  . COLONOSCOPY  2001, 200411/02/2006  . COLONOSCOPY  09/11/2011   Procedure: COLONOSCOPY;  Surgeon: Gatha Mayer, MD;  Location: [facility];  Service: Endoscopy;  Laterality: N/A;  . COLONOSCOPY    . ESOPHAGEAL BANDING N/A 05/02/2017   Procedure: ESOPHAGEAL BANDING;  Surgeon: Gatha Mayer, MD;  Location: [facility];  Service: Endoscopy;  Laterality: N/A;  . ESOPHAGEAL BANDING  06/09/2018   Procedure: ESOPHAGEAL BANDING;  Surgeon: Gatha Mayer, MD;  Location: [facility];  Service: Endoscopy;;  . ESOPHAGEAL BANDING   08/27/2018   Procedure: ESOPHAGEAL BANDING;  Surgeon: Milus Banister, MD;  Location: [facility];  Service: Endoscopy;;  . ESOPHAGOGASTRODUODENOSCOPY  2006  . ESOPHAGOGASTRODUODENOSCOPY  09/11/2011   Procedure: ESOPHAGOGASTRODUODENOSCOPY (EGD);  Surgeon: Gatha Mayer, MD;  Location: [facility];  Service: Endoscopy;  Laterality: N/A;  . ESOPHAGOGASTRODUODENOSCOPY N/A 08/27/2018   Procedure: ESOPHAGOGASTRODUODENOSCOPY (EGD);  Surgeon: Milus Banister, MD;  Location: [facility];  Service: Endoscopy;  Laterality: N/A;  . ESOPHAGOGASTRODUODENOSCOPY (EGD) WITH PROPOFOL N/A 04/13/2017   Procedure: ESOPHAGOGASTRODUODENOSCOPY (EGD) WITH PROPOFOL;  Surgeon: Doran Stabler, MD;  Location: [facility];  Service: Gastroenterology;  Laterality: N/A;  . ESOPHAGOGASTRODUODENOSCOPY (EGD) WITH PROPOFOL N/A 05/02/2017   Procedure: ESOPHAGOGASTRODUODENOSCOPY (EGD) WITH PROPOFOL;  Surgeon: Gatha Mayer, MD;  Location: [facility];  Service: Endoscopy;  Laterality: N/A;  . ESOPHAGOGASTRODUODENOSCOPY (EGD) WITH PROPOFOL N/A 09/30/2017   Procedure: ESOPHAGOGASTRODUODENOSCOPY (EGD) WITH PROPOFOL;  Surgeon: Gatha Mayer, MD;  [facility];  Service: Endoscopy;  Laterality: N/A;  . ESOPHAGOGASTRODUODENOSCOPY (EGD) WITH PROPOFOL N/A 06/09/2018   Procedure: ESOPHAGOGASTRODUODENOSCOPY (EGD) WITH PROPOFOL;  Surgeon: Gatha Mayer, MD;  Location: [facility];  Service: Endoscopy;  Laterality: N/A;  . ESOPHAGOGASTRODUODENOSCOPY (EGD) WITH PROPOFOL N/A 01/05/2019   Procedure: ESOPHAGOGASTRODUODENOSCOPY (EGD) WITH PROPOFOL;  Surgeon: Gatha Mayer, MD;  Location: [facility];  Service: Endoscopy;  Laterality: N/A;  . EUS N/A 08/27/2018   Procedure: UPPER ENDOSCOPIC ULTRASOUND (EUS) RADIAL;  Surgeon: Milus Banister, MD;  Location: [facility];  Service: Endoscopy;  Laterality: N/A;  . EYE SURGERY    . FLEXIBLE SIGMOIDOSCOPY  01/07/2012   Procedure: FLEXIBLE SIGMOIDOSCOPY;  Surgeon: Lafayette Dragon,  MD;  Location: [facility];  Service: Endoscopy;  Laterality: N/A;  . KNEE ARTHROSCOPY  2008   Right  . KNEE ARTHROSCOPY  2008   Left  . POLYPECTOMY    . [place] THORA/PARACENTESIS  10/28/2011  . Varicocele repair  75's    Family History  Problem Relation Age of Onset  . Stroke Mother   . Diabetes Mother   . Lung cancer Brother   . Prostate cancer Brother   . Liver cancer Cousin   . Liver cancer Paternal Uncle   . Colon cancer Neg Hx   . Stomach cancer Neg Hx   . Esophageal cancer Neg Hx   . Colon polyps Neg Hx   . Rectal cancer Neg Hx   . Pancreatic cancer Neg Hx     Social History   Socioeconomic History  . Marital status: Married    Spouse name: Not on file  . Number of children: 1  . Years of education: Not on file  . Highest education level: Not on file  Occupational History  . Occupation: Retired    Comment: mostly from Research officer, political party Needs  . Financial resource strain: Not on file  . Food insecurity    Worry: Not on file    Inability: Not on file  . Transportation needs    Medical: Not on file    Non-medical: Not on file  Tobacco Use  . Smoking status: Light Tobacco Smoker    Packs/day: 0.50    Types: Cigarettes, E-cigarettes  . Smokeless tobacco: Never Used  Substance and Sexual Activity  . Alcohol use: Not Currently    Alcohol/week: 0.0 standard drinks  . Drug use: Yes    Types: Marijuana    Comment: occasionally, last time couple of weeks ago  . Sexual activity: Never  Lifestyle  . Physical activity    Days per week: Not on file    Minutes per session: Not on file  . Stress: Not on file  Relationships  . Social Herbalist on phone: Not on file    Gets together: Not on file    Attends religious service: Not on file    Active member of club or organization: Not on file    Attends meetings of clubs or organizations: Not on file    Relationship status: Not on file  . Intimate partner violence    Fear of current or ex partner: Not  on  file    Emotionally abused: Not on file    Physically abused: Not on file    Forced sexual activity: Not on file  Other Topics Concern  . Not on file  Social History Narrative   Married- 2nd   1 son (adopted) from previous marriage      Has living will   Wife is  health care POA---alternate is son   Would accept attempts at resuscitation   No tube feeds if cognitively unaware      Review of Systems  No chest pain No SOB Appetite "stinks"---relates to all his meds, etc. Doesn't really eat lunch     Objective:   Physical Exam  Constitutional: He appears well-developed. No distress.  Neck: No thyromegaly present.  Cardiovascular: Normal rate, regular rhythm and normal heart sounds. Exam reveals no gallop.  No murmur heard. Respiratory: Effort normal and breath sounds normal. No respiratory distress. He has no wheezes. He has no rales.  GI: Soft. There is no abdominal tenderness.  Musculoskeletal:     Comments: No calf swelling or tenderness now  Lymphadenopathy:    He has no cervical adenopathy.  Psychiatric: He has a normal mood and affect. His behavior is normal.           Assessment & Plan:

## 2019-04-02 NOTE — Assessment & Plan Note (Signed)
No concerns on PDMP 

## 2019-04-02 NOTE — Assessment & Plan Note (Signed)
Very puzzling with his cirrhosis and low platelets Unprovoked Plan is for 6 months of eliquis He will watch for abnormal bleeding

## 2019-04-02 NOTE — Assessment & Plan Note (Signed)
Mild and stable Will recheck CBC due to anemia

## 2019-04-02 NOTE — Telephone Encounter (Signed)
Name of Medication: Hydrocodone Name of Pharmacy: [facility] [place] or Written Date and Quantity: 03-08-19 #90 Last Office Visit and Type: today Next Office Visit and Type:  Last Controlled Substance Agreement Date: 09-22-18 Last UDS: 09-22-18   Patient comment: I did go over a few tablets this month but I do have enough for the weekend. If I could pick these up Monday that would be okay. Thanks, [place]

## 2019-04-05 NOTE — Addendum Note (Signed)
Addended by: Viviana Simpler I on: 04/05/2019 10:40 AM   Modules accepted: Level of Service

## 2019-04-30 ENCOUNTER — Other Ambulatory Visit: Payer: Self-pay

## 2019-04-30 MED ORDER — HYDROCODONE-ACETAMINOPHEN 5-325 MG PO TABS: 1.0000 | ORAL_TABLET | Freq: Three times a day (TID) | ORAL | 0 refills | Status: DC | PRN

## 2019-04-30 NOTE — Telephone Encounter (Signed)
Last office visit 04/02/2019 for Hospital and Narcotic follow up.  Last refilled 04/02/2019 for #90 with no refills. UDS 09/22/2018/Contract 09/08/2018.Marland Kitchen  Next Appt: 07/02/2019 for 3 month follow up.

## 2019-05-10 ENCOUNTER — Telehealth: Payer: Self-pay | Admitting: Internal Medicine

## 2019-05-10 ENCOUNTER — Ambulatory Visit (INDEPENDENT_AMBULATORY_CARE_PROVIDER_SITE_OTHER): Payer: 59 | Admitting: Internal Medicine

## 2019-05-10 ENCOUNTER — Encounter: Payer: Self-pay | Admitting: Internal Medicine

## 2019-05-10 ENCOUNTER — Other Ambulatory Visit (INDEPENDENT_AMBULATORY_CARE_PROVIDER_SITE_OTHER): Payer: 59

## 2019-05-10 VITALS — BP 96/50 | HR 68 | Temp 96.3°F | Ht 75.0 in | Wt 166.0 lb

## 2019-05-10 DIAGNOSIS — R42 Dizziness and giddiness: Secondary | ICD-10-CM

## 2019-05-10 DIAGNOSIS — I8511 Secondary esophageal varices with bleeding: Secondary | ICD-10-CM | POA: Diagnosis not present

## 2019-05-10 DIAGNOSIS — K7031 Alcoholic cirrhosis of liver with ascites: Secondary | ICD-10-CM

## 2019-05-10 DIAGNOSIS — R188 Other ascites: Secondary | ICD-10-CM

## 2019-05-10 DIAGNOSIS — N433 Hydrocele, unspecified: Secondary | ICD-10-CM

## 2019-05-10 LAB — CBC WITH DIFFERENTIAL/PLATELET
Basophils Absolute: 0.1 10*3/uL (ref 0.0–0.1)
Basophils Relative: 1.2 % (ref 0.0–3.0)
Eosinophils Absolute: 0.1 10*3/uL (ref 0.0–0.7)
Eosinophils Relative: 1.5 % (ref 0.0–5.0)
HCT: 24.9 % — ABNORMAL LOW (ref 39.0–52.0)
Hemoglobin: 8.1 g/dL — ABNORMAL LOW (ref 13.0–17.0)
Lymphocytes Relative: 6.8 % — ABNORMAL LOW (ref 12.0–46.0)
Lymphs Abs: 0.5 10*3/uL — ABNORMAL LOW (ref 0.7–4.0)
MCHC: 32.8 g/dL (ref 30.0–36.0)
MCV: 99 fl (ref 78.0–100.0)
Monocytes Absolute: 0.8 10*3/uL (ref 0.1–1.0)
Monocytes Relative: 11 % (ref 3.0–12.0)
Neutro Abs: 5.8 10*3/uL (ref 1.4–7.7)
Neutrophils Relative %: 79.5 % — ABNORMAL HIGH (ref 43.0–77.0)
Platelets: 177 10*3/uL (ref 150.0–400.0)
RBC: 2.51 Mil/uL — ABNORMAL LOW (ref 4.22–5.81)
RDW: 13.9 % (ref 11.5–15.5)
WBC: 7.2 10*3/uL (ref 4.0–10.5)

## 2019-05-10 LAB — COMPREHENSIVE METABOLIC PANEL
ALT: 12 U/L (ref 0–53)
AST: 22 U/L (ref 0–37)
Albumin: 2.9 g/dL — ABNORMAL LOW (ref 3.5–5.2)
Alkaline Phosphatase: 59 U/L (ref 39–117)
BUN: 31 mg/dL — ABNORMAL HIGH (ref 6–23)
CO2: 28 mEq/L (ref 19–32)
Calcium: 8.5 mg/dL (ref 8.4–10.5)
Chloride: 100 mEq/L (ref 96–112)
Creatinine, Ser: 1.77 mg/dL — ABNORMAL HIGH (ref 0.40–1.50)
GFR: 38.1 mL/min — ABNORMAL LOW (ref >60.00)
Glucose, Bld: 127 mg/dL — ABNORMAL HIGH (ref 70–99)
Potassium: 4.3 mEq/L (ref 3.5–5.1)
Sodium: 134 mEq/L — ABNORMAL LOW (ref 135–145)
Total Bilirubin: 0.8 mg/dL (ref 0.2–1.2)
Total Protein: 6.2 g/dL (ref 6.0–8.3)

## 2019-05-10 LAB — AMMONIA: Ammonia: 45 umol/L — ABNORMAL HIGH (ref 11–35)

## 2019-05-10 NOTE — Assessment & Plan Note (Addendum)
worse swelling of the scrotum from ascites more than the hydrocele

## 2019-05-10 NOTE — Addendum Note (Signed)
Addended by: [place], Johnpaul Gillentine E on: 05/10/2019 03:49 PM   Modules accepted: Orders

## 2019-05-10 NOTE — Assessment & Plan Note (Addendum)
Ascites still a problem I think we need to consider TIPS  Orders Placed This Encounter  Procedures  . CBC with Differential/Platelet  . Comprehensive metabolic panel  . Ammonia  . Ambulatory referral to Interventional Radiology

## 2019-05-10 NOTE — Assessment & Plan Note (Addendum)
?   TIPS not so much for this but it would eliminate the need for beta-blocker and repeat EGD.

## 2019-05-10 NOTE — Assessment & Plan Note (Addendum)
Extension into scrotum on very large doses of diuretics and some possible side effects- TIPS evaluation.  I explained this to them and specifically the complication of hepatic encephalopathy though he is never had that so would think you could be managed medically.  Handout regarding tips provided as well.  He needs an echocardiogram to evaluate ventricular function as a work-up for possible TIPS and I did not think of that when he was here we will call him to set that up.

## 2019-05-10 NOTE — Assessment & Plan Note (Signed)
Not really orthostatic today but symptomatic and certainly his diuretics but his beta-blocker may be coming into play.  I do not want to reduce his diuretics given the level of ascites.  Reduce nadolol to 20 mg daily to see if that helps.

## 2019-05-10 NOTE — Patient Instructions (Addendum)
Your provider has requested that you go to the basement level for lab work before leaving today. Press "B" on the elevator. The lab is located at the first door on the left as you exit the elevator.   We have put in a referral to the Interventional Radiology department for a consult. They will contact you.  We have set you up an appointment for an ECHO to be done 05/18/2019 at 10:30AM, arrive at 10:00AM, this will be done at [address] 3rd floor, their phone # is [phone]. No prep, just wear a mask.   I appreciate the opportunity to care for you. Silvano Rusk, MD, [facility]

## 2019-05-10 NOTE — Telephone Encounter (Signed)
I have called him back and given him the date /time of appointment.

## 2019-05-10 NOTE — Telephone Encounter (Signed)
Pt called stating that he was returning your call. Pls call him again.

## 2019-05-10 NOTE — Progress Notes (Signed)
Billy [name] 73 y.o. 05/30/1946 [id number]  Assessment & Plan:   Alcoholic cirrhosis of liver (HCC) Ascites still a problem I think we need to consider TIPS  Orders Placed This Encounter  Procedures  . CBC with Differential/Platelet  . Comprehensive metabolic panel  . Ammonia  . Ambulatory referral to Interventional Radiology     Ascites Extension into scrotum on very large doses of diuretics and some possible side effects- TIPS evaluation.  I explained this to them and specifically the complication of hepatic encephalopathy though he is never had that so would think you could be managed medically.  Handout regarding tips provided as well.  He needs an echocardiogram to evaluate ventricular function as a work-up for possible TIPS and I did not think of that when he was here we will call him to set that up.  Secondary esophageal varices with bleeding (HCC) ? TIPS not so much for this but it would eliminate the need for beta-blocker and repeat EGD.   Hydrocele in adult worse swelling of the scrotum from ascites more than the hydrocele  Light headed Not really orthostatic today but symptomatic and certainly his diuretics but his beta-blocker may be coming into play.  I do not want to reduce his diuretics given the level of ascites.  Reduce nadolol to 20 mg daily to see if that helps.    I appreciate the opportunity to care for this patient. CC: [name], MD  Subjective:   Chief Complaint: Follow-up of cirrhosis and ascites  HPI The patient returns, last interaction was by phone in my chart, he had acute swelling of the lower extremity and turned out to have a DVT.  He is on Eliquis and though he is having some increased bruising the swelling and pain in his right lower extremity are gone.  He has been having problem with some fatigue and weakness and intermittent periods where he also feels lightheaded when standing gets intermittent and unpredictable.  He  has not fallen or passed out.  He held his Eliquis 1 day and he still had it, when he held spironolactone and furosemide he did not have it.  He has been on nadolol as well for some time at a stable dose of 40 mg.  Scrotal edema is a big problem again despite increasing doses of diuretics.   Wt Readings from Last 3 Encounters:  05/10/19 166 lb (75.3 kg)  04/02/19 169 lb (76.7 kg)  03/30/19 163 lb (73.9 kg)    Allergies  Allergen Reactions  . Yellow Jacket Venom Anaphylaxis, Shortness Of Breath and Other (See Comments)    Throat closes up/eye swell shut  . Nsaids Other (See Comments)    Stomach pain, Bleeding risk   Current Meds  Medication Sig  . apixaban (ELIQUIS) 5 MG TABS tablet Take 1 tablet (5 mg total) by mouth 2 (two) times daily.  Marland Kitchen buPROPion (WELLBUTRIN SR) 150 MG 12 hr tablet TAKE 1 TABLET BY MOUTH EVERY DAY (Patient taking differently: Take 150 mg by mouth daily. )  . Eliquis DVT/PE Starter Pack (ELIQUIS STARTER PACK) 5 MG TABS Take as directed on package: start with two-5mg  tablets twice daily for 7 days. On day 8, switch to one-5mg  tablet twice daily. (Patient taking differently: Take 5 mg by mouth daily. T)  . ferrous sulfate 325 (65 FE) MG EC tablet Take 325 mg by mouth every other day.  . fluocinonide cream (LIDEX) [id number] % Apply 1 application topically 2 (two) times  daily as needed (for skin rash/irritation). APPLY ON THE SKIN TWICE A DAY AS NEEDED FOR RASH  . furosemide (LASIX) 20 MG tablet Take 1 tablet (20 mg total) by mouth daily.  . furosemide (LASIX) 40 MG tablet Take 2 tablets (80 mg total) by mouth daily.  Marland Kitchen HYDROcodone-acetaminophen (NORCO/VICODIN) 5-325 MG tablet Take 1 tablet by mouth 3 (three) times daily as needed (for pain.).  Marland Kitchen loperamide (IMODIUM A-D) 2 MG tablet Take 2 mg by mouth as needed for diarrhea or loose stools.  . mirtazapine (REMERON) 45 MG tablet TAKE 1 TABLET (45 MG TOTAL) BY MOUTH AT BEDTIME.  . Multiple Vitamin (MULTIVITAMIN WITH MINERALS)  TABS Take 1 tablet by mouth daily.   . nadolol (CORGARD) 40 MG tablet Take 20 mg by mouth daily.  Marland Kitchen omeprazole (PRILOSEC) 40 MG capsule TAKE 1 CAPSULE BY MOUTH TWICE A DAY (Patient taking differently: Take 40 mg by mouth 2 (two) times daily. )  . PREVIDENT 5000 BOOSTER PLUS 1.1 % PSTE Place 1 application onto teeth 2 (two) times daily.   . psyllium (METAMUCIL) 58.6 % powder Take 1 packet by mouth at bedtime.   Marland Kitchen spironolactone (ALDACTONE) 50 MG tablet Take 4 tablets (200 mg total) by mouth daily.  . vitamin C (ASCORBIC ACID) 250 MG tablet Take 250 mg by mouth daily.   . [DISCONTINUED] nadolol (CORGARD) 40 MG tablet TAKE 1 TABLET (40 MG TOTAL) BY MOUTH DAILY. (Patient taking differently: Take 40 mg by mouth daily. )   Past Medical History:  Diagnosis Date  . Acute posthemorrhagic anemia   . Alcoholic cirrhosis of liver ([place]) 09/24/2011   Years of alcohol use. Progressed from fatty liver to cirrhosis based upon review imaging. Manifestations are portal hypertension with esophageal varices, portal gastropathy, rectal varices and he may have portal colopathy. Ascites also present. Naive to HAV and HBV Claims he has had pneumococcal vaccine   . Alcoholism ([place])   . Angiodysplasia of colon 09/11/2011   Multiple in the right colon, ablated with APC. Question if this is portal colopathy.   . Anxiety   . Back pain   . Basal cell carcinoma   . Bilateral varicoceles   . Campylobacter diarrhea 01/07/2012  . Cataract    removed  . DDD (degenerative disc disease), lumbar   . Depression    mild at most  . Diverticulosis of colon   . Erectile dysfunction   . Gastric ulcer 08/2011   small, antral ulcer  . GERD (gastroesophageal reflux disease)    takes Omeprazole prn  . GI hemorrhage 2006, 2013   esophagitis and 1+ varices, melena in 2013   . Grade I diastolic dysfunction   . Hearing loss   . History of ascites   . History of blood transfusion    as a child and no abnormal reaction noted  .  History of bronchitis    >51yrs ago  . History of dizziness   . Hydrocele   . Hypertension    takes Nadolol daily  . Narcotic dependence ([place])   . Personal history of colonic polyps   . Portal hypertension ([place]) 08/2011  . 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 Right hydrocele   . Scrotal edema   . Sleep disturbance    takes Remeron nightly  . Splenomegaly   . Thrombocytopenia, unspecified ([place])   . Varices, esophageal ( [place]) 08/2011   Grade 2, mid-esophagus   Past Surgical History:  Procedure Laterality Date  . BIOPSY  08/27/2018   Procedure:  BIOPSY;  Surgeon: Milus Banister, MD;  Location: [facility];  Service: Endoscopy;;  . CATARACT EXTRACTION  2013   bilateral with IOL  . CHOLECYSTECTOMY  12/31/2012   Procedure: LAPAROSCOPIC CHOLECYSTECTOMY;  Surgeon: Zenovia Jarred, MD;  Location: [facility];  Service: General;;  . COLONOSCOPY  2001, 200411/02/2006  . COLONOSCOPY  09/11/2011   Procedure: COLONOSCOPY;  Surgeon: Gatha Mayer, MD;  Location: [facility];  Service: Endoscopy;  Laterality: N/A;  . COLONOSCOPY    . ESOPHAGEAL BANDING N/A 05/02/2017   Procedure: ESOPHAGEAL BANDING;  Surgeon: Gatha Mayer, MD;  Location: [facility];  Service: Endoscopy;  Laterality: N/A;  . ESOPHAGEAL BANDING  06/09/2018   Procedure: ESOPHAGEAL BANDING;  Surgeon: Gatha Mayer, MD;  Location: [facility];  Service: Endoscopy;;  . ESOPHAGEAL BANDING  08/27/2018   Procedure: ESOPHAGEAL BANDING;  Surgeon: Milus Banister, MD;  Location: [facility];  Service: Endoscopy;;  . ESOPHAGOGASTRODUODENOSCOPY  2006  . ESOPHAGOGASTRODUODENOSCOPY  09/11/2011   Procedure: ESOPHAGOGASTRODUODENOSCOPY (EGD);  Surgeon: Gatha Mayer, MD;  Location: [facility];  Service: Endoscopy;  Laterality: N/A;  . ESOPHAGOGASTRODUODENOSCOPY N/A 08/27/2018   Procedure: ESOPHAGOGASTRODUODENOSCOPY (EGD);  Surgeon: Milus Banister, MD;  Location: [facility];  Service: Endoscopy;  Laterality: N/A;  . ESOPHAGOGASTRODUODENOSCOPY  (EGD) WITH PROPOFOL N/A 04/13/2017   Procedure: ESOPHAGOGASTRODUODENOSCOPY (EGD) WITH PROPOFOL;  Surgeon: Doran Stabler, MD;  Location: [facility];  Service: Gastroenterology;  Laterality: N/A;  . ESOPHAGOGASTRODUODENOSCOPY (EGD) WITH PROPOFOL N/A 05/02/2017   Procedure: ESOPHAGOGASTRODUODENOSCOPY (EGD) WITH PROPOFOL;  Surgeon: Gatha Mayer, MD;  Location: [facility];  Service: Endoscopy;  Laterality: N/A;  . ESOPHAGOGASTRODUODENOSCOPY (EGD) WITH PROPOFOL N/A 09/30/2017   Procedure: ESOPHAGOGASTRODUODENOSCOPY (EGD) WITH PROPOFOL;  Surgeon: Gatha Mayer, MD;  Location: [facility];  Service: Endoscopy;  Laterality: N/A;  . ESOPHAGOGASTRODUODENOSCOPY (EGD) WITH PROPOFOL N/A 06/09/2018   Procedure: ESOPHAGOGASTRODUODENOSCOPY (EGD) WITH PROPOFOL;  Surgeon: Gatha Mayer, MD;  Location: [facility];  Service: Endoscopy;  Laterality: N/A;  . ESOPHAGOGASTRODUODENOSCOPY (EGD) WITH PROPOFOL N/A 01/05/2019   Procedure: ESOPHAGOGASTRODUODENOSCOPY (EGD) WITH PROPOFOL;  Surgeon: Gatha Mayer, MD;  Location: [facility];  Service: Endoscopy;  Laterality: N/A;  . EUS N/A 08/27/2018   Procedure: UPPER ENDOSCOPIC ULTRASOUND (EUS) RADIAL;  Surgeon: Milus Banister, MD;  Location: [facility];  Service: Endoscopy;  Laterality: N/A;  . EYE SURGERY    . FLEXIBLE SIGMOIDOSCOPY  01/07/2012   Procedure: FLEXIBLE SIGMOIDOSCOPY;  Surgeon: Lafayette Dragon, MD;  Location: [facility];  Service: Endoscopy;  Laterality: N/A;  . KNEE ARTHROSCOPY  2008   Right  . KNEE ARTHROSCOPY  2008   Left  . POLYPECTOMY    . [place] THORA/PARACENTESIS  10/28/2011  . Varicocele repair  60's   Social History   Social History Narrative   Married- 2nd   1 son (adopted) from previous marriage      Has living will   Wife is  health care POA---alternate is son   Would accept attempts at resuscitation   No tube feeds if cognitively unaware      family history includes Diabetes in his mother; Liver cancer in his cousin  and paternal uncle; Lung cancer in his brother; Prostate cancer in his brother; Stroke in his mother.   Review of Systems As above  Objective:   Physical Exam BP (!) 110/52 (BP Location: Left Arm, Patient Position: Sitting, Cuff Size: Normal)   Pulse 67   Temp (!) 96.3 F (35.7 C) (Oral)  Ht 6\' 3"  (1.905 m)   Wt 166 lb (75.3 kg)   BMI 20.75 kg/m  @BP  (!) 110/52 (BP Location: Left Arm, Patient Position: Sitting, Cuff Size: Normal)   Pulse 67   Temp (!) 96.3 F (35.7 C) (Oral)   Ht 6\' 3"  (1.905 m)   Wt 166 lb (75.3 kg)   BMI 20.75 kg/m @  Orthostatics see VS summary General:  NAD, thin Eyes:   anicteric Lungs:  clear Heart::  S1S2 no rubs, murmurs or gallops Abdomen:  Mod ascites soft and nontender, BS+ GU:  Marked scrotal edema Ext:   Trace bilat LE edema pretibial, Neuro:  A and O x 3 and no asterixis Skin:  Ecchymoses scattederd and some spiders  Data Reviewed:   Hospital records labs primary care not the last visit with me

## 2019-05-11 ENCOUNTER — Other Ambulatory Visit: Payer: Self-pay | Admitting: Internal Medicine

## 2019-05-11 DIAGNOSIS — D500 Iron deficiency anemia secondary to blood loss (chronic): Secondary | ICD-10-CM

## 2019-05-11 DIAGNOSIS — N183 Chronic kidney disease, stage 3 unspecified: Secondary | ICD-10-CM

## 2019-05-11 MED ORDER — MIDODRINE HCL 2.5 MG PO TABS: 2.5000 mg | ORAL_TABLET | Freq: Three times a day (TID) | ORAL | 1 refills | Status: DC

## 2019-05-11 NOTE — Progress Notes (Signed)
Correction labs on 11/2

## 2019-05-11 NOTE — Progress Notes (Signed)
Kidney function is worse - could be from fluid pills Hgb is down let's double check he is taking his iron  He needs to: 1) reduce furosemide by 20 mg and take 80 mg qd instead of 100 mg daily 2) start midodrine 2.5 mg tid (I sent Rx) - this will raise BP and help kidneys work better 3) make sure he is taking his iron supplement 4) BMET and CBC again and ferritin 11/1 (I ordered)

## 2019-05-12 ENCOUNTER — Telehealth: Payer: Self-pay | Admitting: Internal Medicine

## 2019-05-12 ENCOUNTER — Other Ambulatory Visit: Payer: Self-pay | Admitting: Internal Medicine

## 2019-05-12 NOTE — Telephone Encounter (Signed)
I have spoke to him and informed him of the recent lab results and he will come next week to get lab work. Confirmed with him and he said he is still taking his iron.

## 2019-05-18 ENCOUNTER — Ambulatory Visit (HOSPITAL_COMMUNITY): Payer: 59 | Attending: Internal Medicine

## 2019-05-18 ENCOUNTER — Other Ambulatory Visit: Payer: Self-pay

## 2019-05-18 DIAGNOSIS — R188 Other ascites: Secondary | ICD-10-CM | POA: Diagnosis not present

## 2019-05-19 ENCOUNTER — Telehealth: Payer: Self-pay

## 2019-05-19 NOTE — Telephone Encounter (Signed)
Pt left v/m that he dropped off biometric form about 1 month ago and also needs cholesterol readings. I did not see biometrics scanned under media tab.Please advise.

## 2019-05-20 ENCOUNTER — Other Ambulatory Visit: Payer: Self-pay | Admitting: Internal Medicine

## 2019-05-20 DIAGNOSIS — K7031 Alcoholic cirrhosis of liver with ascites: Secondary | ICD-10-CM

## 2019-05-20 NOTE — Progress Notes (Signed)
Billy [name],  This test shows heart doing its job, somemild abnormalities but the important thing is this result means it is ok to do the TIPS procedure.  I appreciate the opportunity to care for you. Gatha Mayer, MD, [facility]

## 2019-05-20 NOTE — Telephone Encounter (Signed)
Left message on VM per DPR that the form was faxed last month. I have not sent him the copy yet.

## 2019-05-21 ENCOUNTER — Other Ambulatory Visit (INDEPENDENT_AMBULATORY_CARE_PROVIDER_SITE_OTHER): Payer: 59

## 2019-05-21 ENCOUNTER — Other Ambulatory Visit: Payer: Self-pay | Admitting: Internal Medicine

## 2019-05-21 DIAGNOSIS — K7031 Alcoholic cirrhosis of liver with ascites: Secondary | ICD-10-CM

## 2019-05-21 DIAGNOSIS — D500 Iron deficiency anemia secondary to blood loss (chronic): Secondary | ICD-10-CM

## 2019-05-21 LAB — BASIC METABOLIC PANEL
BUN: 36 mg/dL — ABNORMAL HIGH (ref 6–23)
CO2: 26 mEq/L (ref 19–32)
Calcium: 8.7 mg/dL (ref 8.4–10.5)
Chloride: 100 mEq/L (ref 96–112)
Creatinine, Ser: 1.79 mg/dL — ABNORMAL HIGH (ref 0.40–1.50)
GFR: 37.6 mL/min — ABNORMAL LOW (ref >60.00)
Glucose, Bld: 113 mg/dL — ABNORMAL HIGH (ref 70–99)
Potassium: 4.3 mEq/L (ref 3.5–5.1)
Sodium: 134 mEq/L — ABNORMAL LOW (ref 135–145)

## 2019-05-21 LAB — FERRITIN: Ferritin: 22.4 ng/mL (ref 22.0–322.0)

## 2019-05-21 LAB — CBC WITH DIFFERENTIAL/PLATELET
Basophils Absolute: 0.1 10*3/uL (ref 0.0–0.1)
Basophils Relative: 0.5 % (ref 0.0–3.0)
Eosinophils Absolute: 0.1 10*3/uL (ref 0.0–0.7)
Eosinophils Relative: 0.7 % (ref 0.0–5.0)
HCT: 24.3 % — ABNORMAL LOW (ref 39.0–52.0)
Hemoglobin: 7.9 g/dL — CL (ref 13.0–17.0)
Lymphocytes Relative: 5.3 % — ABNORMAL LOW (ref 12.0–46.0)
Lymphs Abs: 0.6 10*3/uL — ABNORMAL LOW (ref 0.7–4.0)
MCHC: 32.4 g/dL (ref 30.0–36.0)
MCV: 96.9 fl (ref 78.0–100.0)
Monocytes Absolute: 1.2 10*3/uL — ABNORMAL HIGH (ref 0.1–1.0)
Monocytes Relative: 10.9 % (ref 3.0–12.0)
Neutro Abs: 8.8 10*3/uL — ABNORMAL HIGH (ref 1.4–7.7)
Neutrophils Relative %: 82.6 % — ABNORMAL HIGH (ref 43.0–77.0)
Platelets: 187 10*3/uL (ref 150.0–400.0)
RBC: 2.5 Mil/uL — ABNORMAL LOW (ref 4.22–5.81)
RDW: 14.9 % (ref 11.5–15.5)
WBC: 10.6 10*3/uL — ABNORMAL HIGH (ref 4.0–10.5)

## 2019-05-24 NOTE — Progress Notes (Signed)
Please let him know  1) Hgb - blood count is lower slightly 2) kidney function about the same 3) iron level lower  I recommend he get Injectafer if on formulary 750 mg IV x 2 doses > 7 days apart  Or if not available then Infed per pharmacy protocol  Needs CBC again in 2 weeks regardless and CBC and ferritin 1 mo after the infusion(s)

## 2019-05-25 ENCOUNTER — Telehealth: Payer: Self-pay | Admitting: Internal Medicine

## 2019-05-25 ENCOUNTER — Other Ambulatory Visit: Payer: Self-pay

## 2019-05-25 DIAGNOSIS — D500 Iron deficiency anemia secondary to blood loss (chronic): Secondary | ICD-10-CM

## 2019-05-25 NOTE — Telephone Encounter (Signed)
New orders faxed to [facility] 

## 2019-05-26 ENCOUNTER — Other Ambulatory Visit: Payer: Self-pay

## 2019-05-26 ENCOUNTER — Encounter: Payer: Self-pay | Admitting: *Deleted

## 2019-05-26 ENCOUNTER — Other Ambulatory Visit: Payer: Self-pay | Admitting: Interventional Radiology

## 2019-05-26 ENCOUNTER — Ambulatory Visit
Admission: RE | Admit: 2019-05-26 | Discharge: 2019-05-26 | Disposition: A | Discharge status: Home / Self Care | Payer: 59 | Source: Ambulatory Visit | Attending: Internal Medicine | Admitting: Internal Medicine

## 2019-05-26 ENCOUNTER — Telehealth: Payer: Self-pay | Admitting: Internal Medicine

## 2019-05-26 DIAGNOSIS — K7031 Alcoholic cirrhosis of liver with ascites: Secondary | ICD-10-CM

## 2019-05-26 HISTORY — PX: IR RADIOLOGIST EVAL & MGMT: IMG5224

## 2019-05-26 NOTE — Telephone Encounter (Signed)
An associate with [facility] called stating that they need a new order that indicates infed only as it is the preferred drug of pt's insurance. The plan of treatment needs to be signed by the doctor, too.

## 2019-05-26 NOTE — Consult Note (Signed)
Chief Complaint:  Cirrhosis, ascites, peripheral edema  Referring Physician(s): Gessner,Carl E  History of Present Illness: Billy [name] is a 73 y.o. male with chronic alcoholic cirrhosis and progressive ascites resulting in abdominal distention and scrotal edema.  He also has a prior history of esophageal varices.  Despite diuretics, the abdominal distention and secondary scrotal edema has progressed and becoming uncomfortable.  He is now referred from Dr. Carlean Purl for TIPS evaluation.  He reports some mild fatigue and weakness.  Recently, he had acute swelling and was found to have a lower extremity DVT for which he is on Eliquis.  He is currently on spironolactone, furosemide, and nadolol.  Despite aggressive diuretics the abdominal distention and scrotal edema have progressed resulting in significant abdominal and scrotal discomfort.  Calculated meld score is 18.  Recent echo demonstrated ejection fraction 65%.  Past Medical History:  Diagnosis Date  . Acute deep vein thrombosis (DVT) of femoral vein of right lower extremity ([place]) 03/30/2019  . Acute posthemorrhagic anemia   . Alcoholic cirrhosis of liver ([place]) 09/24/2011   Years of alcohol use. Progressed from fatty liver to cirrhosis based upon review imaging. Manifestations are portal hypertension with esophageal varices, portal gastropathy, rectal varices and he may have portal colopathy. Ascites also present. Naive to HAV and HBV Claims he has had pneumococcal vaccine   . Alcoholism ([place])   . Angiodysplasia of colon 09/11/2011   Multiple in the right colon, ablated with APC. Question if this is portal colopathy.   . Anxiety   . Back pain   . Basal cell carcinoma   . Bilateral varicoceles   . Campylobacter diarrhea 01/07/2012  . Cataract    removed  . DDD (degenerative disc disease), lumbar   . Depression    mild at most  . Diverticulosis of colon   . Erectile dysfunction   . Gastric ulcer 08/2011   small, antral  ulcer  . GERD (gastroesophageal reflux disease)    takes Omeprazole prn  . GI hemorrhage 2006, 2013   esophagitis and 1+ varices, melena in 2013   . Grade I diastolic dysfunction   . Hearing loss   . History of ascites   . History of blood transfusion    as a child and no abnormal reaction noted  . History of bronchitis    >66yrs ago  . History of dizziness   . Hydrocele   . Hypertension    takes Nadolol daily  . Narcotic dependence ([place])   . Personal history of colonic polyps   . Portal hypertension ([place]) 08/2011  . 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 Right hydrocele   . Scrotal edema   . Sleep disturbance    takes Remeron nightly  . Splenomegaly   . Thrombocytopenia, unspecified ([place])   . Varices, esophageal ([place]) 08/2011   Grade 2, mid-esophagus    Past Surgical History:  Procedure Laterality Date  . BIOPSY  08/27/2018   Procedure: BIOPSY;  Surgeon: Milus Banister, MD;  Location: [facility];  Service: Endoscopy;;  . CATARACT EXTRACTION  2013   bilateral with IOL  . CHOLECYSTECTOMY  12/31/2012   Procedure: LAPAROSCOPIC CHOLECYSTECTOMY;  Surgeon: Zenovia Jarred, MD;  Location: [place];  Service: General;;  . COLONOSCOPY  2001, 200411/02/2006  . COLONOSCOPY  09/11/2011   Procedure: COLONOSCOPY;  Surgeon: Gatha Mayer, MD;  Location: [facility];  Service: Endoscopy;  Laterality: N/A;  . COLONOSCOPY    . ESOPHAGEAL BANDING N/A  05/02/2017   Procedure: ESOPHAGEAL BANDING;  Surgeon: Gatha Mayer, MD;  Location: [facility];  Service: Endoscopy;  Laterality: N/A;  . ESOPHAGEAL BANDING  06/09/2018   Procedure: ESOPHAGEAL BANDING;  Surgeon: Gatha Mayer, MD;  Location: [facility];  Service: Endoscopy;;  . ESOPHAGEAL BANDING  08/27/2018   Procedure: ESOPHAGEAL BANDING;  Surgeon: Milus Banister, MD;  Location: [facility];  Service: Endoscopy;;  . ESOPHAGOGASTRODUODENOSCOPY  2006  . ESOPHAGOGASTRODUODENOSCOPY  09/11/2011   Procedure: ESOPHAGOGASTRODUODENOSCOPY (EGD);   Surgeon: Gatha Mayer, MD;  Location: [facility];  Service: Endoscopy;  Laterality: N/A;  . ESOPHAGOGASTRODUODENOSCOPY N/A 08/27/2018   Procedure: ESOPHAGOGASTRODUODENOSCOPY (EGD);  Surgeon: Milus Banister, MD;  Location: [facility];  Service: Endoscopy;  Laterality: N/A;  . ESOPHAGOGASTRODUODENOSCOPY (EGD) WITH PROPOFOL N/A 04/13/2017   Procedure: ESOPHAGOGASTRODUODENOSCOPY (EGD) WITH PROPOFOL;  Surgeon: Doran Stabler, MD;  Location: [facility];  Service: Gastroenterology;  Laterality: N/A;  . ESOPHAGOGASTRODUODENOSCOPY (EGD) WITH PROPOFOL N/A 05/02/2017   Procedure: ESOPHAGOGASTRODUODENOSCOPY (EGD) WITH PROPOFOL;  Surgeon: Gatha Mayer, MD;  Location: [facility];  Service: Endoscopy;  Laterality: N/A;  . ESOPHAGOGASTRODUODENOSCOPY (EGD) WITH PROPOFOL N/A 09/30/2017   Procedure: ESOPHAGOGASTRODUODENOSCOPY (EGD) WITH PROPOFOL;  Surgeon: Gatha Mayer, MD;  [facility]: [facility];  Service: Endoscopy;  Laterality: N/A;  . ESOPHAGOGASTRODUODENOSCOPY (EGD) WITH PROPOFOL N/A 06/09/2018   Procedure: ESOPHAGOGASTRODUODENOSCOPY (EGD) WITH PROPOFOL;  Surgeon: Gatha Mayer, MD;  [facility]: [facility];  Service: Endoscopy;  Laterality: N/A;  . ESOPHAGOGASTRODUODENOSCOPY (EGD) WITH PROPOFOL N/A 01/05/2019   Procedure: ESOPHAGOGASTRODUODENOSCOPY (EGD) WITH PROPOFOL;  Surgeon: Gatha Mayer, MD;  Location: [facility];  Service: Endoscopy;  Laterality: N/A;  . EUS N/A 08/27/2018   Procedure: UPPER ENDOSCOPIC ULTRASOUND (EUS) RADIAL;  Surgeon: Milus Banister, MD;  Location: [facility];  Service: Endoscopy;  Laterality: N/A;  . EYE SURGERY    . FLEXIBLE SIGMOIDOSCOPY  01/07/2012   Procedure: FLEXIBLE SIGMOIDOSCOPY;  Surgeon: Lafayette Dragon, MD;  Location: [facility];  Service: Endoscopy;  Laterality: N/A;  . KNEE ARTHROSCOPY  2008   Right  . KNEE ARTHROSCOPY  2008   Left  . POLYPECTOMY    . [place] THORA/PARACENTESIS  10/28/2011  . Varicocele repair  1980's    Allergies: Yellow  jacket venom and Nsaids  Medications: Prior to Admission medications   Medication Sig Start Date End Date Taking? Authorizing Provider  apixaban (ELIQUIS) 5 MG TABS tablet Take 1 tablet (5 mg total) by mouth 2 (two) times daily. 03/31/19   Barton Dubois, MD  buPROPion (WELLBUTRIN SR) 150 MG 12 hr tablet TAKE 1 TABLET BY MOUTH EVERY DAY Patient taking differently: Take 150 mg by mouth daily.  06/15/18   [name], MD  Eliquis DVT/PE Starter Pack (ELIQUIS STARTER PACK) 5 MG TABS Take as directed on package: start with two-5mg  tablets twice daily for 7 days. On day 8, switch to one-5mg  tablet twice daily. Patient taking differently: Take 5 mg by mouth daily. T 03/31/19   Barton Dubois, MD  ferrous sulfate 325 (65 FE) MG EC tablet Take 325 mg by mouth every other day.    [provider]  fluocinonide cream (LIDEX) [id number] % Apply 1 application topically 2 (two) times daily as needed (for skin rash/irritation). APPLY ON THE SKIN TWICE A DAY AS NEEDED FOR RASH 12/30/18   [name], MD  furosemide (LASIX) 40 MG tablet Take 2 tablets (80 mg total) by mouth daily. 01/05/19   Gatha Mayer, MD  HYDROcodone-acetaminophen (  NORCO/VICODIN) 5-325 MG tablet Take 1 tablet by mouth 3 (three) times daily as needed (for pain.). 04/30/19   Jearld Fenton, NP  loperamide (IMODIUM A-D) 2 MG tablet Take 2 mg by mouth as needed for diarrhea or loose stools.    [provider]  midodrine (PROAMATINE) 2.5 MG tablet Take 1 tablet (2.5 mg total) by mouth 3 (three) times daily with meals. 05/11/19   Gatha Mayer, MD  mirtazapine (REMERON) 45 MG tablet TAKE 1 TABLET (45 MG TOTAL) BY MOUTH AT BEDTIME. 05/12/19   [name], MD  Multiple Vitamin (MULTIVITAMIN WITH MINERALS) TABS Take 1 tablet by mouth daily.     [provider]  nadolol (CORGARD) 40 MG tablet Take 20 mg by mouth daily.    [provider]  omeprazole (PRILOSEC) 40 MG capsule TAKE 1 CAPSULE BY MOUTH TWICE A  DAY Patient taking differently: Take 40 mg by mouth 2 (two) times daily.  09/25/18   [name], MD  PREVIDENT 5000 BOOSTER PLUS 1.1 % PSTE Place 1 application onto teeth 2 (two) times daily.  03/30/18   [provider]  psyllium (METAMUCIL) 58.6 % powder Take 1 packet by mouth at bedtime.     [provider]  spironolactone (ALDACTONE) 50 MG tablet Take 4 tablets (200 mg total) by mouth daily. 11/16/18   Gatha Mayer, MD  vitamin C (ASCORBIC ACID) 250 MG tablet Take 250 mg by mouth daily.     [provider]     Family History  Problem Relation Age of Onset  . Stroke Mother   . Diabetes Mother   . Lung cancer Brother   . Prostate cancer Brother   . Liver cancer Cousin   . Liver cancer Paternal Uncle   . Colon cancer Neg Hx   . Stomach cancer Neg Hx   . Esophageal cancer Neg Hx   . Colon polyps Neg Hx   . Rectal cancer Neg Hx   . Pancreatic cancer Neg Hx     Social History   Socioeconomic History  . Marital status: Married    Spouse name: Not on file  . Number of children: 1  . Years of education: Not on file  . Highest education level: Not on file  Occupational History  . Occupation: Retired    Comment: mostly from Research officer, political party Needs  . Financial resource strain: Not on file  . Food insecurity    Worry: Not on file    Inability: Not on file  . Transportation needs    Medical: Not on file    Non-medical: Not on file  Tobacco Use  . Smoking status: Light Tobacco Smoker    Packs/day: 0.50    Types: Cigarettes, E-cigarettes  . Smokeless tobacco: Never Used  Substance and Sexual Activity  . Alcohol use: Not Currently    Alcohol/week: 0.0 standard drinks  . Drug use: Yes    Types: Marijuana    Comment: occasionally, last time couple of weeks ago  . Sexual activity: Never  Lifestyle  . Physical activity    Days per week: Not on file    Minutes per session: Not on file  . Stress: Not on file  Relationships  . Social Product manager on phone: Not on file    Gets together: Not on file    Attends religious service: Not on file    Active member of club or organization: Not on file  Attends meetings of clubs or organizations: Not on file    Relationship status: Not on file  Other Topics Concern  . Not on file  Social History Narrative   Married- 2nd   1 son (adopted) from previous marriage      Has living will   Wife is  health care POA---alternate is son   Would accept attempts at resuscitation   No tube feeds if cognitively unaware        Review of Systems  Review of Systems: A 12 point ROS discussed and pertinent positives are indicated in the HPI above.  All other systems are negative.  Physical Exam No direct physical exam was performed, telephone visit only today because of Covid pandemic.   Vital Signs: There were no vitals taken for this visit.  Imaging: No results found.  Labs:  CBC: Recent Labs    03/31/19 0441 04/02/19 1108 05/10/19 1136 05/21/19 1512  WBC 6.3 6.0 7.2 10.6*  HGB 11.7* 11.3* 8.1 Repeated and verified X2.* 7.9 Repeated and verified X2.*  HCT 36.7* 33.7* 24.9* 24.3*  PLT 105* 114.0* 177.0 187.0    COAGS: Recent Labs    02/01/19 1239 03/30/19 1050 03/31/19 0441  INR 1.2* 1.3* 1.4*    BMP: Recent Labs    03/30/19 1050 03/31/19 0441 04/02/19 1108 05/10/19 1136 05/21/19 1512  NA 135 134* 136 134* 134*  K 4.5 4.9 4.8 4.3 4.3  CL 98 101 101 100 100  CO2 27 23 29 28 26   GLUCOSE 109* 106* 112* 127* 113*  BUN 34* 25* 26* 31* 36*  CALCIUM 9.0 8.6* 8.9 8.5 8.7  CREATININE 1.77* 1.25* 1.30 1.77* 1.79*  GFRNONAA 38* 58*  --   --   --   GFRAA 44* >60  --   --   --     LIVER FUNCTION TESTS: Recent Labs    01/01/19 1231 03/30/19 1050 03/31/19 0441 04/02/19 1108 05/10/19 1136  BILITOT 0.8 1.7* 1.3*  --  0.8  AST 31 36 30  --  22  ALT 14 16 15   --  12  ALKPHOS 79 71 63  --  59  PROT 6.5 7.6 6.8  --  6.2  ALBUMIN 3.1*  3.1* 3.4* 2.9* 3.1*  2.9*     Assessment and Plan:  Progressive alcoholic cirrhosis with developing ascites and marked scrotal edema resulting in abdominal and scrotal discomfort despite aggressive diuretic therapy.  The TIPS procedure was reviewed in detail including the need for general anesthesia.  The procedure, risk, benefits and alternatives were all reviewed.  Expected goals, recovery and outcomes reviewed.  He understands he would likely require lactulose or Xifaxan to treat hepatic encephalopathy following the procedure.  Recent echo demonstrated an ejection fraction of 65%.  Calculated meld score with sodium is 18.  Plan: Scheduled for CT BRTO/TIPS protocol with reduced IV contrast because of a mildly elevated creatinine.  This will assess his liver anatomy for the TIPS procedure.  Thank you for this interesting consult.  I greatly enjoyed meeting Billy [name] and look forward to participating in their care.  A copy of this report was sent to the requesting provider on this date.  Electronically Signed: Greggory Keen 05/26/2019, 1:35 PM   I spent a total of  40 Minutes   in remote  clinical consultation, greater than 50% of which was counseling/coordinating care for with cirrhosis, abdominal distention and secondary scrotal edema.    Visit type: Audio only (telephone). Audio (no  video) only due to patient's lack of internet/smartphone capability. Alternative for in-person consultation at [facility], [address], [place], [place]. This visit type was conducted due to national recommendations for restrictions regarding the COVID-19 Pandemic (e.g. social distancing).  This format is felt to be most appropriate for this patient at this time.  All issues noted in this document were discussed and addressed.

## 2019-05-26 NOTE — Telephone Encounter (Signed)
Order faxed again to [facility]:[id number]

## 2019-05-27 ENCOUNTER — Other Ambulatory Visit: Payer: Self-pay | Admitting: Internal Medicine

## 2019-05-27 DIAGNOSIS — K7031 Alcoholic cirrhosis of liver with ascites: Secondary | ICD-10-CM

## 2019-05-27 MED ORDER — HYDROCODONE-ACETAMINOPHEN 5-325 MG PO TABS: 1.0000 | ORAL_TABLET | Freq: Three times a day (TID) | ORAL | 0 refills | Status: DC | PRN

## 2019-05-27 NOTE — Telephone Encounter (Signed)
Last office visit 04/02/2019 for Hospital Follow up & 3 month Narcotic follow up.  Last refilled 04/30/2019 for #90 with no refills.  UDS/Contract 09/22/2018.  Next Appt: 07/02/2019 for 3 month follow up.

## 2019-05-27 NOTE — Telephone Encounter (Signed)
Fax [phone]

## 2019-05-27 NOTE — Telephone Encounter (Signed)
I spoke with Lelon Frohlich at [facility].  She is advised that the confirmation for orders says it went through on 11/10 and 11/11.  She asked that I try an alternate fax of [phone].  She will call me back directly if she does not receive the orders.

## 2019-05-27 NOTE — Telephone Encounter (Signed)
[facility] called again and states they never received the order.

## 2019-05-28 ENCOUNTER — Emergency Department (HOSPITAL_COMMUNITY): Payer: 59

## 2019-05-28 ENCOUNTER — Other Ambulatory Visit: Payer: Self-pay

## 2019-05-28 ENCOUNTER — Encounter (HOSPITAL_COMMUNITY): Payer: Self-pay

## 2019-05-28 ENCOUNTER — Telehealth: Payer: Self-pay | Admitting: Internal Medicine

## 2019-05-28 ENCOUNTER — Inpatient Hospital Stay (HOSPITAL_COMMUNITY)
Admission: EM | Admit: 2019-05-28 | Discharge: 2019-05-31 | DRG: 368 | Disposition: A | Discharge status: Home / Self Care | Payer: 59 | Attending: Internal Medicine | Admitting: Internal Medicine

## 2019-05-28 DIAGNOSIS — Z85828 Personal history of other malignant neoplasm of skin: Secondary | ICD-10-CM

## 2019-05-28 DIAGNOSIS — K2101 Gastro-esophageal reflux disease with esophagitis, with bleeding: Principal | ICD-10-CM | POA: Diagnosis present

## 2019-05-28 DIAGNOSIS — F1020 Alcohol dependence, uncomplicated: Secondary | ICD-10-CM | POA: Diagnosis present

## 2019-05-28 DIAGNOSIS — Z9049 Acquired absence of other specified parts of digestive tract: Secondary | ICD-10-CM

## 2019-05-28 DIAGNOSIS — Z86718 Personal history of other venous thrombosis and embolism: Secondary | ICD-10-CM

## 2019-05-28 DIAGNOSIS — D62 Acute posthemorrhagic anemia: Secondary | ICD-10-CM | POA: Diagnosis present

## 2019-05-28 DIAGNOSIS — K766 Portal hypertension: Secondary | ICD-10-CM | POA: Diagnosis present

## 2019-05-28 DIAGNOSIS — Z20828 Contact with and (suspected) exposure to other viral communicable diseases: Secondary | ICD-10-CM | POA: Diagnosis present

## 2019-05-28 DIAGNOSIS — D509 Iron deficiency anemia, unspecified: Secondary | ICD-10-CM | POA: Diagnosis present

## 2019-05-28 DIAGNOSIS — F329 Major depressive disorder, single episode, unspecified: Secondary | ICD-10-CM | POA: Diagnosis present

## 2019-05-28 DIAGNOSIS — E860 Dehydration: Secondary | ICD-10-CM | POA: Diagnosis present

## 2019-05-28 DIAGNOSIS — R4182 Altered mental status, unspecified: Secondary | ICD-10-CM | POA: Diagnosis present

## 2019-05-28 DIAGNOSIS — N179 Acute kidney failure, unspecified: Secondary | ICD-10-CM | POA: Diagnosis not present

## 2019-05-28 DIAGNOSIS — Z79899 Other long term (current) drug therapy: Secondary | ICD-10-CM

## 2019-05-28 DIAGNOSIS — K922 Gastrointestinal hemorrhage, unspecified: Secondary | ICD-10-CM | POA: Diagnosis present

## 2019-05-28 DIAGNOSIS — K3189 Other diseases of stomach and duodenum: Secondary | ICD-10-CM

## 2019-05-28 DIAGNOSIS — K921 Melena: Secondary | ICD-10-CM | POA: Diagnosis present

## 2019-05-28 DIAGNOSIS — K7031 Alcoholic cirrhosis of liver with ascites: Secondary | ICD-10-CM | POA: Diagnosis present

## 2019-05-28 DIAGNOSIS — G8929 Other chronic pain: Secondary | ICD-10-CM | POA: Diagnosis present

## 2019-05-28 DIAGNOSIS — K31819 Angiodysplasia of stomach and duodenum without bleeding: Secondary | ICD-10-CM | POA: Diagnosis present

## 2019-05-28 DIAGNOSIS — F1290 Cannabis use, unspecified, uncomplicated: Secondary | ICD-10-CM | POA: Diagnosis present

## 2019-05-28 DIAGNOSIS — F0390 Unspecified dementia without behavioral disturbance: Secondary | ICD-10-CM | POA: Diagnosis present

## 2019-05-28 DIAGNOSIS — G9341 Metabolic encephalopathy: Secondary | ICD-10-CM | POA: Diagnosis present

## 2019-05-28 DIAGNOSIS — Z886 Allergy status to analgesic agent status: Secondary | ICD-10-CM

## 2019-05-28 DIAGNOSIS — Z91030 Bee allergy status: Secondary | ICD-10-CM

## 2019-05-28 DIAGNOSIS — F1120 Opioid dependence, uncomplicated: Secondary | ICD-10-CM | POA: Diagnosis present

## 2019-05-28 DIAGNOSIS — I82401 Acute embolism and thrombosis of unspecified deep veins of right lower extremity: Secondary | ICD-10-CM

## 2019-05-28 DIAGNOSIS — F17210 Nicotine dependence, cigarettes, uncomplicated: Secondary | ICD-10-CM | POA: Diagnosis present

## 2019-05-28 DIAGNOSIS — R410 Disorientation, unspecified: Secondary | ICD-10-CM

## 2019-05-28 DIAGNOSIS — I129 Hypertensive chronic kidney disease with stage 1 through stage 4 chronic kidney disease, or unspecified chronic kidney disease: Secondary | ICD-10-CM | POA: Diagnosis present

## 2019-05-28 DIAGNOSIS — F419 Anxiety disorder, unspecified: Secondary | ICD-10-CM | POA: Diagnosis present

## 2019-05-28 DIAGNOSIS — H9190 Unspecified hearing loss, unspecified ear: Secondary | ICD-10-CM | POA: Diagnosis present

## 2019-05-28 DIAGNOSIS — Z7901 Long term (current) use of anticoagulants: Secondary | ICD-10-CM

## 2019-05-28 DIAGNOSIS — R161 Splenomegaly, not elsewhere classified: Secondary | ICD-10-CM | POA: Diagnosis present

## 2019-05-28 DIAGNOSIS — Z6820 Body mass index (BMI) 20.0-20.9, adult: Secondary | ICD-10-CM

## 2019-05-28 DIAGNOSIS — N1832 Chronic kidney disease, stage 3b: Secondary | ICD-10-CM | POA: Diagnosis present

## 2019-05-28 DIAGNOSIS — Z8719 Personal history of other diseases of the digestive system: Secondary | ICD-10-CM

## 2019-05-28 DIAGNOSIS — R64 Cachexia: Secondary | ICD-10-CM | POA: Diagnosis present

## 2019-05-28 DIAGNOSIS — T45515A Adverse effect of anticoagulants, initial encounter: Secondary | ICD-10-CM | POA: Diagnosis present

## 2019-05-28 DIAGNOSIS — K7290 Hepatic failure, unspecified without coma: Secondary | ICD-10-CM | POA: Diagnosis present

## 2019-05-28 DIAGNOSIS — I8500 Esophageal varices without bleeding: Secondary | ICD-10-CM | POA: Diagnosis present

## 2019-05-28 DIAGNOSIS — G479 Sleep disorder, unspecified: Secondary | ICD-10-CM | POA: Diagnosis present

## 2019-05-28 LAB — CBC WITH DIFFERENTIAL/PLATELET
Abs Immature Granulocytes: 0.07 10*3/uL (ref 0.00–0.07)
Basophils Absolute: 0 10*3/uL (ref 0.0–0.1)
Basophils Relative: 0 %
Eosinophils Absolute: 0 10*3/uL (ref 0.0–0.5)
Eosinophils Relative: 0 %
HCT: 25.1 % — ABNORMAL LOW (ref 39.0–52.0)
Hemoglobin: 7.6 g/dL — ABNORMAL LOW (ref 13.0–17.0)
Immature Granulocytes: 1 %
Lymphocytes Relative: 7 %
Lymphs Abs: 0.8 10*3/uL (ref 0.7–4.0)
MCH: 29.9 pg (ref 26.0–34.0)
MCHC: 30.3 g/dL (ref 30.0–36.0)
MCV: 98.8 fL (ref 80.0–100.0)
Monocytes Absolute: 1.1 10*3/uL — ABNORMAL HIGH (ref 0.1–1.0)
Monocytes Relative: 10 %
Neutro Abs: 9.1 10*3/uL — ABNORMAL HIGH (ref 1.7–7.7)
Neutrophils Relative %: 82 %
Platelets: 231 10*3/uL (ref 150–400)
RBC: 2.54 MIL/uL — ABNORMAL LOW (ref 4.22–5.81)
RDW: 14.6 % (ref 11.5–15.5)
WBC: 11.1 10*3/uL — ABNORMAL HIGH (ref 4.0–10.5)
nRBC: 0 % (ref 0.0–0.2)

## 2019-05-28 LAB — COMPREHENSIVE METABOLIC PANEL
ALT: 20 U/L (ref 0–44)
AST: 27 U/L (ref 15–41)
Albumin: 2.7 g/dL — ABNORMAL LOW (ref 3.5–5.0)
Alkaline Phosphatase: 73 U/L (ref 38–126)
Anion gap: 11 (ref 5–15)
BUN: 46 mg/dL — ABNORMAL HIGH (ref 8–23)
CO2: 24 mmol/L (ref 22–32)
Calcium: 8.4 mg/dL — ABNORMAL LOW (ref 8.9–10.3)
Chloride: 98 mmol/L (ref 98–111)
Creatinine, Ser: 2.51 mg/dL — ABNORMAL HIGH (ref 0.61–1.24)
GFR calc Af Amer: 29 mL/min — ABNORMAL LOW (ref >60)
GFR calc non Af Amer: 25 mL/min — ABNORMAL LOW (ref >60)
Glucose, Bld: 106 mg/dL — ABNORMAL HIGH (ref 70–99)
Potassium: 4.7 mmol/L (ref 3.5–5.1)
Sodium: 133 mmol/L — ABNORMAL LOW (ref 135–145)
Total Bilirubin: 1.1 mg/dL (ref 0.3–1.2)
Total Protein: 6.4 g/dL — ABNORMAL LOW (ref 6.5–8.1)

## 2019-05-28 LAB — POC OCCULT BLOOD, ED: Fecal Occult Bld: POSITIVE — AB

## 2019-05-28 LAB — AMMONIA: Ammonia: 33 umol/L (ref 9–35)

## 2019-05-28 LAB — SARS CORONAVIRUS 2 (TAT 6-24 HRS): SARS Coronavirus 2: NEGATIVE

## 2019-05-28 MED ORDER — SENNA 8.6 MG PO TABS
1.0000 | ORAL_TABLET | Freq: Two times a day (BID) | ORAL | Status: DC
  Administered 2019-05-28 – 2019-05-31 (×4): 8.6 mg via ORAL
  Filled 2019-05-28 (×5): qty 1

## 2019-05-28 MED ORDER — SODIUM CHLORIDE 0.9 % IV SOLN: INTRAVENOUS | Status: DC

## 2019-05-28 MED ORDER — MIDODRINE HCL 2.5 MG PO TABS
2.5000 mg | ORAL_TABLET | Freq: Three times a day (TID) | ORAL | Status: DC
  Administered 2019-05-29 – 2019-05-31 (×4): 2.5 mg via ORAL
  Filled 2019-05-28 (×9): qty 1

## 2019-05-28 MED ORDER — SODIUM CHLORIDE 0.9 % IV SOLN
INTRAVENOUS | Status: DC
  Administered 2019-05-28 – 2019-05-31 (×5): via INTRAVENOUS

## 2019-05-28 MED ORDER — HYDROCODONE-ACETAMINOPHEN 5-325 MG PO TABS
1.0000 | ORAL_TABLET | Freq: Three times a day (TID) | ORAL | Status: DC | PRN
  Administered 2019-05-28 – 2019-05-31 (×9): 1 via ORAL
  Filled 2019-05-28 (×10): qty 1

## 2019-05-28 MED ORDER — VITAMIN C 500 MG PO TABS
250.0000 mg | ORAL_TABLET | Freq: Every day | ORAL | Status: DC
  Administered 2019-05-31: 250 mg via ORAL
  Filled 2019-05-28 (×3): qty 1

## 2019-05-28 MED ORDER — PANTOPRAZOLE SODIUM 40 MG PO TBEC: 40.0000 mg | DELAYED_RELEASE_TABLET | Freq: Two times a day (BID) | ORAL | Status: DC

## 2019-05-28 MED ORDER — PANTOPRAZOLE SODIUM 40 MG IV SOLR
40.0000 mg | Freq: Two times a day (BID) | INTRAVENOUS | Status: DC
  Administered 2019-05-28 – 2019-05-31 (×6): 40 mg via INTRAVENOUS
  Filled 2019-05-28 (×6): qty 40

## 2019-05-28 MED ORDER — ACETAMINOPHEN 650 MG RE SUPP: 650.0000 mg | Freq: Four times a day (QID) | RECTAL | Status: DC | PRN

## 2019-05-28 MED ORDER — LIP MEDEX EX OINT
1.0000 "application " | TOPICAL_OINTMENT | CUTANEOUS | Status: DC | PRN
  Administered 2019-05-28 – 2019-05-29 (×2): 1 via TOPICAL
  Filled 2019-05-28: qty 7

## 2019-05-28 MED ORDER — ACETAMINOPHEN 325 MG PO TABS: 650.0000 mg | ORAL_TABLET | Freq: Four times a day (QID) | ORAL | Status: DC | PRN

## 2019-05-28 MED ORDER — FERROUS SULFATE 325 (65 FE) MG PO TABS
325.0000 mg | ORAL_TABLET | ORAL | Status: DC
  Administered 2019-05-31: 325 mg via ORAL
  Filled 2019-05-28 (×3): qty 1

## 2019-05-28 MED ORDER — PSYLLIUM 95 % PO PACK
1.0000 | PACK | Freq: Every day | ORAL | Status: DC
  Administered 2019-05-28 – 2019-05-30 (×3): 1 via ORAL
  Filled 2019-05-28 (×5): qty 1

## 2019-05-28 MED ORDER — ADULT MULTIVITAMIN W/MINERALS CH
1.0000 | ORAL_TABLET | Freq: Every day | ORAL | Status: DC
  Administered 2019-05-28 – 2019-05-31 (×2): 1 via ORAL
  Filled 2019-05-28 (×3): qty 1

## 2019-05-28 MED ORDER — SODIUM CHLORIDE 0.9 % IV BOLUS
1000.0000 mL | Freq: Once | INTRAVENOUS | Status: AC
  Administered 2019-05-28: 15:00:00 1000 mL via INTRAVENOUS

## 2019-05-28 NOTE — Telephone Encounter (Signed)
Pt's wife Billy [name] called to inform that pt's condition is advancing quick, he is at a point that is not coherent anymore. She does not know how to handle this situation and would like some advise.

## 2019-05-28 NOTE — H&P (Signed)
[facility] History and Physical  [name] [id number] DOB: 06-20-1946 DOA: 05/28/2019 Referring physician: ED PCP: [name], MD  Chief Complaint: altered mental status Came from: home ------------------------------------------------------------------------------------------------------ Assessment/Plan: Active Problems:   AMS (altered mental status)  Acute metabolic encephalopathy -Multifactorial: Progressively worsening anemia, cirrhosis of liver, dehydration, use of pain medicines, probably underlying dementia -Progressively worsening mental status change for weeks particularly worse in last 2 days -I do not see any evidence of infection, ammonia level is normal at 33. -Dehydration probably is the likely cause.  Acute kidney injury on chronic kidney disease stage IIIb -Baseline creatinine 1.5-1.8 -Creatinine 2.51 today.  -Home meds include Lasix, Aldactone which will keep on hold for now. -We will start on gentle hydration with normal saline at 50 ml/hr for next 24 hours only. -Reassess the need after blood work in the morning.  Acute on chronic GI bleeding Acute on chronic anemia -Has history of variceal bleeding requiring EGDs in the past. -Hemoglobin was more than 11 since September 2020.  Since then, hemoglobin seems to have been declining steadily, 7.6 today. -Wife reports black stool this morning.  Ordered for FOBT. -We will start the patient on clear liquid diet for now.  Keep n.p.o. after midnight. -I will start the patient on Protonix IV 40 mg twice daily at this time -Repeat hemoglobin in the morning.  Transfuse if less than 7. -Call GI consult if needed. -Recent history of DVT in September and is currently on Eliquis.  Will hold Eliquis for now.  Patient and family understands that he is at risk of clot propagation if asked to stay off anticoagulation for longer period.  Chronic alcoholism Alcoholic liver cirrhosis Portal  hypertension/ascites -Has not completely stopped drinking.  Wife does not think it severe enough to put him at risk of withdrawal -Watch for withdrawal symptoms however. -Home meds include Lasix, Aldactone, nadolol as well as midodrine. -I will continue midodrine and keep others on hold.  Monitor blood pressure. -Follows up with Dr. Arelia Longest as an outpatient.  Recent right femoral vein DVT -On Eliquis.  We will keep it on hold for now.  Mobility: PT eval ordered Diet: Clear liquid diet for now.  N.p.o. after midnight DVT prophylaxis:  SCDs Code Status:  Full code.  Patient wants resuscitation and intubation 'if it is for brief period only.'  Wife agrees with that assessment. Family Communication:  Discussed with wife at bedside Disposition Plan:  Anticipate more than 2 midnight stay  ----------------------------------------------------------------------------------------------------- History of Present Illness: Billy [name] is a 73 y.o. male with long history of alcohol use progressing to liver cirrhosis and its sequelae including portal hypertension, ascites, variceal bleeding being considered for TIPS.  Also with history of HTN, GERD, basal cell carcinoma, depression.  Patient was recently diagnosed of acute DVT of femoral vein on the right side in September 2020.  Patient was brought to the ED from home today for progressively worsening altered mental status which has been going on for past few weeks and acutely worsened last 2 days.  Patient was accompanied by his wife at bedside.  Patient is alert, awake and oriented x3 but seems forgetful and has inconsistencies.  Wife noted that his cognition seems to have been getting worse over time.  Wife has noticed progressive physical and mental decline over last few weeks.  For first few hours in the morning, patient is very confused and it improves as the day goes.  His gait has been slow and impaired.  Wife states patient continues to smoke  half pack of cigarettes a day, also has not quit drinking completely.  He takes a scheduled Norco 3 times a day for chronic back pain. No fever, no burning urination, no cough, shortness of breath, chest pain. Patient has 1-2 bowel movements a day.  This morning, wife noticed dark stool and is concerned about the bleeding.  In the ED, patient was afebrile, heart rate in 60s, blood pressure 121/97, breathing comfortably on room air. Work-up showed sodium level 133, potassium 4.7, BUN/creatinine 46/2.51 compared to 36/1.79 last week, albumin 2.7, ammonia 33, WBC 11.1, hemoglobin 7.6, platelet 231. CT scan of head did not show any acute intracranial findings and only minimal chronic intravascular findings. Chest x-ray normal.  Review of Systems:  All systems were reviewed and were negative unless otherwise mentioned in the HPI   Past medical history: Past Medical History:  Diagnosis Date  . Acute deep vein thrombosis (DVT) of femoral vein of right lower extremity ([place]) 03/30/2019  . Acute posthemorrhagic anemia   . Alcoholic cirrhosis of liver ([place]) 09/24/2011   Years of alcohol use. Progressed from fatty liver to cirrhosis based upon review imaging. Manifestations are portal hypertension with esophageal varices, portal gastropathy, rectal varices and he may have portal colopathy. Ascites also present. Naive to HAV and HBV Claims he has had pneumococcal vaccine   . Alcoholism ([place])   . Angiodysplasia of colon 09/11/2011   Multiple in the right colon, ablated with APC. Question if this is portal colopathy.   . Anxiety   . Back pain   . Basal cell carcinoma   . Bilateral varicoceles   . Campylobacter diarrhea 01/07/2012  . Cataract    removed  . DDD (degenerative disc disease), lumbar   . Depression    mild at most  . Diverticulosis of colon   . Erectile dysfunction   . Gastric ulcer 08/2011   small, antral ulcer  . GERD (gastroesophageal reflux disease)    takes Omeprazole prn  . GI  hemorrhage 2006, 2013   esophagitis and 1+ varices, melena in 2013   . Grade I diastolic dysfunction   . Hearing loss   . History of ascites   . History of blood transfusion    as a child and no abnormal reaction noted  . History of bronchitis    >61yrs ago  . History of dizziness   . Hydrocele   . Hypertension    takes Nadolol daily  . Narcotic dependence ([place])   . Personal history of colonic polyps   . Portal hypertension ([place]) 08/2011  . 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 Right hydrocele   . Scrotal edema   . Sleep disturbance    takes Remeron nightly  . Splenomegaly   . Thrombocytopenia, unspecified ([place])   . 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 BIOPSY  08/27/2018   Procedure: BIOPSY;  Surgeon: Milus Banister, MD;  Location: [facility];  Service: Endoscopy;;  . CATARACT EXTRACTION  2013   bilateral with IOL  . CHOLECYSTECTOMY  12/31/2012   Procedure: LAPAROSCOPIC CHOLECYSTECTOMY;  Surgeon: Zenovia Jarred, MD;  Location: [facility];  Service: General;;  . COLONOSCOPY  2001, 200411/02/2006  . COLONOSCOPY  09/11/2011   Procedure: COLONOSCOPY;  Surgeon: Gatha Mayer, MD;  Location: [facility];  Service: Endoscopy;  Laterality: N/A;  . COLONOSCOPY    . ESOPHAGEAL  BANDING N/A 05/02/2017   Procedure: ESOPHAGEAL BANDING;  Surgeon: Gatha Mayer, MD;  Location: [facility];  Service: Endoscopy;  Laterality: N/A;  . ESOPHAGEAL BANDING  06/09/2018   Procedure: ESOPHAGEAL BANDING;  Surgeon: Gatha Mayer, MD;  Location: [facility];  Service: Endoscopy;;  . ESOPHAGEAL BANDING  08/27/2018   Procedure: ESOPHAGEAL BANDING;  Surgeon: Milus Banister, MD;  Location: [facility];  Service: Endoscopy;;  . ESOPHAGOGASTRODUODENOSCOPY  2006  . ESOPHAGOGASTRODUODENOSCOPY  09/11/2011   Procedure: ESOPHAGOGASTRODUODENOSCOPY (EGD);  Surgeon: Gatha Mayer, MD;  [facility]: [facility];   Service: Endoscopy;  Laterality: N/A;  . ESOPHAGOGASTRODUODENOSCOPY N/A 08/27/2018   Procedure: ESOPHAGOGASTRODUODENOSCOPY (EGD);  Surgeon: Milus Banister, MD;  Location: [facility];  Service: Endoscopy;  Laterality: N/A;  . ESOPHAGOGASTRODUODENOSCOPY (EGD) WITH PROPOFOL N/A 04/13/2017   Procedure: ESOPHAGOGASTRODUODENOSCOPY (EGD) WITH PROPOFOL;  Surgeon: Doran Stabler, MD;  [facility]: [facility];  Service: Gastroenterology;  Laterality: N/A;  . ESOPHAGOGASTRODUODENOSCOPY (EGD) WITH PROPOFOL N/A 05/02/2017   Procedure: ESOPHAGOGASTRODUODENOSCOPY (EGD) WITH PROPOFOL;  Surgeon: Gatha Mayer, MD;  [facility]: [facility];  Service: Endoscopy;  Laterality: N/A;  . ESOPHAGOGASTRODUODENOSCOPY (EGD) WITH PROPOFOL N/A 09/30/2017   Procedure: ESOPHAGOGASTRODUODENOSCOPY (EGD) WITH PROPOFOL;  Surgeon: Gatha Mayer, MD;  [facility];  Service: Endoscopy;  Laterality: N/A;  . ESOPHAGOGASTRODUODENOSCOPY (EGD) WITH PROPOFOL N/A 06/09/2018   Procedure: ESOPHAGOGASTRODUODENOSCOPY (EGD) WITH PROPOFOL;  Surgeon: Gatha Mayer, MD;  [facility];  Service: Endoscopy;  Laterality: N/A;  . ESOPHAGOGASTRODUODENOSCOPY (EGD) WITH PROPOFOL N/A 01/05/2019   Procedure: ESOPHAGOGASTRODUODENOSCOPY (EGD) WITH PROPOFOL;  Surgeon: Gatha Mayer, MD;  Location[facility];  Service: Endoscopy;  Laterality: N/A;  . EUS N/A 08/27/2018   Procedure: UPPER ENDOSCOPIC ULTRASOUND (EUS) RADIAL;  Surgeon: Milus Banister, MD;  Location: [facility];  Service: Endoscopy;  Laterality: N/A;  . EYE SURGERY    . FLEXIBLE SIGMOIDOSCOPY  01/07/2012   Procedure: FLEXIBLE SIGMOIDOSCOPY;  Surgeon: Lafayette Dragon, MD;  Location: [facility];  Service: Endoscopy;  Laterality: N/A;  . IR RADIOLOGIST EVAL & MGMT  05/26/2019  . KNEE ARTHROSCOPY  2008   Right  . KNEE ARTHROSCOPY  2008   Left  . POLYPECTOMY    . [place] THORA/PARACENTESIS  10/28/2011  . Varicocele repair  1980's    Social History:  reports that he  has been smoking cigarettes and e-cigarettes. He has been smoking about 0.50 packs per day. He has never used smokeless tobacco. He reports previous alcohol use. He reports current drug use. Drug: Marijuana.  Allergies:  Allergies  Allergen Reactions  . [place] Venom Anaphylaxis, Shortness Of Breath and Other (See Comments)    Throat closes up/eye swell shut  . Nsaids Other (See Comments)    Stomach pain, Bleeding risk    Family history:  Family History  Problem Relation Age of Onset  . Stroke Mother   . Diabetes Mother   . Lung cancer Brother   . Prostate cancer Brother   . Liver cancer Cousin   . Liver cancer Paternal Uncle   . Colon cancer Neg Hx   . Stomach cancer Neg Hx   . Esophageal cancer Neg Hx   . Colon polyps Neg Hx   . Rectal cancer Neg Hx   . Pancreatic cancer Neg Hx      Home Meds: Prior to Admission medications   Medication Sig Start Date End Date Taking? Authorizing Provider  apixaban (ELIQUIS) 5 MG TABS tablet Take 1 tablet (5 mg  total) by mouth 2 (two) times daily. 03/31/19   Barton Dubois, MD  buPROPion (WELLBUTRIN SR) 150 MG 12 hr tablet TAKE 1 TABLET BY MOUTH EVERY DAY Patient taking differently: Take 150 mg by mouth daily.  06/15/18   [name], MD  Eliquis DVT/PE Starter Pack (ELIQUIS STARTER PACK) 5 MG TABS Take as directed on package: start with two-5mg  tablets twice daily for 7 days. On day 8, switch to one-5mg  tablet twice daily. Patient taking differently: Take 5 mg by mouth daily. T 03/31/19   Barton Dubois, MD  ferrous sulfate 325 (65 FE) MG EC tablet Take 325 mg by mouth every other day.    [provider]  fluocinonide cream (LIDEX) [id number] % Apply 1 application topically 2 (two) times daily as needed (for skin rash/irritation). APPLY ON THE SKIN TWICE A DAY AS NEEDED FOR RASH 12/30/18   [name], MD  furosemide (LASIX) 40 MG tablet Take 2 tablets (80 mg total) by mouth daily. 01/05/19   Gatha Mayer, MD   HYDROcodone-acetaminophen (NORCO/VICODIN) 5-325 MG tablet Take 1 tablet by mouth 3 (three) times daily as needed (for pain.). 05/27/19   [name], MD  loperamide (IMODIUM A-D) 2 MG tablet Take 2 mg by mouth as needed for diarrhea or loose stools.    [provider]  midodrine (PROAMATINE) 2.5 MG tablet Take 1 tablet (2.5 mg total) by mouth 3 (three) times daily with meals. 05/11/19   Gatha Mayer, MD  mirtazapine (REMERON) 45 MG tablet TAKE 1 TABLET (45 MG TOTAL) BY MOUTH AT BEDTIME. 05/12/19   [name], MD  Multiple Vitamin (MULTIVITAMIN WITH MINERALS) TABS Take 1 tablet by mouth daily.     [provider]  nadolol (CORGARD) 40 MG tablet Take 20 mg by mouth daily.    [provider]  omeprazole (PRILOSEC) 40 MG capsule TAKE 1 CAPSULE BY MOUTH TWICE A DAY Patient taking differently: Take 40 mg by mouth 2 (two) times daily.  09/25/18   [name], MD  PREVIDENT 5000 BOOSTER PLUS 1.1 % PSTE Place 1 application onto teeth 2 (two) times daily.  03/30/18   [provider]  psyllium (METAMUCIL) 58.6 % powder Take 1 packet by mouth at bedtime.     [provider]  spironolactone (ALDACTONE) 50 MG tablet Take 4 tablets (200 mg total) by mouth daily. 11/16/18   Gatha Mayer, MD  vitamin C (ASCORBIC ACID) 250 MG tablet Take 250 mg by mouth daily.     [provider]    Physical Exam: Vitals:   05/28/19 1300 05/28/19 1330 05/28/19 1500 05/28/19 1703  BP:  110/67 124/83 139/67  Pulse: 61 78 68 70  Resp: 13 15 20 20   Temp:    97.8 F (36.6 C)  TempSrc:    Oral  SpO2: 100% 100% 100% 100%   Wt Readings from Last 3 Encounters:  05/10/19 75.3 kg  04/02/19 76.7 kg  03/30/19 73.9 kg   There is no height or weight on file to calculate BMI.  General exam: Appears calm and comfortable.  Not in physical distress Skin: No rashes, lesions or ulcers. HEENT: Atraumatic, normocephalic, supple neck, no obvious bleeding Lungs:  Clear to auscultation bilaterally CVS: Regular rate and rhythm, no murmur GI/Abd soft, distended from ascites, nontender, bowel sound present CNS: Alert, awake, oriented x3, but slow to respond and inconsistent in details of history Psychiatry: Mood appropriate Extremities: No pedal edema, no calf tenderness  Labs on Admission:   CBC: Recent Labs  Lab 05/28/19 1250  WBC 11.1*  NEUTROABS 9.1*  HGB 7.6*  HCT 25.1*  MCV 98.8  PLT [id number]    Basic Metabolic Panel: Recent Labs  Lab 05/28/19 1250  NA 133*  K 4.7  CL 98  CO2 24  GLUCOSE 106*  BUN 46*  CREATININE 2.51*  CALCIUM 8.4*    Liver Function Tests: Recent Labs  Lab 05/28/19 1250  AST 27  ALT 20  ALKPHOS 73  BILITOT 1.1  PROT 6.4*  ALBUMIN 2.7*   No results for input(s): LIPASE, AMYLASE in the last 168 hours. Recent Labs  Lab 05/28/19 1242  AMMONIA 33    Cardiac Enzymes: No results for input(s): CKTOTAL, CKMB, CKMBINDEX, TROPONINI in the last 168 hours.  BNP (last 3 results) No results for input(s): BNP in the last 8760 hours.  ProBNP (last 3 results) No results for input(s): PROBNP in the last 8760 hours.  CBG: No results for input(s): GLUCAP in the last 168 hours.  Lipase     Component Value Date/Time   LIPASE 36 04/13/2017 0531     Urinalysis    Component Value Date/Time   COLORURINE AMBER (A) 01/06/2012 0855   APPEARANCEUR CLEAR 01/06/2012 0855   LABSPEC 1.029 01/06/2012 0855   PHURINE 6.0 01/06/2012 0855   GLUCOSEU NEGATIVE 01/06/2012 0855   HGBUR NEGATIVE 01/06/2012 0855   BILIRUBINUR SMALL (A) 01/06/2012 0855   KETONESUR TRACE (A) 01/06/2012 0855   PROTEINUR NEGATIVE 01/06/2012 0855   UROBILINOGEN 0.2 01/06/2012 0855   NITRITE NEGATIVE 01/06/2012 0855   LEUKOCYTESUR NEGATIVE 01/06/2012 0855     Drugs of Abuse  No results found for: LABOPIA, COCAINSCRNUR, LABBENZ, AMPHETMU, THCU, LABBARB    Radiological Exams on Admission: Ct Head Wo Contrast  Result Date: 05/28/2019  CLINICAL DATA:  Altered mental status 1 month. Incontinent since yesterday. EXAM: CT HEAD WITHOUT CONTRAST TECHNIQUE: Contiguous axial images were obtained from the base of the skull through the vertex without intravenous contrast. COMPARISON:  None. FINDINGS: Brain: Ventricles, cisterns and other CSF spaces are normal. There is minimal chronic ischemic microvascular disease. There is no mass, mass effect, shift of midline structures or acute hemorrhage. No evidence of acute infarction. Vascular: No hyperdense vessel or unexpected calcification. Skull: Normal. Negative for fracture or focal lesion. Sinuses/Orbits: No acute finding. Other: None. IMPRESSION: 1.  No acute findings. 2.  Minimal chronic ischemic microvascular disease. Electronically Signed   By: Marin Olp M.D.   On: 05/28/2019 14:51   Dg Chest Port 1 View  Result Date: 05/28/2019 CLINICAL DATA:  Altered mental status, weakness. EXAM: PORTABLE CHEST 1 VIEW COMPARISON:  07/14/2018. FINDINGS: Trachea is midline. Heart size normal. Lungs are clear. Left costophrenic angle was not included on the image. No large pleural effusion. IMPRESSION: No acute findings. Electronically Signed   By: Lorin Picket M.D.   On: 05/28/2019 13:57   ----------------------------------------------------------------------------------------------------------------------------------------------------------- Severity of Illness: The appropriate patient status for this patient is INPATIENT. Inpatient status is judged to be reasonable and necessary in order to provide the required intensity of service to ensure the patient's safety. The patient's presenting symptoms, physical exam findings, and initial radiographic and laboratory data in the context of their chronic comorbidities is felt to place them at high risk for further clinical deterioration. Furthermore, it is not anticipated that the patient will be medically stable for discharge from the hospital within 2  midnights of admission. The following factors support the patient status  of inpatient.   " The patient's presenting symptoms include altered mental status. " The worrisome physical exam findings include ascites, dehydration altered mental status. " The initial radiographic and laboratory data are worrisome because of AKI. " The chronic co-morbidities include cirrhosis, gi bleed.   * I certify that at the point of admission it is my clinical judgment that the patient will require inpatient hospital care spanning beyond 2 midnights from the point of admission due to high intensity of service, high risk for further deterioration and high frequency of surveillance required.*   Signed, Terrilee Croak, MD [facility] 05/28/2019

## 2019-05-28 NOTE — ED Triage Notes (Signed)
Pt BIBA from home.   Per EMS- Pt has been confused x1 month, as reported by family.  Pt has been incontinent since yesterday. Pt ambulatory with assitance on scene, able to follow commands.

## 2019-05-28 NOTE — Progress Notes (Signed)
Pt arrived to rm 1519. VS:  HR 70; BP 139/67; SPO2: 100% Temp: 97.8.

## 2019-05-28 NOTE — ED Notes (Signed)
Family at bedside. 

## 2019-05-28 NOTE — Telephone Encounter (Signed)
Left message for patient to call back  

## 2019-05-28 NOTE — ED Provider Notes (Addendum)
[facility] DEPT Provider Note   CSN: [id number] Arrival date & time: 05/28/19  1210     History   Chief Complaint Chief Complaint  Patient presents with   Altered Mental Status    HPI Billy [name] is a 73 y.o. male.     73 year old male who presents with with altered status x1 month which has been worse the last 2 days which has involved bladder incontinence.  Patient is on Eliquis and does have a history of alcoholism.  Was noted to have abnormal pupils today.  Patient himself also has dementia and therefore history is very limited.  He denies any complaints of headache, chest or abdominal discomfort.  Brought here for further management     Past Medical History:  Diagnosis Date   Acute deep vein thrombosis (DVT) of femoral vein of right lower extremity ([place]) 03/30/2019   Acute posthemorrhagic anemia    Alcoholic cirrhosis of liver ([place]) 09/24/2011   Years of alcohol use. Progressed from fatty liver to cirrhosis based upon review imaging. Manifestations are portal hypertension with esophageal varices, portal gastropathy, rectal varices and he may have portal colopathy. Ascites also present. Naive to HAV and HBV Claims he has had pneumococcal vaccine    Alcoholism ([place])    Angiodysplasia of colon 09/11/2011   Multiple in the right colon, ablated with APC. Question if this is portal colopathy.    Anxiety    Back pain    Basal cell carcinoma    Bilateral varicoceles    Campylobacter diarrhea 01/07/2012   Cataract    removed   DDD (degenerative disc disease), lumbar    Depression    mild at most   Diverticulosis of colon    Erectile dysfunction    Gastric ulcer 08/2011   small, antral ulcer   GERD (gastroesophageal reflux disease)    takes Omeprazole prn   GI hemorrhage 2006, 2013   esophagitis and 1+ varices, melena in 2013    Grade I diastolic dysfunction    Hearing loss    History of ascites    History of  blood transfusion    as a child and no abnormal reaction noted   History of bronchitis    >97yrs ago   History of dizziness    Hydrocele    Hypertension    takes Nadolol daily   Narcotic dependence ([place])    Personal history of colonic polyps    Portal hypertension ([place]) 08/2011   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   Right hydrocele    Scrotal edema    Sleep disturbance    takes Remeron nightly   Splenomegaly    Thrombocytopenia, unspecified (HCC)    Varices, 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   Grade 2, mid-esophagus    Patient Active Problem List   Diagnosis Date Noted   Light headed 05/10/2019   Chronic renal disease, stage III 04/02/2019   Gastroesophageal reflux disease with esophagitis    Acute deep vein thrombosis (DVT) of femoral vein of right lower extremity ([place]) 03/30/2019   Right leg DVT ([place])    Narcotic dependence ([place]) 05/19/2017   Secondary esophageal varices with bleeding (HCC)    Leg pain, left 04/24/2016   Ascites 11/20/2015   Scrotal edema 11/20/2015   Hydrocele in adult 11/20/2015   Alcohol dependence ([place]) 09/26/2014   Thrombocytopenia ([place]) 09/26/2014   Routine general medical examination at a health care facility [id number]   Alcoholic cirrhosis of liver ([place]) 09/24/2011   Portal hypertension ([place]) 09/11/2011  Angiodysplasia of colon 09/11/2011   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   Iron deficiency anemia due to chronic blood loss 09/05/2011   Personal history of colonic polyps 09/05/2011   DEGENERATIVE DISC DISEASE, LUMBAR SPINE 10/12/2007   SLEEP DISORDER 10/12/2007   Depression 08/06/2006   Essential hypertension, benign 08/06/2006   Portal hypertensive gastropathy ([place]) 08/06/2006   DIVERTICULOSIS, COLON 08/06/2006   Chronic back pain greater than 3 months duration 08/06/2006    Past Surgical History:  Procedure Laterality Date   BIOPSY  08/27/2018   Procedure: BIOPSY;  Surgeon: Milus Banister, MD;  Location: [facility];  Service: Endoscopy;;   CATARACT EXTRACTION  2013   bilateral with IOL   CHOLECYSTECTOMY  12/31/2012   Procedure: LAPAROSCOPIC CHOLECYSTECTOMY;  Surgeon: Zenovia Jarred, MD;  Location: [facility];  Service: General;;   COLONOSCOPY  2001, 200411/02/2006   COLONOSCOPY  09/11/2011   Procedure: COLONOSCOPY;  Surgeon: Gatha Mayer, MD;  Location: [facility];  Service: Endoscopy;  Laterality: N/A;   COLONOSCOPY     ESOPHAGEAL BANDING N/A 05/02/2017   Procedure: ESOPHAGEAL BANDING;  Surgeon: Gatha Mayer, MD;  Location: [facility];  Service: Endoscopy;  Laterality: N/A;   ESOPHAGEAL BANDING  06/09/2018   Procedure: ESOPHAGEAL BANDING;  Surgeon: Gatha Mayer, MD;  Location: [facility];  Service: Endoscopy;;   ESOPHAGEAL BANDING  08/27/2018   Procedure: ESOPHAGEAL BANDING;  Surgeon: Milus Banister, MD;  Location: [facility];  Service: Endoscopy;;   ESOPHAGOGASTRODUODENOSCOPY  2006   ESOPHAGOGASTRODUODENOSCOPY  09/11/2011   Procedure: ESOPHAGOGASTRODUODENOSCOPY (EGD);  Surgeon: Gatha Mayer, MD;  Location: [facility];  Service: Endoscopy;  Laterality: N/A;   ESOPHAGOGASTRODUODENOSCOPY N/A 08/27/2018   Procedure: ESOPHAGOGASTRODUODENOSCOPY (EGD);  Surgeon: Milus Banister, MD;  Location[facility];  Service: Endoscopy;  Laterality: N/A;   ESOPHAGOGASTRODUODENOSCOPY (EGD) WITH PROPOFOL N/A 04/13/2017   Procedure: ESOPHAGOGASTRODUODENOSCOPY (EGD) WITH PROPOFOL;  Surgeon: Doran Stabler, MD;  Location: [facility];  Service: Gastroenterology;  Laterality: N/A;   ESOPHAGOGASTRODUODENOSCOPY (EGD) WITH PROPOFOL N/A 05/02/2017   Procedure: ESOPHAGOGASTRODUODENOSCOPY (EGD) WITH PROPOFOL;  Surgeon: Gatha Mayer, MD;  [facility];  Service: Endoscopy;  Laterality: N/A;   ESOPHAGOGASTRODUODENOSCOPY (EGD) WITH PROPOFOL N/A 09/30/2017   Procedure: ESOPHAGOGASTRODUODENOSCOPY (EGD) WITH PROPOFOL;  Surgeon: Gatha Mayer, MD;  Location: [facility];   Service: Endoscopy;  Laterality: N/A;   ESOPHAGOGASTRODUODENOSCOPY (EGD) WITH PROPOFOL N/A 06/09/2018   Procedure: ESOPHAGOGASTRODUODENOSCOPY (EGD) WITH PROPOFOL;  Surgeon: Gatha Mayer, MD;  [facility];  Service: Endoscopy;  Laterality: N/A;   ESOPHAGOGASTRODUODENOSCOPY (EGD) WITH PROPOFOL N/A 01/05/2019   Procedure: ESOPHAGOGASTRODUODENOSCOPY (EGD) WITH PROPOFOL;  Surgeon: Gatha Mayer, MD;  Location: [facility];  Service: Endoscopy;  Laterality: N/A;   EUS N/A 08/27/2018   Procedure: UPPER ENDOSCOPIC ULTRASOUND (EUS) RADIAL;  Surgeon: Milus Banister, MD;  Location: [facility];  Service: Endoscopy;  Laterality: N/A;   EYE SURGERY     FLEXIBLE SIGMOIDOSCOPY  01/07/2012   Procedure: FLEXIBLE SIGMOIDOSCOPY;  Surgeon: Lafayette Dragon, MD;  Location: [facility];  Service: Endoscopy;  Laterality: N/A;   IR RADIOLOGIST EVAL & MGMT  05/26/2019   KNEE ARTHROSCOPY  2008   Right   KNEE ARTHROSCOPY  2008   Left   POLYPECTOMY     [place] THORA/PARACENTESIS  10/28/2011   Varicocele repair  1980's        Home Medications    Prior to Admission medications   Medication Sig Start Date End Date Taking? Authorizing Provider  apixaban (ELIQUIS) 5 MG TABS tablet Take 1 tablet (5 mg total) by mouth 2 (two) times daily. 03/31/19   Barton Dubois, MD  buPROPion (WELLBUTRIN SR) 150 MG 12 hr tablet TAKE 1 TABLET BY MOUTH EVERY DAY Patient taking differently: Take 150 mg by mouth daily.  06/15/18   [name], MD  Eliquis DVT/PE Starter Pack (ELIQUIS STARTER PACK) 5 MG TABS Take as directed on package: start with two-5mg  tablets twice daily for 7 days. On day 8, switch to one-5mg  tablet twice daily. Patient taking differently: Take 5 mg by mouth daily. T 03/31/19   Barton Dubois, MD  ferrous sulfate 325 (65 FE) MG EC tablet Take 325 mg by mouth every other day.    [provider]  fluocinonide cream (LIDEX) [id number] % Apply 1 application topically 2 (two) times daily as  needed (for skin rash/irritation). APPLY ON THE SKIN TWICE A DAY AS NEEDED FOR RASH 12/30/18   [name], MD  furosemide (LASIX) 40 MG tablet Take 2 tablets (80 mg total) by mouth daily. 01/05/19   Gatha Mayer, MD  HYDROcodone-acetaminophen (NORCO/VICODIN) 5-325 MG tablet Take 1 tablet by mouth 3 (three) times daily as needed (for pain.). 05/27/19   [name], MD  loperamide (IMODIUM A-D) 2 MG tablet Take 2 mg by mouth as needed for diarrhea or loose stools.    [provider]  midodrine (PROAMATINE) 2.5 MG tablet Take 1 tablet (2.5 mg total) by mouth 3 (three) times daily with meals. 05/11/19   Gatha Mayer, MD  mirtazapine (REMERON) 45 MG tablet TAKE 1 TABLET (45 MG TOTAL) BY MOUTH AT BEDTIME. 05/12/19   [name], MD  Multiple Vitamin (MULTIVITAMIN WITH MINERALS) TABS Take 1 tablet by mouth daily.     [provider]  nadolol (CORGARD) 40 MG tablet Take 20 mg by mouth daily.    [provider]  omeprazole (PRILOSEC) 40 MG capsule TAKE 1 CAPSULE BY MOUTH TWICE A DAY Patient taking differently: Take 40 mg by mouth 2 (two) times daily.  09/25/18   [name], MD  PREVIDENT 5000 BOOSTER PLUS 1.1 % PSTE Place 1 application onto teeth 2 (two) times daily.  03/30/18   [provider]  psyllium (METAMUCIL) 58.6 % powder Take 1 packet by mouth at bedtime.     [provider]  spironolactone (ALDACTONE) 50 MG tablet Take 4 tablets (200 mg total) by mouth daily. 11/16/18   Gatha Mayer, MD  vitamin C (ASCORBIC ACID) 250 MG tablet Take 250 mg by mouth daily.     [provider]    Family History Family History  Problem Relation Age of Onset   Stroke Mother    Diabetes Mother    Lung cancer Brother    Prostate cancer Brother    Liver cancer Cousin    Liver cancer Paternal Uncle    Colon cancer Neg Hx    Stomach cancer Neg Hx    Esophageal cancer Neg Hx    Colon polyps Neg Hx    Rectal cancer Neg Hx     Pancreatic cancer Neg Hx     Social History Social History   Tobacco Use   Smoking status: Light Tobacco Smoker    Packs/day: 0.50    Types: Cigarettes, E-cigarettes   Smokeless tobacco: Never Used  Substance Use Topics   Alcohol use: Not Currently    Alcohol/week: 0.0 standard drinks   Drug use: Yes  Types: Marijuana    Comment: occasionally, last time couple of weeks ago     Allergies   Yellow jacket venom and Nsaids   Review of Systems Review of Systems  Unable to perform ROS: Dementia     Physical Exam Updated Vital Signs BP (!) 121/97 (BP Location: Left Arm)    Pulse 65    Temp 97.7 F (36.5 C) (Oral)    Resp 17    SpO2 100%   Physical Exam Vitals signs and nursing note reviewed.  Constitutional:      General: He is not in acute distress.    Appearance: Normal appearance. He is well-developed. He is not toxic-appearing.  HENT:     Head: Normocephalic and atraumatic.  Eyes:     General: Lids are normal.     Conjunctiva/sclera: Conjunctivae normal.     Pupils: Pupils are equal, round, and reactive to light.     Comments: Left pupil is 6 mm and minimally reactive.  Right pupil is 4 mm and slightly reactive  Neck:     Musculoskeletal: Normal range of motion and neck supple.     Thyroid: No thyroid mass.     Trachea: No tracheal deviation.  Cardiovascular:     Rate and Rhythm: Normal rate and regular rhythm.     Heart sounds: Normal heart sounds. No murmur. No gallop.   Pulmonary:     Effort: Pulmonary effort is normal. No respiratory distress.     Breath sounds: Normal breath sounds. No stridor. No decreased breath sounds, wheezing, rhonchi or rales.  Abdominal:     General: Bowel sounds are normal. There is no distension.     Palpations: Abdomen is soft.     Tenderness: There is no abdominal tenderness. There is no rebound.  Musculoskeletal: Normal range of motion.        General: No tenderness.  Skin:    General: Skin is warm and dry.      Findings: No abrasion or rash.  Neurological:     Mental Status: He is alert. He is disoriented.     GCS: GCS eye subscore is 4. GCS verbal subscore is 5. GCS motor subscore is 6.     Cranial Nerves: No cranial nerve deficit or dysarthria.     Sensory: No sensory deficit.     Comments: Strength is 5 of 5 in upper as well as lower extremities  Psychiatric:        Attention and Perception: Attention normal.        Mood and Affect: Affect is flat.        Speech: Speech normal.      ED Treatments / Results  Labs (all labs ordered are listed, but only abnormal results are displayed) Labs Reviewed  CBC WITH DIFFERENTIAL/PLATELET  COMPREHENSIVE METABOLIC PANEL  AMMONIA  URINALYSIS, ROUTINE W REFLEX MICROSCOPIC    EKG None  Radiology Ir Radiologist Eval & Mgmt  Result Date: 05/26/2019 Please refer to notes tab for details about interventional procedure. (Op Note)   Procedures Procedures (including critical care time)  Medications Ordered in ED Medications  0.9 %  sodium chloride infusion (has no administration in time range)     Initial Impression / Assessment and Plan / ED Course  I have reviewed the triage vital signs and the nursing notes.  Pertinent labs & imaging results that were available during my care of the patient were reviewed by me and considered in my medical decision making (see chart for  details).        Patient's hemoglobin noted and appears to be stable.  Does have evidence of acute kidney injury.  Likely from dehydration and will give IV fluids here and admit to the hospitalist service.  3:37 PM Wife reports that patient has been having dark stools and he is on Eliquis.  Will have nurse guaiac stools and patient to be admitted at hospital Final Clinical Impressions(s) / ED Diagnoses   Final diagnoses:  None    ED Discharge Orders    None       Lacretia Leigh, MD 05/28/19 1506    Lacretia Leigh, MD 05/28/19 1537

## 2019-05-28 NOTE — ED Notes (Signed)
Patient transported to CT 

## 2019-05-28 NOTE — Telephone Encounter (Signed)
Patient is in the ED with acute kidney injury.  FYI

## 2019-05-29 ENCOUNTER — Observation Stay (HOSPITAL_COMMUNITY): Payer: 59

## 2019-05-29 ENCOUNTER — Other Ambulatory Visit: Payer: Self-pay

## 2019-05-29 DIAGNOSIS — F329 Major depressive disorder, single episode, unspecified: Secondary | ICD-10-CM | POA: Diagnosis present

## 2019-05-29 DIAGNOSIS — E860 Dehydration: Secondary | ICD-10-CM | POA: Diagnosis present

## 2019-05-29 DIAGNOSIS — R410 Disorientation, unspecified: Secondary | ICD-10-CM | POA: Diagnosis not present

## 2019-05-29 DIAGNOSIS — K766 Portal hypertension: Secondary | ICD-10-CM | POA: Diagnosis present

## 2019-05-29 DIAGNOSIS — K7031 Alcoholic cirrhosis of liver with ascites: Secondary | ICD-10-CM | POA: Diagnosis present

## 2019-05-29 DIAGNOSIS — D62 Acute posthemorrhagic anemia: Secondary | ICD-10-CM | POA: Diagnosis present

## 2019-05-29 DIAGNOSIS — N179 Acute kidney failure, unspecified: Secondary | ICD-10-CM

## 2019-05-29 DIAGNOSIS — Z20828 Contact with and (suspected) exposure to other viral communicable diseases: Secondary | ICD-10-CM | POA: Diagnosis present

## 2019-05-29 DIAGNOSIS — F0390 Unspecified dementia without behavioral disturbance: Secondary | ICD-10-CM | POA: Diagnosis present

## 2019-05-29 DIAGNOSIS — H9190 Unspecified hearing loss, unspecified ear: Secondary | ICD-10-CM | POA: Diagnosis present

## 2019-05-29 DIAGNOSIS — K31819 Angiodysplasia of stomach and duodenum without bleeding: Secondary | ICD-10-CM | POA: Diagnosis present

## 2019-05-29 DIAGNOSIS — G9341 Metabolic encephalopathy: Secondary | ICD-10-CM | POA: Diagnosis present

## 2019-05-29 DIAGNOSIS — D509 Iron deficiency anemia, unspecified: Secondary | ICD-10-CM | POA: Diagnosis present

## 2019-05-29 DIAGNOSIS — K921 Melena: Secondary | ICD-10-CM | POA: Diagnosis present

## 2019-05-29 DIAGNOSIS — K922 Gastrointestinal hemorrhage, unspecified: Secondary | ICD-10-CM | POA: Diagnosis present

## 2019-05-29 DIAGNOSIS — K274 Chronic or unspecified peptic ulcer, site unspecified, with hemorrhage: Secondary | ICD-10-CM | POA: Diagnosis not present

## 2019-05-29 DIAGNOSIS — G479 Sleep disorder, unspecified: Secondary | ICD-10-CM | POA: Diagnosis present

## 2019-05-29 DIAGNOSIS — K2101 Gastro-esophageal reflux disease with esophagitis, with bleeding: Secondary | ICD-10-CM | POA: Diagnosis present

## 2019-05-29 DIAGNOSIS — N1832 Chronic kidney disease, stage 3b: Secondary | ICD-10-CM | POA: Diagnosis present

## 2019-05-29 DIAGNOSIS — I8510 Secondary esophageal varices without bleeding: Secondary | ICD-10-CM | POA: Diagnosis not present

## 2019-05-29 DIAGNOSIS — F1020 Alcohol dependence, uncomplicated: Secondary | ICD-10-CM | POA: Diagnosis present

## 2019-05-29 DIAGNOSIS — I129 Hypertensive chronic kidney disease with stage 1 through stage 4 chronic kidney disease, or unspecified chronic kidney disease: Secondary | ICD-10-CM | POA: Diagnosis present

## 2019-05-29 DIAGNOSIS — Z86718 Personal history of other venous thrombosis and embolism: Secondary | ICD-10-CM | POA: Diagnosis not present

## 2019-05-29 DIAGNOSIS — G8929 Other chronic pain: Secondary | ICD-10-CM | POA: Diagnosis present

## 2019-05-29 DIAGNOSIS — R64 Cachexia: Secondary | ICD-10-CM | POA: Diagnosis present

## 2019-05-29 DIAGNOSIS — I82411 Acute embolism and thrombosis of right femoral vein: Secondary | ICD-10-CM | POA: Diagnosis not present

## 2019-05-29 DIAGNOSIS — K7290 Hepatic failure, unspecified without coma: Secondary | ICD-10-CM | POA: Diagnosis present

## 2019-05-29 DIAGNOSIS — F1120 Opioid dependence, uncomplicated: Secondary | ICD-10-CM | POA: Diagnosis present

## 2019-05-29 DIAGNOSIS — F419 Anxiety disorder, unspecified: Secondary | ICD-10-CM | POA: Diagnosis present

## 2019-05-29 DIAGNOSIS — I8500 Esophageal varices without bleeding: Secondary | ICD-10-CM | POA: Diagnosis present

## 2019-05-29 LAB — HEMOGLOBIN AND HEMATOCRIT, BLOOD
HCT: 25.5 % — ABNORMAL LOW (ref 39.0–52.0)
HCT: 26.4 % — ABNORMAL LOW (ref 39.0–52.0)
Hemoglobin: 7.7 g/dL — ABNORMAL LOW (ref 13.0–17.0)
Hemoglobin: 8.1 g/dL — ABNORMAL LOW (ref 13.0–17.0)

## 2019-05-29 LAB — MAGNESIUM: Magnesium: 1.9 mg/dL (ref 1.7–2.4)

## 2019-05-29 LAB — CBC
HCT: 22.8 % — ABNORMAL LOW (ref 39.0–52.0)
Hemoglobin: 7 g/dL — ABNORMAL LOW (ref 13.0–17.0)
MCH: 30.2 pg (ref 26.0–34.0)
MCHC: 30.7 g/dL (ref 30.0–36.0)
MCV: 98.3 fL (ref 80.0–100.0)
Platelets: 188 10*3/uL (ref 150–400)
RBC: 2.32 MIL/uL — ABNORMAL LOW (ref 4.22–5.81)
RDW: 14.6 % (ref 11.5–15.5)
WBC: 9.7 10*3/uL (ref 4.0–10.5)
nRBC: 0 % (ref 0.0–0.2)

## 2019-05-29 LAB — PHOSPHORUS: Phosphorus: 3.8 mg/dL (ref 2.5–4.6)

## 2019-05-29 LAB — BASIC METABOLIC PANEL
Anion gap: 10 (ref 5–15)
BUN: 47 mg/dL — ABNORMAL HIGH (ref 8–23)
CO2: 23 mmol/L (ref 22–32)
Calcium: 8.4 mg/dL — ABNORMAL LOW (ref 8.9–10.3)
Chloride: 100 mmol/L (ref 98–111)
Creatinine, Ser: 2.13 mg/dL — ABNORMAL HIGH (ref 0.61–1.24)
GFR calc Af Amer: 35 mL/min — ABNORMAL LOW (ref >60)
GFR calc non Af Amer: 30 mL/min — ABNORMAL LOW (ref >60)
Glucose, Bld: 85 mg/dL (ref 70–99)
Potassium: 4.3 mmol/L (ref 3.5–5.1)
Sodium: 133 mmol/L — ABNORMAL LOW (ref 135–145)

## 2019-05-29 LAB — COMPREHENSIVE METABOLIC PANEL
ALT: 18 U/L (ref 0–44)
AST: 27 U/L (ref 15–41)
Albumin: 2.2 g/dL — ABNORMAL LOW (ref 3.5–5.0)
Alkaline Phosphatase: 70 U/L (ref 38–126)
Anion gap: 10 (ref 5–15)
BUN: 42 mg/dL — ABNORMAL HIGH (ref 8–23)
CO2: 23 mmol/L (ref 22–32)
Calcium: 8.1 mg/dL — ABNORMAL LOW (ref 8.9–10.3)
Chloride: 99 mmol/L (ref 98–111)
Creatinine, Ser: 1.88 mg/dL — ABNORMAL HIGH (ref 0.61–1.24)
GFR calc Af Amer: 41 mL/min — ABNORMAL LOW (ref >60)
GFR calc non Af Amer: 35 mL/min — ABNORMAL LOW (ref >60)
Glucose, Bld: 112 mg/dL — ABNORMAL HIGH (ref 70–99)
Potassium: 4.1 mmol/L (ref 3.5–5.1)
Sodium: 132 mmol/L — ABNORMAL LOW (ref 135–145)
Total Bilirubin: 1.1 mg/dL (ref 0.3–1.2)
Total Protein: 5.6 g/dL — ABNORMAL LOW (ref 6.5–8.1)

## 2019-05-29 LAB — PREPARE RBC (CROSSMATCH)

## 2019-05-29 LAB — PROTIME-INR
INR: 1.6 — ABNORMAL HIGH (ref 0.8–1.2)
Prothrombin Time: 18.4 seconds — ABNORMAL HIGH (ref 11.4–15.2)

## 2019-05-29 MED ORDER — FOLIC ACID 1 MG PO TABS
1.0000 mg | ORAL_TABLET | Freq: Every day | ORAL | Status: DC
  Administered 2019-05-29 – 2019-05-31 (×2): 1 mg via ORAL
  Filled 2019-05-29 (×2): qty 1

## 2019-05-29 MED ORDER — LORAZEPAM 1 MG PO TABS: 1.0000 mg | ORAL_TABLET | ORAL | Status: DC | PRN

## 2019-05-29 MED ORDER — LORAZEPAM 2 MG/ML IJ SOLN: 1.0000 mg | INTRAMUSCULAR | Status: DC | PRN

## 2019-05-29 MED ORDER — THIAMINE HCL 100 MG/ML IJ SOLN
100.0000 mg | Freq: Every day | INTRAMUSCULAR | Status: DC
  Administered 2019-05-30: 10:00:00 100 mg via INTRAVENOUS
  Filled 2019-05-29 (×2): qty 2

## 2019-05-29 MED ORDER — SODIUM CHLORIDE 0.9 % IV SOLN
2.0000 g | INTRAVENOUS | Status: DC
  Administered 2019-05-29 – 2019-05-31 (×3): 2 g via INTRAVENOUS
  Filled 2019-05-29 (×3): qty 2

## 2019-05-29 MED ORDER — DOXYLAMINE SUCCINATE (SLEEP) 25 MG PO TABS
25.0000 mg | ORAL_TABLET | Freq: Every evening | ORAL | Status: DC | PRN
  Administered 2019-05-29: 22:00:00 25 mg via ORAL
  Filled 2019-05-29 (×3): qty 1

## 2019-05-29 MED ORDER — SODIUM CHLORIDE 0.9% IV SOLUTION
Freq: Once | INTRAVENOUS | Status: AC
  Administered 2019-05-29: 11:00:00 via INTRAVENOUS

## 2019-05-29 MED ORDER — ALBUMIN HUMAN 25 % IV SOLN
12.5000 g | Freq: Once | INTRAVENOUS | Status: DC | PRN
  Filled 2019-05-29: qty 50

## 2019-05-29 MED ORDER — VITAMIN B-1 100 MG PO TABS
100.0000 mg | ORAL_TABLET | Freq: Every day | ORAL | Status: DC
  Administered 2019-05-29 – 2019-05-31 (×2): 100 mg via ORAL
  Filled 2019-05-29 (×2): qty 1

## 2019-05-29 NOTE — Consult Note (Signed)
Consult Note for [facility]  Reason for Consult: Anemia and heme positive stool Referring Physician: [facility]  Billy [name] HPI: This is a 73 year old male with a PMH of ETOH cirrhosis, DVT on anticoagulation since September, and esophageal varices s/p banding.  The patient was admitted for AMS and there was a report by his wife that he continues to drink.  Today he is alert and oriented.  His HGB was noted to be at 7.0 g/dL from a baseline of 11-12 g/dL.  He was started on Eliquis in September when he was noted to have a DVT of his right lower extremity.  He denies any issues with hematochezia, melena, or hematemesis.  Past Medical History:  Diagnosis Date  . Acute deep vein thrombosis (DVT) of femoral vein of right lower extremity ([place]) 03/30/2019  . Acute posthemorrhagic anemia   . Alcoholic cirrhosis of liver ([place]) 09/24/2011   Years of alcohol use. Progressed from fatty liver to cirrhosis based upon review imaging. Manifestations are portal hypertension with esophageal varices, portal gastropathy, rectal varices and he may have portal colopathy. Ascites also present. Naive to HAV and HBV Claims he has had pneumococcal vaccine   . Alcoholism ([place])   . Angiodysplasia of colon 09/11/2011   Multiple in the right colon, ablated with APC. Question if this is portal colopathy.   . Anxiety   . Back pain   . Basal cell carcinoma   . Bilateral varicoceles   . Campylobacter diarrhea 01/07/2012  . Cataract    removed  . DDD (degenerative disc disease), lumbar   . Depression    mild at most  . Diverticulosis of colon   . Erectile dysfunction   . Gastric ulcer 08/2011   small, antral ulcer  . GERD (gastroesophageal reflux disease)    takes Omeprazole prn  . GI hemorrhage 2006, 2013   esophagitis and 1+ varices, melena in 2013   . Grade I diastolic dysfunction   . Hearing loss   . History of ascites   . History of blood transfusion    as a child and no abnormal reaction noted   . History of bronchitis    >68yrs ago  . History of dizziness   . Hydrocele   . Hypertension    takes Nadolol daily  . Narcotic dependence ([place])   . Personal history of colonic polyps   . Portal hypertension ([place]) 08/2011  . 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 Right hydrocele   . Scrotal edema   . Sleep disturbance    takes Remeron nightly  . Splenomegaly   . Thrombocytopenia, unspecified ([place])   . Varices, esophageal ([place]) 08/2011   Grade 2, mid-esophagus    Past Surgical History:  Procedure Laterality Date  . BIOPSY  08/27/2018   Procedure: BIOPSY;  Surgeon: Milus Banister, MD;  Location: [facility];  Service: Endoscopy;;  . CATARACT EXTRACTION  2013   bilateral with IOL  . CHOLECYSTECTOMY  12/31/2012   Procedure: LAPAROSCOPIC CHOLECYSTECTOMY;  Surgeon: Zenovia Jarred, MD;  Location: [facility];  Service: General;;  . COLONOSCOPY  2001, 200411/02/2006  . COLONOSCOPY  09/11/2011   Procedure: COLONOSCOPY;  Surgeon: Gatha Mayer, MD;  Location: [facility];  Service: Endoscopy;  Laterality: N/A;  . COLONOSCOPY    . ESOPHAGEAL BANDING N/A 05/02/2017   Procedure: ESOPHAGEAL BANDING;  Surgeon: Gatha Mayer, MD;  [facility];  Service: Endoscopy;  Laterality: N/A;  . ESOPHAGEAL BANDING  06/09/2018   Procedure:  ESOPHAGEAL BANDING;  Surgeon: Gatha Mayer, MD;  [facility];  Service: Endoscopy;;  . ESOPHAGEAL BANDING  08/27/2018   Procedure: ESOPHAGEAL BANDING;  Surgeon: Milus Banister, MD;  [facility];  Service: Endoscopy;;  . ESOPHAGOGASTRODUODENOSCOPY  2006  . ESOPHAGOGASTRODUODENOSCOPY  09/11/2011   Procedure: ESOPHAGOGASTRODUODENOSCOPY (EGD);  Surgeon: Gatha Mayer, MD;  Location: [facility];  Service: Endoscopy;  Laterality: N/A;  . ESOPHAGOGASTRODUODENOSCOPY N/A 08/27/2018   Procedure: ESOPHAGOGASTRODUODENOSCOPY (EGD);  Surgeon: Milus Banister, MD;  Location: [facility];  Service: Endoscopy;  Laterality: N/A;  .  ESOPHAGOGASTRODUODENOSCOPY (EGD) WITH PROPOFOL N/A 04/13/2017   Procedure: ESOPHAGOGASTRODUODENOSCOPY (EGD) WITH PROPOFOL;  Surgeon: Doran Stabler, MD;  [facility];  Service: Gastroenterology;  Laterality: N/A;  . ESOPHAGOGASTRODUODENOSCOPY (EGD) WITH PROPOFOL N/A 05/02/2017   Procedure: ESOPHAGOGASTRODUODENOSCOPY (EGD) WITH PROPOFOL;  Surgeon: Gatha Mayer, MD;  [facility];  Service: Endoscopy;  Laterality: N/A;  . ESOPHAGOGASTRODUODENOSCOPY (EGD) WITH PROPOFOL N/A 09/30/2017   Procedure: ESOPHAGOGASTRODUODENOSCOPY (EGD) WITH PROPOFOL;  Surgeon: Gatha Mayer, MD;  [facility];  Service: Endoscopy;  Laterality: N/A;  . ESOPHAGOGASTRODUODENOSCOPY (EGD) WITH PROPOFOL N/A 06/09/2018   Procedure: ESOPHAGOGASTRODUODENOSCOPY (EGD) WITH PROPOFOL;  Surgeon: Gatha Mayer, MD;  [facility];  Service: Endoscopy;  Laterality: N/A;  . ESOPHAGOGASTRODUODENOSCOPY (EGD) WITH PROPOFOL N/A 01/05/2019   Procedure: ESOPHAGOGASTRODUODENOSCOPY (EGD) WITH PROPOFOL;  Surgeon: Gatha Mayer, MD;  [facility];  Service: Endoscopy;  Laterality: N/A;  . EUS N/A 08/27/2018   Procedure: UPPER ENDOSCOPIC ULTRASOUND (EUS) RADIAL;  Surgeon: Milus Banister, MD;  [facility];  Service: Endoscopy;  Laterality: N/A;  . EYE SURGERY    . FLEXIBLE SIGMOIDOSCOPY  01/07/2012   Procedure: FLEXIBLE SIGMOIDOSCOPY;  Surgeon: Lafayette Dragon, MD;  [facility];  Service: Endoscopy;  Laterality: N/A;  . IR RADIOLOGIST EVAL & MGMT  05/26/2019  . KNEE ARTHROSCOPY  2008   Right  . KNEE ARTHROSCOPY  2008   Left  . POLYPECTOMY    . [place] THORA/PARACENTESIS  10/28/2011  . Varicocele repair  33's    Family History  Problem Relation Age of Onset  . Stroke Mother   . Diabetes Mother   . Lung cancer Brother   . Prostate cancer Brother   . Liver cancer Cousin   . Liver cancer Paternal Uncle   . Colon cancer Neg Hx   . Stomach cancer Neg Hx   . Esophageal  cancer Neg Hx   . Colon polyps Neg Hx   . Rectal cancer Neg Hx   . Pancreatic cancer Neg Hx     Social History:  reports that he has been smoking cigarettes and e-cigarettes. He has been smoking about 0.50 packs per day. He has never used smokeless tobacco. He reports previous alcohol use. He reports current drug use. Drug: Marijuana.  Allergies:  Allergies  Allergen Reactions  . Yellow Jacket Venom Anaphylaxis, Shortness Of Breath and Other (See Comments)    Throat closes up/eye swell shut  . Nsaids Other (See Comments)    Stomach pain, Bleeding risk    Medications:  Scheduled: . ferrous sulfate  325 mg Oral QODAY  . folic acid  1 mg Oral Daily  . midodrine  2.5 mg Oral TID WC  . multivitamin with minerals  1 tablet Oral Daily  . pantoprazole (PROTONIX) IV  40 mg Intravenous Q12H  . psyllium  1 packet Oral QHS  . senna  1 tablet Oral BID  .  thiamine  100 mg Oral Daily   Or  . thiamine  100 mg Intravenous Daily  . vitamin C  250 mg Oral Daily   Continuous: . sodium chloride    . sodium chloride    . sodium chloride 50 mL/hr at 05/29/19 0200  . albumin human    . cefTRIAXone (ROCEPHIN)  IV 2 g (05/29/19 1057)    Results for orders placed or performed during the hospital encounter of 05/28/19 (from the past 24 hour(s))  CBC     Status: Abnormal   Collection Time: 05/29/19  5:53 AM  Result Value Ref Range   WBC 9.7 4.0 - 10.5 K/uL   RBC 2.32 (L) 4.22 - 5.81 MIL/uL   Hemoglobin 7.0 (L) 13.0 - 17.0 g/dL   HCT 22.8 (L) 39.0 - 52.0 %   MCV 98.3 80.0 - 100.0 fL   MCH 30.2 26.0 - 34.0 pg   MCHC 30.7 30.0 - 36.0 g/dL   RDW 14.6 11.5 - 15.5 %   Platelets 188 150 - 400 K/uL   nRBC 0.0 0.0 - 0.2 %  Basic metabolic panel     Status: Abnormal   Collection Time: 05/29/19  5:53 AM  Result Value Ref Range   Sodium 133 (L) 135 - 145 mmol/L   Potassium 4.3 3.5 - 5.1 mmol/L   Chloride 100 98 - 111 mmol/L   CO2 23 22 - 32 mmol/L   Glucose, Bld 85 70 - 99 mg/dL   BUN 47 (H) 8 -  23 mg/dL   Creatinine, Ser 2.13 (H) 0.61 - 1.24 mg/dL   Calcium 8.4 (L) 8.9 - 10.3 mg/dL   GFR calc non Af Amer 30 (L) >60 mL/min   GFR calc Af Amer 35 (L) >60 mL/min   Anion gap 10 5 - 15  Type and screen [place]     Status: None (Preliminary result)   Collection Time: 05/29/19 10:52 AM  Result Value Ref Range   ABO/RH([name]) O POS    Antibody Screen NEG    Sample Expiration 06/01/2019,2359    Unit Number [id number]    Blood Component Type RED CELLS,LR    Unit division 00    Status of Unit ISSUED    Transfusion Status OK TO TRANSFUSE    Crossmatch Result      Compatible Performed at [facility] [address], [place] [address], [place], [place] [address]   Prepare RBC     Status: None   Collection Time: 05/29/19 10:52 AM  Result Value Ref Range   Order Confirmation      ORDER PROCESSED BY BLOOD BANK Performed at [facility], [place] [address] [place],  [address]      Ct Head Wo Contrast  Result Date: 05/28/2019 CLINICAL DATA:  Altered mental status 1 month. Incontinent since yesterday. EXAM: CT HEAD WITHOUT CONTRAST TECHNIQUE: Contiguous axial images were obtained from the base of the skull through the vertex without intravenous contrast. COMPARISON:  None. FINDINGS: Brain: Ventricles, cisterns and other CSF spaces are normal. There is minimal chronic ischemic microvascular disease. There is no mass, mass effect, shift of midline structures or acute hemorrhage. No evidence of acute infarction. Vascular: No hyperdense vessel or unexpected calcification. Skull: Normal. Negative for fracture or focal lesion. Sinuses/Orbits: No acute finding. Other: None. IMPRESSION: 1.  No acute findings. 2.  Minimal chronic ischemic microvascular disease. Electronically Signed   By: Marin Olp M.[name].   On: 05/28/2019 14:51  US Abdomen Complete  Result Date: 05/29/2019 CLINICAL DATA:  Ascites due to alcoholic cirrhosis. EXAM: ABDOMEN ULTRASOUND  COMPLETE COMPARISON:  01/14/2019 FINDINGS: Gallbladder: Status post cholecystectomy. Common bile duct: Diameter: 3.4 mm Liver: Cirrhotic liver appears shrunken with a nodular contour and heterogeneous echotexture. No focal liver abnormality. Portal vein is patent on color Doppler imaging with normal direction of blood flow towards the liver. IVC: No abnormality visualized. Pancreas: Visualized portion unremarkable. Spleen: Size and appearance within normal limits. Right Kidney: Length: 10.3 cm. Echogenicity within normal limits. No mass or hydronephrosis visualized. Left Kidney: Length: 9.6 cm. Echogenicity within normal limits. No mass or hydronephrosis visualized. Abdominal aorta: No aneurysm visualized. Other findings: Moderate volume of ascites identified within all 4 quadrants of the abdomen. IMPRESSION: 1. Morphologic features of liver compatible with cirrhosis. 2. Moderate volume of ascites is identified in all 4 quadrants of the abdomen. Electronically Signed   By: Kerby Moors M.[name].   On: 05/29/2019 11:17   Dg Chest Port 1 View  Result Date: 05/28/2019 CLINICAL DATA:  Altered mental status, weakness. EXAM: PORTABLE CHEST 1 VIEW COMPARISON:  07/14/2018. FINDINGS: Trachea is midline. Heart size normal. Lungs are clear. Left costophrenic angle was not included on the image. No large pleural effusion. IMPRESSION: No acute findings. Electronically Signed   By: Lorin Picket M.[name].   On: 05/28/2019 13:57    ROS:  As stated above in the HPI otherwise negative.  Blood pressure 121/60, pulse 82, temperature 98.7 F (37.1 C), temperature source Oral, resp. rate 16, height 6\' 3"  (1.905 m), weight 75 kg, SpO2 100 %.    PE: Gen: NAD, Alert and Oriented x 3 HEENT:  /AT, EOMI Neck: Supple, no LAD Lungs: CTA Bilaterally CV: RRR without M/G/R ABM: Soft, NTND, +BS Ext: No C/C/E, minimal axterixis.  Assessment/Plan: 1) Anemia. 2) Heme positive stool. 3) ETOH cirrhosis. 4) History of esophageal  varices. 5) Hepatic encephalopathy.   The drop in his HGB with Eliquis requires further evaluation.  He has a history of esophageal variceal bleeding and an EGD will be performed.  He should be switched over to Xifaxan or lactulose upon discharge.  Currently he is on CTX.  Plan; 1) EGD tomorrow.  Billy [name] 05/29/2019, 6:14 PM

## 2019-05-29 NOTE — Progress Notes (Addendum)
PROGRESS NOTE    Billy [name]    Code Status: Full Code  [id number] DOB: 1945-09-20 DOA: 05/28/2019  PCP: [name], MD    Hospital Summary  73 year old male with past medical history of alcoholic cirrhosis with portal hypertension, ascites, variceal bleeding being considered for TIPS, hypertension, chronic back pain on Norco, GERD, basal cell carcinoma, depression with recent diagnosis of DVT of right lower extremity in September 2020 started on Eliquis who presented to the ED from home via EMS for altered mental status for several weeks and acutely worsened over the past day prior to admission.  Patient noted to have stooled himself at home.  In the ED afebrile, heart rate in 60s, BP 121/97, comfortable on room air, sodium 133, potassium 4.7, BUN/creatinine 46/2.51 increased from 1 week ago, albumin 2.7, ammonia 33, WBC 11.1, hemoglobin 7.6, platelet 231.  CT head unremarkable for acute changes and chest x-ray normal.  Admitted for acute metabolic encephalopathy, AKI on CKD 3 and acute on chronic GI bleed in setting of Eliquis which was held on admission.  11/14: Started on Rocephin for SBP prophylaxis in cirrhotic for GI bleed, abdominal ultrasound ordered showing moderate ascites, paracentesis ordered with albumin (Cr> 1 at 2.13, BUN >30 at 47; total bili not elevated), GI consulted.  Encephalopathy noted to have resolved with hydration.  Transfuse 1 unit PRBCs for symptomatic anemia GI bleed with hemoglobin 7.0  A & P   Active Problems:   AMS (altered mental status)   GI bleed    Acute metabolic encephalopathy multifactorial factorial etiology: cirrhosis, AKI on CKD 3 and acute on chronic GI bleed in setting of Eliquis for DVT resolved with hydration.  Ammonia negative - Rocephin for SBP prophylaxis in cirrhotic for GI bleed - abdominal ultrasound ordered showing moderate ascites, paracentesis ordered with albumin (Cr> 1 at 2.13, BUN >30 at 47; total bili not elevated) -  GI consulted  Symptomatic anemia in setting of GI bleed while on Eliquis for DVT  hemoglobin 7.0.  Fatigued and pale -Transfuse 1 unit PRBCs and follow-up H&H following transfusion -Trend H&H -GI consulted -Hold Eliquis -Protonix twice daily  AKI on CKD creatinine 2.51 on admission, currently 2.13 with IV fluids, baseline 1.0-1.3.  Likely dehydration -Continue IV fluids  Moderate volume ascites Abdomen distended with ascites, nontender, leukocytosis 11.1 on admission->9.7 in setting of GI bleed -Paracentesis with labs -Albumin with paracentesis for possible SBP  -Rocephin prophylaxis  Alcoholic liver cirrhosis, Child Class C patient of Dr. Carlean Purl, GI.  Per recent notes, patient still drinking, patient reported his last drink was 3 weeks ago -Home Lasix, Aldactone and nadolol on hold -Continue midodrine -CIWA protocol -INR  Right femoral vein DVT on Eliquis -Hold Eliquis due to GI bleed.  Patient understands this puts him at risk of further clot burden  DVT prophylaxis: SCDs Diet: N.p.o. Family Communication: Patient stated he would update his wife personally.  No family at bedside Disposition Plan: Made inpatient today, possible discharge in 1 to 2 days pending GI work-up  Consultants  GI  Procedures  1 unit PRBC transfusion 11/14  Antibiotics  Rocephin 11/14 ->      Subjective   Patient seen and examined at bedside resting comfortably in no acute distress. States he feels better than he did yesterday but very tired. Recalls yesterday morning waking up to EMS and laying in stool. His wife called EMS. Patient reports that he has not had a drink for 3 weeks but according to previous  notes he is still drinking. Denies any further bloody stool, abdominal pain, fever, nausea, vomiting, chest pain, shortness of breath. Remaining ROS negative.   Objective   Vitals:   05/29/19 0500 05/29/19 1251 05/29/19 1325 05/29/19 1335  BP: 127/63 129/62 126/64 126/64  Pulse: 74 82  78 78  Resp: 19 16 16 16   Temp: 98.4 F (36.9 C) 98.7 F (37.1 C) 98.6 F (37 C) 98.6 F (37 C)  TempSrc: Oral Oral  Oral  SpO2: 100% 100%  100%  Weight:      Height:        Intake/Output Summary (Last 24 hours) at 05/29/2019 1632 Last data filed at 05/29/2019 1500 Gross per 24 hour  Intake 777.32 ml  Output 0 ml  Net 777.32 ml   Filed Weights   05/28/19 2234  Weight: 75 kg    Examination:  Physical Exam Vitals signs and nursing note reviewed.  Constitutional:      Comments: cachectic  HENT:     Head:     Comments: Temporal muscle wasting Eyes:     Extraocular Movements: Extraocular movements intact.     Comments: Right eye pupil > Left Eye pupil (chronic)  Cardiovascular:     Rate and Rhythm: Normal rate and regular rhythm.  Pulmonary:     Effort: Pulmonary effort is normal.     Breath sounds: Normal breath sounds.  Abdominal:     General: There is distension.     Tenderness: There is no abdominal tenderness.     Comments: +Hepatomegaly  Musculoskeletal: Normal range of motion.        General: No swelling.  Skin:    Coloration: Skin is pale.  Neurological:     Mental Status: He is alert and oriented to person, place, and time.  Psychiatric:        Mood and Affect: Mood normal.        Behavior: Behavior normal.     Data Reviewed: I have personally reviewed following labs and imaging studies  CBC: Recent Labs  Lab 05/28/19 1250 05/29/19 0553  WBC 11.1* 9.7  NEUTROABS 9.1*  --   HGB 7.6* 7.0*  HCT 25.1* 22.8*  MCV 98.8 98.3  PLT 231 0000000   Basic Metabolic Panel: Recent Labs  Lab 05/28/19 1250 05/29/19 0553  NA 133* 133*  K 4.7 4.3  CL 98 100  CO2 24 23  GLUCOSE 106* 85  BUN 46* 47*  CREATININE 2.51* 2.13*  CALCIUM 8.4* 8.4*   GFR: Estimated Creatinine Clearance: 33.7 mL/min (A) (by C-G formula based on SCr of 2.13 mg/dL (H)). Liver Function Tests: Recent Labs  Lab 05/28/19 1250  AST 27  ALT 20  ALKPHOS 73  BILITOT 1.1  PROT  6.4*  ALBUMIN 2.7*   No results for input(s): LIPASE, AMYLASE in the last 168 hours. Recent Labs  Lab 05/28/19 1242  AMMONIA 33   Coagulation Profile: No results for input(s): INR, PROTIME in the last 168 hours. Cardiac Enzymes: No results for input(s): CKTOTAL, CKMB, CKMBINDEX, TROPONINI in the last 168 hours. BNP (last 3 results) No results for input(s): PROBNP in the last 8760 hours. HbA1C: No results for input(s): HGBA1C in the last 72 hours. CBG: No results for input(s): GLUCAP in the last 168 hours. Lipid Profile: No results for input(s): CHOL, HDL, LDLCALC, TRIG, CHOLHDL, LDLDIRECT in the last 72 hours. Thyroid Function Tests: No results for input(s): TSH, T4TOTAL, FREET4, T3FREE, THYROIDAB in the last 72 hours. Anemia Panel: No  results for input(s): VITAMINB12, FOLATE, FERRITIN, TIBC, IRON, RETICCTPCT in the last 72 hours. Sepsis Labs: No results for input(s): PROCALCITON, LATICACIDVEN in the last 168 hours.  Recent Results (from the past 240 hour(s))  SARS CORONAVIRUS 2 (TAT 6-24 HRS) Nasopharyngeal Nasopharyngeal Swab     Status: None   Collection Time: 05/28/19  1:45 PM   Specimen: Nasopharyngeal Swab  Result Value Ref Range Status   SARS Coronavirus 2 NEGATIVE NEGATIVE Final    Comment: (NOTE) SARS-CoV-2 target nucleic acids are NOT DETECTED. The SARS-CoV-2 RNA is generally detectable in upper and lower respiratory specimens during the acute phase of infection. Negative results do not preclude SARS-CoV-2 infection, do not rule out co-infections with other pathogens, and should not be used as the sole basis for treatment or other patient management decisions. Negative results must be combined with clinical observations, patient history, and epidemiological information. The expected result is Negative. Fact Sheet for Patients: SugarRoll.be Fact Sheet for Healthcare Providers: https://www.woods-mathews.com/ This test is  not yet approved or cleared by the [place] FDA and  has been authorized for detection and/or diagnosis of SARS-CoV-2 by FDA under an Emergency Use Authorization (EUA). This EUA will remain  in effect (meaning this test can be used) for the duration of the COVID-19 declaration under Section 56 4(b)(1) of the Act, 21 U.S.C. section 360bbb-3(b)(1), unless the authorization is terminated or revoked sooner. Performed at [facility], [place] [address] [place], [place] [address]          Radiology Studies: Ct Head Wo Contrast  Result Date: 05/28/2019 CLINICAL DATA:  Altered mental status 1 month. Incontinent since yesterday. EXAM: CT HEAD WITHOUT CONTRAST TECHNIQUE: Contiguous axial images were obtained from the base of the skull through the vertex without intravenous contrast. COMPARISON:  None. FINDINGS: Brain: Ventricles, cisterns and other CSF spaces are normal. There is minimal chronic ischemic microvascular disease. There is no mass, mass effect, shift of midline structures or acute hemorrhage. No evidence of acute infarction. Vascular: No hyperdense vessel or unexpected calcification. Skull: Normal. Negative for fracture or focal lesion. Sinuses/Orbits: No acute finding. Other: None. IMPRESSION: 1.  No acute findings. 2.  Minimal chronic ischemic microvascular disease. Electronically Signed   By: Marin Olp M.D.   On: 05/28/2019 14:51   US Abdomen Complete  Result Date: 05/29/2019 CLINICAL DATA:  Ascites due to alcoholic cirrhosis. EXAM: ABDOMEN ULTRASOUND COMPLETE COMPARISON:  01/14/2019 FINDINGS: Gallbladder: Status post cholecystectomy. Common bile duct: Diameter: 3.4 mm Liver: Cirrhotic liver appears shrunken with a nodular contour and heterogeneous echotexture. No focal liver abnormality. Portal vein is patent on color Doppler imaging with normal direction of blood flow towards the liver. IVC: No abnormality visualized. Pancreas: Visualized portion unremarkable. Spleen:  Size and appearance within normal limits. Right Kidney: Length: 10.3 cm. Echogenicity within normal limits. No mass or hydronephrosis visualized. Left Kidney: Length: 9.6 cm. Echogenicity within normal limits. No mass or hydronephrosis visualized. Abdominal aorta: No aneurysm visualized. Other findings: Moderate volume of ascites identified within all 4 quadrants of the abdomen. IMPRESSION: 1. Morphologic features of liver compatible with cirrhosis. 2. Moderate volume of ascites is identified in all 4 quadrants of the abdomen. Electronically Signed   By: Kerby Moors M.D.   On: 05/29/2019 11:17   Dg Chest Port 1 View  Result Date: 05/28/2019 CLINICAL DATA:  Altered mental status, weakness. EXAM: PORTABLE CHEST 1 VIEW COMPARISON:  07/14/2018. FINDINGS: Trachea is midline. Heart size normal. Lungs are clear. Left costophrenic angle was  not included on the image. No large pleural effusion. IMPRESSION: No acute findings. Electronically Signed   By: Lorin Picket M.D.   On: 05/28/2019 13:57        Scheduled Meds:  ferrous sulfate  325 mg Oral QODAY   midodrine  2.5 mg Oral TID WC   multivitamin with minerals  1 tablet Oral Daily   pantoprazole (PROTONIX) IV  40 mg Intravenous Q12H   psyllium  1 packet Oral QHS   senna  1 tablet Oral BID   vitamin C  250 mg Oral Daily   Continuous Infusions:  sodium chloride     sodium chloride     sodium chloride 50 mL/hr at 05/29/19 0200   albumin human     cefTRIAXone (ROCEPHIN)  IV 2 g (05/29/19 1057)     LOS: 0 days    Time spent: 33 minutes with over 50% of the time coordinating the patient's care    Harold Hedge, DO [facility] Pager [phone]  If 7PM-7AM, please contact night-coverage [email or web address] Password [facility] [place] 05/29/2019, 4:32 PM

## 2019-05-29 NOTE — H&P (View-Only) (Signed)
Consult Note for Billy [name]  Reason for Consult: Anemia and heme positive stool Referring Physician: [facility]  Quinn Josephine Igo HPI: This is a 73 year old male with a PMH of ETOH cirrhosis, DVT on anticoagulation since September, and esophageal varices s/p banding.  The patient was admitted for AMS and there was a report by his wife that he continues to drink.  Today he is alert and oriented.  His HGB was noted to be at 7.0 g/dL from a baseline of 11-12 g/dL.  He was started on Eliquis in September when he was noted to have a DVT of his right lower extremity.  He denies any issues with hematochezia, melena, or hematemesis.  Past Medical History:  Diagnosis Date  . Acute deep vein thrombosis (DVT) of femoral vein of right lower extremity ([place]) 03/30/2019  . Acute posthemorrhagic anemia   . Alcoholic cirrhosis of liver ([place]) 09/24/2011   Years of alcohol use. Progressed from fatty liver to cirrhosis based upon review imaging. Manifestations are portal hypertension with esophageal varices, portal gastropathy, rectal varices and he may have portal colopathy. Ascites also present. Naive to HAV and HBV Claims he has had pneumococcal vaccine   . Alcoholism ([place])   . Angiodysplasia of colon 09/11/2011   Multiple in the right colon, ablated with APC. Question if this is portal colopathy.   . Anxiety   . Back pain   . Basal cell carcinoma   . Bilateral varicoceles   . Campylobacter diarrhea 01/07/2012  . Cataract    removed  . DDD (degenerative disc disease), lumbar   . Depression    mild at most  . Diverticulosis of colon   . Erectile dysfunction   . Gastric ulcer 08/2011   small, antral ulcer  . GERD (gastroesophageal reflux disease)    takes Omeprazole prn  . [name] hemorrhage 2006, 2013   esophagitis and 1+ varices, melena in 2013   . Grade I diastolic dysfunction   . Hearing loss   . History of ascites   . History of blood transfusion    as a child and no abnormal reaction noted   . History of bronchitis    >64yrs ago  . History of dizziness   . Hydrocele   . Hypertension    takes Nadolol daily  . Narcotic dependence ([place])   . Personal history of colonic polyps   . Portal hypertension ([place]) 08/2011  . 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 Right hydrocele   . Scrotal edema   . Sleep disturbance    takes Remeron nightly  . Splenomegaly   . Thrombocytopenia, unspecified ([place])   . Varices, esophageal ([place]) 08/2011   Grade 2, mid-esophagus    Past Surgical History:  Procedure Laterality Date  . BIOPSY  08/27/2018   Procedure: BIOPSY;  Surgeon: Milus Banister, MD;  Location: [facility];  Service: Endoscopy;;  . CATARACT EXTRACTION  2013   bilateral with IOL  . CHOLECYSTECTOMY  12/31/2012   Procedure: LAPAROSCOPIC CHOLECYSTECTOMY;  Surgeon: Zenovia Jarred, MD;  Location: [facility];  Service: General;;  . COLONOSCOPY  2001, 200411/02/2006  . COLONOSCOPY  09/11/2011   Procedure: COLONOSCOPY;  Surgeon: Gatha Mayer, MD;  Location: [facility];  Service: Endoscopy;  Laterality: N/A;  . COLONOSCOPY    . ESOPHAGEAL BANDING N/A 05/02/2017   Procedure: ESOPHAGEAL BANDING;  Surgeon: Gatha Mayer, MD;  Location: [facility];  Service: Endoscopy;  Laterality: N/A;  . ESOPHAGEAL BANDING  06/09/2018   Procedure:  ESOPHAGEAL BANDING;  Surgeon: Gatha Mayer, MD;  Location: [facility];  Service: Endoscopy;;  . ESOPHAGEAL BANDING  08/27/2018   Procedure: ESOPHAGEAL BANDING;  Surgeon: Milus Banister, MD;  Location: [facility];  Service: Endoscopy;;  . ESOPHAGOGASTRODUODENOSCOPY  2006  . ESOPHAGOGASTRODUODENOSCOPY  09/11/2011   Procedure: ESOPHAGOGASTRODUODENOSCOPY (EGD);  Surgeon: Gatha Mayer, MD;  Location: [facility];  Service: Endoscopy;  Laterality: N/A;  . ESOPHAGOGASTRODUODENOSCOPY N/A 08/27/2018   Procedure: ESOPHAGOGASTRODUODENOSCOPY (EGD);  Surgeon: Milus Banister, MD;  Location: [facility];  Service: Endoscopy;  Laterality: N/A;  .  ESOPHAGOGASTRODUODENOSCOPY (EGD) WITH PROPOFOL N/A 04/13/2017   Procedure: ESOPHAGOGASTRODUODENOSCOPY (EGD) WITH PROPOFOL;  Surgeon: Doran Stabler, MD;  [facility];  Service: Gastroenterology;  Laterality: N/A;  . ESOPHAGOGASTRODUODENOSCOPY (EGD) WITH PROPOFOL N/A 05/02/2017   Procedure: ESOPHAGOGASTRODUODENOSCOPY (EGD) WITH PROPOFOL;  Surgeon: Gatha Mayer, MD;  [facility];  Service: Endoscopy;  Laterality: N/A;  . ESOPHAGOGASTRODUODENOSCOPY (EGD) WITH PROPOFOL N/A 09/30/2017   Procedure: ESOPHAGOGASTRODUODENOSCOPY (EGD) WITH PROPOFOL;  Surgeon: Gatha Mayer, MD;  [facility];  Service: Endoscopy;  Laterality: N/A;  . ESOPHAGOGASTRODUODENOSCOPY (EGD) WITH PROPOFOL N/A 06/09/2018   Procedure: ESOPHAGOGASTRODUODENOSCOPY (EGD) WITH PROPOFOL;  Surgeon: Gatha Mayer, MD;  [facility];  Service: Endoscopy;  Laterality: N/A;  . ESOPHAGOGASTRODUODENOSCOPY (EGD) WITH PROPOFOL N/A 01/05/2019   Procedure: ESOPHAGOGASTRODUODENOSCOPY (EGD) WITH PROPOFOL;  Surgeon: Gatha Mayer, MD;  Location[facility];  Service: Endoscopy;  Laterality: N/A;  . EUS N/A 08/27/2018   Procedure: UPPER ENDOSCOPIC ULTRASOUND (EUS) RADIAL;  Surgeon: Milus Banister, MD;  Location: [facility];  Service: Endoscopy;  Laterality: N/A;  . EYE SURGERY    . FLEXIBLE SIGMOIDOSCOPY  01/07/2012   Procedure: FLEXIBLE SIGMOIDOSCOPY;  Surgeon: Lafayette Dragon, MD;  Location: [facility];  Service: Endoscopy;  Laterality: N/A;  . IR RADIOLOGIST EVAL & MGMT  05/26/2019  . KNEE ARTHROSCOPY  2008   Right  . KNEE ARTHROSCOPY  2008   Left  . POLYPECTOMY    . [place] THORA/PARACENTESIS  10/28/2011  . Varicocele repair  90's    Family History  Problem Relation Age of Onset  . Stroke Mother   . Diabetes Mother   . Lung cancer Brother   . Prostate cancer Brother   . Liver cancer Cousin   . Liver cancer Paternal Uncle   . Colon cancer Neg Hx   . Stomach cancer Neg Hx   . Esophageal  cancer Neg Hx   . Colon polyps Neg Hx   . Rectal cancer Neg Hx   . Pancreatic cancer Neg Hx     Social History:  reports that he has been smoking cigarettes and e-cigarettes. He has been smoking about 0.50 packs per day. He has never used smokeless tobacco. He reports previous alcohol use. He reports current drug use. Drug: Marijuana.  Allergies:  Allergies  Allergen Reactions  . [place] Venom Anaphylaxis, Shortness Of Breath and Other (See Comments)    Throat closes up/eye swell shut  . Nsaids Other (See Comments)    Stomach pain, Bleeding risk    Medications:  Scheduled: . ferrous sulfate  325 mg Oral QODAY  . folic acid  1 mg Oral Daily  . midodrine  2.5 mg Oral TID WC  . multivitamin with minerals  1 tablet Oral Daily  . pantoprazole (PROTONIX) IV  40 mg Intravenous Q12H  . psyllium  1 packet Oral QHS  . senna  1 tablet Oral BID  .  thiamine  100 mg Oral Daily   Or  . thiamine  100 mg Intravenous Daily  . vitamin C  250 mg Oral Daily   Continuous: . sodium chloride    . sodium chloride    . sodium chloride 50 mL/hr at 05/29/19 0200  . albumin human    . cefTRIAXone (ROCEPHIN)  IV 2 g (05/29/19 1057)    Results for orders placed or performed during the hospital encounter of 05/28/19 (from the past 24 hour(s))  CBC     Status: Abnormal   Collection Time: 05/29/19  5:53 AM  Result Value Ref Range   WBC 9.7 4.0 - 10.5 K/uL   RBC 2.32 (L) 4.22 - 5.81 MIL/uL   Hemoglobin 7.0 (L) 13.0 - 17.0 g/dL   HCT 22.8 (L) 39.0 - 52.0 %   MCV 98.3 80.0 - 100.0 fL   MCH 30.2 26.0 - 34.0 pg   MCHC 30.7 30.0 - 36.0 g/dL   RDW 14.6 11.5 - 15.5 %   Platelets 188 150 - 400 K/uL   nRBC 0.0 0.0 - 0.2 %  Basic metabolic panel     Status: Abnormal   Collection Time: 05/29/19  5:53 AM  Result Value Ref Range   Sodium 133 (L) 135 - 145 mmol/L   Potassium 4.3 3.5 - 5.1 mmol/L   Chloride 100 98 - 111 mmol/L   CO2 23 22 - 32 mmol/L   Glucose, Bld 85 70 - 99 mg/dL   BUN 47 (H) 8 -  23 mg/dL   Creatinine, Ser 2.13 (H) 0.61 - 1.24 mg/dL   Calcium 8.4 (L) 8.9 - 10.3 mg/dL   GFR calc non Af Amer 30 (L) >60 mL/min   GFR calc Af Amer 35 (L) >60 mL/min   Anion gap 10 5 - 15  Type and screen [place]     Status: None (Preliminary result)   Collection Time: 05/29/19 10:52 AM  Result Value Ref Range   ABO/RH(D) O POS    Antibody Screen NEG    Sample Expiration 06/01/2019,2359    Unit Number [id number]    Blood Component Type RED CELLS,LR    Unit division 00    Status of Unit ISSUED    Transfusion Status OK TO TRANSFUSE    Crossmatch Result      Compatible Performed at [facility], [place] [address], [place], [place] [address]   Prepare RBC     Status: None   Collection Time: 05/29/19 10:52 AM  Result Value Ref Range   Order Confirmation      ORDER PROCESSED BY BLOOD BANK Performed at [facility], [place] [address] [place], [place] [address]      Ct Head Wo Contrast  Result Date: 05/28/2019 CLINICAL DATA:  Altered mental status 1 month. Incontinent since yesterday. EXAM: CT HEAD WITHOUT CONTRAST TECHNIQUE: Contiguous axial images were obtained from the base of the skull through the vertex without intravenous contrast. COMPARISON:  None. FINDINGS: Brain: Ventricles, cisterns and other CSF spaces are normal. There is minimal chronic ischemic microvascular disease. There is no mass, mass effect, shift of midline structures or acute hemorrhage. No evidence of acute infarction. Vascular: No hyperdense vessel or unexpected calcification. Skull: Normal. Negative for fracture or focal lesion. Sinuses/Orbits: No acute finding. Other: None. IMPRESSION: 1.  No acute findings. 2.  Minimal chronic ischemic microvascular disease. Electronically Signed   By: Marin Olp M.D.   On: 05/28/2019 14:51  US Abdomen Complete  Result Date: 05/29/2019 CLINICAL DATA:  Ascites due to alcoholic cirrhosis. EXAM: ABDOMEN ULTRASOUND  COMPLETE COMPARISON:  01/14/2019 FINDINGS: Gallbladder: Status post cholecystectomy. Common bile duct: Diameter: 3.4 mm Liver: Cirrhotic liver appears shrunken with a nodular contour and heterogeneous echotexture. No focal liver abnormality. Portal vein is patent on color Doppler imaging with normal direction of blood flow towards the liver. IVC: No abnormality visualized. Pancreas: Visualized portion unremarkable. Spleen: Size and appearance within normal limits. Right Kidney: Length: 10.3 cm. Echogenicity within normal limits. No mass or hydronephrosis visualized. Left Kidney: Length: 9.6 cm. Echogenicity within normal limits. No mass or hydronephrosis visualized. Abdominal aorta: No aneurysm visualized. Other findings: Moderate volume of ascites identified within all 4 quadrants of the abdomen. IMPRESSION: 1. Morphologic features of liver compatible with cirrhosis. 2. Moderate volume of ascites is identified in all 4 quadrants of the abdomen. Electronically Signed   By: Kerby Moors M.D.   On: 05/29/2019 11:17   Dg Chest Port 1 View  Result Date: 05/28/2019 CLINICAL DATA:  Altered mental status, weakness. EXAM: PORTABLE CHEST 1 VIEW COMPARISON:  07/14/2018. FINDINGS: Trachea is midline. Heart size normal. Lungs are clear. Left costophrenic angle was not included on the image. No large pleural effusion. IMPRESSION: No acute findings. Electronically Signed   By: Lorin Picket M.D.   On: 05/28/2019 13:57    ROS:  As stated above in the HPI otherwise negative.  Blood pressure 121/60, pulse 82, temperature 98.7 F (37.1 C), temperature source Oral, resp. rate 16, height 6\' 3"  (1.905 m), weight 75 kg, SpO2 100 %.    PE: Gen: NAD, Alert and Oriented x 3 HEENT:  [place]/AT, EOMI Neck: Supple, no LAD Lungs: CTA Bilaterally CV: RRR without M/G/R ABM: Soft, NTND, +BS Ext: No C/C/E, minimal axterixis.  Assessment/Plan: 1) Anemia. 2) Heme positive stool. 3) ETOH cirrhosis. 4) History of esophageal  varices. 5) Hepatic encephalopathy.   The drop in his HGB with Eliquis requires further evaluation.  He has a history of esophageal variceal bleeding and an EGD will be performed.  He should be switched over to Xifaxan or lactulose upon discharge.  Currently he is on CTX.  Plan; 1) EGD tomorrow.  Nature Vogelsang D 05/29/2019, 6:14 PM

## 2019-05-30 ENCOUNTER — Inpatient Hospital Stay (HOSPITAL_COMMUNITY): Payer: 59

## 2019-05-30 ENCOUNTER — Inpatient Hospital Stay (HOSPITAL_COMMUNITY): Payer: 59 | Admitting: Certified Registered"

## 2019-05-30 ENCOUNTER — Encounter (HOSPITAL_COMMUNITY)
Admission: EM | Disposition: A | Discharge status: Home / Self Care | Payer: Self-pay | Source: Home / Self Care | Attending: Internal Medicine

## 2019-05-30 HISTORY — PX: ESOPHAGOGASTRODUODENOSCOPY (EGD) WITH PROPOFOL: SHX5813

## 2019-05-30 LAB — COMPREHENSIVE METABOLIC PANEL
ALT: 16 U/L (ref 0–44)
AST: 24 U/L (ref 15–41)
Albumin: 2.2 g/dL — ABNORMAL LOW (ref 3.5–5.0)
Alkaline Phosphatase: 67 U/L (ref 38–126)
Anion gap: 7 (ref 5–15)
BUN: 38 mg/dL — ABNORMAL HIGH (ref 8–23)
CO2: 21 mmol/L — ABNORMAL LOW (ref 22–32)
Calcium: 7.6 mg/dL — ABNORMAL LOW (ref 8.9–10.3)
Chloride: 98 mmol/L (ref 98–111)
Creatinine, Ser: 1.61 mg/dL — ABNORMAL HIGH (ref 0.61–1.24)
GFR calc Af Amer: 49 mL/min — ABNORMAL LOW (ref >60)
GFR calc non Af Amer: 42 mL/min — ABNORMAL LOW (ref >60)
Glucose, Bld: 110 mg/dL — ABNORMAL HIGH (ref 70–99)
Potassium: 4 mmol/L (ref 3.5–5.1)
Sodium: 126 mmol/L — ABNORMAL LOW (ref 135–145)
Total Bilirubin: 0.7 mg/dL (ref 0.3–1.2)
Total Protein: 5.4 g/dL — ABNORMAL LOW (ref 6.5–8.1)

## 2019-05-30 LAB — PROTEIN, PLEURAL OR PERITONEAL FLUID: Total protein, fluid: <3 g/dL

## 2019-05-30 LAB — HEMOGLOBIN AND HEMATOCRIT, BLOOD
HCT: 28.8 % — ABNORMAL LOW (ref 39.0–52.0)
HCT: 27.3 % — ABNORMAL LOW (ref 39.0–52.0)
Hemoglobin: 8.7 g/dL — ABNORMAL LOW (ref 13.0–17.0)
Hemoglobin: 8.3 g/dL — ABNORMAL LOW (ref 13.0–17.0)

## 2019-05-30 LAB — TYPE AND SCREEN
ABO/RH(D): O POS
Antibody Screen: NEGATIVE
Unit division: 0

## 2019-05-30 LAB — LACTATE DEHYDROGENASE, PLEURAL OR PERITONEAL FLUID: LD, Fluid: 20 U/L (ref 3–23)

## 2019-05-30 LAB — BODY FLUID CELL COUNT WITH DIFFERENTIAL
Eos, Fluid: 0 %
Lymphs, Fluid: 14 %
Monocyte-Macrophage-Serous Fluid: 20 % — ABNORMAL LOW (ref 50–90)
Neutrophil Count, Fluid: 66 % — ABNORMAL HIGH (ref 0–25)
Total Nucleated Cell Count, Fluid: 139 cu mm (ref 0–1000)

## 2019-05-30 LAB — CBC
HCT: 25.3 % — ABNORMAL LOW (ref 39.0–52.0)
Hemoglobin: 7.8 g/dL — ABNORMAL LOW (ref 13.0–17.0)
MCH: 30.2 pg (ref 26.0–34.0)
MCHC: 30.8 g/dL (ref 30.0–36.0)
MCV: 98.1 fL (ref 80.0–100.0)
Platelets: 143 10*3/uL — ABNORMAL LOW (ref 150–400)
RBC: 2.58 MIL/uL — ABNORMAL LOW (ref 4.22–5.81)
RDW: 15.3 % (ref 11.5–15.5)
WBC: 6.3 10*3/uL (ref 4.0–10.5)
nRBC: 0 % (ref 0.0–0.2)

## 2019-05-30 LAB — MAGNESIUM: Magnesium: 2 mg/dL (ref 1.7–2.4)

## 2019-05-30 LAB — BPAM RBC
Blood Product Expiration Date: 202012112359
ISSUE DATE / TIME: 202011141305
Unit Type and Rh: 5100

## 2019-05-30 LAB — GLUCOSE, PLEURAL OR PERITONEAL FLUID: Glucose, Fluid: 105 mg/dL

## 2019-05-30 LAB — ALBUMIN, PLEURAL OR PERITONEAL FLUID: Albumin, Fluid: <1 g/dL

## 2019-05-30 SURGERY — ESOPHAGOGASTRODUODENOSCOPY (EGD) WITH PROPOFOL
Anesthesia: Monitor Anesthesia Care

## 2019-05-30 MED ORDER — LIDOCAINE HCL 1 % IJ SOLN
INTRAMUSCULAR | Status: AC
  Filled 2019-05-30: qty 10

## 2019-05-30 MED ORDER — PHENYLEPHRINE 40 MCG/ML (10ML) SYRINGE FOR IV PUSH (FOR BLOOD PRESSURE SUPPORT)
PREFILLED_SYRINGE | INTRAVENOUS | Status: DC | PRN
  Administered 2019-05-30: 80 ug via INTRAVENOUS

## 2019-05-30 MED ORDER — PROPOFOL 10 MG/ML IV BOLUS
INTRAVENOUS | Status: AC
  Filled 2019-05-30: qty 40

## 2019-05-30 MED ORDER — PROPOFOL 500 MG/50ML IV EMUL
INTRAVENOUS | Status: DC | PRN
  Administered 2019-05-30: 30 mg via INTRAVENOUS
  Administered 2019-05-30: 20 mg via INTRAVENOUS
  Administered 2019-05-30: 30 mg via INTRAVENOUS

## 2019-05-30 MED ORDER — PROPOFOL 500 MG/50ML IV EMUL
INTRAVENOUS | Status: DC | PRN
  Administered 2019-05-30: 125 ug/kg/min via INTRAVENOUS

## 2019-05-30 MED ORDER — PROPOFOL 500 MG/50ML IV EMUL
INTRAVENOUS | Status: AC
  Filled 2019-05-30: qty 50

## 2019-05-30 SURGICAL SUPPLY — 15 items

## 2019-05-30 NOTE — Plan of Care (Signed)
  Problem: Elimination: Goal: Will not experience complications related to bowel motility Outcome: Progressing   Problem: Elimination: Goal: Will not experience complications related to urinary retention Outcome: Progressing   Problem: Pain Managment: Goal: General experience of comfort will improve Outcome: Progressing   Problem: Activity: Goal: Risk for activity intolerance will decrease Outcome: Progressing

## 2019-05-30 NOTE — Anesthesia Preprocedure Evaluation (Addendum)
Anesthesia Evaluation  Patient identified by MRN, date of birth, ID band Patient awake    Reviewed: Allergy & Precautions, H&P , NPO status , Patient's Chart, lab work & pertinent test results  Airway Mallampati: II  TM Distance: >3 FB Neck ROM: Full    Dental no notable dental hx. (+) Teeth Intact, Dental Advisory Given   Pulmonary neg pulmonary ROS, Current Smoker and Patient abstained from smoking.,    Pulmonary exam normal breath sounds clear to auscultation       Cardiovascular hypertension, + DVT   Rhythm:Regular Rate:Normal     Neuro/Psych Anxiety Depression negative neurological ROS     GI/Hepatic PUD, GERD  Medicated,(+) Cirrhosis   Esophageal Varices    ,   Endo/Other  negative endocrine ROS  Renal/GU Renal InsufficiencyRenal disease  negative genitourinary   Musculoskeletal  (+) Arthritis , Osteoarthritis,    Abdominal   Peds  Hematology  (+) Blood dyscrasia, anemia ,   Anesthesia Other Findings   Reproductive/Obstetrics negative OB ROS                             Anesthesia Physical  Anesthesia Plan  ASA: III  Anesthesia Plan: MAC   Post-op Pain Management:    Induction: Intravenous  PONV Risk Score and Plan: 0 and Propofol infusion  Airway Management Planned: Nasal Cannula  Additional Equipment:   Intra-op Plan:   Post-operative Plan:   Informed Consent: I have reviewed the patients History and Physical, chart, labs and discussed the procedure including the risks, benefits and alternatives for the proposed anesthesia with the patient or authorized representative who has indicated his/her understanding and acceptance.     Dental advisory given  Plan Discussed with: CRNA  Anesthesia Plan Comments:         Anesthesia Quick Evaluation  

## 2019-05-30 NOTE — Interval H&P Note (Signed)
History and Physical Interval Note:  05/30/2019 12:28 PM  Billy [name]  has presented today for surgery, with the diagnosis of Anemia and heme positive stool.  The various methods of treatment have been discussed with the patient and family. After consideration of risks, benefits and other options for treatment, the patient has consented to  Procedure(s): ESOPHAGOGASTRODUODENOSCOPY (EGD) WITH PROPOFOL (N/A) as a surgical intervention.  The patient's history has been reviewed, patient examined, no change in status, stable for surgery.  I have reviewed the patient's chart and labs.  Questions were answered to the patient's satisfaction.     Woodson Macha D

## 2019-05-30 NOTE — Progress Notes (Signed)
PROGRESS NOTE    Billy [name]    Code Status: Full Code  [id number] DOB: [birth year] DOA: 05/28/2019  PCP: [name], MD    Hospital Summary  73 year old male with past medical history of alcoholic cirrhosis with portal hypertension, ascites, variceal bleeding being considered for TIPS, hypertension, chronic back pain on Norco, GERD, basal cell carcinoma, depression with recent diagnosis of DVT of right lower extremity in September 2020 started on Eliquis who presented to the ED from home via EMS for altered mental status for several weeks and acutely worsened over the past day prior to admission.  Patient noted to have stooled himself at home.  In the ED afebrile, heart rate in 60s, BP 121/97, comfortable on room air, sodium 133, potassium 4.7, BUN/creatinine 46/2.51 increased from 1 week ago, albumin 2.7, ammonia 33, WBC 11.1, hemoglobin 7.6, platelet 231.  CT head unremarkable for acute changes and chest x-ray normal.  Admitted for acute metabolic encephalopathy, AKI on CKD 3 and acute on chronic GI bleed in setting of Eliquis which was held on admission.  11/14: Started on Rocephin for SBP prophylaxis in cirrhotic for GI bleed, abdominal ultrasound ordered showing moderate ascites, paracentesis ordered with albumin (Cr> 1 at 2.13, BUN >30 at 47; total bili not elevated), GI consulted.  Encephalopathy noted to have resolved with hydration.  Transfuse 1 unit PRBCs for symptomatic anemia GI bleed with hemoglobin 7.0  A & P   Active Problems:   AMS (altered mental status)   GI bleed    Acute metabolic encephalopathy multifactorial factorial etiology: cirrhosis, AKI on CKD 3 and acute on chronic GI bleed in setting of Eliquis for DVT resolved with hydration.  Ammonia negative - Continue Rocephin for SBP prophylaxis in cirrhotic for GI bleed -He should be switched to Xifaxan or lactulose upon discharge per GI  Symptomatic anemia in setting of GI bleed while on Eliquis for DVT  status post 1 unit PRBCs hemoglobin 7.0->1u PRBC->>8.10>7.8 today.  -Follow up EGD today -Hold Eliquis -Protonix twice daily  AKI on CKD 3 creatinine 2.51 on admission, currently 1.61 with IV fluids, baseline 1.0-1.3.  Likely dehydration from diarrhea and decreased PO intake and anemia - Continue to monitor  Moderate volume ascites paracentesis with moderate ascites 11/14. S/p Paracentesis with labs on 11/15 with 3.2 L clear yellow fluid removed. Patient without abdominal pain, afebrile, no sign of SBP. Hypertensive - Hold Albumin for now  - Continue Rocephin prophylaxis as above  Alcoholic liver cirrhosis, Child Class C patient of Dr. Carlean Purl, GI.  Per recent notes, patient still drinking, patient reported his last drink was 3 weeks ago -Home Lasix, Aldactone and nadolol on hold -Continue midodrine -CIWA protocol - GI on board  Right femoral vein DVT on Eliquis -Hold Eliquis due to GI bleed.  Patient understands this puts him at risk of further clot burden  Cachexia likely from Cirrhosis -Dietary consult  DVT prophylaxis: SCDs Diet: N.p.o. Family Communication: Patient stated he would update his wife personally.  No family at bedside Disposition Plan: Made inpatient today, possible discharge in 1 to 2 days pending GI work-up  Consultants  GI  Procedures  11/14 1 unit PRBC transfusion 11/15 Paracentesis 11/15 EGD  Antibiotics  Rocephin 11/14 ->      Subjective   Patient examined laying flat and resting comfortably. Admits to being hungry as he has not eaten since Thursday due to NPO status. Denies any abdominal pain, nausea, vomiting, chest pain, shortness of breath. Remaining 10  point ROS negative   Objective   Vitals:   05/30/19 1250 05/30/19 1300 05/30/19 1310 05/30/19 1424  BP: (!) 145/85 (!) 145/76 (!) 154/60 (!) 144/72  Pulse: 87 81 87 96  Resp: 20 18 (!) 21 18  Temp:    97.7 F (36.5 C)  TempSrc:    Oral  SpO2: 100% 100% 99% 100%  Weight:        Height:        Intake/Output Summary (Last 24 hours) at 05/30/2019 1441 Last data filed at 05/30/2019 1400 Gross per 24 hour  Intake 1776.76 ml  Output 1 ml  Net 1775.76 ml   Filed Weights   05/28/19 2234 05/30/19 1211  Weight: 75 kg 75 kg    Examination:  Physical Exam Vitals signs and nursing note reviewed.  Constitutional:      Comments: cachectic  HENT:     Head:     Comments: Temporal wasting    Nose: Nose normal.  Neck:     Musculoskeletal: Normal range of motion.  Cardiovascular:     Rate and Rhythm: Normal rate and regular rhythm.  Pulmonary:     Effort: Pulmonary effort is normal.     Breath sounds: Normal breath sounds.  Abdominal:     General: There is distension.     Tenderness: There is no abdominal tenderness. There is no guarding.  Musculoskeletal: Normal range of motion.        General: No swelling.  Neurological:     Mental Status: He is alert. Mental status is at baseline.  Psychiatric:        Mood and Affect: Mood normal.        Behavior: Behavior normal.     Data Reviewed: I have personally reviewed following labs and imaging studies  CBC: Recent Labs  Lab 05/28/19 1250 05/29/19 0553 05/29/19 1823 05/29/19 2219 05/30/19 0524  WBC 11.1* 9.7  --   --  6.3  NEUTROABS 9.1*  --   --   --   --   HGB 7.6* 7.0* 7.7* 8.1* 7.8*  HCT 25.1* 22.8* 25.5* 26.4* 25.3*  MCV 98.8 98.3  --   --  98.1  PLT 231 188  --   --  [id number]*   Basic Metabolic Panel: Recent Labs  Lab 05/28/19 1250 05/29/19 0553 05/29/19 1823 05/30/19 0524  NA 133* 133* 132* 126*  K 4.7 4.3 4.1 4.0  CL 98 100 99 98  CO2 24 23 23  21*  GLUCOSE 106* 85 112* 110*  BUN 46* 47* 42* 38*  CREATININE 2.51* 2.13* 1.88* 1.61*  CALCIUM 8.4* 8.4* 8.1* 7.6*  MG  --   --  1.9 2.0  PHOS  --   --  3.8  --    GFR: Estimated Creatinine Clearance: 44.6 mL/min (A) (by C-G formula based on SCr of 1.61 mg/dL (H)). Liver Function Tests: Recent Labs  Lab 05/28/19 1250 05/29/19 1823  05/30/19 0524  AST 27 27 24   ALT 20 18 16   ALKPHOS 73 70 67  BILITOT 1.1 1.1 0.7  PROT 6.4* 5.6* 5.4*  ALBUMIN 2.7* 2.2* 2.2*   No results for input(s): LIPASE, AMYLASE in the last 168 hours. Recent Labs  Lab 05/28/19 1242  AMMONIA 33   Coagulation Profile: Recent Labs  Lab 05/29/19 1823  INR 1.6*   Cardiac Enzymes: No results for input(s): CKTOTAL, CKMB, CKMBINDEX, TROPONINI in the last 168 hours. BNP (last 3 results) No results for input(s): PROBNP in the last  8760 hours. HbA1C: No results for input(s): HGBA1C in the last 72 hours. CBG: No results for input(s): GLUCAP in the last 168 hours. Lipid Profile: No results for input(s): CHOL, HDL, LDLCALC, TRIG, CHOLHDL, LDLDIRECT in the last 72 hours. Thyroid Function Tests: No results for input(s): TSH, T4TOTAL, FREET4, T3FREE, THYROIDAB in the last 72 hours. Anemia Panel: No results for input(s): VITAMINB12, FOLATE, FERRITIN, TIBC, IRON, RETICCTPCT in the last 72 hours. Sepsis Labs: No results for input(s): PROCALCITON, LATICACIDVEN in the last 168 hours.  Recent Results (from the past 240 hour(s))  SARS CORONAVIRUS 2 (TAT 6-24 HRS) Nasopharyngeal Nasopharyngeal Swab     Status: None   Collection Time: 05/28/19  1:45 PM   Specimen: Nasopharyngeal Swab  Result Value Ref Range Status   SARS Coronavirus 2 NEGATIVE NEGATIVE Final    Comment: (NOTE) SARS-CoV-2 target nucleic acids are NOT DETECTED. The SARS-CoV-2 RNA is generally detectable in upper and lower respiratory specimens during the acute phase of infection. Negative results do not preclude SARS-CoV-2 infection, do not rule out co-infections with other pathogens, and should not be used as the sole basis for treatment or other patient management decisions. Negative results must be combined with clinical observations, patient history, and epidemiological information. The expected result is Negative. Fact Sheet for  Patients: SugarRoll.be Fact Sheet for Healthcare Providers: https://www.woods-mathews.com/ This test is not yet approved or cleared by the [place] FDA and  has been authorized for detection and/or diagnosis of SARS-CoV-2 by FDA under an Emergency Use Authorization (EUA). This EUA will remain  in effect (meaning this test can be used) for the duration of the COVID-19 declaration under Section 56 4(b)(1) of the Act, 21 U.S.C. section 360bbb-3(b)(1), unless the authorization is terminated or revoked sooner. Performed at [facility], [place] [address], [place], [place] [address]          Radiology Studies: Ct Head Wo Contrast  Result Date: 05/28/2019 CLINICAL DATA:  Altered mental status 1 month. Incontinent since yesterday. EXAM: CT HEAD WITHOUT CONTRAST TECHNIQUE: Contiguous axial images were obtained from the base of the skull through the vertex without intravenous contrast. COMPARISON:  None. FINDINGS: Brain: Ventricles, cisterns and other CSF spaces are normal. There is minimal chronic ischemic microvascular disease. There is no mass, mass effect, shift of midline structures or acute hemorrhage. No evidence of acute infarction. Vascular: No hyperdense vessel or unexpected calcification. Skull: Normal. Negative for fracture or focal lesion. Sinuses/Orbits: No acute finding. Other: None. IMPRESSION: 1.  No acute findings. 2.  Minimal chronic ischemic microvascular disease. Electronically Signed   By: Marin Olp M.D.   On: 05/28/2019 14:51   US Abdomen Complete  Result Date: 05/29/2019 CLINICAL DATA:  Ascites due to alcoholic cirrhosis. EXAM: ABDOMEN ULTRASOUND COMPLETE COMPARISON:  01/14/2019 FINDINGS: Gallbladder: Status post cholecystectomy. Common bile duct: Diameter: 3.4 mm Liver: Cirrhotic liver appears shrunken with a nodular contour and heterogeneous echotexture. No focal liver abnormality. Portal vein is patent on color Doppler  imaging with normal direction of blood flow towards the liver. IVC: No abnormality visualized. Pancreas: Visualized portion unremarkable. Spleen: Size and appearance within normal limits. Right Kidney: Length: 10.3 cm. Echogenicity within normal limits. No mass or hydronephrosis visualized. Left Kidney: Length: 9.6 cm. Echogenicity within normal limits. No mass or hydronephrosis visualized. Abdominal aorta: No aneurysm visualized. Other findings: Moderate volume of ascites identified within all 4 quadrants of the abdomen. IMPRESSION: 1. Morphologic features of liver compatible with cirrhosis. 2. Moderate volume of ascites is identified  in all 4 quadrants of the abdomen. Electronically Signed   By: Kerby Moors M.D.   On: 05/29/2019 11:17   US Paracentesis  Result Date: 05/30/2019 INDICATION: Patient with history alcoholic cirrhosis, esophageal varices s/p banding, abdominal distension, and ascites. Request is made for diagnostic and therapeutic paracentesis. EXAM: ULTRASOUND GUIDED DIAGNOSTIC AND THERAPEUTIC PARACENTESIS MEDICATIONS: 15 mL 1% lidocaine COMPLICATIONS: None immediate. PROCEDURE: Informed written consent was obtained from the patient after a discussion of the risks, benefits and alternatives to treatment. A timeout was performed prior to the initiation of the procedure. Initial ultrasound scanning demonstrates a moderate amount of ascites within the left lower abdominal quadrant. The left lower abdomen was prepped and draped in the usual sterile fashion. 1% lidocaine was used for local anesthesia. Following this, a 19 gauge, 7-cm, Yueh catheter was introduced. An ultrasound image was saved for documentation purposes. The paracentesis was performed. The catheter was removed and a dressing was applied. The patient tolerated the procedure well without immediate post procedural complication. Patient received post-procedure intravenous albumin; see nursing notes for details. FINDINGS: A total of  approximately 3.2 L of clear yellow fluid was removed. Samples were sent to the laboratory as requested by the clinical team. IMPRESSION: Successful ultrasound-guided paracentesis yielding 3.2 L of peritoneal fluid. Read by: Earley Abide, PA-C Electronically Signed   By: Sandi Mariscal M.D.   On: 05/30/2019 12:13        Scheduled Meds:  ferrous sulfate  325 mg Oral QODAY   folic acid  1 mg Oral Daily   lidocaine       lidocaine       midodrine  2.5 mg Oral TID WC   multivitamin with minerals  1 tablet Oral Daily   pantoprazole (PROTONIX) IV  40 mg Intravenous Q12H   psyllium  1 packet Oral QHS   senna  1 tablet Oral BID   thiamine  100 mg Oral Daily   Or   thiamine  100 mg Intravenous Daily   vitamin C  250 mg Oral Daily   Continuous Infusions:  sodium chloride     sodium chloride     sodium chloride 50 mL/hr at 05/30/19 1037   cefTRIAXone (ROCEPHIN)  IV 2 g (05/30/19 1038)     LOS: 1 day    Time spent: 25 minutes with over 50% of the time coordinating the patient's care    Harold Hedge, DO [facility] Pager [phone]  If 7PM-7AM, please contact night-coverage [email or web address] Password [email or web address] 05/30/2019, 2:41 PM

## 2019-05-30 NOTE — Progress Notes (Signed)
05/30/2019  1430  Page MD to see if patient still need to have Albumin IV. Per MD hold Albumin for now.

## 2019-05-30 NOTE — Transfer of Care (Signed)
Immediate Anesthesia Transfer of Care Note  Patient: Billy [name]  Procedure(s) Performed: ESOPHAGOGASTRODUODENOSCOPY (EGD) WITH PROPOFOL (N/A )  Patient Location: Endoscopy Unit  Anesthesia Type:MAC  Level of Consciousness: awake, oriented and patient cooperative  Airway & Oxygen Therapy: Patient Spontanous Breathing and Patient connected to face mask oxygen  Post-op Assessment: Report given to RN and Post -op Vital signs reviewed and stable  Post vital signs: Reviewed and stable  Last Vitals:  Vitals Value Taken Time  BP    Temp    Pulse 93 05/30/19 1249  Resp 16 05/30/19 1249  SpO2 100 % 05/30/19 1249  Vitals shown include unvalidated device data.  Last Pain:  Vitals:   05/30/19 1211  TempSrc: Temporal  PainSc: 0-No pain      Patients Stated Pain Goal: 5 (92/44/62 8638)  Complications: No apparent anesthesia complications

## 2019-05-30 NOTE — Anesthesia Postprocedure Evaluation (Signed)
Anesthesia Post Note  Patient: Billy [name]  Procedure(s) Performed: ESOPHAGOGASTRODUODENOSCOPY (EGD) WITH PROPOFOL (N/A )     Patient location during evaluation: Endoscopy Anesthesia Type: MAC Level of consciousness: awake and alert Pain management: pain level controlled Vital Signs Assessment: post-procedure vital signs reviewed and stable Respiratory status: spontaneous breathing, nonlabored ventilation and respiratory function stable Cardiovascular status: stable and blood pressure returned to baseline Postop Assessment: no apparent nausea or vomiting Anesthetic complications: no    Last Vitals:  Vitals:   05/30/19 1310 05/30/19 1424  BP: (!) 154/60 (!) 144/72  Pulse: 87 96  Resp: (!) 21 18  Temp:  36.5 C  SpO2: 99% 100%    Last Pain:  Vitals:   05/30/19 1533  TempSrc:   PainSc: 9                  Corky Blumstein,W. EDMOND

## 2019-05-30 NOTE — Procedures (Signed)
PROCEDURE SUMMARY:  Successful image-guided paracentesis from the left lateral abdomen.  Yielded 3.2 liters of clear yellow fluid.  No immediate complications.  EBL = 0 mL. Patient tolerated well.   Specimen was sent for labs.  Please see imaging section of Epic for full dictation.   Claris Pong Louk PA-C 05/30/2019 11:51 AM

## 2019-05-30 NOTE — Op Note (Signed)
[facility] Patient Name: Billy [name] Procedure Date: 05/30/2019 MRN: [id number] Attending MD: Carol Ada , MD Date of Birth: 05/10/1946 CSN: [id number] Age: 73 Admit Type: Inpatient Procedure:                Upper GI endoscopy Indications:              Iron deficiency anemia, Follow-up of esophageal                            varices Providers:                Carol Ada, MD, Glori Bickers, RN, Laverda Sorenson,                            Technician, Dayle Points, CRNA Referring MD:              Medicines:                Propofol per Anesthesia Complications:            No immediate complications. Estimated Blood Loss:     Estimated blood loss: none. Procedure:                Pre-Anesthesia Assessment:                           - Prior to the procedure, a History and Physical                            was performed, and patient medications and                            allergies were reviewed. The patient's tolerance of                            previous anesthesia was also reviewed. The risks                            and benefits of the procedure and the sedation                            options and risks were discussed with the patient.                            All questions were answered, and informed consent                            was obtained. Prior Anticoagulants: The patient has                            taken Eliquis (apixaban), last dose was 2 days                            prior to procedure. ASA Grade Assessment: III - A  patient with severe systemic disease. After                            reviewing the risks and benefits, the patient was                            deemed in satisfactory condition to undergo the                            procedure.                           - Sedation was administered by an anesthesia                            professional. Deep sedation was attained.   After obtaining informed consent, the endoscope was                            passed under direct vision. Throughout the                            procedure, the patient's blood pressure, pulse, and                            oxygen saturations were monitored continuously. The                            [id number] Olympus gastroscope was                            introduced through the mouth, and advanced to the                            second part of duodenum. The upper GI endoscopy was                            accomplished without difficulty. The patient                            tolerated the procedure well. Scope In: Scope Out: Findings:      LA Grade D (one or more mucosal breaks involving at least 75% of       esophageal circumference) esophagitis with no bleeding was found in the       lower third of the esophagus.      Large (> 5 mm) varices were found in the middle third of the esophagus       and in the lower third of the esophagus.      Mild portal hypertensive gastropathy was found in the gastric fundus and       in the gastric body.      A few diminutive angiodysplastic lesions without bleeding were found in       the second portion of the duodenum.      Esophagitis was noted in the mid to lower esophagus, consistent with a  LA Grade D esophagitis. The esophageal varices, compared to 12/2018, have       increased in size from small to large. There was evidence that bleeding       from the esophagitis was from the esophagus as some hematin was noted in       the proximal stomach. There was no evidence of any fundic varices. A       mild and nonspecific duodenitis was noted in the bulb and several small       nonbleeding AVMs were found in the second portion of the duodenum. These       AVMs were not felt to be the source of bleeding. Impression:               - LA Grade D reflux esophagitis with no bleeding.                           - Large (> 5 mm)  esophageal varices.                           - Portal hypertensive gastropathy.                           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                           - No specimens collected. Moderate Sedation:      Not Applicable - Patient had care per Anesthesia. Recommendation:           - Return patient to hospital ward for ongoing care.                           - Low sodium diet.                           - Continue present medications.                           - Repeat EGD for potential banding, per Dr. Carlean Purl                            in the future.                           - Okay to restart Eliquis.                           - Maintain PPI BID x 1 month and then QD                            indefinitely                           - Follow HGB and transfuse if required.                           -  [facility] to assume care in the AM. Procedure Code(s):        --- Professional ---                           [id number], Esophagogastroduodenoscopy, flexible,                            transoral; diagnostic, including collection of                            specimen(s) by brushing or washing, when performed                            (separate procedure) Diagnosis Code(s):        --- Professional ---                           K21.00, Gastro-esophageal reflux disease with                            esophagitis, without bleeding                           I85.00, Esophageal varices without bleeding                           K76.6, Portal hypertension                           K31.89, Other diseases of stomach and duodenum                           K31.819, Angiodysplasia of stomach and duodenum                            without bleeding                           D50.9, Iron deficiency anemia, unspecified CPT copyright 2019 [employer]. All rights reserved. The codes documented in this report are preliminary and  upon coder review may  be revised to meet current compliance requirements. Carol Ada, MD Carol Ada, MD 05/30/2019 12:51:15 PM This report has been signed electronically. Number of Addenda: 0

## 2019-05-31 ENCOUNTER — Telehealth: Payer: Self-pay | Admitting: Internal Medicine

## 2019-05-31 ENCOUNTER — Inpatient Hospital Stay (HOSPITAL_COMMUNITY): Payer: 59

## 2019-05-31 ENCOUNTER — Encounter (HOSPITAL_COMMUNITY): Payer: Self-pay | Admitting: Gastroenterology

## 2019-05-31 DIAGNOSIS — K7290 Hepatic failure, unspecified without coma: Secondary | ICD-10-CM

## 2019-05-31 DIAGNOSIS — K7031 Alcoholic cirrhosis of liver with ascites: Secondary | ICD-10-CM

## 2019-05-31 DIAGNOSIS — I825Z1 Chronic embolism and thrombosis of unspecified deep veins of right distal lower extremity: Secondary | ICD-10-CM

## 2019-05-31 DIAGNOSIS — Z7901 Long term (current) use of anticoagulants: Secondary | ICD-10-CM

## 2019-05-31 DIAGNOSIS — K2101 Gastro-esophageal reflux disease with esophagitis, with bleeding: Principal | ICD-10-CM

## 2019-05-31 DIAGNOSIS — Z86718 Personal history of other venous thrombosis and embolism: Secondary | ICD-10-CM

## 2019-05-31 DIAGNOSIS — K274 Chronic or unspecified peptic ulcer, site unspecified, with hemorrhage: Secondary | ICD-10-CM

## 2019-05-31 DIAGNOSIS — I8510 Secondary esophageal varices without bleeding: Secondary | ICD-10-CM

## 2019-05-31 LAB — COMPREHENSIVE METABOLIC PANEL
ALT: 17 U/L (ref 0–44)
AST: 26 U/L (ref 15–41)
Albumin: 2 g/dL — ABNORMAL LOW (ref 3.5–5.0)
Alkaline Phosphatase: 64 U/L (ref 38–126)
Anion gap: 7 (ref 5–15)
BUN: 31 mg/dL — ABNORMAL HIGH (ref 8–23)
CO2: 21 mmol/L — ABNORMAL LOW (ref 22–32)
Calcium: 7.6 mg/dL — ABNORMAL LOW (ref 8.9–10.3)
Chloride: 104 mmol/L (ref 98–111)
Creatinine, Ser: 1.59 mg/dL — ABNORMAL HIGH (ref 0.61–1.24)
GFR calc Af Amer: 50 mL/min — ABNORMAL LOW (ref >60)
GFR calc non Af Amer: 43 mL/min — ABNORMAL LOW (ref >60)
Glucose, Bld: 127 mg/dL — ABNORMAL HIGH (ref 70–99)
Potassium: 4.2 mmol/L (ref 3.5–5.1)
Sodium: 132 mmol/L — ABNORMAL LOW (ref 135–145)
Total Bilirubin: 0.4 mg/dL (ref 0.3–1.2)
Total Protein: 5.2 g/dL — ABNORMAL LOW (ref 6.5–8.1)

## 2019-05-31 LAB — CBC
HCT: 24.7 % — ABNORMAL LOW (ref 39.0–52.0)
Hemoglobin: 7.5 g/dL — ABNORMAL LOW (ref 13.0–17.0)
MCH: 30.1 pg (ref 26.0–34.0)
MCHC: 30.4 g/dL (ref 30.0–36.0)
MCV: 99.2 fL (ref 80.0–100.0)
Platelets: 131 10*3/uL — ABNORMAL LOW (ref 150–400)
RBC: 2.49 MIL/uL — ABNORMAL LOW (ref 4.22–5.81)
RDW: 14.7 % (ref 11.5–15.5)
WBC: 6.2 10*3/uL (ref 4.0–10.5)
nRBC: 0 % (ref 0.0–0.2)

## 2019-05-31 LAB — GRAM STAIN

## 2019-05-31 MED ORDER — SUCRALFATE 1 GM/10ML PO SUSP
1.0000 g | Freq: Three times a day (TID) | ORAL | Status: DC
  Administered 2019-05-31 (×2): 1 g via ORAL
  Filled 2019-05-31 (×2): qty 10

## 2019-05-31 MED ORDER — SODIUM CHLORIDE 0.9 % IV SOLN
510.0000 mg | Freq: Once | INTRAVENOUS | Status: AC
  Administered 2019-05-31: 510 mg via INTRAVENOUS
  Filled 2019-05-31: qty 17

## 2019-05-31 MED ORDER — LACTULOSE 10 GM/15ML PO SOLN: 30.0000 g | Freq: Two times a day (BID) | ORAL | 0 refills | Status: DC

## 2019-05-31 MED ORDER — PANTOPRAZOLE SODIUM 40 MG PO TBEC: DELAYED_RELEASE_TABLET | ORAL | 0 refills | Status: DC

## 2019-05-31 MED ORDER — SUCRALFATE 1 GM/10ML PO SUSP: 1.0000 g | Freq: Three times a day (TID) | ORAL | 0 refills | Status: DC

## 2019-05-31 NOTE — Progress Notes (Addendum)
Patient ID: Billy [name], male   DOB: 1946/02/04, 73 y.o.   MRN: [id number]    Progress Note   Subjective   day # 2 CC;Cirrhosis, AMS, melena in setting of Eliquis   EGD yesterday - large varices , non bleeding and severe Grade D esophagitis involving 75 % of esophageal circumference, mild portal gastropathy  HGb 7.5 down from 8.7 yesterday   Creat improved 1.59  Peritoneal fluid - gram stain no organisms,alb less than 1, cell counts  66 neutro, cultures P   acute DVT RLE - dx 9/20 /20  Patient says he is feeling good, and wants to go home today.  He is very worried about his health insurance coverage. Eating without difficulty, denies any dysphagia or odynophagia.  No complaints of abdominal pain and abdomen feels softer post paracentesis yesterday. Bowel movement yesterday brown.   Objective   Vital signs in last 24 hours: Temp:  [97.7 F (36.5 C)-98.8 F (37.1 C)] 98.7 F (37.1 C) (11/16 0533) Pulse Rate:  [81-96] 85 (11/16 0533) Resp:  [16-21] 20 (11/16 0533) BP: (104-157)/(51-91) 104/52 (11/16 0533) SpO2:  [98 %-100 %] 100 % (11/16 0533) Weight:  [75 kg] 75 kg (11/15 1211) Last BM Date: 05/30/19 General: Older white male  in NAD, thin.  Mentating well Heart:  Regular rate and rhythm; no murmurs Lungs: Respirations even and unlabored, lungs CTA bilaterally Abdomen:  Soft, nontender and nondistended, nontense ascites. Normal bowel sounds. Extremities:  Without edema. Neurologic:  Alert and oriented,  grossly normal neurologically, no asterixis Psych:  Cooperative. Normal mood and affect.  Intake/Output from previous day: 11/15 0701 - 11/16 0700 In: [id number] [P.O.:480; I.V.:1144; IV Piggyback:100] Out: 0  Intake/Output this shift: No intake/output data recorded.  Lab Results: Recent Labs    05/29/19 0553  05/30/19 0524 05/30/19 1533 05/30/19 2117 05/31/19 0453  WBC 9.7  --  6.3  --   --  6.2  HGB 7.0*   < > 7.8* 8.7* 8.3* 7.5*  HCT 22.8*   < > 25.3* 28.8*  27.3* 24.7*  PLT 188  --  143*  --   --  131*   < > = values in this interval not displayed.   BMET Recent Labs    05/29/19 1823 05/30/19 0524 05/31/19 0453  NA 132* 126* 132*  K 4.1 4.0 4.2  CL 99 98 104  CO2 23 21* 21*  GLUCOSE 112* 110* 127*  BUN 42* 38* 31*  CREATININE 1.88* 1.61* 1.59*  CALCIUM 8.1* 7.6* 7.6*   LFT Recent Labs    05/31/19 0453  PROT 5.2*  ALBUMIN 2.0*  AST 26  ALT 17  ALKPHOS 64  BILITOT 0.4   PT/INR Recent Labs    05/29/19 1823  LABPROT 18.4*  INR 1.6*    Studies/Results: US Abdomen Complete  Result Date: 05/29/2019 CLINICAL DATA:  Ascites due to alcoholic cirrhosis. EXAM: ABDOMEN ULTRASOUND COMPLETE COMPARISON:  01/14/2019 FINDINGS: Gallbladder: Status post cholecystectomy. Common bile duct: Diameter: 3.4 mm Liver: Cirrhotic liver appears shrunken with a nodular contour and heterogeneous echotexture. No focal liver abnormality. Portal vein is patent on color Doppler imaging with normal direction of blood flow towards the liver. IVC: No abnormality visualized. Pancreas: Visualized portion unremarkable. Spleen: Size and appearance within normal limits. Right Kidney: Length: 10.3 cm. Echogenicity within normal limits. No mass or hydronephrosis visualized. Left Kidney: Length: 9.6 cm. Echogenicity within normal limits. No mass or hydronephrosis visualized. Abdominal aorta: No aneurysm visualized. Other findings: Moderate volume of  ascites identified within all 4 quadrants of the abdomen. IMPRESSION: 1. Morphologic features of liver compatible with cirrhosis. 2. Moderate volume of ascites is identified in all 4 quadrants of the abdomen. Electronically Signed   By: Kerby Moors M.D.   On: 05/29/2019 11:17   US Paracentesis  Result Date: 05/30/2019 INDICATION: Patient with history alcoholic cirrhosis, esophageal varices s/p banding, abdominal distension, and ascites. Request is made for diagnostic and therapeutic paracentesis. EXAM: ULTRASOUND GUIDED  DIAGNOSTIC AND THERAPEUTIC PARACENTESIS MEDICATIONS: 15 mL 1% lidocaine COMPLICATIONS: None immediate. PROCEDURE: Informed written consent was obtained from the patient after a discussion of the risks, benefits and alternatives to treatment. A timeout was performed prior to the initiation of the procedure. Initial ultrasound scanning demonstrates a moderate amount of ascites within the left lower abdominal quadrant. The left lower abdomen was prepped and draped in the usual sterile fashion. 1% lidocaine was used for local anesthesia. Following this, a 19 gauge, 7-cm, Yueh catheter was introduced. An ultrasound image was saved for documentation purposes. The paracentesis was performed. The catheter was removed and a dressing was applied. The patient tolerated the procedure well without immediate post procedural complication. Patient received post-procedure intravenous albumin; see nursing notes for details. FINDINGS: A total of approximately 3.2 L of clear yellow fluid was removed. Samples were sent to the laboratory as requested by the clinical team. IMPRESSION: Successful ultrasound-guided paracentesis yielding 3.2 L of peritoneal fluid. Read by: Earley Abide, PA-C Electronically Signed   By: Sandi Mariscal M.D.   On: 05/30/2019 12:13       Assessment / Plan:    #28 73 year old white male with EtOH induced cirrhosis/decompensated with ascites and large esophageal varices.  Apparently continues to drink. Admitted 05/28/2019 with severe anemia, and setting of Eliquis use over the past couple of months for right lower extremity DVT. Patient had denied melena.  EGD showed severe grade D esophagitis felt to be source of blood loss and large esophageal varices without stigmata of recent bleeding.  Patient had been on omeprazole 40 mg p.o. twice daily at home, by his report  No active bleeding, hemoglobin down from yesterday but overall stable  #2 iron deficiency-was to have iron infusions as an outpatient.   We will give him Feraheme x1 today, then can arrange another infusion outpatient in a week or so.  He had 1 unit of packed RBC since admission #3 altered mental status-multifactoral-resolved with hydration  #4 right lower extremity DVT diagnosed September 2020-will reassess with Doppler today to determine need for resuming Eliquis  #5 ascites, persistent despite diuretics-patient underwent IR consultation on 05/26/2019 for TIPS which is actually scheduled for next week with Dr. Reesa Chew. #6 acute kidney injury-diuretics on hold, midodrine started, creatinine improving  Plan; twice daily PPI on discharge Carafate 1 g 4 times daily between meals and at bedtime/suspension x1 month We will give Feraheme infusion today Doppler ultrasound right lower extremity today Diuretics currently on hold-creatinine improved, will need to resume probably at lower dose on discharge. 2 grams sodium diet   Patient is scheduled for TIPS next week.      Active Problems:   AMS (altered mental status)   GI bleed     LOS: 2 days   Amy Esterwood PA-C 05/31/2019, 8:53 AM   I have discussed the case with the PA, and that is the plan I formulated.   I received a call from Dr. Neysa Bonito this afternoon as the patient is apparently quite anxious to be discharged, and  there was some concerns about clarifying his medications.  As I would not be able to get over to [place] long until later this evening, and apparently patient did not want to wait that long, the following medication advice to Dr. Neysa Bonito:  Resume oral anticoagulation, as patient still has a DVT His daily oral Protonix 40 mg Lactulose 30 g twice daily Do not resume diuretics right now.  His renal function has returned to baseline from the AKI he had on admission, and it sounds like he may yet be going for a TIPS next week.  Regarding that, I will message his primary GI doctor, my partner Dr. Silvano Rusk, so he can review the events of hospitalization and  see if he still feels TIPS is appropriate in the near future.  More than anything, this patient absolutely needs to stop drinking.    Nelida Meuse III Office: [phone]

## 2019-05-31 NOTE — Telephone Encounter (Signed)
Patient discharged today from hospital.  He had Feraheme x 1 in patient.  He was to get Infed as an outpatient x 2.  If you want him to have the 2nd Infed outpatient he will need a letter of clearance from you stating ok to infuse.  Does he need Infed x 1 or please advise next step

## 2019-05-31 NOTE — Telephone Encounter (Signed)
I left a message for Billy [name] to please call back with more specifics on what they are needing.  Patient notified me he had first infusion today

## 2019-05-31 NOTE — Progress Notes (Signed)
Right lower extremity venous duplex has been completed. Preliminary results can be found in CV Proc through chart review.  Results were given to the patient's nurse, Helene Kelp.  05/31/19 11:26 AM Carlos Levering RVT

## 2019-05-31 NOTE — Final Progress Note (Signed)
Patient given discharge instructions and verbalizes understanding. Patient with no complaints of pain. Will discharge via [place].

## 2019-05-31 NOTE — Discharge Summary (Signed)
Physician Discharge Summary  Billy [name] [id number] DOB: Dec 08, 1945 DOA: 05/28/2019  PCP: [name], MD  Admit date: 05/28/2019 Discharge date: 05/31/2019   Code Status: Prior  Admitted From: Home Discharged to: [place]: No Equipment/Devices: No Discharge Condition: Stable  Recommendations for Outpatient Follow-up   1. Follow up with PCP in 1 week 2. Please follow up CMP/CBC  3. Follow up with GI 4. Monitor for bleed as he is restarting Eliquis 5. Second dose of IV iron outpatient  Hospital Summary  73 year old male with past medical history of alcoholic cirrhosis with portal hypertension, ascites, variceal bleeding being considered for TIPS, hypertension, chronic back pain on Norco, GERD, basal cell carcinoma, depression with recent diagnosis of DVT of right lower extremity in September 2020 started on Eliquis who presented to the ED from home via EMS for altered mental status for several weeks and acutely worsened over the past day prior to admission.  Patient noted to have stooled himself at home.  In the ED afebrile, heart rate in 60s, BP 121/97, comfortable on room air, sodium 133, potassium 4.7, BUN/creatinine 46/2.51 increased from 1 week ago, albumin 2.7, ammonia 33, WBC 11.1, hemoglobin 7.6, platelet 231.  CT head unremarkable for acute changes and chest x-ray normal.  Admitted for acute metabolic encephalopathy, AKI on CKD 3 and acute on chronic GI bleed in setting of Eliquis which was held on admission.  11/14: Started on Rocephin for SBP prophylaxis in cirrhotic for GI bleed, abdominal ultrasound ordered showing moderate ascites, paracentesis ordered with albumin (Cr> 1 at 2.13, BUN >30 at 47; total bili not elevated), GI consulted.  Encephalopathy noted to have resolved with hydration.  Transfuse 1 unit PRBCs for symptomatic anemia GI bleed with hemoglobin 7.0  11/15: 3.2 L clear fluid removed via paracentesis.  Negative for SBP.  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  Received IV iron x1.  Discharged in stable condition with plan to follow-up.   A & P   Active Problems:   AMS (altered mental status)   GI bleed   Acute metabolic encephalopathy multifactorial factorial etiology: cirrhosis, AKI on CKD 3 and acute on chronic GI bleed in setting of Eliquis for DVT resolved with hydration.  Ammonia negative.   -Lactulose 30 g twice daily Diuretics held right now as he had an AKI on admission, renal function returned to baseline and may be going for TIPS in the near future -To follow-up with Dr. Carlean Purl outpatient  Symptomatic anemia in setting of GI bleed while on Eliquis for DVT  status post 1 unit PRBCs with initial improvement in hemoglobin.  Eliquis held during hospitalization.  Repeat ultrasound of right lower extremity showing clot.  No further signs of bleeding with a slightly lower hemoglobin. -Eliquis restarted at discharge, okayed by GI -Patient advised to get repeat lab work in the next several days and monitor for any signs of bleeding and return to the hospital if worsening symptoms  AKI on CKD 3 creatinine 2.51 on admission, returned to near baseline IV fluids discharge -Diuretics held  Moderate volume ascites status post paracentesis 11/15 with 3.2 L clear fluid removed.  Received 2 days ceftriaxone for SBP prophylaxis.  Ascitic fluid was negative for any concerning findings. -Follow-up outpatient  Alcoholic liver cirrhosis, Child Class C patient of Dr. Carlean Purl, GI.  Per recent notes, patient still drinking, patient reported his last drink was 3 weeks ago -Holding Home Lasix, Aldactone and nadolol and to follow-up outpatient with GI -Continue midodrine -Strongly advised on alcohol cessation -Sodium  restriction  Right femoral vein DVT on Eliquis -As above  Portal hypertensive gastropathy -PPI twice daily x1 month followed by daily  Consultants  . GI  Procedures  . 1 unit PRBC blood transfusion . Paracentesis . EGD . Right lower extremity ultrasound  Antibiotics  11/14-11/16 ceftriaxone   Subjective  Patient seen and examined at bedside in no acute distress and resting comfortably.  States he feels well and is ready to go home.  Tolerating his diet.  Denies any discomfort and admits to ambulating well. Very anxious to go home today.  He understands he needs to stop drinking alcohol.  He understands he will follow-up with lab work in the coming days and follow-up with GI.  Denies any acute complaints at this time.  Objective   Discharge Exam: Vitals:   05/31/19 0533 05/31/19 1358  BP: (!) 104/52 130/69  Pulse: 85 79  Resp: 20 18  Temp: 98.7 F (37.1 C) (!) 97.2 F (36.2 C)  SpO2: 100% 100%   Vitals:   05/30/19 1959 05/31/19 0009 05/31/19 0533 05/31/19 1358  BP: (!) 105/91 (!) 112/51 (!) 104/52 130/69  Pulse: 92 92 85 79  Resp: (!) 21 20 20 18   Temp: 98.1 F (36.7 C) 98.8 F (37.1 C) 98.7 F (37.1 C) (!) 97.2 F (36.2 C)  TempSrc: Oral Oral Oral Oral  SpO2: 100% 98% 100% 100%  Weight:      Height:        Physical Exam Vitals signs and nursing note reviewed.  Constitutional:      Appearance: Normal appearance.     Comments: cachectic  HENT:     Head: Normocephalic and atraumatic.     Nose: Nose normal.     Mouth/Throat:     Mouth: Mucous membranes are moist.  Eyes:     Extraocular Movements: Extraocular movements intact.  Neck:     Musculoskeletal: Normal range of motion. No neck rigidity.  Cardiovascular:     Rate and Rhythm: Normal rate and regular rhythm.  Pulmonary:     Effort: Pulmonary effort is normal.     Breath sounds: Normal breath sounds.  Abdominal:     General: Abdomen is flat.     Palpations: Abdomen is soft.  Musculoskeletal:  Normal range of motion.        General: No swelling.  Neurological:     General: No focal deficit present.     Mental Status: He is alert. Mental status is at baseline.  Psychiatric:        Mood and Affect: Mood normal.        Behavior: Behavior normal.       The results of significant diagnostics from this hospitalization (including imaging, microbiology, ancillary and laboratory) are listed below for reference.     Microbiology: Recent Results (from the past 240 hour(s))  SARS CORONAVIRUS 2 (TAT 6-24 HRS) Nasopharyngeal Nasopharyngeal Swab     Status: None   Collection Time: 05/28/19  1:45 PM   Specimen: Nasopharyngeal Swab  Result Value Ref Range Status   SARS Coronavirus 2 NEGATIVE NEGATIVE Final    Comment: (NOTE) SARS-CoV-2 target nucleic acids are NOT DETECTED. The SARS-CoV-2 RNA is generally detectable in upper and lower respiratory specimens during the acute phase of infection. Negative results do  not preclude SARS-CoV-2 infection, do not rule out co-infections with other pathogens, and should not be used as the sole basis for treatment or other patient management decisions. Negative results must be combined with clinical observations, patient history, and epidemiological information. The expected result is Negative. Fact Sheet for Patients: SugarRoll.be Fact Sheet for Healthcare Providers: https://www.woods-mathews.com/ This test is not yet approved or cleared by the [place] FDA and  has been authorized for detection and/or diagnosis of SARS-CoV-2 by FDA under an Emergency Use Authorization (EUA). This EUA will remain  in effect (meaning this test can be used) for the duration of the COVID-19 declaration under Section 56 4(b)(1) of the Act, 21 U.S.C. section 360bbb-3(b)(1), unless the authorization is terminated or revoked sooner. Performed at [facility], [place] [address], [place], [place] [address]   Body  fluid culture     Status: None (Preliminary result)   Collection Time: 05/30/19 11:38 AM   Specimen: PATH Cytology Peritoneal fluid  Result Value Ref Range Status   Specimen Description   Final    PERITONEAL Performed at [facility] [address], [place], [place] [address]    Special Requests   Final    NONE Performed at [facility], [place] [address], [place], [place] [address]    Gram Stain NO WBC SEEN NO ORGANISMS SEEN   Final   Culture   Final    NO GROWTH < 24 HOURS Performed at [facility], [place] [address], [place], [place] [address]    Report Status PENDING  Incomplete  Culture, body fluid-bottle     Status: None (Preliminary result)   Collection Time: 05/30/19 11:38 AM   Specimen: Peritoneal Washings  Result Value Ref Range Status   Specimen Description PERITONEAL  Final   Special Requests NONE  Final   Culture   Final    NO GROWTH < 24 HOURS Performed at [facility], [place] [address] [place], [place] [address]    Report Status PENDING  Incomplete  Gram stain     Status: None   Collection Time: 05/30/19 11:38 AM   Specimen: Peritoneal Washings  Result Value Ref Range Status   Specimen Description PERITONEAL  Final   Special Requests NONE  Final   Gram Stain   Final    RARE WBC PRESENT, PREDOMINANTLY MONONUCLEAR NO ORGANISMS SEEN Performed at [facility], [address] [place], [place] [address]    Report Status 05/31/2019 FINAL  Final     Labs: BNP (last 3 results) No results for input(s): BNP in the last 8760 hours. Basic Metabolic Panel: Recent Labs  Lab 05/28/19 1250 05/29/19 0553 05/29/19 1823 05/30/19 0524 05/31/19 0453  NA 133* 133* 132* 126* 132*  K 4.7 4.3 4.1 4.0 4.2  CL 98 100 99 98 104  CO2 24 23 23  21* 21*  GLUCOSE 106* 85 112* 110* 127*  BUN 46* 47* 42* 38* 31*  CREATININE 2.51* 2.13* 1.88* 1.61* 1.59*  CALCIUM 8.4* 8.4* 8.1* 7.6* 7.6*  MG  --   --  1.9 2.0  --   PHOS  --    --  3.8  --   --    Liver Function Tests: Recent Labs  Lab 05/28/19 1250 05/29/19 1823 05/30/19 0524 05/31/19 0453  AST 27 27 24 26   ALT 20 18 16 17   ALKPHOS 73 70 67 64  BILITOT 1.1 1.1 0.7 0.4  PROT 6.4* 5.6* 5.4*  5.2*  ALBUMIN 2.7* 2.2* 2.2* 2.0*   No results for input(s): LIPASE, AMYLASE in the last 168 hours. Recent Labs  Lab 05/28/19 1242  AMMONIA 33   CBC: Recent Labs  Lab 05/28/19 1250 05/29/19 0553  05/29/19 2219 05/30/19 0524 05/30/19 1533 05/30/19 2117 05/31/19 0453  WBC 11.1* 9.7  --   --  6.3  --   --  6.2  NEUTROABS 9.1*  --   --   --   --   --   --   --   HGB 7.6* 7.0*   < > 8.1* 7.8* 8.7* 8.3* 7.5*  HCT 25.1* 22.8*   < > 26.4* 25.3* 28.8* 27.3* 24.7*  MCV 98.8 98.3  --   --  98.1  --   --  99.2  PLT 231 188  --   --  143*  --   --  131*   < > = values in this interval not displayed.   Cardiac Enzymes: No results for input(s): CKTOTAL, CKMB, CKMBINDEX, TROPONINI in the last 168 hours. BNP: Invalid input(s): POCBNP CBG: No results for input(s): GLUCAP in the last 168 hours. D-Dimer No results for input(s): DDIMER in the last 72 hours. Hgb A1c No results for input(s): HGBA1C in the last 72 hours. Lipid Profile No results for input(s): CHOL, HDL, LDLCALC, TRIG, CHOLHDL, LDLDIRECT in the last 72 hours. Thyroid function studies No results for input(s): TSH, T4TOTAL, T3FREE, THYROIDAB in the last 72 hours.  Invalid input(s): FREET3 Anemia work up No results for input(s): VITAMINB12, FOLATE, FERRITIN, TIBC, IRON, RETICCTPCT in the last 72 hours. Urinalysis    Component Value Date/Time   COLORURINE AMBER (A) 01/06/2012 0855   APPEARANCEUR CLEAR 01/06/2012 0855   LABSPEC 1.029 01/06/2012 0855   PHURINE 6.0 01/06/2012 0855   GLUCOSEU NEGATIVE 01/06/2012 0855   HGBUR NEGATIVE 01/06/2012 0855   BILIRUBINUR SMALL (A) 01/06/2012 0855   KETONESUR TRACE (A) 01/06/2012 0855   PROTEINUR NEGATIVE 01/06/2012 0855   UROBILINOGEN 0.2 01/06/2012 0855    NITRITE NEGATIVE 01/06/2012 0855   LEUKOCYTESUR NEGATIVE 01/06/2012 0855   Sepsis Labs Invalid input(s): PROCALCITONIN,  WBC,  LACTICIDVEN Microbiology Recent Results (from the past 240 hour(s))  SARS CORONAVIRUS 2 (TAT 6-24 HRS) Nasopharyngeal Nasopharyngeal Swab     Status: None   Collection Time: 05/28/19  1:45 PM   Specimen: Nasopharyngeal Swab  Result Value Ref Range Status   SARS Coronavirus 2 NEGATIVE NEGATIVE Final    Comment: (NOTE) SARS-CoV-2 target nucleic acids are NOT DETECTED. The SARS-CoV-2 RNA is generally detectable in upper and lower respiratory specimens during the acute phase of infection. Negative results do not preclude SARS-CoV-2 infection, do not rule out co-infections with other pathogens, and should not be used as the sole basis for treatment or other patient management decisions. Negative results must be combined with clinical observations, patient history, and epidemiological information. The expected result is Negative. Fact Sheet for Patients: SugarRoll.be Fact Sheet for Healthcare Providers: https://www.woods-mathews.com/ This test is not yet approved or cleared by the [place] FDA and  has been authorized for detection and/or diagnosis of SARS-CoV-2 by FDA under an Emergency Use Authorization (EUA). This EUA will remain  in effect (meaning this test can be used) for the duration of the COVID-19 declaration under Section 56 4(b)(1) of the Act, 21 U.S.C. section 360bbb-3(b)(1), unless the authorization is terminated or revoked sooner. Performed at [facility], [place] [address] [place], [place] [address]   Body fluid culture  Status: None (Preliminary result)   Collection Time: 05/30/19 11:38 AM   Specimen: PATH Cytology Peritoneal fluid  Result Value Ref Range Status   Specimen Description   Final    PERITONEAL Performed at [facility] [address] [place],  [place] [address]    Special Requests   Final    NONE Performed at [facility], [place] [address] [place], [place] [address]    Gram Stain NO WBC SEEN NO ORGANISMS SEEN   Final   Culture   Final    NO GROWTH < 24 HOURS Performed at [facility], [place] [address] [place], [place] [address]    Report Status PENDING  Incomplete  Culture, body fluid-bottle     Status: None (Preliminary result)   Collection Time: 05/30/19 11:38 AM   Specimen: Peritoneal Washings  Result Value Ref Range Status   Specimen Description PERITONEAL  Final   Special Requests NONE  Final   Culture   Final    NO GROWTH < 24 HOURS Performed at [facility], [place] [address] [place], [place] [address]    Report Status PENDING  Incomplete  Gram stain     Status: None   Collection Time: 05/30/19 11:38 AM   Specimen: Peritoneal Washings  Result Value Ref Range Status   Specimen Description PERITONEAL  Final   Special Requests NONE  Final   Gram Stain   Final    RARE WBC PRESENT, PREDOMINANTLY MONONUCLEAR NO ORGANISMS SEEN Performed at [facility], [address] [place], [place] [address]    Report Status 05/31/2019 FINAL  Final    Discharge Instructions     Discharge Instructions    Diet - low sodium heart healthy   Complete by: As directed    Discharge instructions   Complete by: As directed    You were seen and examined in the hospital for decompensated cirrhosis and you underwent a paracentesis to drain fluid from your belly and cared for by a hospitalist GI doctor  Upon Discharge:  -Continue taking Eliquis as you were before -Stop taking Lasix, nadolol, spironolactone until you follow-up with your GI doctor -You will go for a TIPS procedure likely in the next week, they will call you for an appointment -Follow-up at the infusion center for an additional dose of IV iron -Start taking Carafate by mouth 3 times a day between meals for 1 week -Take Protonix 40 mg  twice daily -Take lactulose 30 g twice daily Make an appointment with your primary care physician within 7 days Get lab work prior to your follow up appointment with your PCP Bring all home medications to your appointment to review Request that your primary physician go over all hospital tests and procedures/radiological results at the follow up.   Please get all hospital records sent to your physician by signing a hospital release before you go home.     Read the complete instructions along with all the possible side effects for all the medicines you take and that have been prescribed to you. Take any new medicines after you have completely understood and accept all the possible adverse reactions/side effects.   If you have any questions about your discharge medications or the care you received while you were in the hospital, you can call the unit and asked to speak with the hospitalist on call. Once you are discharged, your primary care physician will handle any further medical issues. Please note  that NO REFILLS for any discharge medications will be authorized, as it is imperative that you return to your primary care physician (or establish a relationship with a primary care physician if you do not have one) for your aftercare needs so that they can reassess your need for medications and monitor your lab values.   Do not drive, operate heavy machinery, perform activities at heights, swimming or participation in water activities or provide baby sitting services if your were admitted for loss of consciousness/seizures or if you are on sedating medications including, but not limited to benzodiazepines, sleep medications, narcotic pain medications, etc., until you have been cleared to do so by a medical doctor.   Do not take more than prescribed medications.   Wear a seat belt while driving.  If you have smoked or chewed Tobacco in the last 2 years please stop smoking; also stop any regular  Alcohol and/or any Recreational drug use including marijuana.  If you experience worsening of your admission symptoms or develop shortness of breath, chest pain, suicidal or homicidal thoughts or experience a life threatening emergency, you must seek medical attention immediately by calling 911 or calling your PCP immediately.   Increase activity slowly   Complete by: As directed      Allergies as of 05/31/2019      Reactions   Yellow Jacket Venom Anaphylaxis, Shortness Of Breath, Other (See Comments)   Throat closes up/eye swell shut   Nsaids Other (See Comments)   Stomach pain, Bleeding risk      Medication List    STOP taking these medications   furosemide 40 MG tablet Commonly known as: LASIX   nadolol 40 MG tablet Commonly known as: CORGARD   omeprazole 40 MG capsule Commonly known as: PRILOSEC   spironolactone 50 MG tablet Commonly known as: ALDACTONE     TAKE these medications   apixaban 5 MG Tabs tablet Commonly known as: Eliquis Take 1 tablet (5 mg total) by mouth 2 (two) times daily. What changed: Another medication with the same name was removed. Continue taking this medication, and follow the directions you see here.   buPROPion 150 MG 12 hr tablet Commonly known as: WELLBUTRIN SR TAKE 1 TABLET BY MOUTH EVERY DAY   ferrous sulfate 325 (65 FE) MG EC tablet Take 325 mg by mouth every other day.   fluocinonide cream 0.05 % Commonly known as: LIDEX Apply 1 application topically 2 (two) times daily as needed (for skin rash/irritation). APPLY ON THE SKIN TWICE A DAY AS NEEDED FOR RASH   HYDROcodone-acetaminophen 5-325 MG tablet Commonly known as: NORCO/VICODIN Take 1 tablet by mouth 3 (three) times daily as needed (for pain.).   lactulose 10 GM/15ML solution Commonly known as: CHRONULAC Take 45 mLs (30 g total) by mouth 2 (two) times daily.   midodrine 2.5 MG tablet Commonly known as: PROAMATINE Take 1 tablet (2.5 mg total) by mouth 3 (three) times  daily with meals.   mirtazapine 45 MG tablet Commonly known as: REMERON TAKE 1 TABLET (45 MG TOTAL) BY MOUTH AT BEDTIME.   multivitamin with minerals Tabs tablet Take 1 tablet by mouth daily.   pantoprazole 40 MG tablet Commonly known as: Protonix Take 1 tablet (40 mg total) by mouth 2 (two) times daily for 30 days, THEN 1 tablet (40 mg total) daily. Start taking on: May 31, 2019   PreviDent 5000 Booster Plus 1.1 % Pste Generic drug: Sodium Fluoride Place 1 application onto teeth 2 (two) times daily.  psyllium 58.6 % powder Commonly known as: METAMUCIL Take 1 packet by mouth at bedtime.   sucralfate 1 GM/10ML suspension Commonly known as: CARAFATE Take 10 mLs (1 g total) by mouth 3 (three) times daily between meals for 7 days.   vitamin C 250 MG tablet Commonly known as: ASCORBIC ACID Take 250 mg by mouth daily.      Follow-up Information    [facility]. Schedule an appointment as soon as possible for a visit.   Specialty: Gastroenterology Contact information: [place] [phone]         Allergies  Allergen Reactions  . Yellow Jacket Venom Anaphylaxis, Shortness Of Breath and Other (See Comments)    Throat closes up/eye swell shut  . Nsaids Other (See Comments)    Stomach pain, Bleeding risk    Time coordinating discharge: Over 30 minutes   SIGNED:   Harold Hedge, D.O. [facility] Pager: [phone]  05/31/2019, 9:37 PM

## 2019-06-01 ENCOUNTER — Other Ambulatory Visit: Payer: Self-pay | Admitting: Internal Medicine

## 2019-06-01 LAB — PH, BODY FLUID: pH, Body Fluid: 7.5

## 2019-06-01 LAB — LIPASE, FLUID: Lipase-Fluid: 8 U/L

## 2019-06-01 LAB — CYTOLOGY - NON PAP

## 2019-06-02 ENCOUNTER — Telehealth: Payer: Self-pay

## 2019-06-02 ENCOUNTER — Other Ambulatory Visit: Payer: Self-pay

## 2019-06-02 DIAGNOSIS — D500 Iron deficiency anemia secondary to blood loss (chronic): Secondary | ICD-10-CM

## 2019-06-02 LAB — BODY FLUID CULTURE
Culture: NO GROWTH
Gram Stain: NONE SEEN

## 2019-06-02 NOTE — Telephone Encounter (Signed)
Hold off on any more parenteral iron  Have him do a CBC and BMET Thursday

## 2019-06-02 NOTE — Telephone Encounter (Signed)
Patient called back, and he will come in and get his labs done tomorrow morning

## 2019-06-02 NOTE — Telephone Encounter (Signed)
Left message on voice mail and sent message via My Chart, to please come into our lab in the basement tomorrow for labs. And to please call us back and let us know if he can do this

## 2019-06-02 NOTE — Telephone Encounter (Signed)
Transition Care Management Follow-up Telephone Call   Date discharged? 05/31/2019   How have you been since you were released from the hospital? Feels better, feels stronger. Just had some severe diarrhea x 2 today and yesterday-06/01/2019 had this x 8. Immodium is not helping-thinks it could be coming from lactulose possibly? No blood in stool. He has been drinking Gatorade and fluids a lot. Eating well-appetite has improved. No fever.   Do you understand why you were in the hospital? yes   Do you understand the discharge instructions? yes   Where were you discharged to? Home with his wife.   Items Reviewed:  Medications reviewed: yes  Allergies reviewed: yes  Dietary changes reviewed: yes  Referrals reviewed: yes-f/u GI and PCP. Had one iron infusion and will need to repeat in a few days and following up with GI on this.   Functional Questionnaire:   Activities of Daily Living (ADLs):   He states they are independent in the following: ambulation, fixing food and medication, bathing, toileting, hygiene, grooming. States they require assistance with the following: not at this time.   Any transportation issues/concerns?: No   Any patient concerns? Just had some severe diarrhea x 2 today and yesterday-06/01/2019 had it x 8. Immodium is not helping (took 5 Imodiums yesterday and 1 today) -thinks it could be coming from lactulose possibly? No blood in stool. Any advice on this?   Confirmed importance and date/time of follow-up visits scheduled yes  Provider Appointment booked with Dr. Silvio Pate on 06/04/2019.  Confirmed with patient if condition begins to worsen call PCP or go to the ER.  Patient was given the office number and encouraged to call back with question or concerns.  : yes

## 2019-06-03 ENCOUNTER — Other Ambulatory Visit (INDEPENDENT_AMBULATORY_CARE_PROVIDER_SITE_OTHER): Payer: 59

## 2019-06-03 DIAGNOSIS — D500 Iron deficiency anemia secondary to blood loss (chronic): Secondary | ICD-10-CM | POA: Diagnosis not present

## 2019-06-03 LAB — CBC WITH DIFFERENTIAL/PLATELET
Basophils Absolute: 0 10*3/uL (ref 0.0–0.1)
Basophils Relative: 0.5 % (ref 0.0–3.0)
Eosinophils Absolute: 0 10*3/uL (ref 0.0–0.7)
Eosinophils Relative: 0.6 % (ref 0.0–5.0)
HCT: 22.8 % — CL (ref 39.0–52.0)
Hemoglobin: 7.4 g/dL — CL (ref 13.0–17.0)
Lymphocytes Relative: 5.9 % — ABNORMAL LOW (ref 12.0–46.0)
Lymphs Abs: 0.4 10*3/uL — ABNORMAL LOW (ref 0.7–4.0)
MCHC: 32.4 g/dL (ref 30.0–36.0)
MCV: 94.3 fl (ref 78.0–100.0)
Monocytes Absolute: 0.7 10*3/uL (ref 0.1–1.0)
Monocytes Relative: 9.4 % (ref 3.0–12.0)
Neutro Abs: 6.3 10*3/uL (ref 1.4–7.7)
Neutrophils Relative %: 83.6 % — ABNORMAL HIGH (ref 43.0–77.0)
Platelets: 177 10*3/uL (ref 150.0–400.0)
RBC: 2.42 Mil/uL — ABNORMAL LOW (ref 4.22–5.81)
RDW: 15.2 % (ref 11.5–15.5)
WBC: 7.5 10*3/uL (ref 4.0–10.5)

## 2019-06-03 LAB — BASIC METABOLIC PANEL
BUN: 28 mg/dL — ABNORMAL HIGH (ref 6–23)
CO2: 21 mEq/L (ref 19–32)
Calcium: 8.3 mg/dL — ABNORMAL LOW (ref 8.4–10.5)
Chloride: 99 mEq/L (ref 96–112)
Creatinine, Ser: 1.5 mg/dL (ref 0.40–1.50)
GFR: 46.11 mL/min — ABNORMAL LOW (ref >60.00)
Glucose, Bld: 170 mg/dL — ABNORMAL HIGH (ref 70–99)
Potassium: 4.5 mEq/L (ref 3.5–5.1)
Sodium: 127 mEq/L — ABNORMAL LOW (ref 135–145)

## 2019-06-03 LAB — TOTAL BILIRUBIN, BODY FLUID: Total bilirubin, fluid: <0.2 mg/dL

## 2019-06-03 NOTE — Telephone Encounter (Signed)
It probably is from the lactulose Have him cut it in half (I think it was 30 cc bid--so have him take 15 bid) Stop the imodium---won't really help Increase fiber if possible

## 2019-06-03 NOTE — Telephone Encounter (Signed)
Left detailed message on VM per DPR. He has an appt tomorrow so we can address it more in detail.

## 2019-06-04 ENCOUNTER — Inpatient Hospital Stay (HOSPITAL_COMMUNITY)
Admission: EM | Admit: 2019-06-04 | Discharge: 2019-06-09 | DRG: 368 | Disposition: A | Discharge status: Home Health | Payer: 59 | Attending: Internal Medicine | Admitting: Internal Medicine

## 2019-06-04 ENCOUNTER — Encounter (HOSPITAL_COMMUNITY): Payer: Self-pay

## 2019-06-04 ENCOUNTER — Other Ambulatory Visit: Payer: Self-pay

## 2019-06-04 ENCOUNTER — Ambulatory Visit: Payer: 59 | Admitting: Internal Medicine

## 2019-06-04 ENCOUNTER — Telehealth: Payer: Self-pay

## 2019-06-04 DIAGNOSIS — E871 Hypo-osmolality and hyponatremia: Secondary | ICD-10-CM | POA: Diagnosis present

## 2019-06-04 DIAGNOSIS — N179 Acute kidney failure, unspecified: Secondary | ICD-10-CM | POA: Diagnosis present

## 2019-06-04 DIAGNOSIS — Z823 Family history of stroke: Secondary | ICD-10-CM

## 2019-06-04 DIAGNOSIS — I8500 Esophageal varices without bleeding: Secondary | ICD-10-CM | POA: Diagnosis present

## 2019-06-04 DIAGNOSIS — D500 Iron deficiency anemia secondary to blood loss (chronic): Secondary | ICD-10-CM | POA: Diagnosis not present

## 2019-06-04 DIAGNOSIS — K625 Hemorrhage of anus and rectum: Secondary | ICD-10-CM | POA: Diagnosis not present

## 2019-06-04 DIAGNOSIS — Z833 Family history of diabetes mellitus: Secondary | ICD-10-CM

## 2019-06-04 DIAGNOSIS — I10 Essential (primary) hypertension: Secondary | ICD-10-CM | POA: Diagnosis not present

## 2019-06-04 DIAGNOSIS — K7031 Alcoholic cirrhosis of liver with ascites: Secondary | ICD-10-CM | POA: Diagnosis not present

## 2019-06-04 DIAGNOSIS — Z9842 Cataract extraction status, left eye: Secondary | ICD-10-CM

## 2019-06-04 DIAGNOSIS — N183 Chronic kidney disease, stage 3 unspecified: Secondary | ICD-10-CM | POA: Diagnosis not present

## 2019-06-04 DIAGNOSIS — Z79891 Long term (current) use of opiate analgesic: Secondary | ICD-10-CM

## 2019-06-04 DIAGNOSIS — Z79899 Other long term (current) drug therapy: Secondary | ICD-10-CM

## 2019-06-04 DIAGNOSIS — K222 Esophageal obstruction: Secondary | ICD-10-CM | POA: Diagnosis present

## 2019-06-04 DIAGNOSIS — N1831 Chronic kidney disease, stage 3a: Secondary | ICD-10-CM | POA: Diagnosis present

## 2019-06-04 DIAGNOSIS — I129 Hypertensive chronic kidney disease with stage 1 through stage 4 chronic kidney disease, or unspecified chronic kidney disease: Secondary | ICD-10-CM | POA: Diagnosis present

## 2019-06-04 DIAGNOSIS — N433 Hydrocele, unspecified: Secondary | ICD-10-CM | POA: Diagnosis present

## 2019-06-04 DIAGNOSIS — I82401 Acute embolism and thrombosis of unspecified deep veins of right lower extremity: Secondary | ICD-10-CM | POA: Diagnosis not present

## 2019-06-04 DIAGNOSIS — K5730 Diverticulosis of large intestine without perforation or abscess without bleeding: Secondary | ICD-10-CM | POA: Diagnosis present

## 2019-06-04 DIAGNOSIS — F1010 Alcohol abuse, uncomplicated: Secondary | ICD-10-CM | POA: Diagnosis present

## 2019-06-04 DIAGNOSIS — D696 Thrombocytopenia, unspecified: Secondary | ICD-10-CM | POA: Diagnosis present

## 2019-06-04 DIAGNOSIS — K760 Fatty (change of) liver, not elsewhere classified: Secondary | ICD-10-CM | POA: Diagnosis present

## 2019-06-04 DIAGNOSIS — K767 Hepatorenal syndrome: Secondary | ICD-10-CM | POA: Diagnosis present

## 2019-06-04 DIAGNOSIS — K2101 Gastro-esophageal reflux disease with esophagitis, with bleeding: Secondary | ICD-10-CM | POA: Diagnosis present

## 2019-06-04 DIAGNOSIS — K766 Portal hypertension: Secondary | ICD-10-CM | POA: Diagnosis present

## 2019-06-04 DIAGNOSIS — N1832 Chronic kidney disease, stage 3b: Secondary | ICD-10-CM | POA: Diagnosis not present

## 2019-06-04 DIAGNOSIS — Z86010 Personal history of colonic polyps: Secondary | ICD-10-CM

## 2019-06-04 DIAGNOSIS — Z6822 Body mass index (BMI) 22.0-22.9, adult: Secondary | ICD-10-CM

## 2019-06-04 DIAGNOSIS — Z8711 Personal history of peptic ulcer disease: Secondary | ICD-10-CM

## 2019-06-04 DIAGNOSIS — K5520 Angiodysplasia of colon without hemorrhage: Secondary | ICD-10-CM | POA: Diagnosis present

## 2019-06-04 DIAGNOSIS — K3189 Other diseases of stomach and duodenum: Secondary | ICD-10-CM | POA: Diagnosis present

## 2019-06-04 DIAGNOSIS — Z800 Family history of malignant neoplasm of digestive organs: Secondary | ICD-10-CM

## 2019-06-04 DIAGNOSIS — F17290 Nicotine dependence, other tobacco product, uncomplicated: Secondary | ICD-10-CM | POA: Diagnosis present

## 2019-06-04 DIAGNOSIS — Z20828 Contact with and (suspected) exposure to other viral communicable diseases: Secondary | ICD-10-CM | POA: Diagnosis present

## 2019-06-04 DIAGNOSIS — I82411 Acute embolism and thrombosis of right femoral vein: Secondary | ICD-10-CM | POA: Diagnosis present

## 2019-06-04 DIAGNOSIS — K7460 Unspecified cirrhosis of liver: Secondary | ICD-10-CM | POA: Diagnosis not present

## 2019-06-04 DIAGNOSIS — K55069 Acute infarction of intestine, part and extent unspecified: Secondary | ICD-10-CM | POA: Diagnosis present

## 2019-06-04 DIAGNOSIS — K274 Chronic or unspecified peptic ulcer, site unspecified, with hemorrhage: Secondary | ICD-10-CM

## 2019-06-04 DIAGNOSIS — K922 Gastrointestinal hemorrhage, unspecified: Secondary | ICD-10-CM | POA: Diagnosis present

## 2019-06-04 DIAGNOSIS — K640 First degree hemorrhoids: Secondary | ICD-10-CM | POA: Diagnosis present

## 2019-06-04 DIAGNOSIS — K2991 Gastroduodenitis, unspecified, with bleeding: Secondary | ICD-10-CM | POA: Diagnosis not present

## 2019-06-04 DIAGNOSIS — I81 Portal vein thrombosis: Secondary | ICD-10-CM | POA: Diagnosis present

## 2019-06-04 DIAGNOSIS — K7030 Alcoholic cirrhosis of liver without ascites: Secondary | ICD-10-CM | POA: Diagnosis present

## 2019-06-04 DIAGNOSIS — Z7901 Long term (current) use of anticoagulants: Secondary | ICD-10-CM

## 2019-06-04 DIAGNOSIS — E8770 Fluid overload, unspecified: Secondary | ICD-10-CM | POA: Diagnosis not present

## 2019-06-04 DIAGNOSIS — D62 Acute posthemorrhagic anemia: Secondary | ICD-10-CM | POA: Diagnosis present

## 2019-06-04 DIAGNOSIS — H9190 Unspecified hearing loss, unspecified ear: Secondary | ICD-10-CM | POA: Diagnosis present

## 2019-06-04 DIAGNOSIS — R64 Cachexia: Secondary | ICD-10-CM | POA: Diagnosis present

## 2019-06-04 DIAGNOSIS — K921 Melena: Secondary | ICD-10-CM | POA: Diagnosis not present

## 2019-06-04 DIAGNOSIS — Z9049 Acquired absence of other specified parts of digestive tract: Secondary | ICD-10-CM

## 2019-06-04 DIAGNOSIS — Z8042 Family history of malignant neoplasm of prostate: Secondary | ICD-10-CM

## 2019-06-04 DIAGNOSIS — G8929 Other chronic pain: Secondary | ICD-10-CM

## 2019-06-04 DIAGNOSIS — D689 Coagulation defect, unspecified: Secondary | ICD-10-CM | POA: Diagnosis present

## 2019-06-04 DIAGNOSIS — Z961 Presence of intraocular lens: Secondary | ICD-10-CM | POA: Diagnosis present

## 2019-06-04 DIAGNOSIS — Z801 Family history of malignant neoplasm of trachea, bronchus and lung: Secondary | ICD-10-CM

## 2019-06-04 DIAGNOSIS — B3781 Candidal esophagitis: Secondary | ICD-10-CM | POA: Diagnosis present

## 2019-06-04 DIAGNOSIS — E876 Hypokalemia: Secondary | ICD-10-CM | POA: Diagnosis present

## 2019-06-04 DIAGNOSIS — R188 Other ascites: Secondary | ICD-10-CM

## 2019-06-04 DIAGNOSIS — R000 Tachycardia, unspecified: Secondary | ICD-10-CM | POA: Diagnosis present

## 2019-06-04 DIAGNOSIS — I82409 Acute embolism and thrombosis of unspecified deep veins of unspecified lower extremity: Secondary | ICD-10-CM

## 2019-06-04 DIAGNOSIS — R627 Adult failure to thrive: Secondary | ICD-10-CM

## 2019-06-04 DIAGNOSIS — F17210 Nicotine dependence, cigarettes, uncomplicated: Secondary | ICD-10-CM | POA: Diagnosis present

## 2019-06-04 DIAGNOSIS — G479 Sleep disorder, unspecified: Secondary | ICD-10-CM | POA: Diagnosis present

## 2019-06-04 DIAGNOSIS — I8510 Secondary esophageal varices without bleeding: Secondary | ICD-10-CM | POA: Diagnosis not present

## 2019-06-04 DIAGNOSIS — I951 Orthostatic hypotension: Secondary | ICD-10-CM | POA: Diagnosis not present

## 2019-06-04 DIAGNOSIS — Z888 Allergy status to other drugs, medicaments and biological substances status: Secondary | ICD-10-CM

## 2019-06-04 DIAGNOSIS — Z9841 Cataract extraction status, right eye: Secondary | ICD-10-CM

## 2019-06-04 DIAGNOSIS — Z85828 Personal history of other malignant neoplasm of skin: Secondary | ICD-10-CM

## 2019-06-04 DIAGNOSIS — N19 Unspecified kidney failure: Secondary | ICD-10-CM

## 2019-06-04 LAB — COMPREHENSIVE METABOLIC PANEL
ALT: 22 U/L (ref 0–44)
AST: 36 U/L (ref 15–41)
Albumin: 2.6 g/dL — ABNORMAL LOW (ref 3.5–5.0)
Alkaline Phosphatase: 85 U/L (ref 38–126)
Anion gap: 12 (ref 5–15)
BUN: 38 mg/dL — ABNORMAL HIGH (ref 8–23)
CO2: 19 mmol/L — ABNORMAL LOW (ref 22–32)
Calcium: 8.3 mg/dL — ABNORMAL LOW (ref 8.9–10.3)
Chloride: 97 mmol/L — ABNORMAL LOW (ref 98–111)
Creatinine, Ser: 1.89 mg/dL — ABNORMAL HIGH (ref 0.61–1.24)
GFR calc Af Amer: 40 mL/min — ABNORMAL LOW (ref >60)
GFR calc non Af Amer: 35 mL/min — ABNORMAL LOW (ref >60)
Glucose, Bld: 107 mg/dL — ABNORMAL HIGH (ref 70–99)
Potassium: 3.8 mmol/L (ref 3.5–5.1)
Sodium: 128 mmol/L — ABNORMAL LOW (ref 135–145)
Total Bilirubin: 1 mg/dL (ref 0.3–1.2)
Total Protein: 6.1 g/dL — ABNORMAL LOW (ref 6.5–8.1)

## 2019-06-04 LAB — CBC WITH DIFFERENTIAL/PLATELET
Abs Immature Granulocytes: 0.2 10*3/uL — ABNORMAL HIGH (ref 0.00–0.07)
Basophils Absolute: 0.1 10*3/uL (ref 0.0–0.1)
Basophils Relative: 1 %
Eosinophils Absolute: 0 10*3/uL (ref 0.0–0.5)
Eosinophils Relative: 0 %
HCT: 25.3 % — ABNORMAL LOW (ref 39.0–52.0)
Hemoglobin: 7.6 g/dL — ABNORMAL LOW (ref 13.0–17.0)
Immature Granulocytes: 2 %
Lymphocytes Relative: 6 %
Lymphs Abs: 0.7 10*3/uL (ref 0.7–4.0)
MCH: 30 pg (ref 26.0–34.0)
MCHC: 30 g/dL (ref 30.0–36.0)
MCV: 100 fL (ref 80.0–100.0)
Monocytes Absolute: 0.9 10*3/uL (ref 0.1–1.0)
Monocytes Relative: 8 %
Neutro Abs: 10.6 10*3/uL — ABNORMAL HIGH (ref 1.7–7.7)
Neutrophils Relative %: 83 %
Platelets: 225 10*3/uL (ref 150–400)
RBC: 2.53 MIL/uL — ABNORMAL LOW (ref 4.22–5.81)
RDW: 15.6 % — ABNORMAL HIGH (ref 11.5–15.5)
WBC: 12.5 10*3/uL — ABNORMAL HIGH (ref 4.0–10.5)
nRBC: 0.3 % — ABNORMAL HIGH (ref 0.0–0.2)

## 2019-06-04 LAB — CULTURE, BODY FLUID W GRAM STAIN -BOTTLE: Culture: NO GROWTH

## 2019-06-04 LAB — POC OCCULT BLOOD, ED: Fecal Occult Bld: POSITIVE — AB

## 2019-06-04 LAB — PREPARE RBC (CROSSMATCH)

## 2019-06-04 LAB — HEMOGLOBIN AND HEMATOCRIT, BLOOD
HCT: 24.4 % — ABNORMAL LOW (ref 39.0–52.0)
Hemoglobin: 7.4 g/dL — ABNORMAL LOW (ref 13.0–17.0)

## 2019-06-04 LAB — MRSA PCR SCREENING: MRSA by PCR: NEGATIVE

## 2019-06-04 LAB — SARS CORONAVIRUS 2 (TAT 6-24 HRS): SARS Coronavirus 2: NEGATIVE

## 2019-06-04 MED ORDER — ONDANSETRON HCL 4 MG PO TABS: 4.0000 mg | ORAL_TABLET | Freq: Four times a day (QID) | ORAL | Status: DC | PRN

## 2019-06-04 MED ORDER — SODIUM CHLORIDE 0.9 % IV SOLN
INTRAVENOUS | Status: AC
  Administered 2019-06-04: 19:00:00 via INTRAVENOUS

## 2019-06-04 MED ORDER — GUAIFENESIN ER 600 MG PO TB12
600.0000 mg | ORAL_TABLET | Freq: Two times a day (BID) | ORAL | Status: DC
  Administered 2019-06-04 – 2019-06-08 (×9): 600 mg via ORAL
  Filled 2019-06-04 (×10): qty 1

## 2019-06-04 MED ORDER — SODIUM CHLORIDE 0.9 % IV SOLN
INTRAVENOUS | Status: DC | PRN
  Administered 2019-06-04: 250 mL via INTRAVENOUS

## 2019-06-04 MED ORDER — BUPROPION HCL ER (SR) 150 MG PO TB12
150.0000 mg | ORAL_TABLET | Freq: Every day | ORAL | Status: DC
  Administered 2019-06-04 – 2019-06-09 (×6): 150 mg via ORAL
  Filled 2019-06-04 (×6): qty 1

## 2019-06-04 MED ORDER — LACTULOSE 10 GM/15ML PO SOLN
30.0000 g | Freq: Two times a day (BID) | ORAL | Status: DC
  Administered 2019-06-04 – 2019-06-05 (×2): 30 g via ORAL
  Filled 2019-06-04 (×2): qty 45

## 2019-06-04 MED ORDER — FLUOCINONIDE 0.05 % EX CREA
1.0000 "application " | TOPICAL_CREAM | Freq: Two times a day (BID) | CUTANEOUS | Status: DC | PRN
  Administered 2019-06-08: 1 via TOPICAL
  Filled 2019-06-04: qty 15

## 2019-06-04 MED ORDER — SODIUM CHLORIDE 0.9 % IV SOLN
10.0000 mL/h | Freq: Once | INTRAVENOUS | Status: AC
  Administered 2019-06-04: 10 mL/h via INTRAVENOUS

## 2019-06-04 MED ORDER — ACETAMINOPHEN 325 MG PO TABS: 650.0000 mg | ORAL_TABLET | Freq: Four times a day (QID) | ORAL | Status: DC | PRN

## 2019-06-04 MED ORDER — ONDANSETRON HCL 4 MG/2ML IJ SOLN
4.0000 mg | Freq: Four times a day (QID) | INTRAMUSCULAR | Status: DC | PRN
  Administered 2019-06-05: 4 mg via INTRAVENOUS
  Filled 2019-06-04: qty 2

## 2019-06-04 MED ORDER — ACETAMINOPHEN 650 MG RE SUPP: 650.0000 mg | Freq: Four times a day (QID) | RECTAL | Status: DC | PRN

## 2019-06-04 MED ORDER — MIRTAZAPINE 15 MG PO TABS
45.0000 mg | ORAL_TABLET | Freq: Every day | ORAL | Status: DC
  Administered 2019-06-04 – 2019-06-08 (×5): 45 mg via ORAL
  Filled 2019-06-04 (×5): qty 3

## 2019-06-04 MED ORDER — MIDODRINE HCL 5 MG PO TABS
2.5000 mg | ORAL_TABLET | Freq: Three times a day (TID) | ORAL | Status: DC
  Administered 2019-06-05 (×2): 2.5 mg via ORAL
  Filled 2019-06-04 (×3): qty 1

## 2019-06-04 MED ORDER — ALBUTEROL SULFATE (2.5 MG/3ML) 0.083% IN NEBU: 2.5000 mg | INHALATION_SOLUTION | Freq: Four times a day (QID) | RESPIRATORY_TRACT | Status: DC | PRN

## 2019-06-04 MED ORDER — SODIUM CHLORIDE 0.9 % IV SOLN
2.0000 g | INTRAVENOUS | Status: DC
  Administered 2019-06-04 – 2019-06-08 (×5): 2 g via INTRAVENOUS
  Filled 2019-06-04 (×2): qty 2
  Filled 2019-06-04 (×2): qty 20
  Filled 2019-06-04: qty 2

## 2019-06-04 MED ORDER — HYDROCODONE-ACETAMINOPHEN 5-325 MG PO TABS
1.0000 | ORAL_TABLET | Freq: Three times a day (TID) | ORAL | Status: DC | PRN
  Administered 2019-06-04 – 2019-06-09 (×13): 1 via ORAL
  Filled 2019-06-04 (×13): qty 1

## 2019-06-04 MED ORDER — PANTOPRAZOLE SODIUM 40 MG IV SOLR
40.0000 mg | Freq: Two times a day (BID) | INTRAVENOUS | Status: DC
  Administered 2019-06-04 – 2019-06-08 (×9): 40 mg via INTRAVENOUS
  Filled 2019-06-04 (×9): qty 40

## 2019-06-04 MED ORDER — SUCRALFATE 1 GM/10ML PO SUSP
1.0000 g | Freq: Four times a day (QID) | ORAL | Status: DC
  Administered 2019-06-04 – 2019-06-09 (×20): 1 g via ORAL
  Filled 2019-06-04 (×20): qty 10

## 2019-06-04 MED ORDER — CHLORHEXIDINE GLUCONATE CLOTH 2 % EX PADS
6.0000 | MEDICATED_PAD | Freq: Every day | CUTANEOUS | Status: DC
  Administered 2019-06-04 – 2019-06-07 (×4): 6 via TOPICAL

## 2019-06-04 NOTE — Consult Note (Signed)
Consultation  Referring Provider: ER MD/ Vanita Panda Primary Care Physician:  [name], MD Primary Gastroenterologist:  Dr.Gessner  Reason for Consultation:  melena  HPI: Billy [name] is a 73 y.o. male, known to Dr. Carlean Purl, who was just hospitalized and discharged on 05/31/2019.  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  He developed a right lower extremity DVT in September 2020 and has been on Eliquis since. He was admitted 11/13 through 05/31/2019 after he presented with melena, and was noted to have a hemoglobin in the 7 range down from his baseline of 12. He underwent EGD per Dr. Benson Norway last weekend with finding of large esophageal varices which were nonbleeding and severe grade D esophagitis involving 75% of the circumference of the esophagus, also noted mild portal gastropathy. He had repeat lower extremity venous Doppler which showed persistent extensive right lower extremity DVT.  He was discharged back on Eliquis. Patient was treated with twice daily PPI, Carafate suspension, and did require transfusion and IV iron infusion.  On day of discharge his hemoglobin was 7.5.  He did receive 1 unit of packed RBCs.  He had follow-up CBC yesterday through our office with hemoglobin of 7.4  He says he has been feeling okay since discharge from the hospital, has been taking all of his meds as instructed.  He was discharged on Chronulac which apparently gave him severe diarrhea earlier in the week.  He took several Imodium and did not have a bowel movement on Wednesday, or Thursday.  This morning he had a bowel movement with red blood noted on the tissue and in the commode, he says this appeared dark red and the stool itself was darkish red.  Has not had any further episodes since arrival to the emergency room. He did take Eliquis last evening, did not take any meds this morning. He has been hemodynamically stable  and hemoglobin is 7.6 today. He denies any heartburn indigestion dysphagia or odynophagia.  No abdominal pain.    Past Medical History:  Diagnosis Date   Acute deep vein thrombosis (DVT) of femoral vein of right lower extremity ([place]) 03/30/2019   Acute posthemorrhagic anemia    Alcoholic cirrhosis of liver ([place]) 09/24/2011   Years of alcohol use. Progressed from fatty liver to cirrhosis based upon review imaging. Manifestations are portal hypertension with esophageal varices, portal gastropathy, rectal varices and he may have portal colopathy. Ascites also present. Naive to HAV and HBV Claims he has had pneumococcal vaccine    Alcoholism ([place])    Angiodysplasia of colon 09/11/2011   Multiple in the right colon, ablated with APC. Question if this is portal colopathy.    Anxiety    Back pain    Basal cell carcinoma    Bilateral varicoceles    Campylobacter diarrhea 01/07/2012   Cataract    removed   DDD (degenerative disc disease), lumbar    Depression    mild at most   Diverticulosis of colon    Erectile dysfunction    Gastric ulcer 08/2011   small, antral ulcer   GERD (gastroesophageal reflux disease)    takes Omeprazole prn   GI hemorrhage 2006, 2013   esophagitis and 1+ varices, melena in 2013    Grade I diastolic dysfunction    Hearing loss    History of ascites    History of blood transfusion    as a child and no abnormal reaction noted  History of bronchitis    >62yrs ago   History of dizziness    Hydrocele    Hypertension    takes Nadolol daily   Narcotic dependence ([place])    Personal history of colonic polyps    Portal hypertension ([place]) 08/2011   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   Right hydrocele    Scrotal edema    Sleep disturbance    takes Remeron nightly   Splenomegaly    Thrombocytopenia, unspecified ([place])    Varices, esophageal ([place]) 08/2011   Grade 2, mid-esophagus    Past Surgical History:  Procedure Laterality Date    BIOPSY  08/27/2018   Procedure: BIOPSY;  Surgeon: Milus Banister, MD;  Location: [facility];  Service: Endoscopy;;   CATARACT EXTRACTION  2013   bilateral with IOL   CHOLECYSTECTOMY  12/31/2012   Procedure: LAPAROSCOPIC CHOLECYSTECTOMY;  Surgeon: Zenovia Jarred, MD;  Location: [place];  Service: General;;   COLONOSCOPY  2001, 200411/02/2006   COLONOSCOPY  09/11/2011   Procedure: COLONOSCOPY;  Surgeon: Gatha Mayer, MD;  Location: [facility];  Service: Endoscopy;  Laterality: N/A;   COLONOSCOPY     ESOPHAGEAL BANDING N/A 05/02/2017   Procedure: ESOPHAGEAL BANDING;  Surgeon: Gatha Mayer, MD;  Location: [facility];  Service: Endoscopy;  Laterality: N/A;   ESOPHAGEAL BANDING  06/09/2018   Procedure: ESOPHAGEAL BANDING;  Surgeon: Gatha Mayer, MD;  Location: [facility];  Service: Endoscopy;;   ESOPHAGEAL BANDING  08/27/2018   Procedure: ESOPHAGEAL BANDING;  Surgeon: Milus Banister, MD;  Location: [facility];  Service: Endoscopy;;   ESOPHAGOGASTRODUODENOSCOPY  2006   ESOPHAGOGASTRODUODENOSCOPY  09/11/2011   Procedure: ESOPHAGOGASTRODUODENOSCOPY (EGD);  Surgeon: Gatha Mayer, MD;  [facility];  Service: Endoscopy;  Laterality: N/A;   ESOPHAGOGASTRODUODENOSCOPY N/A 08/27/2018   Procedure: ESOPHAGOGASTRODUODENOSCOPY (EGD);  Surgeon: Milus Banister, MD;  Location: [facility];  Service: Endoscopy;  Laterality: N/A;   ESOPHAGOGASTRODUODENOSCOPY (EGD) WITH PROPOFOL N/A 04/13/2017   Procedure: ESOPHAGOGASTRODUODENOSCOPY (EGD) WITH PROPOFOL;  Surgeon: Doran Stabler, MD;  [facility];  Service: Gastroenterology;  Laterality: N/A;   ESOPHAGOGASTRODUODENOSCOPY (EGD) WITH PROPOFOL N/A 05/02/2017   Procedure: ESOPHAGOGASTRODUODENOSCOPY (EGD) WITH PROPOFOL;  Surgeon: Gatha Mayer, MD;  [facility];  Service: Endoscopy;  Laterality: N/A;   ESOPHAGOGASTRODUODENOSCOPY (EGD) WITH PROPOFOL N/A 09/30/2017   Procedure:  ESOPHAGOGASTRODUODENOSCOPY (EGD) WITH PROPOFOL;  Surgeon: Gatha Mayer, MD;  [facility];  Service: Endoscopy;  Laterality: N/A;   ESOPHAGOGASTRODUODENOSCOPY (EGD) WITH PROPOFOL N/A 06/09/2018   Procedure: ESOPHAGOGASTRODUODENOSCOPY (EGD) WITH PROPOFOL;  Surgeon: Gatha Mayer, MD;  [facility];  Service: Endoscopy;  Laterality: N/A;   ESOPHAGOGASTRODUODENOSCOPY (EGD) WITH PROPOFOL N/A 01/05/2019   Procedure: ESOPHAGOGASTRODUODENOSCOPY (EGD) WITH PROPOFOL;  Surgeon: Gatha Mayer, MD;  [facility];  Service: Endoscopy;  Laterality: N/A;   ESOPHAGOGASTRODUODENOSCOPY (EGD) WITH PROPOFOL N/A 05/30/2019   Procedure: ESOPHAGOGASTRODUODENOSCOPY (EGD) WITH PROPOFOL;  Surgeon: Carol Ada, MD;  [facility];  Service: Endoscopy;  Laterality: N/A;   EUS N/A 08/27/2018   Procedure: UPPER ENDOSCOPIC ULTRASOUND (EUS) RADIAL;  Surgeon: Milus Banister, MD;  Location: [facility];  Service: Endoscopy;  Laterality: N/A;   EYE SURGERY     FLEXIBLE SIGMOIDOSCOPY  01/07/2012   Procedure: FLEXIBLE SIGMOIDOSCOPY;  Surgeon: Lafayette Dragon, MD;  Location: [facility];  Service: Endoscopy;  Laterality: N/A;   IR RADIOLOGIST EVAL & MGMT  05/26/2019   KNEE ARTHROSCOPY  2008   Right   KNEE ARTHROSCOPY  2008   Left   POLYPECTOMY     [place] THORA/PARACENTESIS  10/28/2011   Varicocele repair  1980's    Prior to Admission medications   Medication Sig Start Date End Date Taking? Authorizing Provider  apixaban (ELIQUIS) 5 MG TABS tablet Take 1 tablet (5 mg total) by mouth 2 (two) times daily. 03/31/19  Yes Barton Dubois, MD  buPROPion [facility] SR) 150 MG 12 hr tablet TAKE 1 TABLET BY MOUTH EVERY DAY 06/01/19  Yes Viviana Simpler I, MD  ferrous sulfate 325 (65 FE) MG EC tablet Take 325 mg by mouth every other day.   Yes [provider]  fluocinonide cream (LIDEX) [id number] % Apply 1 application topically 2 (two) times daily as needed (for skin  rash/irritation). APPLY ON THE SKIN TWICE A DAY AS NEEDED FOR RASH 12/30/18  Yes [name], MD  HYDROcodone-acetaminophen (NORCO/VICODIN) 5-325 MG tablet Take 1 tablet by mouth 3 (three) times daily as needed (for pain.). 05/27/19  Yes [name], MD  lactulose (CHRONULAC) 10 GM/15ML solution Take 45 mLs (30 g total) by mouth 2 (two) times daily. 05/31/19  Yes Harold Hedge, MD  midodrine (PROAMATINE) 2.5 MG tablet Take 1 tablet (2.5 mg total) by mouth 3 (three) times daily with meals. 05/11/19  Yes Gatha Mayer, MD  mirtazapine (REMERON) 45 MG tablet TAKE 1 TABLET (45 MG TOTAL) BY MOUTH AT BEDTIME. 05/12/19  Yes [name], MD  Multiple Vitamin (MULTIVITAMIN WITH MINERALS) TABS Take 1 tablet by mouth daily.    Yes [provider]  psyllium (METAMUCIL) 58.6 % powder Take 1 packet by mouth at bedtime.    Yes [provider]  sucralfate (CARAFATE) 1 GM/10ML suspension Take 10 mLs (1 g total) by mouth 3 (three) times daily between meals for 7 days. 05/31/19 06/07/19 Yes Harold Hedge, MD  vitamin C (ASCORBIC ACID) 250 MG tablet Take 250 mg by mouth daily.    Yes [provider]  pantoprazole (PROTONIX) 40 MG tablet Take 1 tablet (40 mg total) by mouth 2 (two) times daily for 30 days, THEN 1 tablet (40 mg total) daily. Patient not taking: Reported on 06/04/2019 05/31/19 07/30/19  Harold Hedge, MD  spironolactone (ALDACTONE) 50 MG tablet Take 200 mg by mouth daily. 06/01/19   [provider]    No current facility-administered medications for this encounter.    Current Outpatient Medications  Medication Sig Dispense Refill   apixaban (ELIQUIS) 5 MG TABS tablet Take 1 tablet (5 mg total) by mouth 2 (two) times daily. 60 tablet 3   buPROPion (WELLBUTRIN SR) 150 MG 12 hr tablet TAKE 1 TABLET BY MOUTH EVERY DAY 90 tablet 3   ferrous sulfate 325 (65 FE) MG EC tablet Take 325 mg by mouth every other day.     fluocinonide cream (LIDEX) [id number]  % Apply 1 application topically 2 (two) times daily as needed (for skin rash/irritation). APPLY ON THE SKIN TWICE A DAY AS NEEDED FOR RASH 60 g 2   HYDROcodone-acetaminophen (NORCO/VICODIN) 5-325 MG tablet Take 1 tablet by mouth 3 (three) times daily as needed (for pain.). 90 tablet 0   lactulose (CHRONULAC) 10 GM/15ML solution Take 45 mLs (30 g total) by mouth 2 (two) times daily. 236 mL 0   midodrine (PROAMATINE) 2.5 MG tablet Take 1 tablet (2.5 mg total) by mouth 3 (three) times daily with meals. 90 tablet 1   mirtazapine (REMERON) 45 MG tablet TAKE 1 TABLET (45 MG TOTAL)  BY MOUTH AT BEDTIME. 90 tablet 3   Multiple Vitamin (MULTIVITAMIN WITH MINERALS) TABS Take 1 tablet by mouth daily.      psyllium (METAMUCIL) 58.6 % powder Take 1 packet by mouth at bedtime.      sucralfate (CARAFATE) 1 GM/10ML suspension Take 10 mLs (1 g total) by mouth 3 (three) times daily between meals for 7 days. 210 mL 0   vitamin C (ASCORBIC ACID) 250 MG tablet Take 250 mg by mouth daily.      pantoprazole (PROTONIX) 40 MG tablet Take 1 tablet (40 mg total) by mouth 2 (two) times daily for 30 days, THEN 1 tablet (40 mg total) daily. (Patient not taking: Reported on 06/04/2019) 90 tablet 0   spironolactone (ALDACTONE) 50 MG tablet Take 200 mg by mouth daily.      Allergies as of 06/04/2019 - Review Complete 06/04/2019  Allergen Reaction Noted   Yellow jacket venom Anaphylaxis, Shortness Of Breath, and Other (See Comments) 04/28/2017   Nsaids Other (See Comments) 12/21/2012    Family History  Problem Relation Age of Onset   Stroke Mother    Diabetes Mother    Lung cancer Brother    Prostate cancer Brother    Liver cancer Cousin    Liver cancer Paternal Uncle    Colon cancer Neg Hx    Stomach cancer Neg Hx    Esophageal cancer Neg Hx    Colon polyps Neg Hx    Rectal cancer Neg Hx    Pancreatic cancer Neg Hx     Social History   Socioeconomic History   Marital status: Married     Spouse name: Not on file   Number of children: 1   Years of education: Not on file   Highest education level: Not on file  Occupational History   Occupation: Retired    Comment: mostly from Engineer, 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   Food insecurity    Worry: Not on file    Inability: Not on Lexicographer needs    Medical: Not on file    Non-medical: Not on file  Tobacco Use   Smoking status: Light Tobacco Smoker    Packs/day: 0.50    Types: Cigarettes, E-cigarettes   Smokeless tobacco: Never Used  Substance and Sexual Activity   Alcohol use: Not Currently    Alcohol/week: 0.0 standard drinks   Drug use: Yes    Types: Marijuana    Comment: occasionally, last time couple of weeks ago   Sexual activity: Never  Lifestyle   Physical activity    Days per week: Not on file    Minutes per session: Not on file   Stress: Not on file  Relationships   Social connections    Talks on phone: Not on file    Gets together: Not on file    Attends religious service: Not on file    Active member of club or organization: Not on file    Attends meetings of clubs or organizations: Not on file    Relationship status: Not on file   Intimate partner violence    Fear of current or ex partner: Not on file    Emotionally abused: Not on file    Physically abused: Not on file    Forced sexual activity: Not on file  Other Topics Concern   Not on file  Social History Narrative   Married- 2nd   1 son (adopted) from previous  marriage      Has living will   Wife is  health care POA---alternate is son   Would accept attempts at resuscitation   No tube feeds if cognitively unaware       Review of Systems:Pertinent positive and negative review of systems were noted in the above HPI section.  All other review of systems was otherwise negative.   Intake/Output from previous day: No intake/output data recorded. Intake/Output this shift: No  intake/output data recorded.  Lab Results: Recent Labs    06/03/19 1210 06/04/19 1150  WBC 7.5 12.5*  HGB 7.4 Repeated and verified X2.* 7.6*  HCT 22.8 Repeated and verified X2.* 25.3*  PLT 177.0 225   BMET Recent Labs    06/03/19 1210 06/04/19 1150  NA 127* 128*  K 4.5 3.8  CL 99 97*  CO2 21 19*  GLUCOSE 170* 107*  BUN 28* 38*  CREATININE 1.50 1.89*  CALCIUM 8.3* 8.3*   LFT Recent Labs    06/04/19 1150  PROT 6.1*  ALBUMIN 2.6*  AST 36  ALT 22  ALKPHOS 85  BILITOT 1.0   PT/INR No results for input(s): LABPROT, INR in the last 72 hours.   Well-developed well-nourished chronically ill-appearing older white male in no acute distress.    HEENT; nontraumatic normocephalic, EOMI, PE R LA, sclera anicteric. 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  He is on chronic anticoagulation with Eliquis, and had EGD less than 1 week ago with finding of large esophageal varices which were nonbleeding and severe grade D esophagitis felt to be source of his bleeding at that time.  Also with mild portal gastropathy.  Suspect he is having bleeding secondary to severe esophagitis, cannot rule out variceal bleeding though less likely.  #2 anemia secondary to acute/subacute blood loss #3 history of iron deficiency #4 right lower  extremity DVT, initially diagnosed September 2020.  Doppler earlier this week showed extensive right lower extremity DVT persists #5 persistent ascites-patient had outpatient IR consultation regarding TIPS, and was tentatively to be scheduled within the next week or so. Diuretics held on discharge.  Creatinine has bumped we will continue to hold diuretics  #6 altered mental status at time of last admit-quickly resolved, discharged home on low-dose lactulose which gave him severe diarrhea.  Clinically no encephalopathy today #7 chronic kidney disease   Plan; clear liquid diet Serial hemoglobins-transfusion of 1 unit of packed RBCs has been ordered by ER MD. Start IV Protonix every 12 hours Hold Eliquis We will hold on octreotide for now as doubt variceal bleeding  I think patient needs to be considered for IVC filter as he is likely to have ongoing problems with recurrent bleeding in setting of severe esophagitis and large esophageal varices.  Continue to hold diuretics   Principal Problem:   GI bleed Active Problems:   Essential hypertension, benign   Portal hypertensive gastropathy (HCC)  Iron deficiency anemia due to chronic blood loss   Angiodysplasia of colon   Alcoholic cirrhosis of liver ([facility])   Hydrocele in adult   Right leg DVT ([facility])   Chronic renal disease, stage III     LOS: 0 days   Zoe Creasman EsterwoodPA-C  06/04/2019, 2:30 PM .

## 2019-06-04 NOTE — H&P (Signed)
[facility] History and Physical  Billy [name] [id number] DOB: 1945/12/02 DOA: 06/04/2019  Referring physician: ED  PCP: [name], MD   Chief Complaint: Rectal bleed  HPI: Billy [name] is a 73 y.o. male with past medical history of alcohol abuse, portal hypertension with varices, ascites, angiodysplasia of the colon status post APC treatment in the past, history of diverticulosis of colon, GERD and previous GI bleed,  depression, recent diagnosis of right lower extremity DVT on anticoagulation presented to hospital this time with complaints of rectal bleed.  Patient stated that he did have bright red bleeding this morning and felt slightly dizzy.  Patient denied any chest pain, palpitation, dyspnea or syncope.  Denied any abdominal pain, nausea or vomiting or hematemesis.  Patient stated that he was on lactulose and was having frequent bowel movements but had to take Imodium because of that and had not had a bowel movement yesterday.  Patient denies urinary urgency, frequency or dysuria.  Denies fever, chills or rigors.  Denied shortness of breath.  He however complains of mild abdominal distention without pain.  Patient does have a chronic scrotal swelling from hydrocele.  ED Course: In the ED, patient was noted to be mildly tachycardic with borderline blood pressure.  He was noted to have creatinine of 1.8.  Covid test was sent from the ED which is pending.  Type and cross was also ordered from the ED.  GI was consulted from the ED and hospitalist team was consulted for admission to the hospital.  Review of Systems:  All systems were reviewed and were negative unless otherwise mentioned in the HPI  Past Medical History:  Diagnosis Date  . Acute deep vein thrombosis (DVT) of femoral vein of right lower extremity ([place]) 03/30/2019  . Acute posthemorrhagic anemia   . Alcoholic cirrhosis of liver ([place]) 09/24/2011   Years of alcohol use. Progressed from fatty liver to  cirrhosis based upon review imaging. Manifestations are portal hypertension with esophageal varices, portal gastropathy, rectal varices and he may have portal colopathy. Ascites also present. Naive to HAV and HBV Claims he has had pneumococcal vaccine   . Alcoholism ([place])   . Angiodysplasia of colon 09/11/2011   Multiple in the right colon, ablated with APC. Question if this is portal colopathy.   . Anxiety   . Back pain   . Basal cell carcinoma   . Bilateral varicoceles   . Campylobacter diarrhea 01/07/2012  . Cataract    removed  . DDD (degenerative disc disease), lumbar   . Depression    mild at most  . Diverticulosis of colon   . Erectile dysfunction   . Gastric ulcer 08/2011   small, antral ulcer  . GERD (gastroesophageal reflux disease)    takes Omeprazole prn  . GI hemorrhage 2006, 2013   esophagitis and 1+ varices, melena in 2013   . Grade I diastolic dysfunction   . Hearing loss   . History of ascites   . History of blood transfusion    as a child and no abnormal reaction noted  . History of bronchitis    >50yrs ago  . History of dizziness   . Hydrocele   . Hypertension    takes Nadolol daily  . Narcotic dependence ([place])   . Personal history of colonic polyps   . Portal hypertension ([place]) 08/2011  . 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 Right hydrocele   . Scrotal edema   . Sleep disturbance    takes  Remeron nightly  . Splenomegaly   . Thrombocytopenia, unspecified ([place])   . Varices, esophageal ([place]) 08/2011   Grade 2, mid-esophagus   Past Surgical History:  Procedure Laterality Date  . BIOPSY  08/27/2018   Procedure: BIOPSY;  Surgeon: Milus Banister, MD;  Location: [facility];  Service: Endoscopy;;  . CATARACT EXTRACTION  2013   bilateral with IOL  . CHOLECYSTECTOMY  12/31/2012   Procedure: LAPAROSCOPIC CHOLECYSTECTOMY;  Surgeon: Zenovia Jarred, MD;  Location: [place];  Service: General;;  . COLONOSCOPY  2001, 200411/02/2006  . COLONOSCOPY  09/11/2011   Procedure:  COLONOSCOPY;  Surgeon: Gatha Mayer, MD;  Location: [facility];  Service: Endoscopy;  Laterality: N/A;  . COLONOSCOPY    . ESOPHAGEAL BANDING N/A 05/02/2017   Procedure: ESOPHAGEAL BANDING;  Surgeon: Gatha Mayer, MD;  [facility]: [facility];  Service: Endoscopy;  Laterality: N/A;  . ESOPHAGEAL BANDING  06/09/2018   Procedure: ESOPHAGEAL BANDING;  Surgeon: Gatha Mayer, MD;  [facility]: [facility];  Service: Endoscopy;;  . ESOPHAGEAL BANDING  08/27/2018   Procedure: ESOPHAGEAL BANDING;  Surgeon: Milus Banister, MD;  [facility]: [facility];  Service: Endoscopy;;  . ESOPHAGOGASTRODUODENOSCOPY  2006  . ESOPHAGOGASTRODUODENOSCOPY  09/11/2011   Procedure: ESOPHAGOGASTRODUODENOSCOPY (EGD);  Surgeon: Gatha Mayer, MD;  [facility]: [facility];  Service: Endoscopy;  Laterality: N/A;  . ESOPHAGOGASTRODUODENOSCOPY N/A 08/27/2018   Procedure: ESOPHAGOGASTRODUODENOSCOPY (EGD);  Surgeon: Milus Banister, MD;  Location: [facility];  Service: Endoscopy;  Laterality: N/A;  . ESOPHAGOGASTRODUODENOSCOPY (EGD) WITH PROPOFOL N/A 04/13/2017   Procedure: ESOPHAGOGASTRODUODENOSCOPY (EGD) WITH PROPOFOL;  Surgeon: Doran Stabler, MD;  [facility];  Service: Gastroenterology;  Laterality: N/A;  . ESOPHAGOGASTRODUODENOSCOPY (EGD) WITH PROPOFOL N/A 05/02/2017   Procedure: ESOPHAGOGASTRODUODENOSCOPY (EGD) WITH PROPOFOL;  Surgeon: Gatha Mayer, MD;  [facility];  Service: Endoscopy;  Laterality: N/A;  . ESOPHAGOGASTRODUODENOSCOPY (EGD) WITH PROPOFOL N/A 09/30/2017   Procedure: ESOPHAGOGASTRODUODENOSCOPY (EGD) WITH PROPOFOL;  Surgeon: Gatha Mayer, MD;  [facility];  Service: Endoscopy;  Laterality: N/A;  . ESOPHAGOGASTRODUODENOSCOPY (EGD) WITH PROPOFOL N/A 06/09/2018   Procedure: ESOPHAGOGASTRODUODENOSCOPY (EGD) WITH PROPOFOL;  Surgeon: Gatha Mayer, MD;  [facility];  Service: Endoscopy;  Laterality: N/A;  . ESOPHAGOGASTRODUODENOSCOPY (EGD) WITH PROPOFOL  N/A 01/05/2019   Procedure: ESOPHAGOGASTRODUODENOSCOPY (EGD) WITH PROPOFOL;  Surgeon: Gatha Mayer, MD;  [facility];  Service: Endoscopy;  Laterality: N/A;  . ESOPHAGOGASTRODUODENOSCOPY (EGD) WITH PROPOFOL N/A 05/30/2019   Procedure: ESOPHAGOGASTRODUODENOSCOPY (EGD) WITH PROPOFOL;  Surgeon: Carol Ada, MD;  [facility];  Service: Endoscopy;  Laterality: N/A;  . EUS N/A 08/27/2018   Procedure: UPPER ENDOSCOPIC ULTRASOUND (EUS) RADIAL;  Surgeon: Milus Banister, MD;  [facility];  Service: Endoscopy;  Laterality: N/A;  . EYE SURGERY    . FLEXIBLE SIGMOIDOSCOPY  01/07/2012   Procedure: FLEXIBLE SIGMOIDOSCOPY;  Surgeon: Lafayette Dragon, MD;  Location: [facility];  Service: Endoscopy;  Laterality: N/A;  . IR RADIOLOGIST EVAL & MGMT  05/26/2019  . KNEE ARTHROSCOPY  2008   Right  . KNEE ARTHROSCOPY  2008   Left  . POLYPECTOMY    . [place] THORA/PARACENTESIS  10/28/2011  . Varicocele repair  1980's    Social History:  reports that he has been smoking cigarettes and e-cigarettes. He has been smoking about 0.50 packs per day. He has never used smokeless tobacco. He reports previous alcohol use. He reports current drug use. Drug: Marijuana.  Allergies  Allergen Reactions  . Yellow  Jacket Venom Anaphylaxis, Shortness Of Breath and Other (See Comments)    Throat closes up/eye swell shut  . Nsaids Other (See Comments)    Stomach pain, Bleeding risk    Family History  Problem Relation Age of Onset  . Stroke Mother   . Diabetes Mother   . Lung cancer Brother   . Prostate cancer Brother   . Liver cancer Cousin   . Liver cancer Paternal Uncle   . Colon cancer Neg Hx   . Stomach cancer Neg Hx   . Esophageal cancer Neg Hx   . Colon polyps Neg Hx   . Rectal cancer Neg Hx   . Pancreatic cancer Neg Hx      Prior to Admission medications   Medication Sig Start Date End Date Taking? Authorizing Provider  apixaban (ELIQUIS) 5 MG TABS tablet Take 1 tablet (5 mg  total) by mouth 2 (two) times daily. 03/31/19  Yes Barton Dubois, MD  buPROPion [facility] SR) 150 MG 12 hr tablet TAKE 1 TABLET BY MOUTH EVERY DAY 06/01/19  Yes Viviana Simpler I, MD  ferrous sulfate 325 (65 FE) MG EC tablet Take 325 mg by mouth every other day.   Yes [provider]  fluocinonide cream (LIDEX) [id number] % Apply 1 application topically 2 (two) times daily as needed (for skin rash/irritation). APPLY ON THE SKIN TWICE A DAY AS NEEDED FOR RASH 12/30/18  Yes [name], MD  HYDROcodone-acetaminophen (NORCO/VICODIN) 5-325 MG tablet Take 1 tablet by mouth 3 (three) times daily as needed (for pain.). 05/27/19  Yes [name], MD  lactulose (CHRONULAC) 10 GM/15ML solution Take 45 mLs (30 g total) by mouth 2 (two) times daily. 05/31/19  Yes Harold Hedge, MD  midodrine (PROAMATINE) 2.5 MG tablet Take 1 tablet (2.5 mg total) by mouth 3 (three) times daily with meals. 05/11/19  Yes Gatha Mayer, MD  mirtazapine (REMERON) 45 MG tablet TAKE 1 TABLET (45 MG TOTAL) BY MOUTH AT BEDTIME. 05/12/19  Yes [name], MD  Multiple Vitamin (MULTIVITAMIN WITH MINERALS) TABS Take 1 tablet by mouth daily.    Yes [provider]  psyllium (METAMUCIL) 58.6 % powder Take 1 packet by mouth at bedtime.    Yes [provider]  sucralfate (CARAFATE) 1 GM/10ML suspension Take 10 mLs (1 g total) by mouth 3 (three) times daily between meals for 7 days. 05/31/19 06/07/19 Yes Harold Hedge, MD  vitamin C (ASCORBIC ACID) 250 MG tablet Take 250 mg by mouth daily.    Yes [provider]  pantoprazole (PROTONIX) 40 MG tablet Take 1 tablet (40 mg total) by mouth 2 (two) times daily for 30 days, THEN 1 tablet (40 mg total) daily. Patient not taking: Reported on 06/04/2019 05/31/19 07/30/19  Harold Hedge, MD  spironolactone (ALDACTONE) 50 MG tablet Take 200 mg by mouth daily. 06/01/19   [provider]    Physical Exam: Vitals:   06/04/19 1200 06/04/19 1230  06/04/19 1232 06/04/19 1300  BP: 134/69 (!) 101/55 (!) 111/96 135/62  Pulse: (!) 105 (!) 113 (!) 124 99  Resp:  18 20 15   Temp:      TempSrc:      SpO2: 100% 98% 100% 100%  Weight:      Height:       Wt Readings from Last 3 Encounters:  06/04/19 75 kg  05/30/19 75 kg  05/10/19 75.3 kg   Body mass index is 20.67 kg/m.  General: Alert awake  oriented not in obvious distress, thinly built HENT: Normocephalic, pupils equally reacting to light and accommodation.  Mild pallor noted oral mucosa is moist.  Chest:  Clear breath sounds.  Diminished breath sounds bilaterally. No crackles or wheezes.  CVS: S1 &S2 heard. No murmur.  Regular rate and rhythm. Abdomen: Soft, nontender, distention over the flank.  No tenderness noted bowel sounds are heard.  Extremities: No cyanosis, clubbing but bilateral lower extremity edema noted more on the right.  Peripheral pulses are palpable. Psych: Awake oriented.  Normal mood. CNS:  No cranial nerve deficits.  Power equal in all extremities.   No cerebellar signs.   Skin: Warm and dry.  Bruises noted over the skin.  Spider nevi.  Labs on Admission:   CBC: Recent Labs  Lab 05/29/19 0553  05/30/19 0524 05/30/19 1533 05/30/19 2117 05/31/19 0453 06/03/19 1210 06/04/19 1150  WBC 9.7  --  6.3  --   --  6.2 7.5 12.5*  NEUTROABS  --   --   --   --   --   --  6.3 10.6*  HGB 7.0*   < > 7.8* 8.7* 8.3* 7.5* 7.4 Repeated and verified X2.* 7.6*  HCT 22.8*   < > 25.3* 28.8* 27.3* 24.7* 22.8 Repeated and verified X2.* 25.3*  MCV 98.3  --  98.1  --   --  99.2 94.3 100.0  PLT 188  --  143*  --   --  131* 177.0 225   < > = values in this interval not displayed.    Basic Metabolic Panel: Recent Labs  Lab 05/29/19 1823 05/30/19 0524 05/31/19 0453 06/03/19 1210 06/04/19 1150  NA 132* 126* 132* 127* 128*  K 4.1 4.0 4.2 4.5 3.8  CL 99 98 104 99 97*  CO2 23 21* 21* 21 19*  GLUCOSE 112* 110* 127* 170* 107*  BUN 42* 38* 31* 28* 38*  CREATININE 1.88*  1.61* 1.59* 1.50 1.89*  CALCIUM 8.1* 7.6* 7.6* 8.3* 8.3*  MG 1.9 2.0  --   --   --   PHOS 3.8  --   --   --   --     Liver Function Tests: Recent Labs  Lab 05/29/19 1823 05/30/19 0524 05/31/19 0453 06/04/19 1150  AST 27 24 26  36  ALT 18 16 17 22   ALKPHOS 70 67 64 85  BILITOT 1.1 0.7 0.4 1.0  PROT 5.6* 5.4* 5.2* 6.1*  ALBUMIN 2.2* 2.2* 2.0* 2.6*   No results for input(s): LIPASE, AMYLASE in the last 168 hours. No results for input(s): AMMONIA in the last 168 hours.  Cardiac Enzymes: No results for input(s): CKTOTAL, CKMB, CKMBINDEX, TROPONINI in the last 168 hours.  BNP (last 3 results) No results for input(s): BNP in the last 8760 hours.  ProBNP (last 3 results) No results for input(s): PROBNP in the last 8760 hours.  CBG: No results for input(s): GLUCAP in the last 168 hours.  Lipase     Component Value Date/Time   LIPASE 36 04/13/2017 0531     Urinalysis    Component Value Date/Time   COLORURINE AMBER (A) 01/06/2012 0855   APPEARANCEUR CLEAR 01/06/2012 0855   LABSPEC 1.029 01/06/2012 0855   PHURINE 6.0 01/06/2012 0855   GLUCOSEU NEGATIVE 01/06/2012 0855   HGBUR NEGATIVE 01/06/2012 0855   BILIRUBINUR SMALL (A) 01/06/2012 0855   KETONESUR TRACE (A) 01/06/2012 0855   PROTEINUR NEGATIVE 01/06/2012 0855   UROBILINOGEN 0.2 01/06/2012 0855   NITRITE NEGATIVE 01/06/2012 0855  LEUKOCYTESUR NEGATIVE 01/06/2012 0855     Drugs of Abuse  No results found for: [place], [place], [place], [place], [facility], [place]    Radiological Exams on Admission: No results found.  EKG: EKG not available for review.  Assessment/Plan Principal Problem:   GI bleed Active Problems:   Essential hypertension, benign   Portal hypertensive gastropathy (HCC)   Iron deficiency anemia due to chronic blood loss   Angiodysplasia of colon   Alcoholic cirrhosis of liver (HCC)   Hydrocele in adult   Right leg DVT (HCC)   Chronic renal disease, stage III  Will admit the patient  in the stepdown unit for,  GI bleed.  Hemoccult positive in the ED.  Patient on anticoagulation for recent right lower extremity DVT.  GI has been notified from ED.  Patient does have history of esophagitis, portal hypertension with esophageal varices, angiodysplasia of the colon, diverticulosis.  We will keep the patient clears, IV Protonix, monitor H&H closely.  Hemoglobin at the time of discharge 4 days back was 7.5.  Hemoglobin today 7.6. Of note patient recently had upper GI endoscopy done on previous admission.  Colonoscopy was not performed at that time.  Consider Rocephin IV for SBP prophylaxis.  Transfuse packed RBC for hemoglobin less than 7 or ongoing bleeding.  Hold Eliquis.  Normal saline at 50 mils per hour for today only  Acute kidney injury on chronic kidney disease stage III.  Will closely monitor.  We will try to avoid nephrotoxic medication.  Strict intake and output charting.  Hold diuretic and spironolactone for now.  Creatinine today is 1.8.  Creatinine 4 days back was 1.5.  Normal saline 50 mils per hour for today  Right leg DVT.  Diagnosed September 2020.  On anticoagulation with Eliquis.  Will hold due to ongoing GI bleed.  Patient might benefit from placement of IVC filter due to high risk of bleeding.  Chronic alcohol abuse with the cirrhosis of liver, portal hypertension and esophageal varices.  His history of ascites status post ultrasound-guided paracentesis on 05/30/2019 with removal of 3.2 L of fluid.  No SBP at that time.  As per the medical record review patient has been considered for TIPS for variceal bleeding.  Patient sees Dr. Carlean Purl GI as outpatient.  Continue midodrine.   Hydrocele, chronic.  Hyponatremia.  Likely secondary to chronic liver disease and being on diuretics.  Closely monitor.  Hold diuretics for now  DVT Prophylaxis: SCD due to GI bleed  Consultant: GI notified from the ED  Code Status: Full code  Microbiology none  Antibiotics: Rocephin  prophylaxis  Family Communication:  Patients' condition and plan of care including tests being ordered have been discussed with the patient who indicate understanding and agree with the plan.  Disposition Plan: Home with home health.  Lives with his spouse at home.  Severity of Illness: The appropriate patient status for this patient is INPATIENT. Inpatient status is judged to be reasonable and necessary in order to provide the required intensity of service to ensure the patient's safety. The patient's presenting symptoms, physical exam findings, and initial radiographic and laboratory data in the context of their chronic comorbidities is felt to place them at high risk for further clinical deterioration. Furthermore, it is not anticipated that the patient will be medically stable for discharge from the hospital within 2 midnights of admission.  I certify that at the point of admission it is my clinical judgment that the patient will require inpatient hospital care spanning  beyond 2 midnights from the point of admission due to high intensity of service, high risk for further deterioration and high frequency of surveillance required.   Signed, Flora Lipps, MD [facility] 06/04/2019

## 2019-06-04 NOTE — ED Triage Notes (Signed)
Pt returns today stating that there is still blood in his stools. Pt states that it is bright red.

## 2019-06-04 NOTE — Telephone Encounter (Signed)
noted 

## 2019-06-04 NOTE — Telephone Encounter (Signed)
Mrs. Holk called, wife is taking patient to the hospital now, they think he is bleeding internally again. Appointment for hospital follow up cancelled for today. FYI to PCP

## 2019-06-04 NOTE — Telephone Encounter (Signed)
Mrs Janke called back and was asking if they should just go to ER by themselves or call 911 ( I said I thought you guys already went when you called first but Mrs Joachim said not yet). Patient is coherent and his symptoms are noticing blood in stool again but not as severe as it was when they went to ER last time, yet. They will go ahead and go to [facility] by careand asked for [place] to call ER to let them know. I advised patient's wife I would call but was not sure if that would speed things up. I called [facility] and spoke with Mendel Ryder. Their beds are full and if patient does get admitted they will be held in ED for a while. FYI.

## 2019-06-04 NOTE — Telephone Encounter (Signed)
Okay---I will follow his progress in the ER

## 2019-06-04 NOTE — ED Provider Notes (Signed)
[facility] DEPT Provider Note   CSN: [id number] Arrival date & time: 06/04/19  1027     History   Chief Complaint Chief Complaint  Patient presents with  . Blood In Stools    Billy [name] is a 73 y.o. male.     Billy Patient with multiple medical issues including history of DVT, on chronic anticoagulation presents with rectal bleeding x1. He notes that since a recent hospitalization he has been doing generally well. However, today he had 1 bowel movement with blood in the toilet bowl. No lightheadedness, no chest pain, no dyspnea, no abdominal pain, no other complaints. No recent changes in medication, diet, activity. He notes that he did have endoscopy within the past 2 weeks, which was reportedly unremarkable.  Past Medical History:  Diagnosis Date  . Acute deep vein thrombosis (DVT) of femoral vein of right lower extremity ([place]) 03/30/2019  . Acute posthemorrhagic anemia   . Alcoholic cirrhosis of liver ([place]) 09/24/2011   Years of alcohol use. Progressed from fatty liver to cirrhosis based upon review imaging. Manifestations are portal hypertension with esophageal varices, portal gastropathy, rectal varices and he may have portal colopathy. Ascites also present. Naive to HAV and HBV Claims he has had pneumococcal vaccine   . Alcoholism ([place])   . Angiodysplasia of colon 09/11/2011   Multiple in the right colon, ablated with APC. Question if this is portal colopathy.   . Anxiety   . Back pain   . Basal cell carcinoma   . Bilateral varicoceles   . Campylobacter diarrhea 01/07/2012  . Cataract    removed  . DDD (degenerative disc disease), lumbar   . Depression    mild at most  . Diverticulosis of colon   . Erectile dysfunction   . Gastric ulcer 08/2011   small, antral ulcer  . GERD (gastroesophageal reflux disease)    takes Omeprazole prn  . GI hemorrhage 2006, 2013   esophagitis and 1+ varices, melena in 2013   . Grade I  diastolic dysfunction   . Hearing loss   . History of ascites   . History of blood transfusion    as a child and no abnormal reaction noted  . History of bronchitis    >54yrs ago  . History of dizziness   . Hydrocele   . Hypertension    takes Nadolol daily  . Narcotic dependence ([place])   . Personal history of colonic polyps   . Portal hypertension ([place]) 08/2011  . 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 Right hydrocele   . Scrotal edema   . Sleep disturbance    takes Remeron nightly  . Splenomegaly   . Thrombocytopenia, unspecified ([place])   . Varices, esophageal ([place]) 08/2011   Grade 2, mid-esophagus    Patient Active Problem List   Diagnosis Date Noted  . GI bleed 05/29/2019  . AMS (altered mental status) 05/28/2019  . Light headed 05/10/2019  . Chronic renal disease, stage III 04/02/2019  . Gastroesophageal reflux disease with esophagitis   . Acute deep vein thrombosis (DVT) of femoral vein of right lower extremity ([place]) 03/30/2019  . Right leg DVT ([place])   . Narcotic dependence ([place]) 05/19/2017  . Secondary esophageal varices with bleeding ([place])   . Leg pain, left 04/24/2016  . Ascites 11/20/2015  . Scrotal edema 11/20/2015  . Hydrocele in adult 11/20/2015  . Alcohol dependence ([place]) 09/26/2014  . Thrombocytopenia ([place]) 09/26/2014  . Routine general medical examination at a health  care facility 09/16/2012  . Alcoholic cirrhosis of liver ([place]) 09/24/2011  . Portal hypertension ([place]) 09/11/2011  . Angiodysplasia of colon 09/11/2011  . 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 Iron deficiency anemia due to chronic blood loss 09/05/2011  . Personal history of colonic polyps 09/05/2011  . DEGENERATIVE DISC DISEASE, LUMBAR SPINE 10/12/2007  . SLEEP DISORDER 10/12/2007  . Depression 08/06/2006  . Essential hypertension, benign 08/06/2006  . Portal hypertensive gastropathy ([place]) 08/06/2006  . DIVERTICULOSIS, COLON 08/06/2006  . Chronic back pain greater than 3 months duration 08/06/2006     Past Surgical History:  Procedure Laterality Date  . BIOPSY  08/27/2018   Procedure: BIOPSY;  Surgeon: Milus Banister, MD;  Location: [facility];  Service: Endoscopy;;  . CATARACT EXTRACTION  2013   bilateral with IOL  . CHOLECYSTECTOMY  12/31/2012   Procedure: LAPAROSCOPIC CHOLECYSTECTOMY;  Surgeon: Zenovia Jarred, MD;  Location: [facility];  Service: General;;  . COLONOSCOPY  2001, 200411/02/2006  . COLONOSCOPY  09/11/2011   Procedure: COLONOSCOPY;  Surgeon: Gatha Mayer, MD;  Location: [facility];  Service: Endoscopy;  Laterality: N/A;  . COLONOSCOPY    . ESOPHAGEAL BANDING N/A 05/02/2017   Procedure: ESOPHAGEAL BANDING;  Surgeon: Gatha Mayer, MD;  [facility]: [facility];  Service: Endoscopy;  Laterality: N/A;  . ESOPHAGEAL BANDING  06/09/2018   Procedure: ESOPHAGEAL BANDING;  Surgeon: Gatha Mayer, MD;  [facility]: [facility];  Service: Endoscopy;;  . ESOPHAGEAL BANDING  08/27/2018   Procedure: ESOPHAGEAL BANDING;  Surgeon: Milus Banister, MD;  [facility];  Service: Endoscopy;;  . ESOPHAGOGASTRODUODENOSCOPY  2006  . ESOPHAGOGASTRODUODENOSCOPY  09/11/2011   Procedure: ESOPHAGOGASTRODUODENOSCOPY (EGD);  Surgeon: Gatha Mayer, MD;  [facility];  Service: Endoscopy;  Laterality: N/A;  . ESOPHAGOGASTRODUODENOSCOPY N/A 08/27/2018   Procedure: ESOPHAGOGASTRODUODENOSCOPY (EGD);  Surgeon: Milus Banister, MD;  [facility];  Service: Endoscopy;  Laterality: N/A;  . ESOPHAGOGASTRODUODENOSCOPY (EGD) WITH PROPOFOL N/A 04/13/2017   Procedure: ESOPHAGOGASTRODUODENOSCOPY (EGD) WITH PROPOFOL;  Surgeon: Doran Stabler, MD;  [facility];  Service: Gastroenterology;  Laterality: N/A;  . ESOPHAGOGASTRODUODENOSCOPY (EGD) WITH PROPOFOL N/A 05/02/2017   Procedure: ESOPHAGOGASTRODUODENOSCOPY (EGD) WITH PROPOFOL;  Surgeon: Gatha Mayer, MD;  [facility];  Service: Endoscopy;  Laterality: N/A;  . ESOPHAGOGASTRODUODENOSCOPY (EGD) WITH  PROPOFOL N/A 09/30/2017   Procedure: ESOPHAGOGASTRODUODENOSCOPY (EGD) WITH PROPOFOL;  Surgeon: Gatha Mayer, MD;  [facility];  Service: Endoscopy;  Laterality: N/A;  . ESOPHAGOGASTRODUODENOSCOPY (EGD) WITH PROPOFOL N/A 06/09/2018   Procedure: ESOPHAGOGASTRODUODENOSCOPY (EGD) WITH PROPOFOL;  Surgeon: Gatha Mayer, MD;  [facility];  Service: Endoscopy;  Laterality: N/A;  . ESOPHAGOGASTRODUODENOSCOPY (EGD) WITH PROPOFOL N/A 01/05/2019   Procedure: ESOPHAGOGASTRODUODENOSCOPY (EGD) WITH PROPOFOL;  Surgeon: Gatha Mayer, MD;  [facility];  Service: Endoscopy;  Laterality: N/A;  . ESOPHAGOGASTRODUODENOSCOPY (EGD) WITH PROPOFOL N/A 05/30/2019   Procedure: ESOPHAGOGASTRODUODENOSCOPY (EGD) WITH PROPOFOL;  Surgeon: Carol Ada, MD;  [facility];  Service: Endoscopy;  Laterality: N/A;  . EUS N/A 08/27/2018   Procedure: UPPER ENDOSCOPIC ULTRASOUND (EUS) RADIAL;  Surgeon: Milus Banister, MD;  Location[facility];  Service: Endoscopy;  Laterality: N/A;  . EYE SURGERY    . FLEXIBLE SIGMOIDOSCOPY  01/07/2012   Procedure: FLEXIBLE SIGMOIDOSCOPY;  Surgeon: Lafayette Dragon, MD;  Location: [facility];  Service: Endoscopy;  Laterality: N/A;  . IR RADIOLOGIST EVAL & MGMT  05/26/2019  . KNEE ARTHROSCOPY  2008   Right  . KNEE ARTHROSCOPY  2008   Left  . POLYPECTOMY    . [place] THORA/PARACENTESIS  10/28/2011  . Varicocele repair  1980's        Home Medications    Prior to Admission medications   Medication Sig Start Date End Date Taking? Authorizing Provider  apixaban (ELIQUIS) 5 MG TABS tablet Take 1 tablet (5 mg total) by mouth 2 (two) times daily. 03/31/19  Yes Barton Dubois, MD  buPROPion [facility] SR) 150 MG 12 hr tablet TAKE 1 TABLET BY MOUTH EVERY DAY 06/01/19  Yes Viviana Simpler I, MD  ferrous sulfate 325 (65 FE) MG EC tablet Take 325 mg by mouth every other day.   Yes [provider]  fluocinonide cream (LIDEX) [id number] % Apply 1  application topically 2 (two) times daily as needed (for skin rash/irritation). APPLY ON THE SKIN TWICE A DAY AS NEEDED FOR RASH 12/30/18  Yes [name], MD  HYDROcodone-acetaminophen (NORCO/VICODIN) 5-325 MG tablet Take 1 tablet by mouth 3 (three) times daily as needed (for pain.). 05/27/19  Yes [name], MD  lactulose (CHRONULAC) 10 GM/15ML solution Take 45 mLs (30 g total) by mouth 2 (two) times daily. 05/31/19  Yes Harold Hedge, MD  midodrine (PROAMATINE) 2.5 MG tablet Take 1 tablet (2.5 mg total) by mouth 3 (three) times daily with meals. 05/11/19  Yes Gatha Mayer, MD  mirtazapine (REMERON) 45 MG tablet TAKE 1 TABLET (45 MG TOTAL) BY MOUTH AT BEDTIME. 05/12/19  Yes [name], MD  Multiple Vitamin (MULTIVITAMIN WITH MINERALS) TABS Take 1 tablet by mouth daily.    Yes [provider]  psyllium (METAMUCIL) 58.6 % powder Take 1 packet by mouth at bedtime.    Yes [provider]  sucralfate (CARAFATE) 1 GM/10ML suspension Take 10 mLs (1 g total) by mouth 3 (three) times daily between meals for 7 days. 05/31/19 06/07/19 Yes Harold Hedge, MD  vitamin C (ASCORBIC ACID) 250 MG tablet Take 250 mg by mouth daily.    Yes [provider]  pantoprazole (PROTONIX) 40 MG tablet Take 1 tablet (40 mg total) by mouth 2 (two) times daily for 30 days, THEN 1 tablet (40 mg total) daily. Patient not taking: Reported on 06/04/2019 05/31/19 07/30/19  Harold Hedge, MD  spironolactone (ALDACTONE) 50 MG tablet Take 200 mg by mouth daily. 06/01/19   [provider]    Family History Family History  Problem Relation Age of Onset  . Stroke Mother   . Diabetes Mother   . Lung cancer Brother   . Prostate cancer Brother   . Liver cancer Cousin   . Liver cancer Paternal Uncle   . Colon cancer Neg Hx   . Stomach cancer Neg Hx   . Esophageal cancer Neg Hx   . Colon polyps Neg Hx   . Rectal cancer Neg Hx   . Pancreatic cancer Neg Hx     Social  History Social History   Tobacco Use  . Smoking status: Light Tobacco Smoker    Packs/day: 0.50    Types: Cigarettes, E-cigarettes  . Smokeless tobacco: Never Used  Substance Use Topics  . Alcohol use: Not Currently    Alcohol/week: 0.0 standard drinks  . Drug use: Yes    Types: Marijuana    Comment: occasionally, last time couple of weeks ago     Allergies   Yellow jacket venom and Nsaids   Review of Systems Review of Systems  Constitutional:  Per Billy, otherwise negative  HENT:       Per Billy, otherwise negative  Respiratory:       Per Billy, otherwise negative  Cardiovascular:       Per Billy, otherwise negative  Gastrointestinal: Positive for blood in stool. Negative for abdominal pain and vomiting.  Endocrine:       Negative aside from Billy  Genitourinary:       Neg aside from Billy   Musculoskeletal:       Per Billy, otherwise negative  Skin: Negative.   Neurological: Negative for syncope.  Psychiatric/Behavioral:       Dementia     Physical Exam Updated Vital Signs BP 135/62   Pulse 99   Temp 97.8 F (36.6 C) (Oral)   Resp 15   Ht 6\' 3"  (1.905 m)   Wt 75 kg   SpO2 100%   BMI 20.67 kg/m   Physical Exam Vitals signs and nursing note reviewed.  Constitutional:      General: He is not in acute distress.    Appearance: He is well-developed.     Comments: Thin adult male awake, alert, standing upright, speaking clearly  HENT:     Head: Normocephalic and atraumatic.  Eyes:     Conjunctiva/sclera: Conjunctivae normal.  Cardiovascular:     Rate and Rhythm: Normal rate and regular rhythm.  Pulmonary:     Effort: Pulmonary effort is normal. No respiratory distress.     Breath sounds: No stridor.  Abdominal:     General: There is no distension.     Tenderness: There is no abdominal tenderness. There is no guarding.  Genitourinary:    Rectum: Guaiac result positive. Tenderness present. Normal anal tone.    Skin:    General: Skin is warm and dry.   Neurological:     Mental Status: He is alert and oriented to person, place, and time.     Motor: Atrophy present.  Psychiatric:     Comments: Patient is appropriately interactive, though does seemingly have some memory loss.      ED Treatments / Results  Labs (all labs ordered are listed, but only abnormal results are displayed) Labs Reviewed  COMPREHENSIVE METABOLIC PANEL - Abnormal; Notable for the following components:      Result Value   Sodium 128 (*)    Chloride 97 (*)    CO2 19 (*)    Glucose, Bld 107 (*)    BUN 38 (*)    Creatinine, Ser 1.89 (*)    Calcium 8.3 (*)    Total Protein 6.1 (*)    Albumin 2.6 (*)    GFR calc non Af Amer 35 (*)    GFR calc Af Amer 40 (*)    All other components within normal limits  CBC WITH DIFFERENTIAL/PLATELET - Abnormal; Notable for the following components:   WBC 12.5 (*)    RBC 2.53 (*)    Hemoglobin 7.6 (*)    HCT 25.3 (*)    RDW 15.6 (*)    nRBC 0.3 (*)    Neutro Abs 10.6 (*)    Abs Immature Granulocytes 0.20 (*)    All other components within normal limits  POC OCCULT BLOOD, ED - Abnormal; Notable for the following components:   Fecal Occult Bld POSITIVE (*)    All other components within normal limits  SARS CORONAVIRUS 2 (TAT 6-24 HRS)  TYPE AND SCREEN  PREPARE RBC (CROSSMATCH)     Procedures Procedures (including critical care  time)  CRITICAL CARE Performed by: Carmin Muskrat Total critical care time: 35 minutes Critical care time was exclusive of separately billable procedures and treating other patients. Critical care was necessary to treat or prevent imminent or life-threatening deterioration. Critical care was time spent personally by me on the following activities: development of treatment plan with patient and/or surrogate as well as nursing, discussions with consultants, evaluation of patient's response to treatment, examination of patient, obtaining history from patient or surrogate, ordering and performing  treatments and interventions, ordering and review of laboratory studies, ordering and review of radiographic studies, pulse oximetry and re-evaluation of patient's condition.    Medications Ordered in ED Medications  0.9 %  sodium chloride infusion (has no administration in time range)  blood transfusion   Initial Impression / Assessment and Plan / ED Course  I have reviewed the triage vital signs and the nursing notes.  Pertinent labs & imaging results that were available during my care of the patient were reviewed by me and considered in my medical decision making (see chart for details).    After the initial evaluation I reviewed the patient's chart.  Pertinent elements from discharge summary of earlier this week as below: Acute metabolic encephalopathy multifactorial factorial etiology: cirrhosis, AKI on CKD 3 and acute on chronic GI bleed in setting of Eliquis for DVT resolved with hydration.  Ammonia negative.   -Lactulose 30 g twice daily Diuretics held right now as he had an AKI on admission, renal function returned to baseline and may be going for TIPS in the near future -To follow-up with Dr. Carlean Purl outpatient   Symptomatic anemia in setting of GI bleed while on Eliquis for DVT  status post 1 unit PRBCs with initial improvement in hemoglobin.  Eliquis held during hospitalization.  Repeat ultrasound of right lower extremity showing clot.  No further signs of bleeding with a slightly lower hemoglobin. -Eliquis restarted at discharge, okayed by GI -Patient advised to get repeat lab work in the next several days and monitor for any signs of bleeding and return to the hospital if worsening symptoms    With consideration of recurrence of bleed, likely exacerbated with Eliquis, patient will have labs, fluids. 1:37 PM Labs consistent with anemia, hemoglobin 7.3, down from value of just after transfusion. Acknowledging that the patient recently restarted his Eliquis due to persistent  DVT, I discussed this case with his gastroenterology team.  With concern for symptomatic anemia, ongoing bleeding, anticoagulant use, he will require transfusion, admission. Patient is aware of this, remains hemodynamically unremarkable, in agreement with plan, though unhappy.  Final Clinical Impressions(s) / ED Diagnoses   Final diagnoses:  Lower GI bleed     Carmin Muskrat, MD 06/04/19 1339

## 2019-06-05 DIAGNOSIS — D500 Iron deficiency anemia secondary to blood loss (chronic): Secondary | ICD-10-CM

## 2019-06-05 DIAGNOSIS — K7460 Unspecified cirrhosis of liver: Secondary | ICD-10-CM

## 2019-06-05 LAB — TYPE AND SCREEN
ABO/RH(D): O POS
Antibody Screen: NEGATIVE
Unit division: 0

## 2019-06-05 LAB — HEMOGLOBIN AND HEMATOCRIT, BLOOD
HCT: 24.6 % — ABNORMAL LOW (ref 39.0–52.0)
HCT: 26.7 % — ABNORMAL LOW (ref 39.0–52.0)
HCT: 26.7 % — ABNORMAL LOW (ref 39.0–52.0)
HCT: 25.8 % — ABNORMAL LOW (ref 39.0–52.0)
Hemoglobin: 7.5 g/dL — ABNORMAL LOW (ref 13.0–17.0)
Hemoglobin: 8.2 g/dL — ABNORMAL LOW (ref 13.0–17.0)
Hemoglobin: 8.1 g/dL — ABNORMAL LOW (ref 13.0–17.0)
Hemoglobin: 8 g/dL — ABNORMAL LOW (ref 13.0–17.0)

## 2019-06-05 LAB — CBC
HCT: 25.1 % — ABNORMAL LOW (ref 39.0–52.0)
Hemoglobin: 7.7 g/dL — ABNORMAL LOW (ref 13.0–17.0)
MCH: 29.5 pg (ref 26.0–34.0)
MCHC: 30.7 g/dL (ref 30.0–36.0)
MCV: 96.2 fL (ref 80.0–100.0)
Platelets: 146 10*3/uL — ABNORMAL LOW (ref 150–400)
RBC: 2.61 MIL/uL — ABNORMAL LOW (ref 4.22–5.81)
RDW: 17.3 % — ABNORMAL HIGH (ref 11.5–15.5)
WBC: 10 10*3/uL (ref 4.0–10.5)
nRBC: 0.3 % — ABNORMAL HIGH (ref 0.0–0.2)

## 2019-06-05 LAB — BPAM RBC
Blood Product Expiration Date: 202012212359
ISSUE DATE / TIME: 202011201447
Unit Type and Rh: 5100

## 2019-06-05 LAB — COMPREHENSIVE METABOLIC PANEL
ALT: 21 U/L (ref 0–44)
AST: 35 U/L (ref 15–41)
Albumin: 2.1 g/dL — ABNORMAL LOW (ref 3.5–5.0)
Alkaline Phosphatase: 79 U/L (ref 38–126)
Anion gap: 10 (ref 5–15)
BUN: 35 mg/dL — ABNORMAL HIGH (ref 8–23)
CO2: 18 mmol/L — ABNORMAL LOW (ref 22–32)
Calcium: 8.1 mg/dL — ABNORMAL LOW (ref 8.9–10.3)
Chloride: 102 mmol/L (ref 98–111)
Creatinine, Ser: 1.75 mg/dL — ABNORMAL HIGH (ref 0.61–1.24)
GFR calc Af Amer: 44 mL/min — ABNORMAL LOW (ref >60)
GFR calc non Af Amer: 38 mL/min — ABNORMAL LOW (ref >60)
Glucose, Bld: 127 mg/dL — ABNORMAL HIGH (ref 70–99)
Potassium: 4 mmol/L (ref 3.5–5.1)
Sodium: 130 mmol/L — ABNORMAL LOW (ref 135–145)
Total Bilirubin: 0.4 mg/dL (ref 0.3–1.2)
Total Protein: 5.3 g/dL — ABNORMAL LOW (ref 6.5–8.1)

## 2019-06-05 LAB — TSH: TSH: 2.194 u[IU]/mL (ref 0.350–4.500)

## 2019-06-05 LAB — PROTIME-INR
INR: 1.7 — ABNORMAL HIGH (ref 0.8–1.2)
Prothrombin Time: 20.3 seconds — ABNORMAL HIGH (ref 11.4–15.2)

## 2019-06-05 LAB — MAGNESIUM: Magnesium: 2 mg/dL (ref 1.7–2.4)

## 2019-06-05 LAB — CORTISOL: Cortisol, Plasma: 19 ug/dL

## 2019-06-05 LAB — OSMOLALITY: Osmolality: 286 mOsm/kg (ref 275–295)

## 2019-06-05 MED ORDER — LACTULOSE 10 GM/15ML PO SOLN
20.0000 g | Freq: Every day | ORAL | Status: DC
  Administered 2019-06-06 – 2019-06-09 (×4): 20 g via ORAL
  Filled 2019-06-05 (×4): qty 30

## 2019-06-05 MED ORDER — SILVER SULFADIAZINE 1 % EX CREA
TOPICAL_CREAM | Freq: Two times a day (BID) | CUTANEOUS | Status: DC
  Administered 2019-06-05 – 2019-06-09 (×8): via TOPICAL
  Filled 2019-06-05: qty 50

## 2019-06-05 NOTE — Evaluation (Signed)
Physical Therapy Evaluation Patient Details Name: BILLIE LUEBBERS MRN: [id number] DOB: 10-09-1945 Today's Date: 06/05/2019   History of Present Illness  73 y.o. male with past medical history of alcohol abuse, portal hypertension with varices, ascites, angiodysplasia of the colon status post APC treatment in the past, history of diverticulosis of colon, GERD and previous GI bleed,  depression, recent diagnosis of right lower extremity DVT on anticoagulation presented to hospital this time with complaints of rectal bleed.  Clinical Impression  Pt admitted with above diagnosis. Min/guard assist for stand pivot transfer to bedside commode, HR up to 141 with transfer, ambulation deferred 2* tachycardia. Pt demonstrates decreased safety awareness.  Pt currently with functional limitations due to the deficits listed below (see PT Problem List). Pt will benefit from skilled PT to increase their independence and safety with mobility to allow discharge to the venue listed below.       Follow Up Recommendations Home health PT    Equipment Recommendations  Rolling walker with 5" wheels    Recommendations for Other Services       Precautions / Restrictions Precautions Precautions: Fall Precaution Comments: pt reports 1 fall in past year Restrictions Weight Bearing Restrictions: No      Mobility  Bed Mobility Overal bed mobility: Modified Independent             General bed mobility comments: HOB up  Transfers Overall transfer level: Needs assistance Equipment used: None Transfers: Sit to/from [facility] Sit to Stand: Min guard Stand pivot transfers: Min guard       General transfer comment: pt reached for UE support on armrests of 3 in 1; HR up to 141 with transfer, 120 at rest  Ambulation/Gait             General Gait Details: deferred 2* tachycardia  Stairs            Wheelchair Mobility    Modified Rankin (Stroke Patients Only)        Balance Overall balance assessment: History of Falls;Needs assistance Sitting-balance support: Feet supported;No upper extremity supported Sitting balance-Leahy Scale: Good     Standing balance support: Single extremity supported Standing balance-Leahy Scale: Fair                               Pertinent Vitals/Pain Pain Assessment: No/denies pain    Home Living Family/patient expects to be discharged to:: Private residence Living Arrangements: Spouse/significant other Available Help at Discharge: Family;Available 24 hours/day   Home Access: Level entry     Home Layout: Able to live on main level with bedroom/bathroom Home Equipment: None      Prior Function Level of Independence: Independent               Hand Dominance        Extremity/Trunk Assessment   Upper Extremity Assessment Upper Extremity Assessment: Overall WFL for tasks assessed    Lower Extremity Assessment Lower Extremity Assessment: Overall WFL for tasks assessed    Cervical / Trunk Assessment Cervical / Trunk Assessment: Kyphotic  Communication   Communication: No difficulties  Cognition Arousal/Alertness: Awake/alert Behavior During Therapy: WFL for tasks assessed/performed Overall Cognitive Status: No family/caregiver present to determine baseline cognitive functioning                                 General Comments: decreased safety  awareness, impulsive, decreased awareness of deficits (pt stated he's ready to go home, denied that his HR went up with transfers)      General Comments      Exercises     Assessment/Plan    PT Assessment Patient needs continued PT services  PT Problem List Cardiopulmonary status limiting activity;Decreased activity tolerance;Decreased balance;Decreased mobility       PT Treatment Interventions Gait training;Therapeutic exercise;Therapeutic activities;Patient/family education;Balance training;Functional mobility  training    PT Goals (Current goals can be found in the Care Plan section)  Acute Rehab PT Goals Patient Stated Goal: to go home PT Goal Formulation: With patient Time For Goal Achievement: 06/19/19 Potential to Achieve Goals: Good    Frequency Min 3X/week   Barriers to discharge        Co-evaluation               AM-PAC PT "6 Clicks" Mobility  Outcome Measure Help needed turning from your back to your side while in a flat bed without using bedrails?: A Little Help needed moving from lying on your back to sitting on the side of a flat bed without using bedrails?: A Little Help needed moving to and from a bed to a chair (including a wheelchair)?: A Little Help needed standing up from a chair using your arms (e.g., wheelchair or bedside chair)?: A Little Help needed to walk in hospital room?: A Lot Help needed climbing 3-5 steps with a railing? : A Lot 6 Click Score: 16    End of Session Equipment Utilized During Treatment: Gait belt Activity Tolerance: Treatment limited secondary to medical complications (Comment)(tachycardia) Patient left: in bed;with bed alarm set;with call bell/phone within reach Nurse Communication: Mobility status PT Visit Diagnosis: Difficulty in walking, not elsewhere classified (R26.2);History of falling (Z91.81)    Time: [id number] PT Time Calculation (min) (ACUTE ONLY): 16 min   Charges:   PT Evaluation $PT Eval Low Complexity: 1 Low          Blondell Reveal Kistler PT 06/05/2019  Acute Rehabilitation Services Pager [phone] Office [phone]

## 2019-06-05 NOTE — Progress Notes (Signed)
PROGRESS NOTE    Billy [name]  [id number] DOB: [birth year] DOA: 06/04/2019 PCP: [name], MD  Outpatient Specialists:   Brief Narrative:  Patient is a 73 year old Caucasian male with past medical history significant for alcohol abuse, portal hypertension with varices, ascites, angiodysplasia of the colon status post APC treatment in the past, history of diverticulosis of colon, GERD and previous GI bleed,  depression and recent diagnosis of right lower extremity DVT on anticoagulation.  Patient was discharged from the hospital on 05/31/2019 after inpatient and careful upper GI bleed from severe erosive esophagitis, acute kidney injury and possibly hepatic encephalopathy.  Patient presents again with rectal bleed.  Patient reports bright red color blood per rectum.  Serum creatinine of 1.75 also noted on presentation, with baseline serum creatinine of 1.3.  GI team is directing care.  Patient is currently on IV Protonix 40 mg twice daily, sucralfate and IV Rocephin for SBP prophylaxis.  Option of IVC filter placement have been broached.  Further management depend on hospital course.  Assessment & Plan:   Principal Problem:   GI bleed Active Problems:   Essential hypertension, benign   Portal hypertensive gastropathy (HCC)   Iron deficiency anemia due to chronic blood loss   Angiodysplasia of colon   Alcoholic cirrhosis of liver (HCC)   Hydrocele in adult   Right leg DVT (HCC)   Chronic renal disease, stage III   GI bleed: Continue to monitor supportively. Monitor vital signs closely Monitor H/H GI input is appreciated Continue Protonix and sucralfate Query role of colonoscopy -EGD done on previous admission reveals grade D esophagitis, esophageal varices, portal hypertensive gastropathy and angiodysplasia.  Acute kidney injury on chronic kidney disease stage III: Likely prerenal Continue hydration. Monitor renal function and electrolytes  Right lower extremity  DVT: -Reported to be extensive -Patient was on Eliquis prior to presentation, but now has GI bleed. -Eliquis is on hold. -For possible IVC filter placement  Liver cirrhosis, alcoholic: Patient denies current use of alcohol.  Hyponatremia: Possibly multifactorial, including prerenal and liver disease Continue to monitor level.   DVT prophylaxis: SCD Code Status: Full code Family Communication:  Disposition Plan: Home eventually   Consultants:   Gastroenterology  Procedures:   None  Antimicrobials:   IV Rocephin   Subjective: -No new complaints -Intermittent rectal bleed  Objective: Vitals:   06/05/19 0400 06/05/19 0408 06/05/19 0500 06/05/19 0808  BP: 140/67     Pulse: 98     Resp: 13     Temp:  98.5 F (36.9 C)  98 F (36.7 C)  TempSrc:  Oral  Oral  SpO2: 98%     Weight:   78.1 kg   Height:        Intake/Output Summary (Last 24 hours) at 06/05/2019 1014 Last data filed at 06/05/2019 0400 Gross per 24 hour  Intake 1291.07 ml  Output -  Net 1291.07 ml   Filed Weights   06/04/19 1130 06/04/19 1750 06/05/19 0500  Weight: 75 kg 75.3 kg 78.1 kg    Examination:  General exam: Appears calm and comfortable.  Cachectic. Respiratory system: Clear to auscultation.  Cardiovascular system: S1 & S2 heard, tachycardic  Gastrointestinal system: Abdomen is nondistended, soft and nontender. No organomegaly or masses felt. Normal bowel sounds heard. Central nervous system: Alert and oriented.  Patient moves all extremities  Data Reviewed: I have personally reviewed following labs and imaging studies  CBC: Recent Labs  Lab 05/30/19 0524  05/31/19 0453 06/03/19 1210 06/04/19  1150 06/04/19 2042 06/05/19 0159 06/05/19 0853  WBC 6.3  --  6.2 7.5 12.5*  --  10.0  --   NEUTROABS  --   --   --  6.3 10.6*  --   --   --   HGB 7.8*   < > 7.5* 7.4 Repeated and verified X2.* 7.6* 7.4* 7.5*  7.7* 8.2*  HCT 25.3*   < > 24.7* 22.8 Repeated and verified X2.* 25.3*  24.4* 24.6*  25.1* 26.7*  MCV 98.1  --  99.2 94.3 100.0  --  96.2  --   PLT 143*  --  131* 177.0 225  --  146*  --    < > = values in this interval not displayed.   Basic Metabolic Panel: Recent Labs  Lab 05/29/19 1823 05/30/19 0524 05/31/19 0453 06/03/19 1210 06/04/19 1150 06/05/19 0159  NA 132* 126* 132* 127* 128* 130*  K 4.1 4.0 4.2 4.5 3.8 4.0  CL 99 98 104 99 97* 102  CO2 23 21* 21* 21 19* 18*  GLUCOSE 112* 110* 127* 170* 107* 127*  BUN 42* 38* 31* 28* 38* 35*  CREATININE 1.88* 1.61* 1.59* 1.50 1.89* 1.75*  CALCIUM 8.1* 7.6* 7.6* 8.3* 8.3* 8.1*  MG 1.9 2.0  --   --   --  2.0  PHOS 3.8  --   --   --   --   --    GFR: Estimated Creatinine Clearance: 42.8 mL/min (A) (by C-G formula based on SCr of 1.75 mg/dL (H)). Liver Function Tests: Recent Labs  Lab 05/29/19 1823 05/30/19 0524 05/31/19 0453 06/04/19 1150 06/05/19 0159  AST 27 24 26  36 35  ALT 18 16 17 22 21   ALKPHOS 70 67 64 85 79  BILITOT 1.1 0.7 0.4 1.0 0.4  PROT 5.6* 5.4* 5.2* 6.1* 5.3*  ALBUMIN 2.2* 2.2* 2.0* 2.6* 2.1*   No results for input(s): LIPASE, AMYLASE in the last 168 hours. No results for input(s): AMMONIA in the last 168 hours. Coagulation Profile: Recent Labs  Lab 05/29/19 1823 06/05/19 0159  INR 1.6* 1.7*   Cardiac Enzymes: No results for input(s): CKTOTAL, CKMB, CKMBINDEX, TROPONINI in the last 168 hours. BNP (last 3 results) No results for input(s): PROBNP in the last 8760 hours. HbA1C: No results for input(s): HGBA1C in the last 72 hours. CBG: No results for input(s): GLUCAP in the last 168 hours. Lipid Profile: No results for input(s): CHOL, HDL, LDLCALC, TRIG, CHOLHDL, LDLDIRECT in the last 72 hours. Thyroid Function Tests: No results for input(s): TSH, T4TOTAL, FREET4, T3FREE, THYROIDAB in the last 72 hours. Anemia Panel: No results for input(s): VITAMINB12, FOLATE, FERRITIN, TIBC, IRON, RETICCTPCT in the last 72 hours. Urine analysis:    Component Value Date/Time    COLORURINE AMBER (A) 01/06/2012 0855   APPEARANCEUR CLEAR 01/06/2012 0855   LABSPEC 1.029 01/06/2012 0855   PHURINE 6.0 01/06/2012 0855   GLUCOSEU NEGATIVE 01/06/2012 0855   HGBUR NEGATIVE 01/06/2012 0855   BILIRUBINUR SMALL (A) 01/06/2012 0855   KETONESUR TRACE (A) 01/06/2012 0855   PROTEINUR NEGATIVE 01/06/2012 0855   UROBILINOGEN 0.2 01/06/2012 0855   NITRITE NEGATIVE 01/06/2012 0855   LEUKOCYTESUR NEGATIVE 01/06/2012 0855   Sepsis Labs: @LABRCNTIP (procalcitonin:4,lacticidven:4)  ) Recent Results (from the past 240 hour(s))  SARS CORONAVIRUS 2 (TAT 6-24 HRS) Nasopharyngeal Nasopharyngeal Swab     Status: None   Collection Time: 05/28/19  1:45 PM   Specimen: Nasopharyngeal Swab  Result Value Ref Range Status   SARS Coronavirus  2 NEGATIVE NEGATIVE Final    Comment: (NOTE) SARS-CoV-2 target nucleic acids are NOT DETECTED. The SARS-CoV-2 RNA is generally detectable in upper and lower respiratory specimens during the acute phase of infection. Negative results do not preclude SARS-CoV-2 infection, do not rule out co-infections with other pathogens, and should not be used as the sole basis for treatment or other patient management decisions. Negative results must be combined with clinical observations, patient history, and epidemiological information. The expected result is Negative. Fact Sheet for Patients: SugarRoll.be Fact Sheet for Healthcare Providers: https://www.woods-mathews.com/ This test is not yet approved or cleared by the [place] FDA and  has been authorized for detection and/or diagnosis of SARS-CoV-2 by FDA under an Emergency Use Authorization (EUA). This EUA will remain  in effect (meaning this test can be used) for the duration of the COVID-19 declaration under Section 56 4(b)(1) of the Act, 21 U.S.C. section 360bbb-3(b)(1), unless the authorization is terminated or revoked sooner. Performed at [facility], [place] [address] [place], [place] [address]   Body fluid culture     Status: None   Collection Time: 05/30/19 11:38 AM   Specimen: PATH Cytology Peritoneal fluid  Result Value Ref Range Status   Specimen Description   Final    PERITONEAL Performed at [facility] [address], [place], [place] [address]    Special Requests   Final    NONE Performed at [facility], [place] [address], [place], [place] [address]    Gram Stain NO WBC SEEN NO ORGANISMS SEEN   Final   Culture   Final    NO GROWTH 3 DAYS Performed at [facility], [place] [address] [place], [place] [address]    Report Status 06/02/2019 FINAL  Final  Culture, body fluid-bottle     Status: None   Collection Time: 05/30/19 11:38 AM   Specimen: Peritoneal Washings  Result Value Ref Range Status   Specimen Description PERITONEAL  Final   Special Requests NONE  Final   Culture   Final    NO GROWTH 5 DAYS Performed at [facility], [address], [place], [place] [address]    Report Status 06/04/2019 FINAL  Final  Gram stain     Status: None   Collection Time: 05/30/19 11:38 AM   Specimen: Peritoneal Washings  Result Value Ref Range Status   Specimen Description PERITONEAL  Final   Special Requests NONE  Final   Gram Stain   Final    RARE WBC PRESENT, PREDOMINANTLY MONONUCLEAR NO ORGANISMS SEEN Performed at [facility], [place] [address] [place], [place] [address]    Report Status 05/31/2019 FINAL  Final  SARS CORONAVIRUS 2 (TAT 6-24 HRS) Nasopharyngeal Nasopharyngeal Swab     Status: None   Collection Time: 06/04/19  1:57 PM   Specimen: Nasopharyngeal Swab  Result Value Ref Range Status   SARS Coronavirus 2 NEGATIVE NEGATIVE Final    Comment: (NOTE) SARS-CoV-2 target nucleic acids are NOT DETECTED. The SARS-CoV-2 RNA is generally detectable in upper and lower respiratory specimens during the acute phase of infection. Negative results do not preclude  SARS-CoV-2 infection, do not rule out co-infections with other pathogens, and should not be used as the sole basis for treatment or other patient management decisions. Negative results must be combined with clinical observations, patient history, and epidemiological information. The expected result is Negative. Fact Sheet for Patients: SugarRoll.be Fact Sheet for Healthcare Providers: https://www.woods-mathews.com/  This test is not yet approved or cleared by the [place] and  has been authorized for detection and/or diagnosis of SARS-CoV-2 by FDA under an Emergency Use Authorization (EUA). This EUA will remain  in effect (meaning this test can be used) for the duration of the COVID-19 declaration under Section 56 4(b)(1) of the Act, 21 U.S.C. section 360bbb-3(b)(1), unless the authorization is terminated or revoked sooner. Performed at [facility], [place] [address], [place], [place] [address]   MRSA PCR Screening     Status: None   Collection Time: 06/04/19  6:13 PM   Specimen: Nasal Mucosa; Nasopharyngeal  Result Value Ref Range Status   MRSA by PCR NEGATIVE NEGATIVE Final    Comment:        The GeneXpert MRSA Assay (FDA approved for NASAL specimens only), is one component of a comprehensive MRSA colonization surveillance program. It is not intended to diagnose MRSA infection nor to guide or monitor treatment for MRSA infections. Performed at [facility], [place] [address], [place], [place] [address]          Radiology Studies: No results found.      Scheduled Meds: . buPROPion  150 mg Oral Daily  . Chlorhexidine Gluconate Cloth  6 each Topical Daily  . guaiFENesin  600 mg Oral BID  . lactulose  30 g Oral BID  . midodrine  2.5 mg Oral TID WC  . mirtazapine  45 mg Oral QHS  . pantoprazole (PROTONIX) IV  40 mg Intravenous Q12H  . sucralfate  1 g Oral Q6H   Continuous Infusions: . sodium  chloride 50 mL/hr at 06/05/19 0400  . sodium chloride Stopped (06/04/19 2203)  . cefTRIAXone (ROCEPHIN)  IV Stopped (06/04/19 1935)     LOS: 1 day    Time spent: 35 minutes.    Dana Allan, MD  [facility] Pager #: [phone] 7PM-7AM contact night coverage as above

## 2019-06-05 NOTE — Progress Notes (Addendum)
Patient ID: Billy [name], male   DOB: 1946-06-12, 73 y.o.   MRN: [id number]    Progress Note   Subjective   Day # 2 CC; melena/decompensated cirrhosis/severe esophagitis-on Eliquis  INR 1.7 Hemoglobin 8.2, after 1 unit yesterday  Eliquis on hold  Patient says he feels better today.  Wife at bedside He had 1 bowel movement early this morning but still appeared dark red to black, 2 bowel movements thereafter both appearing more brown and normal.     Objective   Vital signs in last 24 hours: Temp:  [98 F (36.7 C)-98.6 F (37 C)] 98.6 F (37 C) (11/21 1241) Pulse Rate:  [93-141] 141 (11/21 1437) Resp:  [13-32] 13 (11/21 0400) BP: (121-172)/(55-109) 140/67 (11/21 0400) SpO2:  [93 %-100 %] 98 % (11/21 0400) Weight:  [75.3 kg-78.1 kg] 78.1 kg (11/21 0500) Last BM Date: 06/04/19 General:    Older white male in NAD Heart:  Regular rate and rhythm; no murmurs Lungs: Respirations even and unlabored, lungs CTA bilaterally Abdomen:  Soft, nontense ascites Extremities:  Without edema. Neurologic:  Alert and oriented,  grossly normal neurologically. Psych:  Cooperative. Normal mood and affect.  Intake/Output from previous day: 11/20 0701 - 11/21 0700 In: 1291.1 [P.O.:720; I.V.:473.8; IV Piggyback:97.3] Out: -  Intake/Output this shift: No intake/output data recorded.  Lab Results: Recent Labs    06/03/19 1210 06/04/19 1150 06/04/19 2042 06/05/19 0159 06/05/19 0853  WBC 7.5 12.5*  --  10.0  --   HGB 7.4 Repeated and verified X2.* 7.6* 7.4* 7.5*  7.7* 8.2*  HCT 22.8 Repeated and verified X2.* 25.3* 24.4* 24.6*  25.1* 26.7*  PLT 177.0 225  --  146*  --    BMET Recent Labs    06/03/19 1210 06/04/19 1150 06/05/19 0159  NA 127* 128* 130*  K 4.5 3.8 4.0  CL 99 97* 102  CO2 21 19* 18*  GLUCOSE 170* 107* 127*  BUN 28* 38* 35*  CREATININE 1.50 1.89* 1.75*  CALCIUM 8.3* 8.3* 8.1*   LFT Recent Labs    06/05/19 0159  PROT 5.3*  ALBUMIN 2.1*  AST 35  ALT 21   ALKPHOS 79  BILITOT 0.4   PT/INR Recent Labs    06/05/19 0159  LABPROT 20.3*  INR 1.7*       Assessment / Plan:    19) 73 year old male with decompensated cirrhosis secondary to EtOH, readmitted with recurrent bleeding/melena in setting of Eliquis.  Patient known to have severe grade D esophagitis on EGD done 1 week ago and felt to be source of acute bleeding at that time.  Also with large esophageal varices, nonbleeding at time of EGD.  Patient has not had a significant bleed this admission, and do not think this represents variceal bleeding.  #2 anemia acute on chronic secondary to GI blood loss #3 refractory ascites-patient had been evaluated by IR as an outpatient earlier this month and is scheduled for TIPS next week.  Patient and wife asking about proceeding.  #4 acute kidney injury last admit-diuretics on hold #5 recent right lower extremity DVT diagnosed September 2020-repeat Doppler last week shows persistent extensive right lower extremity DVT and therefore resumed Eliquis.   Plan; continue serial hemoglobins transfuse for hemoglobin 7 or less IV PPI twice daily while hospitalized Carafate suspension 4 times daily Hold Eliquis Patient needs to be evaluated for IVC filter.  He is going to continue to have GI bleeding otherwise.  His esophagus may take weeks to heal.  He does not need to be in stepdown unit, can move to a regular bed Do not think repeat EGD will change management at this time, we have source for his GI blood loss commended on EGD 1 week ago, do not think needs colonoscopy at this time.  Will need to discuss timing of TIPS with Dr. Carlean Purl and IR.  He may be best served by rescheduling this a couple of weeks out.     Principal Problem:   GI bleed Active Problems:   Essential hypertension, benign   Portal hypertensive gastropathy (HCC)   Iron deficiency anemia due to chronic blood loss   Angiodysplasia of colon   Alcoholic cirrhosis of liver  (HCC)   Hydrocele in adult   Right leg DVT (HCC)   Chronic renal disease, stage III     LOS: 1 day   Amy EsterwoodPA-C  06/05/2019, 2:54 PM   I have discussed the case with the PA, and that is the plan I formulated. I personally interviewed and examined the patient.  CC: Melena  This is a complex and somewhat puzzling case.  I did not do his recent upper endoscopy, but findings reportedly were felt to explain the melena and acute blood loss anemia on top of his chronic anemia.  Upper endoscopy by Dr. Carlean Purl in June showed friable duodenal mucosa, which is another potential source.  On the endoscopy by Dr. Benson Norway last week, that duodenal finding was not described, but some nonbleeding AVMs were cauterized, but reportedly not felt to be the cause of bleeding.  Having not done the endoscopy myself and from the available photos, it is difficult to know for certain if the esophagitis was the cause of this bleeding.  It should be noted the patient has not had a colonoscopy since 2013, at which point Dr. 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  He was also noted to have rectal varices/hemorrhoids, which is expected with his degree of cirrhosis.  He described the episode of bleeding yesterday as being a dark red to black, so I suspect it may have been hemorrhoidal.  Regarding the recurrent bleeding on oral anticoagulation, I agree he needs consideration of IVC filter to prevent pulmonary embolism while we are sorting out the source and solution to his recurrent bleeding. Thought he was scheduled for a TIPS this coming week, but in fact he is scheduled for a CT/BRTO protocol after the recent telemedicine appointment with Dr. Annamaria Boots of IR, in order to plan the feasibility of TIPS.  I think we should proceed as follows:  Consult interventional  radiology to consider moving up the CT/B RTO with their planned low-dose contrast protocol due to his CKD.  This will help determine the patient's candidacy for TIPS.  Ask interventional radiology their opinion regarding IVC filter placement in this patient.  Even if TIPS is feasible, it cannot necessarily be done immediately, putting patient at risk for pulmonary embolism with the known DVT and inability to take oral anticoagulation because of recurrent bleeding.  Tomorrow I will see the patient and discuss his willingness to undergo a colonoscopy on Monday with Dr. Fuller Plan.  Total time 40 minutes  Nelida Meuse III Office: [phone]

## 2019-06-06 DIAGNOSIS — N1832 Chronic kidney disease, stage 3b: Secondary | ICD-10-CM

## 2019-06-06 LAB — CBC WITH DIFFERENTIAL/PLATELET
Abs Immature Granulocytes: 0.11 10*3/uL — ABNORMAL HIGH (ref 0.00–0.07)
Basophils Absolute: 0.1 10*3/uL (ref 0.0–0.1)
Basophils Relative: 1 %
Eosinophils Absolute: 0 10*3/uL (ref 0.0–0.5)
Eosinophils Relative: 0 %
HCT: 24.9 % — ABNORMAL LOW (ref 39.0–52.0)
Hemoglobin: 7.5 g/dL — ABNORMAL LOW (ref 13.0–17.0)
Immature Granulocytes: 1 %
Lymphocytes Relative: 6 %
Lymphs Abs: 0.7 10*3/uL (ref 0.7–4.0)
MCH: 30.1 pg (ref 26.0–34.0)
MCHC: 30.1 g/dL (ref 30.0–36.0)
MCV: 100 fL (ref 80.0–100.0)
Monocytes Absolute: 1.1 10*3/uL — ABNORMAL HIGH (ref 0.1–1.0)
Monocytes Relative: 10 %
Neutro Abs: 9 10*3/uL — ABNORMAL HIGH (ref 1.7–7.7)
Neutrophils Relative %: 82 %
Platelets: 158 10*3/uL (ref 150–400)
RBC: 2.49 MIL/uL — ABNORMAL LOW (ref 4.22–5.81)
RDW: 18.4 % — ABNORMAL HIGH (ref 11.5–15.5)
WBC: 10.9 10*3/uL — ABNORMAL HIGH (ref 4.0–10.5)
nRBC: 0.6 % — ABNORMAL HIGH (ref 0.0–0.2)

## 2019-06-06 LAB — RENAL FUNCTION PANEL
Albumin: 2 g/dL — ABNORMAL LOW (ref 3.5–5.0)
Anion gap: 9 (ref 5–15)
BUN: 37 mg/dL — ABNORMAL HIGH (ref 8–23)
CO2: 16 mmol/L — ABNORMAL LOW (ref 22–32)
Calcium: 8.1 mg/dL — ABNORMAL LOW (ref 8.9–10.3)
Chloride: 104 mmol/L (ref 98–111)
Creatinine, Ser: 1.85 mg/dL — ABNORMAL HIGH (ref 0.61–1.24)
GFR calc Af Amer: 42 mL/min — ABNORMAL LOW (ref >60)
GFR calc non Af Amer: 36 mL/min — ABNORMAL LOW (ref >60)
Glucose, Bld: 106 mg/dL — ABNORMAL HIGH (ref 70–99)
Phosphorus: 3.6 mg/dL (ref 2.5–4.6)
Potassium: 3.9 mmol/L (ref 3.5–5.1)
Sodium: 129 mmol/L — ABNORMAL LOW (ref 135–145)

## 2019-06-06 LAB — HEMOGLOBIN AND HEMATOCRIT, BLOOD
HCT: 24.8 % — ABNORMAL LOW (ref 39.0–52.0)
HCT: 26.6 % — ABNORMAL LOW (ref 39.0–52.0)
HCT: 26.9 % — ABNORMAL LOW (ref 39.0–52.0)
Hemoglobin: 7.6 g/dL — ABNORMAL LOW (ref 13.0–17.0)
Hemoglobin: 8.1 g/dL — ABNORMAL LOW (ref 13.0–17.0)
Hemoglobin: 8.3 g/dL — ABNORMAL LOW (ref 13.0–17.0)

## 2019-06-06 LAB — SODIUM, URINE, RANDOM: Sodium, Ur: <10 mmol/L

## 2019-06-06 LAB — OSMOLALITY, URINE: Osmolality, Ur: 452 mOsm/kg (ref 300–900)

## 2019-06-06 MED ORDER — PEG-KCL-NACL-NASULF-NA ASC-C 100 G PO SOLR
0.5000 | Freq: Once | ORAL | Status: AC
  Administered 2019-06-06: 100 g via ORAL

## 2019-06-06 MED ORDER — PEG-KCL-NACL-NASULF-NA ASC-C 100 G PO SOLR: 1.0000 | Freq: Once | ORAL | Status: DC

## 2019-06-06 MED ORDER — PEG-KCL-NACL-NASULF-NA ASC-C 100 G PO SOLR
0.5000 | Freq: Once | ORAL | Status: AC
  Administered 2019-06-06: 100 g via ORAL
  Filled 2019-06-06 (×2): qty 1

## 2019-06-06 NOTE — H&P (View-Only) (Signed)
Patient ID: Billy [name], male   DOB: Jul 14, 1946, 73 y.o.   MRN: [id number]    Progress Note   Subjective   Day # 3 CC; Gi bleeding recurrent in setting of Eliquis  hgb 7.6 stable  BUN 37/ creat 1.85  Rocephin IV PPI twice daily  Patient a bit frustrated today and tearful about upcoming procedures and evaluation.  No further melena today.  No complaints of abdominal pain says he feels fine and wishes he can go home    Objective   Vital signs in last 24 hours: Temp:  [97.9 F (36.6 C)-98.6 F (37 C)] 97.9 F (36.6 C) (11/22 1236) Pulse Rate:  [100-141] 105 (11/22 1300) Resp:  [12-26] 18 (11/22 1300) BP: (113-165)/(51-80) 150/71 (11/22 1300) SpO2:  [93 %-100 %] 100 % (11/22 1300) Weight:  [80.5 kg] 80.5 kg (11/22 0448) Last BM Date: 06/05/19 General:    white male in NAD chronically ill-appearing Heart:  Regular rate and rhythm; no murmurs Lungs: Respirations even and unlabored, lungs CTA bilaterally Abdomen:  Soft, significant ascites/nontense nontender Extremities:  Without edema. Neurologic:  Alert and oriented,  grossly normal neurologically.  Mentating well no asterixis Psych:  Cooperative. Normal mood and affect.  Intake/Output from previous day: 11/21 0701 - 11/22 0700 In: 875.7 [I.V.:775.7; IV Piggyback:100] Out: -  Intake/Output this shift: No intake/output data recorded.  Lab Results: Recent Labs    06/04/19 1150  06/05/19 0159  06/05/19 2000 06/06/19 0157 06/06/19 0922  WBC 12.5*  --  10.0  --   --  10.9*  --   HGB 7.6*   < > 7.5*  7.7*   < > 8.0* 7.5* 7.6*  HCT 25.3*   < > 24.6*  25.1*   < > 25.8* 24.9* 24.8*  PLT 225  --  146*  --   --  158  --    < > = values in this interval not displayed.   BMET Recent Labs    06/04/19 1150 06/05/19 0159 06/06/19 0157  NA 128* 130* 129*  K 3.8 4.0 3.9  CL 97* 102 104  CO2 19* 18* 16*  GLUCOSE 107* 127* 106*  BUN 38* 35* 37*  CREATININE 1.89* 1.75* 1.85*  CALCIUM 8.3* 8.1* 8.1*   LFT Recent  Labs    06/05/19 0159 06/06/19 0157  PROT 5.3*  --   ALBUMIN 2.1* 2.0*  AST 35  --   ALT 21  --   ALKPHOS 79  --   BILITOT 0.4  --    PT/INR Recent Labs    06/05/19 0159  LABPROT 20.3*  INR 1.7*         Assessment / Plan:    #29 73 year old male with decompensated cirrhosis secondary to EtOH admitted with recurrent melena in setting of Eliquis.  M ELD 18 range  EGD done 1 week ago during previous hospitalization showed grade D severe circumferential esophagitis felt to be source of acute bleeding but also noted to have large esophageal varices felt nonbleeding at that time.  Patient resumed Eliquis on discharge and developed recurrent melena with mild drop in hemoglobin  No active bleeding over the past 24 hours and hemoglobin stable  Etiology of recurrent bleeding not entirely clear.  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  Plan now is for patient to have colonoscopy and EGD with Dr. Fuller Plan tomorrow.  Prep this evening.  Had a long talk with the patient today regarding his multiple medical issues.  We discussed  indication for colonoscopy and EGD in detail including indications risks and benefits and he is agreeable to proceed.  #2 recent right lower extremity DVT for which she had been on Eliquis-repeat lower Doppler last week showed persistent extensive right lower extremity DVT  Have asked IR to evaluate regarding IVC filter placement  #3 acute kidney injury-last admit diuretics on hold since  #4 anemia acute on chronic secondary to GI bleeding  #5 refractory ascites-patient unable to tolerate much diuresis and diuretics again currently on hold due to acute kidney injury Patient had undergone initial IR consultation with Dr. Reesa Chew as outpatient 05/26/2019.  Patient was scheduled for CTB RTO/TIPS protocol with reduced IV contrast this coming week to assess anatomy for TIPS  We will ask IR to see tomorrow, perhaps this can be done while he is inpatient    Principal Problem:   GI bleed Active Problems:   Essential hypertension, benign   Portal hypertensive gastropathy (HCC)   Iron deficiency anemia due to chronic blood loss   Angiodysplasia of colon   Alcoholic cirrhosis of liver (HCC)   Hydrocele in adult   Right leg DVT ([facility])   Chronic renal disease, stage III     LOS: 2 days   Amy EsterwoodPA-C  06/06/2019, 1:28 PM    I have discussed the case with the PA, and that is the plan I formulated. I also spoke with Dr. Annamaria Boots of IR to review the case  Dr. Carlean Purl updated, and Dr. Fuller Plan will assume consult service tomorrow for procedures on this patient.    Nelida Meuse III Office: [phone]

## 2019-06-06 NOTE — Progress Notes (Addendum)
Patient ID: Billy [name], male   DOB: [birth year], 73 y.o.   MRN: [id number]    Progress Note   Subjective   Day # 3 CC; Gi bleeding recurrent in setting of Eliquis  hgb 7.6 stable  BUN 37/ creat 1.85  Rocephin IV PPI twice daily  Patient a bit frustrated today and tearful about upcoming procedures and evaluation.  No further melena today.  No complaints of abdominal pain says he feels fine and wishes he can go home    Objective   Vital signs in last 24 hours: Temp:  [97.9 F (36.6 C)-98.6 F (37 C)] 97.9 F (36.6 C) (11/22 1236) Pulse Rate:  [100-141] 105 (11/22 1300) Resp:  [12-26] 18 (11/22 1300) BP: (113-165)/(51-80) 150/71 (11/22 1300) SpO2:  [93 %-100 %] 100 % (11/22 1300) Weight:  [80.5 kg] 80.5 kg (11/22 0448) Last BM Date: 06/05/19 General:    white male in NAD chronically ill-appearing Heart:  Regular rate and rhythm; no murmurs Lungs: Respirations even and unlabored, lungs CTA bilaterally Abdomen:  Soft, significant ascites/nontense nontender Extremities:  Without edema. Neurologic:  Alert and oriented,  grossly normal neurologically.  Mentating well no asterixis Psych:  Cooperative. Normal mood and affect.  Intake/Output from previous day: 11/21 0701 - 11/22 0700 In: 875.7 [I.V.:775.7; IV Piggyback:100] Out: -  Intake/Output this shift: No intake/output data recorded.  Lab Results: Recent Labs    06/04/19 1150  06/05/19 0159  06/05/19 2000 06/06/19 0157 06/06/19 0922  WBC 12.5*  --  10.0  --   --  10.9*  --   HGB 7.6*   < > 7.5*  7.7*   < > 8.0* 7.5* 7.6*  HCT 25.3*   < > 24.6*  25.1*   < > 25.8* 24.9* 24.8*  PLT 225  --  146*  --   --  158  --    < > = values in this interval not displayed.   BMET Recent Labs    06/04/19 1150 06/05/19 0159 06/06/19 0157  NA 128* 130* 129*  K 3.8 4.0 3.9  CL 97* 102 104  CO2 19* 18* 16*  GLUCOSE 107* 127* 106*  BUN 38* 35* 37*  CREATININE 1.89* 1.75* 1.85*  CALCIUM 8.3* 8.1* 8.1*   LFT Recent  Labs    06/05/19 0159 06/06/19 0157  PROT 5.3*  --   ALBUMIN 2.1* 2.0*  AST 35  --   ALT 21  --   ALKPHOS 79  --   BILITOT 0.4  --    PT/INR Recent Labs    06/05/19 0159  LABPROT 20.3*  INR 1.7*         Assessment / Plan:    #56 73 year old male with decompensated cirrhosis secondary to EtOH admitted with recurrent melena in setting of Eliquis.  M ELD 18 range  EGD done 1 week ago during previous hospitalization showed grade D severe circumferential esophagitis felt to be source of acute bleeding but also noted to have large esophageal varices felt nonbleeding at that time.  Patient resumed Eliquis on discharge and developed recurrent melena with mild drop in hemoglobin  No active bleeding over the past 24 hours and hemoglobin stable  Etiology of recurrent bleeding not entirely clear.  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  Plan now is for patient to have colonoscopy and EGD with Dr. Fuller Plan tomorrow.  Prep this evening.  Had a long talk with the patient today regarding his multiple medical issues.  We discussed  indication for colonoscopy and EGD in detail including indications risks and benefits and he is agreeable to proceed.  #2 recent right lower extremity DVT for which she had been on Eliquis-repeat lower Doppler last week showed persistent extensive right lower extremity DVT  Have asked IR to evaluate regarding IVC filter placement  #3 acute kidney injury-last admit diuretics on hold since  #4 anemia acute on chronic secondary to GI bleeding  #5 refractory ascites-patient unable to tolerate much diuresis and diuretics again currently on hold due to acute kidney injury Patient had undergone initial IR consultation with Dr. Reesa Chew as outpatient 05/26/2019.  Patient was scheduled for CTB RTO/TIPS protocol with reduced IV contrast this coming week to assess anatomy for TIPS  We will ask IR to see tomorrow, perhaps this can be done while he is inpatient    Principal Problem:   GI bleed Active Problems:   Essential hypertension, benign   Portal hypertensive gastropathy (HCC)   Iron deficiency anemia due to chronic blood loss   Angiodysplasia of colon   Alcoholic cirrhosis of liver (HCC)   Hydrocele in adult   Right leg DVT ([place])   Chronic renal disease, stage III     LOS: 2 days   Amy EsterwoodPA-C  06/06/2019, 1:28 PM    I have discussed the case with the PA, and that is the plan I formulated. I also spoke with Dr. Annamaria Boots of IR to review the case  Dr. Carlean Purl updated, and Dr. Fuller Plan will assume consult service tomorrow for procedures on this patient.    Nelida Meuse III Office: [phone]

## 2019-06-06 NOTE — Progress Notes (Addendum)
PROGRESS NOTE    Billy [name]  [id number] DOB: [birth year] DOA: 06/04/2019 PCP: [name], MD  Outpatient Specialists:   Brief Narrative:  Patient is a 73 year old Caucasian male with past medical history significant for alcohol abuse, portal hypertension with varices, ascites, angiodysplasia of the colon status post APC treatment in the past, history of diverticulosis of colon, GERD and previous GI bleed,  depression and recent diagnosis of right lower extremity DVT on anticoagulation.  Patient was discharged from the hospital on 05/31/2019 after inpatient and careful upper GI bleed from severe erosive esophagitis, acute kidney injury and possibly hepatic encephalopathy.  Patient presents again with rectal bleed.  Patient reports bright red color blood per rectum.  Serum creatinine of 1.75 also noted on presentation, with baseline serum creatinine of 1.3.  GI team is directing care.  Patient is currently on IV Protonix 40 mg twice daily, sucralfate and IV Rocephin for SBP prophylaxis.  Option of IVC filter placement have been broached.  Further management depend on hospital course.  06/06/2019: Patient seen.  No further bleeding reported.  Hemoglobin has remained stable at 7.5 g/dL.  GI input is appreciated.  Likely colonoscopy in the morning.  Assessment & Plan:   Principal Problem:   GI bleed Active Problems:   Essential hypertension, benign   Portal hypertensive gastropathy (HCC)   Iron deficiency anemia due to chronic blood loss   Angiodysplasia of colon   Alcoholic cirrhosis of liver (HCC)   Hydrocele in adult   Right leg DVT (HCC)   Chronic renal disease, stage III   GI bleed: Continue to monitor supportively. Monitor vital signs closely Monitor H/H GI input is appreciated Continue Protonix and sucralfate Query role of colonoscopy -EGD done on previous admission reveals grade D esophagitis, esophageal varices, portal hypertensive gastropathy and  angiodysplasia.  Acute kidney injury on chronic kidney disease stage III: Likely, patient has baseline chronic kidney disease stage IIIb.   Current acute kidney injury is likely prerenal Continue cautious hydration Monitor renal function and electrolytes  Right lower extremity DVT: -Reported to be extensive -Patient was on Eliquis prior to presentation, but now has GI bleed. -Eliquis is on hold. -For possible IVC filter placement  Liver cirrhosis, alcoholic: Patient denies current use of alcohol.  Hyponatremia: Likely multifactorial, including prerenal and liver disease Continue to monitor level. Sodium is 129 today.  Intermittent sinus tachycardia: This tends to occur when patient ambulates. Likely secondary to deconditioning and volume status. Continue cautious hydration. Consider physical therapy NP when patient is more stable.   DVT prophylaxis: SCD Code Status: Full code Family Communication:  Disposition Plan: Home eventually   Consultants:   Gastroenterology  Procedures:   None  Antimicrobials:   IV Rocephin   Subjective: -No new complaints -No bleeding reported.    Objective: Vitals:   06/06/19 0447 06/06/19 0448 06/06/19 0500 06/06/19 0600  BP: (!) 125/58  (!) 113/51 (!) 145/80  Pulse: (!) 133  (!) 116 (!) 112  Resp: (!) 26  15 16   Temp:      TempSrc:      SpO2: 98%  97% 98%  Weight:  80.5 kg    Height:        Intake/Output Summary (Last 24 hours) at 06/06/2019 0821 Last data filed at 06/05/2019 2100 Gross per 24 hour  Intake 875.69 ml  Output -  Net 875.69 ml   Filed Weights   06/04/19 1750 06/05/19 0500 06/06/19 0448  Weight: 75.3 kg 78.1 kg 80.5  kg    Examination:  General exam: Appears calm and comfortable.  Cachectic. Respiratory system: Clear to auscultation.  Cardiovascular system: S1 & S2 heard, tachycardic  Gastrointestinal system: Abdomen is nondistended, soft and nontender. No organomegaly or masses felt. Normal  bowel sounds heard. Central nervous system: Alert and oriented.  Patient moves all extremities  Data Reviewed: I have personally reviewed following labs and imaging studies  CBC: Recent Labs  Lab 05/31/19 0453 06/03/19 1210 06/04/19 1150  06/05/19 0159 06/05/19 0853 06/05/19 1514 06/05/19 2000 06/06/19 0157  WBC 6.2 7.5 12.5*  --  10.0  --   --   --  10.9*  NEUTROABS  --  6.3 10.6*  --   --   --   --   --  9.0*  HGB 7.5* 7.4 Repeated and verified X2.* 7.6*   < > 7.5*  7.7* 8.2* 8.1* 8.0* 7.5*  HCT 24.7* 22.8 Repeated and verified X2.* 25.3*   < > 24.6*  25.1* 26.7* 26.7* 25.8* 24.9*  MCV 99.2 94.3 100.0  --  96.2  --   --   --  100.0  PLT 131* 177.0 225  --  146*  --   --   --  158   < > = values in this interval not displayed.   Basic Metabolic Panel: Recent Labs  Lab 05/31/19 0453 06/03/19 1210 06/04/19 1150 06/05/19 0159 06/06/19 0157  NA 132* 127* 128* 130* 129*  K 4.2 4.5 3.8 4.0 3.9  CL 104 99 97* 102 104  CO2 21* 21 19* 18* 16*  GLUCOSE 127* 170* 107* 127* 106*  BUN 31* 28* 38* 35* 37*  CREATININE 1.59* 1.50 1.89* 1.75* 1.85*  CALCIUM 7.6* 8.3* 8.3* 8.1* 8.1*  MG  --   --   --  2.0  --   PHOS  --   --   --   --  3.6   GFR: Estimated Creatinine Clearance: 41.7 mL/min (A) (by C-G formula based on SCr of 1.85 mg/dL (H)). Liver Function Tests: Recent Labs  Lab 05/31/19 0453 06/04/19 1150 06/05/19 0159 06/06/19 0157  AST 26 36 35  --   ALT 17 22 21   --   ALKPHOS 64 85 79  --   BILITOT 0.4 1.0 0.4  --   PROT 5.2* 6.1* 5.3*  --   ALBUMIN 2.0* 2.6* 2.1* 2.0*   No results for input(s): LIPASE, AMYLASE in the last 168 hours. No results for input(s): AMMONIA in the last 168 hours. Coagulation Profile: Recent Labs  Lab 06/05/19 0159  INR 1.7*   Cardiac Enzymes: No results for input(s): CKTOTAL, CKMB, CKMBINDEX, TROPONINI in the last 168 hours. BNP (last 3 results) No results for input(s): PROBNP in the last 8760 hours. HbA1C: No results for  input(s): HGBA1C in the last 72 hours. CBG: No results for input(s): GLUCAP in the last 168 hours. Lipid Profile: No results for input(s): CHOL, HDL, LDLCALC, TRIG, CHOLHDL, LDLDIRECT in the last 72 hours. Thyroid Function Tests: Recent Labs    06/05/19 1210  TSH 2.194   Anemia Panel: No results for input(s): VITAMINB12, FOLATE, FERRITIN, TIBC, IRON, RETICCTPCT in the last 72 hours. Urine analysis:    Component Value Date/Time   COLORURINE AMBER (A) 01/06/2012 0855   APPEARANCEUR CLEAR 01/06/2012 0855   LABSPEC 1.029 01/06/2012 0855   PHURINE 6.0 01/06/2012 0855   GLUCOSEU NEGATIVE 01/06/2012 0855   HGBUR NEGATIVE 01/06/2012 0855   BILIRUBINUR SMALL (A) 01/06/2012 0855   Benjamin Stain  TRACE (A) 01/06/2012 0855   PROTEINUR NEGATIVE 01/06/2012 0855   UROBILINOGEN 0.2 01/06/2012 0855   NITRITE NEGATIVE 01/06/2012 0855   LEUKOCYTESUR NEGATIVE 01/06/2012 0855   Sepsis Labs: @LABRCNTIP (procalcitonin:4,lacticidven:4)  ) Recent Results (from the past 240 hour(s))  SARS CORONAVIRUS 2 (TAT 6-24 HRS) Nasopharyngeal Nasopharyngeal Swab     Status: None   Collection Time: 05/28/19  1:45 PM   Specimen: Nasopharyngeal Swab  Result Value Ref Range Status   SARS Coronavirus 2 NEGATIVE NEGATIVE Final    Comment: (NOTE) SARS-CoV-2 target nucleic acids are NOT DETECTED. The SARS-CoV-2 RNA is generally detectable in upper and lower respiratory specimens during the acute phase of infection. Negative results do not preclude SARS-CoV-2 infection, do not rule out co-infections with other pathogens, and should not be used as the sole basis for treatment or other patient management decisions. Negative results must be combined with clinical observations, patient history, and epidemiological information. The expected result is Negative. Fact Sheet for Patients: SugarRoll.be Fact Sheet for Healthcare Providers: https://www.woods-mathews.com/ This test is not  yet approved or cleared by the [place] FDA and  has been authorized for detection and/or diagnosis of SARS-CoV-2 by FDA under an Emergency Use Authorization (EUA). This EUA will remain  in effect (meaning this test can be used) for the duration of the COVID-19 declaration under Section 56 4(b)(1) of the Act, 21 U.S.C. section 360bbb-3(b)(1), unless the authorization is terminated or revoked sooner. Performed at [facility], [place] [address] [place], [place] [address]   Body fluid culture     Status: None   Collection Time: 05/30/19 11:38 AM   Specimen: PATH Cytology Peritoneal fluid  Result Value Ref Range Status   Specimen Description   Final    PERITONEAL Performed at [place] [address] [place], [place] [address]    Special Requests   Final    NONE Performed at [facility], [place] [address] [place], [place] [address]    Gram Stain NO WBC SEEN NO ORGANISMS SEEN   Final   Culture   Final    NO GROWTH 3 DAYS Performed at [facility], [place] [address] [place], [place] [address]    Report Status 06/02/2019 FINAL  Final  Culture, body fluid-bottle     Status: None   Collection Time: 05/30/19 11:38 AM   Specimen: Peritoneal Washings  Result Value Ref Range Status   Specimen Description PERITONEAL  Final   Special Requests NONE  Final   Culture   Final    NO GROWTH 5 DAYS Performed at [facility], [address], [place], [place] [address]    Report Status 06/04/2019 FINAL  Final  Gram stain     Status: None   Collection Time: 05/30/19 11:38 AM   Specimen: Peritoneal Washings  Result Value Ref Range Status   Specimen Description PERITONEAL  Final   Special Requests NONE  Final   Gram Stain   Final    RARE WBC PRESENT, PREDOMINANTLY MONONUCLEAR NO ORGANISMS SEEN Performed at [facility], [place] [address], [place], [place] [address]    Report Status 05/31/2019 FINAL  Final  SARS CORONAVIRUS 2  (TAT 6-24 HRS) Nasopharyngeal Nasopharyngeal Swab     Status: None   Collection Time: 06/04/19  1:57 PM   Specimen: Nasopharyngeal Swab  Result Value Ref Range Status   SARS Coronavirus 2 NEGATIVE NEGATIVE Final    Comment: (NOTE) SARS-CoV-2 target nucleic acids  are NOT DETECTED. The SARS-CoV-2 RNA is generally detectable in upper and lower respiratory specimens during the acute phase of infection. Negative results do not preclude SARS-CoV-2 infection, do not rule out co-infections with other pathogens, and should not be used as the sole basis for treatment or other patient management decisions. Negative results must be combined with clinical observations, patient history, and epidemiological information. The expected result is Negative. Fact Sheet for Patients: SugarRoll.be Fact Sheet for Healthcare Providers: https://www.woods-mathews.com/ This test is not yet approved or cleared by the [place] FDA and  has been authorized for detection and/or diagnosis of SARS-CoV-2 by FDA under an Emergency Use Authorization (EUA). This EUA will remain  in effect (meaning this test can be used) for the duration of the COVID-19 declaration under Section 56 4(b)(1) of the Act, 21 U.S.C. section 360bbb-3(b)(1), unless the authorization is terminated or revoked sooner. Performed at [facility], [place] [address] [place], [place] [address]   MRSA PCR Screening     Status: None   Collection Time: 06/04/19  6:13 PM   Specimen: Nasal Mucosa; Nasopharyngeal  Result Value Ref Range Status   MRSA by PCR NEGATIVE NEGATIVE Final    Comment:        The GeneXpert MRSA Assay (FDA approved for NASAL specimens only), is one component of a comprehensive MRSA colonization surveillance program. It is not intended to diagnose MRSA infection nor to guide or monitor treatment for MRSA infections. Performed at [facility], [place] [address] [place], [place] [address]          Radiology Studies: No results found.      Scheduled Meds: . buPROPion  150 mg Oral Daily  . Chlorhexidine Gluconate Cloth  6 each Topical Daily  . guaiFENesin  600 mg Oral BID  . lactulose  20 g Oral Daily  . midodrine  2.5 mg Oral TID WC  . mirtazapine  45 mg Oral QHS  . pantoprazole (PROTONIX) IV  40 mg Intravenous Q12H  . silver sulfADIAZINE   Topical BID  . sucralfate  1 g Oral Q6H   Continuous Infusions: . sodium chloride Stopped (06/04/19 2203)  . cefTRIAXone (ROCEPHIN)  IV Stopped (06/05/19 1754)     LOS: 2 days    Time spent: 35 minutes.    Dana Allan, MD  [facility] Pager #: [phone] 7PM-7AM contact night coverage as above

## 2019-06-06 NOTE — Progress Notes (Addendum)
Chief Complaint: Patient was seen in consultation today for RLE DVT  Referring Physician(s): Dr. Loletha Carrow  Supervising Physician: Daryll Brod  Patient Status: [facility] - In-pt  History of Present Illness: Billy [name] is a 73 y.o. male with past medical history of DVT, alcoholic liver cirrhosis, esophageal varices, GERD, and thrombocytopenia who is known to radiology from consultation 05/26/19 with Dr. Annamaria Boots for possible TIPS procedure and US Paracentesis x1 11/15. Patient was recently diagnosed with RLE DVT 03/30/19 and placed on Eliquis. Initially did well at home, however admitted for GI bleeding 11/13-11/16 and was found to have severe erosive esophagitis, mild PHG, small bowel AVMs which were cauterized in endo.  He was treated conservatively with PPI, carafate, blood transfusion, and supportive care with improvement. He continued anticoagulation at that time.  Now returns to [facility] 11/20 with melena and BRBPR at home.  Hgb on admission was 7.4.  His Eliquis has been held.  He was transfused 1u PRBC with improvement in Hgb to 8.1.   His RLE DVT was re-imaged 05/31/19 during his previous admission and showed ongoing deep vein thrombosis involving the right common femoral vein, right femoral vein, and right popliteal vein.  Request is now made for IVC filter placement at the request of Dr. Loletha Carrow.   Case reviewed and approved by Dr. Annamaria Boots.  Patient is known to radiology from recent evaluation for possible TIPS procedure.  Plan at that time was to proceed with CT/BRTO protocol.   Patient assessed this afternoon.  He reports feeling better since admission.  No further bleeding. Wife at bedside with several questions which are answered.    Past Medical History:  Diagnosis Date   Acute deep vein thrombosis (DVT) of femoral vein of right lower extremity ([place]) 03/30/2019   Acute posthemorrhagic anemia    Alcoholic cirrhosis of liver ([place]) 09/24/2011   Years of alcohol use. Progressed from fatty  liver to cirrhosis based upon review imaging. Manifestations are portal hypertension with esophageal varices, portal gastropathy, rectal varices and he may have portal colopathy. Ascites also present. Naive to HAV and HBV Claims he has had pneumococcal vaccine    Alcoholism ([place])    Angiodysplasia of colon 09/11/2011   Multiple in the right colon, ablated with APC. Question if this is portal colopathy.    Anxiety    Back pain    Basal cell carcinoma    Bilateral varicoceles    Campylobacter diarrhea 01/07/2012   Cataract    removed   DDD (degenerative disc disease), lumbar    Depression    mild at most   Diverticulosis of colon    Erectile dysfunction    Gastric ulcer 08/2011   small, antral ulcer   GERD (gastroesophageal reflux disease)    takes Omeprazole prn   GI hemorrhage 2006, 2013   esophagitis and 1+ varices, melena in 2013    Grade I diastolic dysfunction    Hearing loss    History of ascites    History of blood transfusion    as a child and no abnormal reaction noted   History of bronchitis    >34yrs ago   History of dizziness    Hydrocele    Hypertension    takes Nadolol daily   Narcotic dependence ([place])    Personal history of colonic polyps    Portal hypertension ([place]) 08/2011   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   Right hydrocele    Scrotal edema    Sleep disturbance    takes Remeron  nightly   Splenomegaly    Thrombocytopenia, unspecified ([place])    Varices, esophageal ([place]) 08/2011   Grade 2, mid-esophagus    Past Surgical History:  Procedure Laterality Date   BIOPSY  08/27/2018   Procedure: BIOPSY;  Surgeon: Milus Banister, MD;  Location: [facility];  Service: Endoscopy;;   CATARACT EXTRACTION  2013   bilateral with IOL   CHOLECYSTECTOMY  12/31/2012   Procedure: LAPAROSCOPIC CHOLECYSTECTOMY;  Surgeon: Zenovia Jarred, MD;  Location: [place];  Service: General;;   COLONOSCOPY  2001, 200411/02/2006   COLONOSCOPY  09/11/2011    Procedure: COLONOSCOPY;  Surgeon: Gatha Mayer, MD;  Location: [facility];  Service: Endoscopy;  Laterality: N/A;   COLONOSCOPY     ESOPHAGEAL BANDING N/A 05/02/2017   Procedure: ESOPHAGEAL BANDING;  Surgeon: Gatha Mayer, MD;  Location: [facility];  Service: Endoscopy;  Laterality: N/A;   ESOPHAGEAL BANDING  06/09/2018   Procedure: ESOPHAGEAL BANDING;  Surgeon: Gatha Mayer, MD;  Location: [facility];  Service: Endoscopy;;   ESOPHAGEAL BANDING  08/27/2018   Procedure: ESOPHAGEAL BANDING;  Surgeon: Milus Banister, MD;  Location: [facility];  Service: Endoscopy;;   ESOPHAGOGASTRODUODENOSCOPY  2006   ESOPHAGOGASTRODUODENOSCOPY  09/11/2011   Procedure: ESOPHAGOGASTRODUODENOSCOPY (EGD);  Surgeon: Gatha Mayer, MD;  [facility];  Service: Endoscopy;  Laterality: N/A;   ESOPHAGOGASTRODUODENOSCOPY N/A 08/27/2018   Procedure: ESOPHAGOGASTRODUODENOSCOPY (EGD);  Surgeon: Milus Banister, MD;  Location: [facility];  Service: Endoscopy;  Laterality: N/A;   ESOPHAGOGASTRODUODENOSCOPY (EGD) WITH PROPOFOL N/A 04/13/2017   Procedure: ESOPHAGOGASTRODUODENOSCOPY (EGD) WITH PROPOFOL;  Surgeon: Doran Stabler, MD;  Location: [facility];  Service: Gastroenterology;  Laterality: N/A;   ESOPHAGOGASTRODUODENOSCOPY (EGD) WITH PROPOFOL N/A 05/02/2017   Procedure: ESOPHAGOGASTRODUODENOSCOPY (EGD) WITH PROPOFOL;  Surgeon: Gatha Mayer, MD;  [facility]: [facility];  Service: Endoscopy;  Laterality: N/A;   ESOPHAGOGASTRODUODENOSCOPY (EGD) WITH PROPOFOL N/A 09/30/2017   Procedure: ESOPHAGOGASTRODUODENOSCOPY (EGD) WITH PROPOFOL;  Surgeon: Gatha Mayer, MD;  Location: [facility];  Service: Endoscopy;  Laterality: N/A;   ESOPHAGOGASTRODUODENOSCOPY (EGD) WITH PROPOFOL N/A 06/09/2018   Procedure: ESOPHAGOGASTRODUODENOSCOPY (EGD) WITH PROPOFOL;  Surgeon: Gatha Mayer, MD;  [facility]: [facility];  Service: Endoscopy;  Laterality: N/A;   ESOPHAGOGASTRODUODENOSCOPY (EGD) WITH  PROPOFOL N/A 01/05/2019   Procedure: ESOPHAGOGASTRODUODENOSCOPY (EGD) WITH PROPOFOL;  Surgeon: Gatha Mayer, MD;  Location: [facility];  Service: Endoscopy;  Laterality: N/A;   ESOPHAGOGASTRODUODENOSCOPY (EGD) WITH PROPOFOL N/A 05/30/2019   Procedure: ESOPHAGOGASTRODUODENOSCOPY (EGD) WITH PROPOFOL;  Surgeon: Carol Ada, MD;  Location: [facility];  Service: Endoscopy;  Laterality: N/A;   EUS N/A 08/27/2018   Procedure: UPPER ENDOSCOPIC ULTRASOUND (EUS) RADIAL;  Surgeon: Milus Banister, MD;  Location: [facility];  Service: Endoscopy;  Laterality: N/A;   EYE SURGERY     FLEXIBLE SIGMOIDOSCOPY  01/07/2012   Procedure: FLEXIBLE SIGMOIDOSCOPY;  Surgeon: Lafayette Dragon, MD;  Location: [facility];  Service: Endoscopy;  Laterality: N/A;   IR RADIOLOGIST EVAL & MGMT  05/26/2019   KNEE ARTHROSCOPY  2008   Right   KNEE ARTHROSCOPY  2008   Left   POLYPECTOMY     [place] THORA/PARACENTESIS  10/28/2011   Varicocele repair  1980's    Allergies: Yellow jacket venom and Nsaids  Medications: Prior to Admission medications   Medication Sig Start Date End Date Taking? Authorizing Provider  apixaban (ELIQUIS) 5 MG TABS tablet Take 1 tablet (5 mg total) by mouth 2 (two) times daily. 03/31/19  Yes Barton Dubois,  MD  buPROPion (WELLBUTRIN SR) 150 MG 12 hr tablet TAKE 1 TABLET BY MOUTH EVERY DAY 06/01/19  Yes Viviana Simpler I, MD  ferrous sulfate 325 (65 FE) MG EC tablet Take 325 mg by mouth every other day.   Yes [provider]  fluocinonide cream (LIDEX) [id number] % Apply 1 application topically 2 (two) times daily as needed (for skin rash/irritation). APPLY ON THE SKIN TWICE A DAY AS NEEDED FOR RASH 12/30/18  Yes [name], MD  HYDROcodone-acetaminophen (NORCO/VICODIN) 5-325 MG tablet Take 1 tablet by mouth 3 (three) times daily as needed (for pain.). 05/27/19  Yes [name], MD  lactulose (CHRONULAC) 10 GM/15ML solution Take 45 mLs (30 g total) by mouth 2 (two) times  daily. 05/31/19  Yes Harold Hedge, MD  midodrine (PROAMATINE) 2.5 MG tablet Take 1 tablet (2.5 mg total) by mouth 3 (three) times daily with meals. 05/11/19  Yes Gatha Mayer, MD  mirtazapine (REMERON) 45 MG tablet TAKE 1 TABLET (45 MG TOTAL) BY MOUTH AT BEDTIME. 05/12/19  Yes [name], MD  Multiple Vitamin (MULTIVITAMIN WITH MINERALS) TABS Take 1 tablet by mouth daily.    Yes [provider]  psyllium (METAMUCIL) 58.6 % powder Take 1 packet by mouth at bedtime.    Yes [provider]  sucralfate (CARAFATE) 1 GM/10ML suspension Take 10 mLs (1 g total) by mouth 3 (three) times daily between meals for 7 days. 05/31/19 06/07/19 Yes Harold Hedge, MD  vitamin C (ASCORBIC ACID) 250 MG tablet Take 250 mg by mouth daily.    Yes [provider]  pantoprazole (PROTONIX) 40 MG tablet Take 1 tablet (40 mg total) by mouth 2 (two) times daily for 30 days, THEN 1 tablet (40 mg total) daily. Patient not taking: Reported on 06/04/2019 05/31/19 07/30/19  Harold Hedge, MD  spironolactone (ALDACTONE) 50 MG tablet Take 200 mg by mouth daily. 06/01/19   [provider]     Family History  Problem Relation Age of Onset   Stroke Mother    Diabetes Mother    Lung cancer Brother    Prostate cancer Brother    Liver cancer Cousin    Liver cancer Paternal Uncle    Colon cancer Neg Hx    Stomach cancer Neg Hx    Esophageal cancer Neg Hx    Colon polyps Neg Hx    Rectal cancer Neg Hx    Pancreatic cancer Neg Hx     Social History   Socioeconomic History   Marital status: Married    Spouse name: Not on file   Number of children: 1   Years of education: Not on file   Highest education level: Not on file  Occupational History   Occupation: Retired    Comment: mostly from Engineer, 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   Food insecurity    Worry: Not on file    Inability: Not on Lexicographer needs    Medical:  Not on file    Non-medical: Not on file  Tobacco Use   Smoking status: Light Tobacco Smoker    Packs/day: 0.50    Types: Cigarettes, E-cigarettes   Smokeless tobacco: Never Used  Substance and Sexual Activity   Alcohol use: Not Currently    Alcohol/week: 0.0 standard drinks   Drug use: Yes    Types: Marijuana    Comment: occasionally, last time couple of weeks ago   Sexual  activity: Never  Lifestyle   Physical activity    Days per week: Not on file    Minutes per session: Not on file   Stress: Not on file  Relationships   Social connections    Talks on phone: Not on file    Gets together: Not on file    Attends religious service: Not on file    Active member of club or organization: Not on file    Attends meetings of clubs or organizations: Not on file    Relationship status: Not on file  Other Topics Concern   Not on file  Social History Narrative   Married- 2nd   1 son (adopted) from previous marriage      Has living will   Wife is  health care POA---alternate is son   Would accept attempts at resuscitation   No tube feeds if cognitively unaware        Review of Systems: A 12 point ROS discussed and pertinent positives are indicated in the HPI above.  All other systems are negative.  Review of Systems  Constitutional: Negative for fatigue and fever.  Respiratory: Negative for cough and shortness of breath.   Cardiovascular: Negative for chest pain.  Gastrointestinal: Positive for blood in stool and nausea. Negative for abdominal pain and vomiting.  Genitourinary: Negative for dysuria.  Musculoskeletal: Negative for back pain.  Psychiatric/Behavioral: Negative for behavioral problems and confusion.    Vital Signs: BP (!) 150/71    Pulse (!) 105    Temp 97.9 F (36.6 C) (Oral)    Resp 18    Ht 6\' 3"  (1.905 m)    Wt 177 lb 7.5 oz (80.5 kg)    SpO2 100%    BMI 22.18 kg/m   Physical Exam Vitals signs and nursing note reviewed.  Constitutional:       General: He is not in acute distress.    Appearance: Normal appearance. He is not ill-appearing.  HENT:     Mouth/Throat:     Mouth: Mucous membranes are moist.     Pharynx: Oropharynx is clear.  Neck:     Musculoskeletal: Normal range of motion and neck supple.  Cardiovascular:     Rate and Rhythm: Normal rate and regular rhythm.  Pulmonary:     Effort: Pulmonary effort is normal. No respiratory distress.     Breath sounds: Normal breath sounds.  Abdominal:     General: Abdomen is flat.     Palpations: Abdomen is soft.  Skin:    General: Skin is warm and dry.  Neurological:     General: No focal deficit present.     Mental Status: He is alert and oriented to person, place, and time. Mental status is at baseline.  Psychiatric:        Mood and Affect: Mood normal.        Behavior: Behavior normal.        Thought Content: Thought content normal.        Judgment: Judgment normal.      MD Evaluation Airway: WNL Heart: WNL Abdomen: WNL Chest/ Lungs: WNL ASA  Classification: 3 Mallampati/Airway Score: One   Imaging: Ct Head Wo Contrast  Result Date: 05/28/2019 CLINICAL DATA:  Altered mental status 1 month. Incontinent since yesterday. EXAM: CT HEAD WITHOUT CONTRAST TECHNIQUE: Contiguous axial images were obtained from the base of the skull through the vertex without intravenous contrast. COMPARISON:  None. FINDINGS: Brain: Ventricles, cisterns and other CSF spaces are normal.  There is minimal chronic ischemic microvascular disease. There is no mass, mass effect, shift of midline structures or acute hemorrhage. No evidence of acute infarction. Vascular: No hyperdense vessel or unexpected calcification. Skull: Normal. Negative for fracture or focal lesion. Sinuses/Orbits: No acute finding. Other: None. IMPRESSION: 1.  No acute findings. 2.  Minimal chronic ischemic microvascular disease. Electronically Signed   By: Marin Olp M.D.   On: 05/28/2019 14:51   US Abdomen  Complete  Result Date: 05/29/2019 CLINICAL DATA:  Ascites due to alcoholic cirrhosis. EXAM: ABDOMEN ULTRASOUND COMPLETE COMPARISON:  01/14/2019 FINDINGS: Gallbladder: Status post cholecystectomy. Common bile duct: Diameter: 3.4 mm Liver: Cirrhotic liver appears shrunken with a nodular contour and heterogeneous echotexture. No focal liver abnormality. Portal vein is patent on color Doppler imaging with normal direction of blood flow towards the liver. IVC: No abnormality visualized. Pancreas: Visualized portion unremarkable. Spleen: Size and appearance within normal limits. Right Kidney: Length: 10.3 cm. Echogenicity within normal limits. No mass or hydronephrosis visualized. Left Kidney: Length: 9.6 cm. Echogenicity within normal limits. No mass or hydronephrosis visualized. Abdominal aorta: No aneurysm visualized. Other findings: Moderate volume of ascites identified within all 4 quadrants of the abdomen. IMPRESSION: 1. Morphologic features of liver compatible with cirrhosis. 2. Moderate volume of ascites is identified in all 4 quadrants of the abdomen. Electronically Signed   By: Kerby Moors M.D.   On: 05/29/2019 11:17   US Paracentesis  Result Date: 05/30/2019 INDICATION: Patient with history alcoholic cirrhosis, esophageal varices s/p banding, abdominal distension, and ascites. Request is made for diagnostic and therapeutic paracentesis. EXAM: ULTRASOUND GUIDED DIAGNOSTIC AND THERAPEUTIC PARACENTESIS MEDICATIONS: 15 mL 1% lidocaine COMPLICATIONS: None immediate. PROCEDURE: Informed written consent was obtained from the patient after a discussion of the risks, benefits and alternatives to treatment. A timeout was performed prior to the initiation of the procedure. Initial ultrasound scanning demonstrates a moderate amount of ascites within the left lower abdominal quadrant. The left lower abdomen was prepped and draped in the usual sterile fashion. 1% lidocaine was used for local anesthesia. Following  this, a 19 gauge, 7-cm, Yueh catheter was introduced. An ultrasound image was saved for documentation purposes. The paracentesis was performed. The catheter was removed and a dressing was applied. The patient tolerated the procedure well without immediate post procedural complication. Patient received post-procedure intravenous albumin; see nursing notes for details. FINDINGS: A total of approximately 3.2 L of clear yellow fluid was removed. Samples were sent to the laboratory as requested by the clinical team. IMPRESSION: Successful ultrasound-guided paracentesis yielding 3.2 L of peritoneal fluid. Read by: Earley Abide, PA-C Electronically Signed   By: Sandi Mariscal M.D.   On: 05/30/2019 12:13   Dg Chest Port 1 View  Result Date: 05/28/2019 CLINICAL DATA:  Altered mental status, weakness. EXAM: PORTABLE CHEST 1 VIEW COMPARISON:  07/14/2018. FINDINGS: Trachea is midline. Heart size normal. Lungs are clear. Left costophrenic angle was not included on the image. No large pleural effusion. IMPRESSION: No acute findings. Electronically Signed   By: Lorin Picket M.D.   On: 05/28/2019 13:57   Vas [place] Lower Extremity Venous (dvt)  Result Date: 06/01/2019  Lower Venous Study Indications: History of DVT.  Anticoagulation: Eliquis. Comparison Study: 03/30/2019 - Right EIV, CFV, FV, PopV, PTV, PeroV Performing Technologist: Oliver Hum RVT  Examination Guidelines: A complete evaluation includes B-mode imaging, spectral Doppler, color Doppler, and power Doppler as needed of all accessible portions of each vessel. Bilateral testing is considered an integral part of a complete  examination. Limited examinations for reoccurring indications may be performed as noted.  +---------+---------------+---------+-----------+----------+-----------------+  RIGHT     Compressibility Phasicity Spontaneity Properties Thrombus Aging     +---------+---------------+---------+-----------+----------+-----------------+  CFV        Partial         Yes       Yes                    Age Indeterminate  +---------+---------------+---------+-----------+----------+-----------------+  SFJ       Full                                                                +---------+---------------+---------+-----------+----------+-----------------+  FV Prox   Partial         No        No                     Age Indeterminate  +---------+---------------+---------+-----------+----------+-----------------+  FV Mid    Partial         No        No                     Age Indeterminate  +---------+---------------+---------+-----------+----------+-----------------+  FV Distal Partial         Yes       Yes                    Age Indeterminate  +---------+---------------+---------+-----------+----------+-----------------+  PFV       Full                                                                +---------+---------------+---------+-----------+----------+-----------------+  POP       Partial         No        No                     Age Indeterminate  +---------+---------------+---------+-----------+----------+-----------------+  PTV       Full                                                                +---------+---------------+---------+-----------+----------+-----------------+  PERO      Full                                                                +---------+---------------+---------+-----------+----------+-----------------+   +----+---------------+---------+-----------+----------+--------------+  LEFT Compressibility Phasicity Spontaneity Properties Thrombus Aging  +----+---------------+---------+-----------+----------+--------------+  CFV  Full            Yes       Yes                                    +----+---------------+---------+-----------+----------+--------------+  Summary: Right: Findings consistent with age indeterminate deep vein thrombosis involving the right common femoral vein, right femoral vein, and right popliteal vein. No cystic  structure found in the popliteal fossa. Left: No evidence of common femoral vein obstruction.  *See table(s) above for measurements and observations. Electronically signed by Deitra Mayo MD on 06/01/2019 at 1:09:33 PM.    Final    Ir Radiologist Eval & Mgmt  Result Date: 05/26/2019 Please refer to notes tab for details about interventional procedure. (Op Note)   Labs:  CBC: Recent Labs    06/03/19 1210 06/04/19 1150  06/05/19 0159  06/05/19 2000 06/06/19 0157 06/06/19 0922 06/06/19 1434  WBC 7.5 12.5*  --  10.0  --   --  10.9*  --   --   HGB 7.4 Repeated and verified X2.* 7.6*   < > 7.5*   7.7*   < > 8.0* 7.5* 7.6* 8.1*  HCT 22.8 Repeated and verified X2.* 25.3*   < > 24.6*   25.1*   < > 25.8* 24.9* 24.8* 26.6*  PLT 177.0 225  --  146*  --   --  158  --   --    < > = values in this interval not displayed.    COAGS: Recent Labs    03/30/19 1050 03/31/19 0441 05/29/19 1823 06/05/19 0159  INR 1.3* 1.4* 1.6* 1.7*    BMP: Recent Labs    05/31/19 0453 06/03/19 1210 06/04/19 1150 06/05/19 0159 06/06/19 0157  NA 132* 127* 128* 130* 129*  K 4.2 4.5 3.8 4.0 3.9  CL 104 99 97* 102 104  CO2 21* 21 19* 18* 16*  GLUCOSE 127* 170* 107* 127* 106*  BUN 31* 28* 38* 35* 37*  CALCIUM 7.6* 8.3* 8.3* 8.1* 8.1*  CREATININE 1.59* 1.50 1.89* 1.75* 1.85*  GFRNONAA 43*  --  35* 38* 36*  GFRAA 50*  --  40* 44* 42*    LIVER FUNCTION TESTS: Recent Labs    05/30/19 0524 05/31/19 0453 06/04/19 1150 06/05/19 0159 06/06/19 0157  BILITOT 0.7 0.4 1.0 0.4  --   AST 24 26 36 35  --   ALT 16 17 22 21   --   ALKPHOS 67 64 85 79  --   PROT 5.4* 5.2* 6.1* 5.3*  --   ALBUMIN 2.2* 2.0* 2.6* 2.1* 2.0*    TUMOR MARKERS: No results for input(s): AFPTM, CEA, CA199, CHROMGRNA in the last 8760 hours.  Assessment and Plan: RLE DVT Patient with RLE DVT by doppler 03/30/19  involving the right common femoral vein, right femoral vein, and right popliteal vein. Previously on Eliquis at  home, however now with 2nd GI bleed in 1 week.  Eliquis held.  IR consulted for IVC filter placement.  NPO p MN.  Platelets 158, INR 1.7  Risks and benefits discussed with the patient including, but not limited to bleeding, infection, contrast induced renal failure, filter fracture or migration which can lead to emergency surgery or even death, strut penetration with damage or irritation to adjacent structures and caval thrombosis.  All of the patient's questions were answered, patient is agreeable to proceed. Consent signed and in chart.  GI bleed in the setting of cirrhosis, esophageal varices, small bowel AVMs, hemorrhoids, and suspected portal colopathy Patient with melena at home.  HgB 7.4 on admission.  Known to IR from recent TIPS consultation.  Plan was for CT/BRTO protocol with limited contrast due to renal insufficiency in the next week.  Now admitted  with GI bleed.   Ongoing care planning re: TIPS feasibility.  MELDNa 24.    Thank you for this interesting consult.  I greatly enjoyed meeting DANIELE LAFAUCI and look forward to participating in their care.  A copy of this report was sent to the requesting provider on this date.  Electronically Signed: Docia Barrier, PA 06/06/2019, 4:22 PM   I spent a total of 40 Minutes    in face to face in clinical consultation, greater than 50% of which was counseling/coordinating care for RLE DVT, cirrhosis, esophageal varices.

## 2019-06-06 NOTE — Anesthesia Preprocedure Evaluation (Signed)
Anesthesia Evaluation  Patient identified by MRN, date of birth, ID band Patient awake    Reviewed: Allergy & Precautions, H&P , NPO status , Patient's Chart, lab work & pertinent test results  Airway Mallampati: II  TM Distance: >3 FB Neck ROM: Full    Dental no notable dental hx. (+) Teeth Intact, Dental Advisory Given   Pulmonary neg pulmonary ROS, Current Smoker and Patient abstained from smoking.,    Pulmonary exam normal breath sounds clear to auscultation       Cardiovascular hypertension, + DVT   Rhythm:Regular Rate:Normal     Neuro/Psych Anxiety Depression negative neurological ROS     GI/Hepatic PUD, GERD  Medicated,(+) Cirrhosis   Esophageal Varices    ,   Endo/Other  negative endocrine ROS  Renal/GU Renal InsufficiencyRenal disease  negative genitourinary   Musculoskeletal  (+) Arthritis , Osteoarthritis,    Abdominal   Peds  Hematology  (+) Blood dyscrasia, anemia ,   Anesthesia Other Findings   Reproductive/Obstetrics negative OB ROS                             Anesthesia Physical  Anesthesia Plan  ASA: III  Anesthesia Plan: MAC   Post-op Pain Management:    Induction: Intravenous  PONV Risk Score and Plan: 0 and Propofol infusion  Airway Management Planned: Nasal Cannula  Additional Equipment:   Intra-op Plan:   Post-operative Plan:   Informed Consent: I have reviewed the patients History and Physical, chart, labs and discussed the procedure including the risks, benefits and alternatives for the proposed anesthesia with the patient or authorized representative who has indicated his/her understanding and acceptance.     Dental advisory given  Plan Discussed with: CRNA  Anesthesia Plan Comments:         Anesthesia Quick Evaluation

## 2019-06-07 ENCOUNTER — Encounter (HOSPITAL_COMMUNITY)
Admission: EM | Disposition: A | Discharge status: Home Health | Payer: Self-pay | Source: Home / Self Care | Attending: Internal Medicine

## 2019-06-07 ENCOUNTER — Inpatient Hospital Stay (HOSPITAL_COMMUNITY): Payer: 59 | Admitting: Anesthesiology

## 2019-06-07 ENCOUNTER — Encounter (HOSPITAL_COMMUNITY): Payer: Self-pay | Admitting: Gastroenterology

## 2019-06-07 ENCOUNTER — Inpatient Hospital Stay (HOSPITAL_COMMUNITY): Payer: 59

## 2019-06-07 DIAGNOSIS — K921 Melena: Secondary | ICD-10-CM

## 2019-06-07 DIAGNOSIS — K5520 Angiodysplasia of colon without hemorrhage: Secondary | ICD-10-CM

## 2019-06-07 DIAGNOSIS — N179 Acute kidney failure, unspecified: Secondary | ICD-10-CM

## 2019-06-07 DIAGNOSIS — K222 Esophageal obstruction: Secondary | ICD-10-CM

## 2019-06-07 DIAGNOSIS — K2991 Gastroduodenitis, unspecified, with bleeding: Secondary | ICD-10-CM

## 2019-06-07 DIAGNOSIS — K766 Portal hypertension: Secondary | ICD-10-CM

## 2019-06-07 DIAGNOSIS — B3781 Candidal esophagitis: Secondary | ICD-10-CM

## 2019-06-07 DIAGNOSIS — K625 Hemorrhage of anus and rectum: Secondary | ICD-10-CM

## 2019-06-07 DIAGNOSIS — N1831 Chronic kidney disease, stage 3a: Secondary | ICD-10-CM

## 2019-06-07 DIAGNOSIS — I8510 Secondary esophageal varices without bleeding: Secondary | ICD-10-CM

## 2019-06-07 HISTORY — PX: IR IVC FILTER PLMT / S&I /IMG GUID/MOD SED: IMG701

## 2019-06-07 HISTORY — PX: ESOPHAGOGASTRODUODENOSCOPY (EGD) WITH PROPOFOL: SHX5813

## 2019-06-07 HISTORY — PX: COLONOSCOPY WITH PROPOFOL: SHX5780

## 2019-06-07 HISTORY — PX: HOT HEMOSTASIS: SHX5433

## 2019-06-07 LAB — HEMOGLOBIN AND HEMATOCRIT, BLOOD
HCT: 25.9 % — ABNORMAL LOW (ref 39.0–52.0)
HCT: 26.3 % — ABNORMAL LOW (ref 39.0–52.0)
HCT: 25.2 % — ABNORMAL LOW (ref 39.0–52.0)
HCT: 25.7 % — ABNORMAL LOW (ref 39.0–52.0)
Hemoglobin: 7.9 g/dL — ABNORMAL LOW (ref 13.0–17.0)
Hemoglobin: 7.9 g/dL — ABNORMAL LOW (ref 13.0–17.0)
Hemoglobin: 7.8 g/dL — ABNORMAL LOW (ref 13.0–17.0)
Hemoglobin: 7.6 g/dL — ABNORMAL LOW (ref 13.0–17.0)

## 2019-06-07 LAB — PROTIME-INR
INR: 1.4 — ABNORMAL HIGH (ref 0.8–1.2)
Prothrombin Time: 17.2 seconds — ABNORMAL HIGH (ref 11.4–15.2)

## 2019-06-07 SURGERY — COLONOSCOPY WITH PROPOFOL
Anesthesia: Monitor Anesthesia Care

## 2019-06-07 MED ORDER — LIDOCAINE HCL (PF) 1 % IJ SOLN
INTRAMUSCULAR | Status: AC | PRN
  Administered 2019-06-07: 5 mL

## 2019-06-07 MED ORDER — MIDAZOLAM HCL 2 MG/2ML IJ SOLN
INTRAMUSCULAR | Status: AC
  Filled 2019-06-07: qty 4

## 2019-06-07 MED ORDER — PROPOFOL 10 MG/ML IV BOLUS
INTRAVENOUS | Status: DC | PRN
  Administered 2019-06-07 (×3): 20 mg via INTRAVENOUS

## 2019-06-07 MED ORDER — MIDAZOLAM HCL 2 MG/2ML IJ SOLN
INTRAMUSCULAR | Status: AC | PRN
  Administered 2019-06-07 (×2): 1 mg via INTRAVENOUS

## 2019-06-07 MED ORDER — PROPOFOL 10 MG/ML IV BOLUS
INTRAVENOUS | Status: AC
  Filled 2019-06-07: qty 20

## 2019-06-07 MED ORDER — LACTATED RINGERS IV SOLN
INTRAVENOUS | Status: DC
  Administered 2019-06-07: 07:00:00 via INTRAVENOUS

## 2019-06-07 MED ORDER — FENTANYL CITRATE (PF) 100 MCG/2ML IJ SOLN
INTRAMUSCULAR | Status: AC
  Filled 2019-06-07: qty 2

## 2019-06-07 MED ORDER — LIDOCAINE HCL 1 % IJ SOLN
INTRAMUSCULAR | Status: AC
  Filled 2019-06-07: qty 20

## 2019-06-07 MED ORDER — FENTANYL CITRATE (PF) 100 MCG/2ML IJ SOLN
INTRAMUSCULAR | Status: AC | PRN
  Administered 2019-06-07 (×2): 50 ug via INTRAVENOUS

## 2019-06-07 MED ORDER — FLUCONAZOLE 100 MG PO TABS
100.0000 mg | ORAL_TABLET | Freq: Every day | ORAL | Status: DC
  Administered 2019-06-07 – 2019-06-09 (×3): 100 mg via ORAL
  Filled 2019-06-07 (×3): qty 1

## 2019-06-07 MED ORDER — IOHEXOL 350 MG/ML SOLN
80.0000 mL | Freq: Once | INTRAVENOUS | Status: AC | PRN
  Administered 2019-06-07: 80 mL via INTRAVENOUS

## 2019-06-07 MED ORDER — PROPOFOL 500 MG/50ML IV EMUL
INTRAVENOUS | Status: DC | PRN
  Administered 2019-06-07: 135 ug/kg/min via INTRAVENOUS

## 2019-06-07 MED ORDER — SODIUM CHLORIDE (PF) 0.9 % IJ SOLN
INTRAMUSCULAR | Status: AC
  Filled 2019-06-07: qty 100

## 2019-06-07 MED ORDER — PROPOFOL 500 MG/50ML IV EMUL
INTRAVENOUS | Status: AC
  Filled 2019-06-07: qty 50

## 2019-06-07 SURGICAL SUPPLY — 25 items

## 2019-06-07 NOTE — Procedures (Signed)
Interventional Radiology Procedure Note  Procedure: Successful placement of IVC filter.   Complications: None  Estimated Blood Loss: None  Recommendations: - Bedrest with HOB elevated x 2 hrs  Signed,  Criselda Peaches, MD

## 2019-06-07 NOTE — Interval H&P Note (Signed)
History and Physical Interval Note:  06/07/2019 7:33 AM  Billy [name]  has presented today for surgery, with the diagnosis of GI bleed/ cirrhosis.  The various methods of treatment have been discussed with the patient and family. After consideration of risks, benefits and other options for treatment, the patient has consented to  Procedure(s): COLONOSCOPY WITH PROPOFOL (N/A) ESOPHAGOGASTRODUODENOSCOPY (EGD) WITH PROPOFOL (N/A) as a surgical intervention.  The patient's history has been reviewed, patient examined, no change in status, stable for surgery.  I have reviewed the patient's chart and labs.  Questions were answered to the patient's satisfaction.     Pricilla Riffle. Fuller Plan

## 2019-06-07 NOTE — Transfer of Care (Signed)
Immediate Anesthesia Transfer of Care Note  Patient: Billy [name]  Procedure(s) Performed: COLONOSCOPY WITH PROPOFOL (N/A ) ESOPHAGOGASTRODUODENOSCOPY (EGD) WITH PROPOFOL (N/A ) HOT HEMOSTASIS (ARGON PLASMA COAGULATION/BICAP) (N/A )  Patient Location: PACU  Anesthesia Type:MAC  Level of Consciousness: awake and alert   Airway & Oxygen Therapy: Patient Spontanous Breathing and Patient connected to face mask oxygen  Post-op Assessment: Report given to RN, Post -op Vital signs reviewed and stable and Patient moving all extremities X 4  Post vital signs: Reviewed and stable  Last Vitals:  Vitals Value Taken Time  BP    Temp    Pulse 106 06/07/19 0820  Resp 25 06/07/19 0820  SpO2 100 % 06/07/19 0820  Vitals shown include unvalidated device data.  Last Pain:  Vitals:   06/07/19 0657  TempSrc: Oral  PainSc: 0-No pain      Patients Stated Pain Goal: 5 (94/49/67 5916)  Complications: No apparent anesthesia complications

## 2019-06-07 NOTE — Progress Notes (Signed)
PROGRESS NOTE    KEYSHAWN [name]  [id number]  DOB: 1945-07-18  DOA: 06/04/2019 PCP: [name], MD  Brief Narrative:  73-y/o male with h/o  alcohol abuse,liver cirrhosis complicated by portal hypertension/eso varices/ascites/hepatic encephalopathy/thrombocytopenia, GERD, angiodysplasia of the colon requiring APC, diverticulosis,depression who wasrecently diagnosed with RLE DVT on 03/30/19 and started on Eliquis, was admitted to this medical center 11/13-11/16 for UGIB, hepatic encephalopathy and AKI. He was seen by GI and underwent upper endoscopy showing severe erosive esophagitis, mild portal hypertensive gastropathy, small bowel AVMs which were cauterized. He was treated conservatively with PPI, carafate, blood transfusion, and supportive care with improvement. His RLE DVT was re-imaged 05/31/19 during his previous admission and showed ongoing deep vein thrombosis involving the right common femoral vein, right femoral vein, and right popliteal vein.He was discharged with instructions to resume anticoagulation.  [facility] with c/o melena and BRBPR at home. Hgb on admission was 7.4. His Eliquis has been held. He was transfused 1u PRBC with improvement in Hgb to 8.1. GI consulted and plan for colonoscopy in am. IR consulted for IVC filter placement. Serum creatinine of 1.75 also noted on presentation, with baseline serum creatinine of 1.3. Patient is currently on IV Protonix 40 mg twice daily, sucralfate and IV Rocephin for SBP prophylaxis  Subjective:  Patient underwent colonoscopy this morning.  Remains n.p.o. for scheduled IVC filter placement through IR.  He states he is hungry and requesting to eat.  Nurse reports giving him a popsicle earlier.  Denies any respiratory complaints.  Noted to have mild to moderate ascites and patient states it has recurred since last paracentesis.  Objective: Vitals:   06/07/19 0657 06/07/19 0820 06/07/19 0830 06/07/19  0900  BP: (!) 155/66 (!) 121/58 130/66 (!) 151/74  Pulse: (!) 110 (!) 106 90 (!) 106  Resp: (!) 27 (!) 25 18 (!) 24  Temp: 97.6 F (36.4 C) (!) 97.3 F (36.3 C)    TempSrc: Oral Temporal    SpO2: 97% 100% 99% 100%  Weight:      Height:        Intake/Output Summary (Last 24 hours) at 06/07/2019 1030 Last data filed at 06/07/2019 0917 Gross per 24 hour  Intake 600 ml  Output -  Net 600 ml   Filed Weights   06/05/19 0500 06/06/19 0448 06/07/19 0327  Weight: 78.1 kg 80.5 kg 78.3 kg    Physical Examination:  General exam: Appears calm and comfortable  Respiratory system: Clear to auscultation. Respiratory effort normal. Cardiovascular system: S1 & S2 heard, RRR. No JVD, murmurs, rubs, gallops or clicks. No pedal edema. Gastrointestinal system: Abdomen is moderately distended with ascites, soft and nontender. Normal bowel sounds heard. Central nervous system: Alert and oriented. No focal neurological deficits. Extremities: Symmetric 5 x 5 power.  Right lower extremity mildly swollen compared to left but no significant edema noted. Skin: No rashes, lesions or ulcers Psychiatry: Judgement and insight appear normal. Mood & affect appropriate.     Data Reviewed: I have personally reviewed following labs and imaging studies  CBC: Recent Labs  Lab 06/03/19 1210 06/04/19 1150  06/05/19 0159  06/06/19 0157 06/06/19 0922 06/06/19 1434 06/06/19 2154 06/07/19 0224  WBC 7.5 12.5*  --  10.0  --  10.9*  --   --   --   --   NEUTROABS 6.3 10.6*  --   --   --  9.0*  --   --   --   --  HGB 7.4 Repeated and verified X2.* 7.6*   < > 7.5*  7.7*   < > 7.5* 7.6* 8.1* 8.3* 7.9*  HCT 22.8 Repeated and verified X2.* 25.3*   < > 24.6*  25.1*   < > 24.9* 24.8* 26.6* 26.9* 25.9*  MCV 94.3 100.0  --  96.2  --  100.0  --   --   --   --   PLT 177.0 225  --  146*  --  158  --   --   --   --    < > = values in this interval not displayed.   Basic Metabolic Panel: Recent Labs  Lab 06/03/19  1210 06/04/19 1150 06/05/19 0159 06/06/19 0157  NA 127* 128* 130* 129*  K 4.5 3.8 4.0 3.9  CL 99 97* 102 104  CO2 21 19* 18* 16*  GLUCOSE 170* 107* 127* 106*  BUN 28* 38* 35* 37*  CREATININE 1.50 1.89* 1.75* 1.85*  CALCIUM 8.3* 8.3* 8.1* 8.1*  MG  --   --  2.0  --   PHOS  --   --   --  3.6   GFR: Estimated Creatinine Clearance: 40.6 mL/min (A) (by C-G formula based on SCr of 1.85 mg/dL (H)). Liver Function Tests: Recent Labs  Lab 06/04/19 1150 06/05/19 0159 06/06/19 0157  AST 36 35  --   ALT 22 21  --   ALKPHOS 85 79  --   BILITOT 1.0 0.4  --   PROT 6.1* 5.3*  --   ALBUMIN 2.6* 2.1* 2.0*   No results for input(s): LIPASE, AMYLASE in the last 168 hours. No results for input(s): AMMONIA in the last 168 hours. Coagulation Profile: Recent Labs  Lab 06/05/19 0159 06/07/19 0224  INR 1.7* 1.4*   Cardiac Enzymes: No results for input(s): CKTOTAL, CKMB, CKMBINDEX, TROPONINI in the last 168 hours. BNP (last 3 results) No results for input(s): PROBNP in the last 8760 hours. HbA1C: No results for input(s): HGBA1C in the last 72 hours. CBG: No results for input(s): GLUCAP in the last 168 hours. Lipid Profile: No results for input(s): CHOL, HDL, LDLCALC, TRIG, CHOLHDL, LDLDIRECT in the last 72 hours. Thyroid Function Tests: Recent Labs    06/05/19 1210  TSH 2.194   Anemia Panel: No results for input(s): VITAMINB12, FOLATE, FERRITIN, TIBC, IRON, RETICCTPCT in the last 72 hours. Sepsis Labs: No results for input(s): PROCALCITON, LATICACIDVEN in the last 168 hours.  Recent Results (from the past 240 hour(s))  SARS CORONAVIRUS 2 (TAT 6-24 HRS) Nasopharyngeal Nasopharyngeal Swab     Status: None   Collection Time: 05/28/19  1:45 PM   Specimen: Nasopharyngeal Swab  Result Value Ref Range Status   SARS Coronavirus 2 NEGATIVE NEGATIVE Final    Comment: (NOTE) SARS-CoV-2 target nucleic acids are NOT DETECTED. The SARS-CoV-2 RNA is generally detectable in upper and lower  respiratory specimens during the acute phase of infection. Negative results do not preclude SARS-CoV-2 infection, do not rule out co-infections with other pathogens, and should not be used as the sole basis for treatment or other patient management decisions. Negative results must be combined with clinical observations, patient history, and epidemiological information. The expected result is Negative. Fact Sheet for Patients: SugarRoll.be Fact Sheet for Healthcare Providers: https://www.woods-mathews.com/ This test is not yet approved or cleared by the [place] FDA and  has been authorized for detection and/or diagnosis of SARS-CoV-2 by FDA under an Emergency Use Authorization (EUA). This EUA will remain  in  effect (meaning this test can be used) for the duration of the COVID-19 declaration under Section 56 4(b)(1) of the Act, 21 U.S.C. section 360bbb-3(b)(1), unless the authorization is terminated or revoked sooner. Performed at [facility], [place] [address], [place], [place] [address]   Body fluid culture     Status: None   Collection Time: 05/30/19 11:38 AM   Specimen: PATH Cytology Peritoneal fluid  Result Value Ref Range Status   Specimen Description   Final    PERITONEAL Performed at [facility] [address] [place], [place] [address]    Special Requests   Final    NONE Performed at [facility], [place] [address], [place], [place] [address]    Gram Stain NO WBC SEEN NO ORGANISMS SEEN   Final   Culture   Final    NO GROWTH 3 DAYS Performed at [facility], [place] [address], [place], [place] [address]    Report Status 06/02/2019 FINAL  Final  Culture, body fluid-bottle     Status: None   Collection Time: 05/30/19 11:38 AM   Specimen: Peritoneal Washings  Result Value Ref Range Status   Specimen Description PERITONEAL  Final   Special Requests NONE  Final   Culture    Final    NO GROWTH 5 DAYS Performed at [facility], [address], [place], [place] [address]    Report Status 06/04/2019 FINAL  Final  Gram stain     Status: None   Collection Time: 05/30/19 11:38 AM   Specimen: Peritoneal Washings  Result Value Ref Range Status   Specimen Description PERITONEAL  Final   Special Requests NONE  Final   Gram Stain   Final    RARE WBC PRESENT, PREDOMINANTLY MONONUCLEAR NO ORGANISMS SEEN Performed at [facility], [place] [address], [place], [place] [address]    Report Status 05/31/2019 FINAL  Final  SARS CORONAVIRUS 2 (TAT 6-24 HRS) Nasopharyngeal Nasopharyngeal Swab     Status: None   Collection Time: 06/04/19  1:57 PM   Specimen: Nasopharyngeal Swab  Result Value Ref Range Status   SARS Coronavirus 2 NEGATIVE NEGATIVE Final    Comment: (NOTE) SARS-CoV-2 target nucleic acids are NOT DETECTED. The SARS-CoV-2 RNA is generally detectable in upper and lower respiratory specimens during the acute phase of infection. Negative results do not preclude SARS-CoV-2 infection, do not rule out co-infections with other pathogens, and should not be used as the sole basis for treatment or other patient management decisions. Negative results must be combined with clinical observations, patient history, and epidemiological information. The expected result is Negative. Fact Sheet for Patients: SugarRoll.be Fact Sheet for Healthcare Providers: https://www.woods-mathews.com/ This test is not yet approved or cleared by the [place] FDA and  has been authorized for detection and/or diagnosis of SARS-CoV-2 by FDA under an Emergency Use Authorization (EUA). This EUA will remain  in effect (meaning this test can be used) for the duration of the COVID-19 declaration under Section 56 4(b)(1) of the Act, 21 U.S.C. section 360bbb-3(b)(1), unless the authorization is terminated or revoked sooner. Performed at [facility], [place] [address] [place], [place] [address]   MRSA PCR Screening     Status: None   Collection Time: 06/04/19  6:13 PM   Specimen: Nasal Mucosa; Nasopharyngeal  Result Value Ref Range Status   MRSA by PCR NEGATIVE NEGATIVE Final    Comment:  The GeneXpert MRSA Assay (FDA approved for NASAL specimens only), is one component of a comprehensive MRSA colonization surveillance program. It is not intended to diagnose MRSA infection nor to guide or monitor treatment for MRSA infections. Performed at [facility], [place] [address] [place], [place] [address]       Radiology Studies: No results found.      Scheduled Meds: . buPROPion  150 mg Oral Daily  . Chlorhexidine Gluconate Cloth  6 each Topical Daily  . fluconazole  100 mg Oral Daily  . guaiFENesin  600 mg Oral BID  . lactulose  20 g Oral Daily  . mirtazapine  45 mg Oral QHS  . pantoprazole (PROTONIX) IV  40 mg Intravenous Q12H  . silver sulfADIAZINE   Topical BID  . sucralfate  1 g Oral Q6H   Continuous Infusions: . sodium chloride Stopped (06/04/19 2203)  . cefTRIAXone (ROCEPHIN)  IV Stopped (06/06/19 1910)    Assessment & Plan:   1. Recurrent upper and lower GI bleed:  In the setting of thrombocytopenia/coagulopathy.  Patient denies recent alcohol use.  S/P EGD in recent admission showing  severe erosive esophagitis--felt to be source of acute bleeding, also noted to have non bleeding large esophageal varices , mild portal hypertensive gastropathy, small bowel AVMs which were cauterized.  Anticoagulation held on admission. S/p 1 unit PRBC in this admission. No active bleeding currently. Hgb  Stable around 7.5-8.2.Continue IV PPI twice daily, sucralfate. Underwent EGD/colonoscopy today.   EGD 11/23 findings : LA Grade C reflux esophagitis with mild focal contact bleeding. - Grade II esophageal varices. - Esophageal plaques were found, consistent with candidiasis. -  Benign-appearing esophageal stenosis. - Portal hypertensive gastropathy. - Erythematous duodenopathy c/w portal duodenopathy  Colonoscopy 11/23 findings: - Focal areas of frythematous mucosa in the sigmoid colon, in the descending colon, in the transverse colon and in the ascending colon c/w portal colopathy. - Two non-bleeding colonic angiodysplastic lesions vs prominent portal colopathy. Treated with argon plasma coagulation (APC). - Focal descending colon superficial abrasions from scope loop. - Diverticulosis in the left colon. - Internal hemorrhoids vs small rectal varices Will treat with Diflucan x7 days as suggested by GI, continue PPI twice daily and follow-up with Dr. Carlean Purl upon discharge.  Avoid NSAIDs, antiplatelet agents, anticoagulants.    2. Recent extensive RLE DVT: Not a candidate for anticoagulation due to #1. Plan for IVC filter later today.  Explained the risk of thrombosis around IVC filter as well and patient understands regarding limited options as of now.  He is agreeable for IVC filter placement.  3. Decompensated liver cirrhosis with refractory ascitis : Patient has alcoholic liver cirrhosis complicated by portal hypertension/eso varices/ascites/hepatic encephalopathy/thrombocytopenia/coagulapathy and hyponatremia. He Underwent paracentesis in recent admission but avoiding now in concern for #4.  He was evaluated by radiology last admission-scheduled for CTB RTO/TIPS protocol with reduced IV contrast this coming week to assess anatomy for TIPS---GI to d/w IR regarding doing this while here. Continue IV Rocephin for SBP prophylaxis.No signs of encephalopathy now.  4. AKI on CKD stage 3a: secondary to volume loss in the setting of GIB vs aldactone use vs ATN vs hepatorenal syndrome. He had nl creatinine 0.8-1.0 until June 2020. It was up at 1.3 in Aug/sept and up to 1.7 in October/early November.  He presented with creatinine 2.5 on 11/13( previous admission) but improved  to 1.5 at discharge.   5. Hyponatremia: Hypervolemic likely. Asymptomatic and levels fluctuate 125-130. Reluctant to give aggressive diuresis  due to #4.  6. Intermittent sinus tachycardia:This tends to occur when patient ambulates. Could be related to deconditioning and ascites/volume status. Will d/c Midodrine given tachy and rising BP. Check orthostatics when work with PT again.    DVT prophylaxis: SCDs Code Status: Full code Family / Patient Communication: Discussed with patient Disposition Plan: Home when medically cleared.  PT recommended home health with DME (walker with 5 inch wheels)-face-to-face/HH orders placed.  Will downgrade to telemetry bed today.     LOS: 3 days    Time spent: 35 minutes    Guilford Shi, MD [facility] Pager [phone]  If 7PM-7AM, please contact night-coverage [email or web address] Password [email or web address] 06/07/2019, 10:30 AM

## 2019-06-07 NOTE — Progress Notes (Signed)
Please contact patient's son Erlene Quan with updates on patient status. Phone number is in emergency contacts.

## 2019-06-07 NOTE — Op Note (Signed)
[facility] Patient Name: Billy [name] Procedure Date: 06/07/2019 MRN: [id number] Attending MD: Ladene Artist , MD Date of Birth: 25-May-1946 CSN: [id number] Age: 73 Admit Type: Inpatient Procedure:                Colonoscopy Indications:              Melena, Rectal bleeding Providers:                Pricilla Riffle. Fuller Plan, MD, Grace Isaac, RN, Benay Pillow, RN, William Dalton, Technician Referring MD:             [facility] Medicines:                Monitored Anesthesia Care Complications:            No immediate complications. Estimated blood loss:                            None. Estimated Blood Loss:     Estimated blood loss: none. Procedure:                Pre-Anesthesia Assessment:                           - Prior to the procedure, a History and Physical                            was performed, and patient medications and                            allergies were reviewed. The patient's tolerance of                            previous anesthesia was also reviewed. The risks                            and benefits of the procedure and the sedation                            options and risks were discussed with the patient.                            All questions were answered, and informed consent                            was obtained. Prior Anticoagulants: The patient has                            taken Eliquis (apixaban), last dose was 4 days                            prior to procedure. ASA Grade Assessment: III - A  patient with severe systemic disease. After                            reviewing the risks and benefits, the patient was                            deemed in satisfactory condition to undergo the                            procedure.                           After obtaining informed consent, the colonoscope                            was passed under direct vision. Throughout the                             procedure, the patient's blood pressure, pulse, and                            oxygen saturations were monitored continuously. The                            [id number] Olympus pediatric colonscope                            was introduced through the anus and advanced to the                            the cecum, identified by appendiceal orifice and                            ileocecal valve. The ileocecal valve, appendiceal                            orifice, and rectum were photographed. The quality                            of the bowel preparation was good. The colonoscopy                            was performed without difficulty. The patient                            tolerated the procedure well. Scope In: 7:41:02 AM Scope Out: 8:00:29 AM Scope Withdrawal Time: 0 hours 15 minutes 38 seconds  Total Procedure Duration: 0 hours 19 minutes 27 seconds  Findings:      Hemorrhoidal tags were found on perianal exam.      Multiple patchy focal areas of moderately erythematous mucosa was found       in the sigmoid colon, in the descending colon, in the transverse colon       and in the ascending colon c/w portal colopathy.      Two small  localized angiodysplastic lesions vs prominent portal       colopathy without bleeding were found in the transverse colon.       Coagulation for bleeding prevention using argon plasma was successful.      An area of moderately congested mucosa was found in the rectum, in the       sigmoid colon and in the descending colon.      Multiple small-mouthed diverticula were found in the left colon. Focal       descending colon superficial abrasoin from scope loop.      Internal hemorrhoids vs small rectal varices were found during       retroflexion. The hemorrhoids were medium-sized and Grade I (internal       hemorrhoids that do not prolapse).      The exam was otherwise without abnormality on direct and retroflexion        views. Impression:               - Hemorrhoidal tags found on perianal exam.                           - Focal areas of frythematous mucosa in the sigmoid                            colon, in the descending colon, in the transverse                            colon and in the ascending colon c/w portal                            colopathy.                           - Two non-bleeding colonic angiodysplastic lesions                            vs prominent portal colopathy. Treated with argon                            plasma coagulation (APC).                           - Congested mucosa in the rectum, in the sigmoid                            colon and in the descending colon.                           - Focal descending colon superficial abrasions from                            scope loop.                           - Diverticulosis in the left colon.                           - Internal hemorrhoids  vs small rectal varices.                           - The examination was otherwise normal on direct                            and retroflexion views.                           - No specimens collected. Moderate Sedation:      Not Applicable - Patient had care per Anesthesia. Recommendation:           - Patient has a contact number available for                            emergencies. The signs and symptoms of potential                            delayed complications were discussed with the                            patient. Return to normal activities tomorrow.                            Written discharge instructions were provided to the                            patient.                           - Resume previous diet.                           - Continue present medications.                           - No recommendation at this time regarding repeat                            colonoscopy due to age.                           - Potential source of bleeding from portal                             colopathy, ? colonic AVMs, hemorrhoids in setting                            of anticoagulation.                           - Continue to hold Eliquis for now.                           - EGD today.                           -  No aspirin, ibuprofen, naproxen, or other                            non-steroidal anti-inflammatory drugs long term.                           - Outpateint GI follow up with Dr. Silvano Rusk. Procedure Code(s):        --- Professional ---                           [phone], Colonoscopy, flexible; with control of                            bleeding, any method Diagnosis Code(s):        --- Professional ---                           K64.0, First degree hemorrhoids                           K63.89, Other specified diseases of intestine                           K55.20, Angiodysplasia of colon without hemorrhage                           K62.89, Other specified diseases of anus and rectum                           K92.1, Melena (includes Hematochezia)                           K62.5, Hemorrhage of anus and rectum                           K57.30, Diverticulosis of large intestine without                            perforation or abscess without bleeding CPT copyright 2019 American [employer] Association. All rights reserved. The codes documented in this report are preliminary and upon coder review may  be revised to meet current compliance requirements. Ladene Artist, MD 06/07/2019 8:21:53 AM This report has been signed electronically. Number of Addenda: 0

## 2019-06-07 NOTE — Anesthesia Procedure Notes (Signed)
Procedure Name: MAC Date/Time: 06/07/2019 7:36 AM Performed by: Niel Hummer, CRNA Pre-anesthesia Checklist: Patient identified, Emergency Drugs available, Suction available and Patient being monitored Patient Re-evaluated:Patient Re-evaluated prior to induction Oxygen Delivery Method: Simple face mask

## 2019-06-07 NOTE — Anesthesia Postprocedure Evaluation (Signed)
Anesthesia Post Note  Patient: Billy [name]  Procedure(s) Performed: COLONOSCOPY WITH PROPOFOL (N/A ) ESOPHAGOGASTRODUODENOSCOPY (EGD) WITH PROPOFOL (N/A ) HOT HEMOSTASIS (ARGON PLASMA COAGULATION/BICAP) (N/A )     Patient location during evaluation: PACU Anesthesia Type: MAC Level of consciousness: awake and alert Pain management: pain level controlled Vital Signs Assessment: post-procedure vital signs reviewed and stable Respiratory status: spontaneous breathing, nonlabored ventilation, respiratory function stable and patient connected to nasal cannula oxygen Cardiovascular status: stable and blood pressure returned to baseline Postop Assessment: no apparent nausea or vomiting Anesthetic complications: no    Last Vitals:  Vitals:   06/07/19 1145 06/07/19 1150  BP: 133/69 138/67  Pulse: 99 (!) 102  Resp: 15 15  Temp:    SpO2: 100% 100%    Last Pain:  Vitals:   06/07/19 1341  TempSrc:   PainSc: 9                  Jezreel Justiniano

## 2019-06-07 NOTE — TOC Initial Note (Signed)
Transition of Care [facility]) - Initial/Assessment Note    Patient Details  Name: Billy [name] MRN: [id number] Date of Birth: 03/29/1946  Transition of Care [facility] CM/SW Contact:    Nila Nephew, LCSW Phone Number: [phone] 06/07/2019, 1:24 PM  Clinical Narrative:     Completed high readmission risk screening due to score 29%. Pt admitted with GI bleed. Pt in procedure this morning at time of screening- spoke with pt's wife via phone re: home health recommendation. She agrees [facility] (PT/aide) would be helpful post DC- CSW making referrals to investigate agency options with pt's [facility] insurance, will follow up with options.           Activities of Daily Living Home Assistive Devices/Equipment: Eyeglasses ADL Screening (condition at time of admission) Patient's cognitive ability adequate to safely complete daily activities?: Yes Is the patient deaf or have difficulty hearing?: Yes(slight hoh) Does the patient have difficulty seeing, even when wearing glasses/contacts?: No Does the patient have difficulty concentrating, remembering, or making decisions?: No Patient able to express need for assistance with ADLs?: Yes Does the patient have difficulty dressing or bathing?: No Independently performs ADLs?: Yes (appropriate for developmental age) Does the patient have difficulty walking or climbing stairs?: Yes(secondary to weakness) Weakness of Legs: Both Weakness of Arms/Hands: None   Admission diagnosis:  Lower GI bleed [K92.2] Patient Active Problem List   Diagnosis Date Noted  . Melena   . Rectal bleeding   . GI bleed 05/29/2019  . AMS (altered mental status) 05/28/2019  . Light headed 05/10/2019  . Chronic renal disease, stage III 04/02/2019  . Gastroesophageal reflux disease with esophagitis   . Acute deep vein thrombosis (DVT) of femoral vein of right lower extremity ([place]) 03/30/2019  . Right leg DVT ([place])   . Narcotic dependence ([place]) 05/19/2017  . Secondary esophageal  varices with bleeding ([place])   . Leg pain, left 04/24/2016  . Ascites 11/20/2015  . Scrotal edema 11/20/2015  . Hydrocele in adult 11/20/2015  . Alcohol dependence ([place]) 09/26/2014  . Thrombocytopenia ([place]) 09/26/2014  . Routine general medical examination at a health care facility 09/16/2012  . Alcoholic cirrhosis of liver ([place]) 09/24/2011  . Portal hypertension ([place]) 09/11/2011  . Angiodysplasia of colon 09/11/2011  . 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 Iron deficiency anemia due to chronic blood loss 09/05/2011  . Personal history of colonic polyps 09/05/2011  . DEGENERATIVE DISC DISEASE, LUMBAR SPINE 10/12/2007  . SLEEP DISORDER 10/12/2007  . Depression 08/06/2006  . Essential hypertension, benign 08/06/2006  . Portal hypertensive gastropathy ([place]) 08/06/2006  . DIVERTICULOSIS, COLON 08/06/2006  . Chronic back pain greater than 3 months duration 08/06/2006   PCP:  [name], MD Pharmacy:   [id number] - [place], [address] [place] [address] Phone: [phone] Fax: [phone]     Social Determinants of Health (SDOH) Interventions    Readmission Risk Interventions Readmission Risk Prevention Plan 06/07/2019  Transportation Screening Complete  PCP or Specialist Appt within 3-5 Days Complete  Not Complete comments pt established with providers  [place] or [place] Complete  Social Work Consult for [place] Planning/Counseling Complete  Palliative Care Screening Not Complete  Palliative Care Screening Not Complete Comments pending need  Medication Review (RN Transport planner) Referral to Pharmacy  Some recent data might be hidden

## 2019-06-07 NOTE — Op Note (Signed)
[facility] Patient Name: Billy [name] Procedure Date: 06/07/2019 MRN: [id number] Attending MD: Ladene Artist , MD Date of Birth: 1945-12-02 CSN: [id number] Age: 73 Admit Type: Inpatient Procedure:                Upper GI endoscopy Indications:              Melena, Suspected upper gastrointestinal bleeding Providers:                Pricilla Riffle. Fuller Plan, MD, Grace Isaac, RN, Benay Pillow, RN, William Dalton, Technician Referring MD:             [facility] Medicines:                Monitored Anesthesia Care Complications:            No immediate complications. Estimated Blood Loss:     Estimated blood loss: none. Procedure:                Pre-Anesthesia Assessment:                           - Prior to the procedure, a History and Physical                            was performed, and patient medications and                            allergies were reviewed. The patient's tolerance of                            previous anesthesia was also reviewed. The risks                            and benefits of the procedure and the sedation                            options and risks were discussed with the patient.                            All questions were answered, and informed consent                            was obtained. Prior Anticoagulants: The patient has                            taken Eliquis (apixaban), last dose was 4 days                            prior to procedure. ASA Grade Assessment: III - A                            patient with severe systemic disease. After  reviewing the risks and benefits, the patient was                            deemed in satisfactory condition to undergo the                            procedure.                           - Prior to the procedure, a History and Physical                            was performed, and patient medications and                            allergies  were reviewed. The patient's tolerance of                            previous anesthesia was also reviewed. The risks                            and benefits of the procedure and the sedation                            options and risks were discussed with the patient.                            All questions were answered, and informed consent                            was obtained. Prior Anticoagulants: The patient has                            taken Eliquis (apixaban), last dose was 4 days                            prior to procedure. ASA Grade Assessment: III - A                            patient with severe systemic disease. After                            reviewing the risks and benefits, the patient was                            deemed in satisfactory condition to undergo the                            procedure.                           After obtaining informed consent, the endoscope was  passed under direct vision. Throughout the                            procedure, the patient's blood pressure, pulse, and                            oxygen saturations were monitored continuously. The                            [id number] Olympus gastroscope was                            introduced through the mouth, and advanced to the                            second part of duodenum. The upper GI endoscopy was                            accomplished without difficulty. The patient                            tolerated the procedure well. Scope In: Scope Out: Findings:      LA Grade C (one or more mucosal breaks continuous between tops of 2 or       more mucosal folds, less than 75% circumference) esophagitis with mild       focal contact bleeding was found in the distal esophagus.      Grade II varices were found in the distal esophagus. They were 5 mm in       largest diameter.      Patchy, white plaques were found in the entire esophagus.      One  benign-appearing, intrinsic mild stenosis was found at the       gastroesophageal junction. This stenosis measured 1.5 cm (inner       diameter). The stenosis was traversed.      The exam of the esophagus was otherwise normal.      Moderate portal hypertensive gastropathy was found in the entire       examined stomach.      The exam of the stomach was otherwise normal.      Patchy focal moderately erythematous, congested mucosa without active       bleeding and with no stigmata of bleeding was found in the duodenal bulb       and descending duodenum c/w portal duodenopathy. Impression:               - LA Grade C reflux esophagitis with mild focal                            contact bleeding.                           - Grade II esophageal varices.                           - Esophageal plaques were found, consistent with  candidiasis.                           - Benign-appearing esophageal stenosis.                           - Portal hypertensive gastropathy.                           - Erythematous duodenopathy c/w portal duodenopathy.                           - No specimens collected. Moderate Sedation:      Not Applicable - Patient had care per Anesthesia. Recommendation:           - Return patient to hospital ward for ongoing care.                           - Soft diet today.                           - Continue present medications including PPI bid.                           - Esophagitis with mild focal contact bleeding,                            portal gastropathy, portal duodenopathy all                            potential sources of of bleeding with or without                            anticoagulation.                           - Consult to IR to consider TIPS.                           - Diflucan (fluconazole) 100 mg PO daily for 7 days.                           - Continue to hold Eliquis for now.                           - Outpatient GI follow  up with Dr. Silvano Rusk. Procedure Code(s):        --- Professional ---                           [phone], Esophagogastroduodenoscopy, flexible,                            transoral; diagnostic, including collection of                            specimen(s) by brushing or washing, when performed                            (  separate procedure) Diagnosis Code(s):        --- Professional ---                           K21.01, Gastro-esophageal reflux disease with                            esophagitis, with bleeding                           I85.00, Esophageal varices without bleeding                           K22.9, Disease of esophagus, unspecified                           K22.2, Esophageal obstruction                           K76.6, Portal hypertension                           K31.89, Other diseases of stomach and duodenum                           K92.1, Melena (includes Hematochezia) CPT copyright 2019 American Medical Association. All rights reserved. The codes documented in this report are preliminary and upon coder review may  be revised to meet current compliance requirements. Ladene Artist, MD 06/07/2019 8:34:44 AM This report has been signed electronically. Number of Addenda: 0

## 2019-06-07 NOTE — Progress Notes (Signed)
Physical Therapy Treatment Patient Details Name: Billy [name] MRN: [id number] DOB: 07-23-1945 Today's Date: 06/07/2019    History of Present Illness 73 y.o. male with past medical history of alcohol abuse, portal hypertension with varices, ascites, angiodysplasia of the colon status post APC treatment in the past, history of diverticulosis of colon, GERD and previous GI bleed,  depression, recent diagnosis of right lower extremity DVT on anticoagulation presented to hospital this time with complaints of rectal bleed. Pt s/p EGD, colonscopy, and IVC Filter 06/07/19.  Bedrest for 2 hr post IVC that was placed at 12:01 (RN reports order at 1406 as that was when she acknowledged - pt's 2 hrs are complete).    PT Comments    Pt making good progress.  He was able to ambulate in hall with min guard.  Pt does have mild decrease in balance.  His HR was still elevated but improved since last visit.  115 bpm rest and up to 125-135 bpm walking.   Follow Up Recommendations  Home health PT     Equipment Recommendations  Rolling walker with 5" wheels    Recommendations for Other Services       Precautions / Restrictions Precautions Precautions: Fall    Mobility  Bed Mobility Overal bed mobility: Modified Independent             General bed mobility comments: HOB up  Transfers Overall transfer level: Needs assistance Equipment used: None Transfers: Sit to/from Stand Sit to Stand: Min guard            Ambulation/Gait Ambulation/Gait assistance: Min guard Gait Distance (Feet): 200 Feet Assistive device: None Gait Pattern/deviations: Wide base of support;Drifts right/left     General Gait Details: increased lateral sway; knees flexed; reports slightly unsteady compared to normal;  HR 115 bpm rest and up to 125-130 bpm briefly at 135 bpm with activity taking 2 mins to recover   Stairs             Wheelchair Mobility    Modified Rankin (Stroke Patients Only)        Balance Overall balance assessment: Needs assistance Sitting-balance support: Feet supported;No upper extremity supported Sitting balance-Leahy Scale: Normal     Standing balance support: No upper extremity supported;During functional activity Standing balance-Leahy Scale: Good                              Cognition Arousal/Alertness: Awake/alert Behavior During Therapy: WFL for tasks assessed/performed Overall Cognitive Status: Within Functional Limits for tasks assessed                                        Exercises      General Comments        Pertinent Vitals/Pain Pain Assessment: No/denies pain    Home Living                      Prior Function            PT Goals (current goals can now be found in the care plan section) Progress towards PT goals: Progressing toward goals    Frequency    Min 3X/week      PT Plan Current plan remains appropriate    Co-evaluation              AM-PAC PT "  6 Clicks" Mobility   Outcome Measure  Help needed turning from your back to your side while in a flat bed without using bedrails?: None Help needed moving from lying on your back to sitting on the side of a flat bed without using bedrails?: None Help needed moving to and from a bed to a chair (including a wheelchair)?: None Help needed standing up from a chair using your arms (e.g., wheelchair or bedside chair)?: None Help needed to walk in hospital room?: None Help needed climbing 3-5 steps with a railing? : A Little 6 Click Score: 23    End of Session Equipment Utilized During Treatment: Gait belt Activity Tolerance: Patient tolerated treatment well Patient left: in bed;with bed alarm set;with call bell/phone within reach Nurse Communication: Mobility status PT Visit Diagnosis: Difficulty in walking, not elsewhere classified (R26.2);History of falling (Z91.81)     Time: [id number] PT Time Calculation (min)  (ACUTE ONLY): 20 min  Charges:  $Gait Training: 8-22 mins                     Maggie Font, PT Acute Rehab Services Pager [phone] [facility] [phone]    Karlton Lemon 06/07/2019, 3:41 PM

## 2019-06-07 NOTE — Progress Notes (Signed)
PT Cancellation Note  Patient Details Name: TAVON GONGWER MRN: [id number] DOB: [birth year]   Cancelled Treatment:    Reason Eval/Treat Not Completed: Active bedrest order  Pt s/p IVC filter placed and bedrest orders for 2 hours.  Ending at 1609.  Will f/u as able.    Mikael Spray [place] 06/07/2019, 2:09 PM

## 2019-06-07 NOTE — Progress Notes (Signed)
IR remains following pt for potential TIPS/BRTO procedure for refractory ascites and esophageal varices. Admitted for recurrent GI bleed. Eliquis had to be held. IVC filter placed today.  Pt was scheduled for CT BRTO protocol for tomorrow but we have moved this up to today.  IR will continue to follow for TIPS planning.  Ascencion Dike PA-C Interventional Radiology 06/07/2019 1:11 PM

## 2019-06-08 ENCOUNTER — Inpatient Hospital Stay (HOSPITAL_COMMUNITY): Admission: RE | Admit: 2019-06-08 | Payer: 59 | Source: Ambulatory Visit

## 2019-06-08 ENCOUNTER — Inpatient Hospital Stay (HOSPITAL_COMMUNITY): Payer: 59

## 2019-06-08 ENCOUNTER — Telehealth: Payer: Self-pay | Admitting: Internal Medicine

## 2019-06-08 DIAGNOSIS — I951 Orthostatic hypotension: Secondary | ICD-10-CM

## 2019-06-08 DIAGNOSIS — K3189 Other diseases of stomach and duodenum: Secondary | ICD-10-CM

## 2019-06-08 DIAGNOSIS — K7031 Alcoholic cirrhosis of liver with ascites: Secondary | ICD-10-CM

## 2019-06-08 LAB — COMPREHENSIVE METABOLIC PANEL
ALT: 24 U/L (ref 0–44)
AST: 33 U/L (ref 15–41)
Albumin: 2.2 g/dL — ABNORMAL LOW (ref 3.5–5.0)
Alkaline Phosphatase: 90 U/L (ref 38–126)
Anion gap: 10 (ref 5–15)
BUN: 31 mg/dL — ABNORMAL HIGH (ref 8–23)
CO2: 18 mmol/L — ABNORMAL LOW (ref 22–32)
Calcium: 8.3 mg/dL — ABNORMAL LOW (ref 8.9–10.3)
Chloride: 100 mmol/L (ref 98–111)
Creatinine, Ser: 1.56 mg/dL — ABNORMAL HIGH (ref 0.61–1.24)
GFR calc Af Amer: 51 mL/min — ABNORMAL LOW (ref >60)
GFR calc non Af Amer: 44 mL/min — ABNORMAL LOW (ref >60)
Glucose, Bld: 98 mg/dL (ref 70–99)
Potassium: 3.1 mmol/L — ABNORMAL LOW (ref 3.5–5.1)
Sodium: 128 mmol/L — ABNORMAL LOW (ref 135–145)
Total Bilirubin: 0.7 mg/dL (ref 0.3–1.2)
Total Protein: 5.5 g/dL — ABNORMAL LOW (ref 6.5–8.1)

## 2019-06-08 LAB — HEMOGLOBIN AND HEMATOCRIT, BLOOD
HCT: 25.4 % — ABNORMAL LOW (ref 39.0–52.0)
HCT: 27.1 % — ABNORMAL LOW (ref 39.0–52.0)
HCT: 25.5 % — ABNORMAL LOW (ref 39.0–52.0)
HCT: 25.2 % — ABNORMAL LOW (ref 39.0–52.0)
Hemoglobin: 7.6 g/dL — ABNORMAL LOW (ref 13.0–17.0)
Hemoglobin: 8.1 g/dL — ABNORMAL LOW (ref 13.0–17.0)
Hemoglobin: 7.9 g/dL — ABNORMAL LOW (ref 13.0–17.0)
Hemoglobin: 7.7 g/dL — ABNORMAL LOW (ref 13.0–17.0)

## 2019-06-08 LAB — AMMONIA: Ammonia: 37 umol/L — ABNORMAL HIGH (ref 9–35)

## 2019-06-08 MED ORDER — PANTOPRAZOLE SODIUM 40 MG PO TBEC
40.0000 mg | DELAYED_RELEASE_TABLET | Freq: Two times a day (BID) | ORAL | Status: DC
  Administered 2019-06-08 – 2019-06-09 (×2): 40 mg via ORAL
  Filled 2019-06-08 (×2): qty 1

## 2019-06-08 MED ORDER — POTASSIUM CHLORIDE 20 MEQ PO PACK
40.0000 meq | PACK | Freq: Once | ORAL | Status: AC
  Administered 2019-06-08: 40 meq via ORAL
  Filled 2019-06-08: qty 2

## 2019-06-08 MED ORDER — POTASSIUM CHLORIDE CRYS ER 20 MEQ PO TBCR
40.0000 meq | EXTENDED_RELEASE_TABLET | Freq: Once | ORAL | Status: AC
  Administered 2019-06-08: 40 meq via ORAL
  Filled 2019-06-08: qty 2

## 2019-06-08 NOTE — Progress Notes (Signed)
Patient ID: Billy [name], male   DOB: [birth year], 73 y.o.   MRN: [id number] Drs. Laurence Ferrari and Carlean Purl have spoken this morning regarding potential TIPS procedure on pt. At this time GI will obtain a renal consult and try to medically optimize the patient. If his renal function and hyponatremia improve patient can be referred to IR clinic [phone]) to further evaluate for possible TIPS.  Urgent TIPS currently not indicated now that he is off anticoagulation and is showing no signs of continued variceal bleeding.

## 2019-06-08 NOTE — Consult Note (Signed)
Renal Service Consult Note [facility]  Billy [name] 06/08/2019 Sol Blazing Requesting Physician:  Dr Earnest Conroy, Delane Ginger.   Reason for Consult: AKI, cirrhosis HPI: The patient is a 72 y.o. year-old presented 11/20 w/ GI bleeding , creat 1.8 and Hb 7.4.  Hx of cirrhosis w/ esoph varices/ ascites/ low plts. Pt had recent admit for melena in early Nov 2020. Had mild AKI during that stay rx'd w/ midodrine. Had AME w/ normal NH3, was dc'd on lactulose. Pt was still drinking at that time, Child Class C cirrhosis f/b GI.  This admit pt has ^'d creatinine and hyponatremia which are hampering pt's chances for getting a TIPS procedure.  Asked to see for AKI/ hyponatremia.      Na+ levels here in Nov 2020 are 126- 133.  Today is 128, yest 129.    K+ levels here are 4.7 on admit 11/13, 4.2 on admit 11/16, down to 3.9 yest and 3.1 today.       Patient is having diarreha from the lactulose. He is on fluid restriciton of 1200 cc. No SOB. Legs are edematous, more than usual.  Ascites is mild and at baseline per pateint.   UOP is low here, UNa is low.    ROS  denies CP  no joint pain   no HA  no blurry vision  no rash  no diarrhea  no nausea/ vomiting     Past Medical History  Past Medical History:  Diagnosis Date  . Acute deep vein thrombosis (DVT) of femoral vein of right lower extremity ([place]) 03/30/2019  . Acute posthemorrhagic anemia   . Alcoholic cirrhosis of liver ([place]) 09/24/2011   Years of alcohol use. Progressed from fatty liver to cirrhosis based upon review imaging. Manifestations are portal hypertension with esophageal varices, portal gastropathy, rectal varices and he may have portal colopathy. Ascites also present. Naive to HAV and HBV Claims he has had pneumococcal vaccine   . Alcoholism ([place])   . Angiodysplasia of colon 09/11/2011   Multiple in the right colon, ablated with APC. Question if this is portal colopathy.   . Anxiety   . Back pain   . Basal cell  carcinoma   . Bilateral varicoceles   . Campylobacter diarrhea 01/07/2012  . Cataract    removed  . DDD (degenerative disc disease), lumbar   . Depression    mild at most  . Diverticulosis of colon   . Erectile dysfunction   . Gastric ulcer 08/2011   small, antral ulcer  . GERD (gastroesophageal reflux disease)    takes Omeprazole prn  . GI hemorrhage 2006, 2013   esophagitis and 1+ varices, melena in 2013   . Grade I diastolic dysfunction   . Hearing loss   . History of ascites   . History of blood transfusion    as a child and no abnormal reaction noted  . History of bronchitis    >21yr ago  . History of dizziness   . Hydrocele   . Hypertension    takes Nadolol daily  . [place]   . Personal history of colonic polyps   . Portal hypertension ([place] 08/2011  . 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 Right hydrocele   . Scrotal edema   . Sleep disturbance    takes Remeron nightly  . Splenomegaly   . [place]rombocytopenia, unspecified ([place]   . Varices, esophageal ([place] 08/2011   Grade 2, mid-esophagus   Past Surgical History  Past Surgical History:  Procedure Laterality Date  . BIOPSY  08/27/2018   Procedure: BIOPSY;  Surgeon: Milus Banister, MD;  Location: [facility];  Service: Endoscopy;;  . CATARACT EXTRACTION  2013   bilateral with IOL  . CHOLECYSTECTOMY  12/31/2012   Procedure: LAPAROSCOPIC CHOLECYSTECTOMY;  Surgeon: Zenovia Jarred, MD;  Location: [place];  Service: General;;  . COLONOSCOPY  2001, 200411/02/2006  . COLONOSCOPY  09/11/2011   Procedure: COLONOSCOPY;  Surgeon: Gatha Mayer, MD;  Location: [facility];  Service: Endoscopy;  Laterality: N/A;  . COLONOSCOPY    . ESOPHAGEAL BANDING N/A 05/02/2017   Procedure: ESOPHAGEAL BANDING;  Surgeon: Gatha Mayer, MD;  Location: [facility];  Service: Endoscopy;  Laterality: N/A;  . ESOPHAGEAL BANDING  06/09/2018   Procedure: ESOPHAGEAL BANDING;  Surgeon: Gatha Mayer, MD;  Location: [facility];   Service: Endoscopy;;  . ESOPHAGEAL BANDING  08/27/2018   Procedure: ESOPHAGEAL BANDING;  Surgeon: Milus Banister, MD;  Location: [facility];  Service: Endoscopy;;  . ESOPHAGOGASTRODUODENOSCOPY  2006  . ESOPHAGOGASTRODUODENOSCOPY  09/11/2011   Procedure: ESOPHAGOGASTRODUODENOSCOPY (EGD);  Surgeon: Gatha Mayer, MD;  [facility];  Service: Endoscopy;  Laterality: N/A;  . ESOPHAGOGASTRODUODENOSCOPY N/A 08/27/2018   Procedure: ESOPHAGOGASTRODUODENOSCOPY (EGD);  Surgeon: Milus Banister, MD;  [facility];  Service: Endoscopy;  Laterality: N/A;  . ESOPHAGOGASTRODUODENOSCOPY (EGD) WITH PROPOFOL N/A 04/13/2017   Procedure: ESOPHAGOGASTRODUODENOSCOPY (EGD) WITH PROPOFOL;  Surgeon: Doran Stabler, MD;  Location: [facility];  Service: Gastroenterology;  Laterality: N/A;  . ESOPHAGOGASTRODUODENOSCOPY (EGD) WITH PROPOFOL N/A 05/02/2017   Procedure: ESOPHAGOGASTRODUODENOSCOPY (EGD) WITH PROPOFOL;  Surgeon: Gatha Mayer, MD;  [facility]: [facility];  Service: Endoscopy;  Laterality: N/A;  . ESOPHAGOGASTRODUODENOSCOPY (EGD) WITH PROPOFOL N/A 09/30/2017   Procedure: ESOPHAGOGASTRODUODENOSCOPY (EGD) WITH PROPOFOL;  Surgeon: Gatha Mayer, MD;  [facility]: [facility];  Service: Endoscopy;  Laterality: N/A;  . ESOPHAGOGASTRODUODENOSCOPY (EGD) WITH PROPOFOL N/A 06/09/2018   Procedure: ESOPHAGOGASTRODUODENOSCOPY (EGD) WITH PROPOFOL;  Surgeon: Gatha Mayer, MD;  [facility]: [facility];  Service: Endoscopy;  Laterality: N/A;  . ESOPHAGOGASTRODUODENOSCOPY (EGD) WITH PROPOFOL N/A 01/05/2019   Procedure: ESOPHAGOGASTRODUODENOSCOPY (EGD) WITH PROPOFOL;  Surgeon: Gatha Mayer, MD;  [facility];  Service: Endoscopy;  Laterality: N/A;  . ESOPHAGOGASTRODUODENOSCOPY (EGD) WITH PROPOFOL N/A 05/30/2019   Procedure: ESOPHAGOGASTRODUODENOSCOPY (EGD) WITH PROPOFOL;  Surgeon: Carol Ada, MD;  Location: [facility];  Service: Endoscopy;  Laterality: N/A;  . EUS N/A 08/27/2018    Procedure: UPPER ENDOSCOPIC ULTRASOUND (EUS) RADIAL;  Surgeon: Milus Banister, MD;  Location: [facility];  Service: Endoscopy;  Laterality: N/A;  . EYE SURGERY    . FLEXIBLE SIGMOIDOSCOPY  01/07/2012   Procedure: FLEXIBLE SIGMOIDOSCOPY;  Surgeon: Lafayette Dragon, MD;  Location: [facility];  Service: Endoscopy;  Laterality: N/A;  . IR IVC FILTER PLMT / S&I Burke Keels GUID/MOD SED  06/07/2019  . IR RADIOLOGIST EVAL & MGMT  05/26/2019  . KNEE ARTHROSCOPY  2008   Right  . KNEE ARTHROSCOPY  2008   Left  . POLYPECTOMY    . [place] THORA/PARACENTESIS  10/28/2011  . Varicocele repair  74's   Family History  Family History  Problem Relation Age of Onset  . Stroke Mother   . Diabetes Mother   . Lung cancer Brother   . Prostate cancer Brother   . Liver cancer Cousin   . Liver cancer Paternal Uncle   . Colon cancer Neg Hx   . Stomach cancer Neg Hx   . Esophageal cancer  Neg Hx   . Colon polyps Neg Hx   . Rectal cancer Neg Hx   . Pancreatic cancer Neg Hx    Social History  reports that he has been smoking cigarettes and e-cigarettes. He has been smoking about 0.50 packs per day. He has never used smokeless tobacco. He reports previous alcohol use. He reports current drug use. Drug: Marijuana. Allergies  Allergies  Allergen Reactions  . [place] Venom Anaphylaxis, Shortness Of Breath and Other (See Comments)    Throat closes up/eye swell shut  . Nsaids Other (See Comments)    Stomach pain, Bleeding risk   Home medications Prior to Admission medications   Medication Sig Start Date End Date Taking? Authorizing Provider  apixaban (ELIQUIS) 5 MG TABS tablet Take 1 tablet (5 mg total) by mouth 2 (two) times daily. 03/31/19  Yes Barton Dubois, MD  buPROPion [facility] SR) 150 MG 12 hr tablet TAKE 1 TABLET BY MOUTH EVERY DAY 06/01/19  Yes Viviana Simpler I, MD  ferrous sulfate 325 (65 FE) MG EC tablet Take 325 mg by mouth every other day.   Yes [provider]  fluocinonide cream  (LIDEX) 8.25 % Apply 1 application topically 2 (two) times daily as needed (for skin rash/irritation). APPLY ON THE SKIN TWICE A DAY AS NEEDED FOR RASH 12/30/18  Yes [name], MD  HYDROcodone-acetaminophen (NORCO/VICODIN) 5-325 MG tablet Take 1 tablet by mouth 3 (three) times daily as needed (for pain.). 05/27/19  Yes [name], MD  lactulose (CHRONULAC) 10 GM/15ML solution Take 45 mLs (30 g total) by mouth 2 (two) times daily. 05/31/19  Yes Harold Hedge, MD  midodrine (PROAMATINE) 2.5 MG tablet Take 1 tablet (2.5 mg total) by mouth 3 (three) times daily with meals. 05/11/19  Yes Gatha Mayer, MD  mirtazapine (REMERON) 45 MG tablet TAKE 1 TABLET (45 MG TOTAL) BY MOUTH AT BEDTIME. 05/12/19  Yes [name], MD  Multiple Vitamin (MULTIVITAMIN WITH MINERALS) TABS Take 1 tablet by mouth daily.    Yes [provider]  psyllium (METAMUCIL) 58.6 % powder Take 1 packet by mouth at bedtime.    Yes [provider]  sucralfate (CARAFATE) 1 GM/10ML suspension Take 10 mLs (1 g total) by mouth 3 (three) times daily between meals for 7 days. 05/31/19 06/07/19 Yes Harold Hedge, MD  vitamin C (ASCORBIC ACID) 250 MG tablet Take 250 mg by mouth daily.    Yes [provider]  pantoprazole (PROTONIX) 40 MG tablet Take 1 tablet (40 mg total) by mouth 2 (two) times daily for 30 days, THEN 1 tablet (40 mg total) daily. Patient not taking: Reported on 06/04/2019 05/31/19 07/30/19  Harold Hedge, MD  spironolactone (ALDACTONE) 50 MG tablet Take 200 mg by mouth daily. 06/01/19   [provider]   Liver Function Tests Recent Labs  Lab 06/04/19 1150 06/05/19 0159 06/06/19 0157 06/08/19 0810  AST 36 35  --  33  ALT 22 21  --  24  ALKPHOS 85 79  --  90  BILITOT 1.0 0.4  --  0.7  PROT 6.1* 5.3*  --  5.5*  ALBUMIN 2.6* 2.1* 2.0* 2.2*   No results for input(s): LIPASE, AMYLASE in the last 168 hours. CBC Recent Labs  Lab 06/03/19 1210 06/04/19 1150   06/05/19 0159  06/06/19 0157  06/08/19 0218 06/08/19 0810 06/08/19 1418  WBC 7.5 12.5*  --  10.0  --  10.9*  --   --   --   --  NEUTROABS 6.3 10.6*  --   --   --  9.0*  --   --   --   --   HGB 7.4 Repeated and verified X2.* 7.6*   < > 7.5*  7.7*   < > 7.5*   < > 7.6* 8.1* 7.9*  HCT 22.8 Repeated and verified X2.* 25.3*   < > 24.6*  25.1*   < > 24.9*   < > 25.4* 27.1* 25.5*  MCV 94.3 100.0  --  96.2  --  100.0  --   --   --   --   PLT 177.0 225  --  146*  --  158  --   --   --   --    < > = values in this interval not displayed.   Basic Metabolic Panel Recent Labs  Lab 06/03/19 1210 06/04/19 1150 06/05/19 0159 06/06/19 0157 06/08/19 0810  NA 127* 128* 130* 129* 128*  K 4.5 3.8 4.0 3.9 3.1*  CL 99 97* 102 104 100  CO2 21 19* 18* 16* 18*  GLUCOSE 170* 107* 127* 106* 98  BUN 28* 38* 35* 37* 31*  CREATININE 1.50 1.89* 1.75* 1.85* 1.56*  CALCIUM 8.3* 8.3* 8.1* 8.1* 8.3*  PHOS  --   --   --  3.6  --    Iron/TIBC/Ferritin/ %Sat    Component Value Date/Time   IRON 77 03/30/2019 1602   TIBC 329 03/30/2019 1602   FERRITIN 22.4 05/21/2019 1512   IRONPCTSAT 23 03/30/2019 1602    Vitals:   06/08/19 0853 06/08/19 1200 06/08/19 1300 06/08/19 1321  BP: (!) 114/49   (!) 159/66  Pulse: 85  99 (!) 119  Resp: 15  16 18   Temp:  97.9 F (36.6 C)    TempSrc:  Oral    SpO2: 95%  98% 99%  Weight:      Height:        Exam Gen alert, n o distress, cachectic a bit No rash, cyanosis or gangrene Sclera anicteric, throat clear  No jvd or bruits Chest clear bilat to bases RRR no MRG Abd soft ntnd no mass , mild ascites, nontender GU normal male  MS no joint effusions or deformity Ext 2+ bilat LE edema, no wounds or ulcers Neuro is alert, Ox 3 , nf, no flap    Date   Creat   eGFR  2007- 2018  0.6- 0.95  2019   0.96- 1.23 December 2018  1.08  July   1.45  Aug   1.30     Sept 2020  1.77 > 1.25  38 > 58 ml/min  Oct    1.77  May 21, 2019  1.79     Nov 13- 16, 2020 2.51 >>  1.50  25 >> 43 ml/min   Nov 20- 24, 2020 1.89 >> 1.56  35 >> 44 ml/min    Home meds:  - midodrine 2.5 tid  - apixaban 5 bid  - lactulose 30gm bid/ sucralfate 1 gm tid x 7d/ pantoprazole 40 qd  - mirtazapine 45 hs/ hydrocodone prn/ bupropion 150 qd  - spironolactone 200 qd  - prn's/ vitamins/ supplements       UNa 06/06/2019 < 10    Uosm 452   UA not done     Abd [place] 06/08/2019 > 9- 11 cm kidneys w/o hydro, normal echotexture      Assessment/ Plan: 1. Hyponatremia - recommend TED hose, K+ correction and avoid hypotension. He  is not taking any BP lowering meds.  Fluid restriction does not help hypoNa+ in cirrhosis pt's but would keep it anyways. Salt tabs/ vaptans and urea not recommended in advanced cirrhosis.  2. Renal failure - pt has CKD stage III w/ baseline creat 1.5, eGFR 40- 45 ml/min at baseline. 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 ECHO recently showed nomral LVEF.  Renal function has deteriorated modestly over the last few yrs but more significantly over the last few months. 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  Midodrine is on hold for higher BP's. Will await studies and follow up tomorrow.  3. Volume overload - mild ascites, clear lungs but sig leg edema. Will hold off on loop diuretics w/ Na+ avid urine state for now.  Will d/w GI as well.  4. Cirrhosis /etoh related - not Tx candidate at this time 5. Hx GI bleeds       Kelly Splinter  MD 06/08/2019, 2:32 PM

## 2019-06-08 NOTE — Progress Notes (Addendum)
PROGRESS NOTE    Billy [name]  [id number]  DOB: Sep 02, 1945  DOA: 06/04/2019 PCP: [name], MD  Brief Narrative:  73-y/o male with h/o  alcohol abuse,liver cirrhosis complicated by portal hypertension/eso varices/ascites/hepatic encephalopathy/thrombocytopenia, GERD, angiodysplasia of the colon requiring APC, diverticulosis,depression who wasrecently diagnosed with RLE DVT on 03/30/19 and started on Eliquis, was admitted to this medical center 11/13-11/16 for UGIB, hepatic encephalopathy and AKI. He was seen by GI and underwent upper endoscopy showing severe erosive esophagitis, mild portal hypertensive gastropathy, small bowel AVMs which were cauterized. He was treated conservatively with PPI, carafate, blood transfusion, and supportive care with improvement. His RLE DVT was re-imaged 05/31/19 during his previous admission and showed ongoing deep vein thrombosis involving the right common femoral vein, right femoral vein, and right popliteal vein.He was discharged with instructions to resume anticoagulation.  [facility] c/o melena and BRBPR at home. Hgb on admission was 7.4. His Eliquis has been held. He was transfused 1u PRBC with improvement in Hgb to 8.1. GI consulted and plan for colonoscopy in am. IR consulted for IVC filter placement. Serum creatinine of 1.75 also noted on presentation, with baseline serum creatinine of 1.3. Patient is currently on IV Protonix 40 mg twice daily, sucralfate and IV Rocephin for SBP prophylaxis  Subjective:  Patient underwent IVC filter placement through IR yesterday and tolerated well. Denies any respiratory complaints but appears somewhat tachypneic while talking.  Noted to have mild to moderate ascites and patient states it has recurred since last paracentesis.  Objective: Vitals:   06/08/19 1200 06/08/19 1300 06/08/19 1321 06/08/19 1612  BP:   (!) 159/66 (!) 150/55  Pulse:  99 (!) 119 (!) 111  Resp:  16 18  (!) 21  Temp: 97.9 F (36.6 C)   97.8 F (36.6 C)  TempSrc: Oral   Oral  SpO2:  98% 99% 99%  Weight:      Height:        Intake/Output Summary (Last 24 hours) at 06/08/2019 1920 Last data filed at 06/08/2019 1900 Gross per 24 hour  Intake 713.33 ml  Output --  Net 713.33 ml   Filed Weights   06/05/19 0500 06/06/19 0448 06/07/19 0327  Weight: 78.1 kg 80.5 kg 78.3 kg    Physical Examination:  General exam: Appears calm and comfortable  Respiratory system: Clear to auscultation. Respiratory effort normal. Cardiovascular system: S1 & S2 heard, RRR. No JVD, murmurs, rubs, gallops or clicks. No pedal edema. Gastrointestinal system: Abdomen is moderately distended with ascites, soft and nontender. Normal bowel sounds heard. Central nervous system: Alert and oriented. No focal neurological deficits. Extremities: Symmetric 5 x 5 power.  Right lower extremity mildly swollen compared to left but no significant edema noted. Skin: No rashes, lesions or ulcers Psychiatry: Judgement and insight appear normal. Mood & affect appropriate.     Data Reviewed: I have personally reviewed following labs and imaging studies  CBC: Recent Labs  Lab 06/03/19 1210 06/04/19 1150  06/05/19 0159  06/06/19 0157  06/07/19 1702 06/07/19 2239 06/08/19 0218 06/08/19 0810 06/08/19 1418  WBC 7.5 12.5*  --  10.0  --  10.9*  --   --   --   --   --   --   NEUTROABS 6.3 10.6*  --   --   --  9.0*  --   --   --   --   --   --   HGB 7.4 Repeated and verified  X2.* 7.6*   < > 7.5*   7.7*   < > 7.5*   < > 7.8* 7.6* 7.6* 8.1* 7.9*  HCT 22.8 Repeated and verified X2.* 25.3*   < > 24.6*   25.1*   < > 24.9*   < > 25.2* 25.7* 25.4* 27.1* 25.5*  MCV 94.3 100.0  --  96.2  --  100.0  --   --   --   --   --   --   PLT 177.0 225  --  146*  --  158  --   --   --   --   --   --    < > = values in this interval not displayed.   Basic Metabolic Panel: Recent Labs  Lab 06/03/19 1210 06/04/19 1150 06/05/19 0159  06/06/19 0157 06/08/19 0810  NA 127* 128* 130* 129* 128*  K 4.5 3.8 4.0 3.9 3.1*  CL 99 97* 102 104 100  CO2 21 19* 18* 16* 18*  GLUCOSE 170* 107* 127* 106* 98  BUN 28* 38* 35* 37* 31*  CREATININE 1.50 1.89* 1.75* 1.85* 1.56*  CALCIUM 8.3* 8.3* 8.1* 8.1* 8.3*  MG  --   --  2.0  --   --   PHOS  --   --   --  3.6  --    GFR: Estimated Creatinine Clearance: 48.1 mL/min (A) (by C-G formula based on SCr of 1.56 mg/dL (H)). Liver Function Tests: Recent Labs  Lab 06/04/19 1150 06/05/19 0159 06/06/19 0157 06/08/19 0810  AST 36 35  --  33  ALT 22 21  --  24  ALKPHOS 85 79  --  90  BILITOT 1.0 0.4  --  0.7  PROT 6.1* 5.3*  --  5.5*  ALBUMIN 2.6* 2.1* 2.0* 2.2*   No results for input(s): LIPASE, AMYLASE in the last 168 hours. Recent Labs  Lab 06/08/19 1111  AMMONIA 37*   Coagulation Profile: Recent Labs  Lab 06/05/19 0159 06/07/19 0224  INR 1.7* 1.4*   Cardiac Enzymes: No results for input(s): CKTOTAL, CKMB, CKMBINDEX, TROPONINI in the last 168 hours. BNP (last 3 results) No results for input(s): PROBNP in the last 8760 hours. HbA1C: No results for input(s): HGBA1C in the last 72 hours. CBG: No results for input(s): GLUCAP in the last 168 hours. Lipid Profile: No results for input(s): CHOL, HDL, LDLCALC, TRIG, CHOLHDL, LDLDIRECT in the last 72 hours. Thyroid Function Tests: No results for input(s): TSH, T4TOTAL, FREET4, T3FREE, THYROIDAB in the last 72 hours. Anemia Panel: No results for input(s): VITAMINB12, FOLATE, FERRITIN, TIBC, IRON, RETICCTPCT in the last 72 hours. Sepsis Labs: No results for input(s): PROCALCITON, LATICACIDVEN in the last 168 hours.  Recent Results (from the past 240 hour(s))  Body fluid culture     Status: None   Collection Time: 05/30/19 11:38 AM   Specimen: PATH Cytology Peritoneal fluid  Result Value Ref Range Status   Specimen Description   Final    PERITONEAL Performed at [place] [address]  [place], [place] [address]    Special Requests   Final    NONE Performed at [facility], [place] [address], [place], [place] [address]    Gram Stain NO WBC SEEN NO ORGANISMS SEEN   Final   Culture   Final    NO GROWTH 3 DAYS Performed at [facility], [place] [address], [place], [place] [address]    Report Status 06/02/2019  FINAL  Final  Culture, body fluid-bottle     Status: None   Collection Time: 05/30/19 11:38 AM   Specimen: Peritoneal Washings  Result Value Ref Range Status   Specimen Description PERITONEAL  Final   Special Requests NONE  Final   Culture   Final    NO GROWTH 5 DAYS Performed at [facility], [address] [place], [place] [address]    Report Status 06/04/2019 FINAL  Final  Gram stain     Status: None   Collection Time: 05/30/19 11:38 AM   Specimen: Peritoneal Washings  Result Value Ref [place] Status   Specimen Description PERITONEAL  Final   Special Requests NONE  Final   Gram Stain   Final    RARE WBC PRESENT, PREDOMINANTLY MONONUCLEAR NO ORGANISMS SEEN Performed at [facility], [place] [address] [place], [place] [address]    Report Status 05/31/2019 FINAL  Final  SARS CORONAVIRUS 2 (TAT 6-24 HRS) Nasopharyngeal Nasopharyngeal Swab     Status: None   Collection Time: 06/04/19  1:57 PM   Specimen: Nasopharyngeal Swab  Result Value Ref [place] Status   SARS Coronavirus 2 NEGATIVE NEGATIVE Final    Comment: (NOTE) SARS-CoV-2 target nucleic acids are NOT DETECTED. The SARS-CoV-2 RNA is generally detectable in upper and lower respiratory specimens during the acute phase of infection. Negative results do not preclude SARS-CoV-2 infection, do not rule out co-infections with other pathogens, and should not be used as the sole basis for treatment or other patient management decisions. Negative results must be combined with clinical observations, patient history, and epidemiological information. The expected result is  Negative. Fact Sheet for Patients: SugarRoll.be Fact Sheet for Healthcare Providers: https://www.woods-mathews.com/ This test is not yet approved or cleared by the [place] FDA and  has been authorized for detection and/or diagnosis of SARS-CoV-2 by FDA under an Emergency Use Authorization (EUA). This EUA will remain  in effect (meaning this test can be used) for the duration of the COVID-19 declaration under Section 56 4(b)(1) of the Act, 21 U.S.C. section 360bbb-3(b)(1), unless the authorization is terminated or revoked sooner. Performed at [facility], [place] [address], [place], [place] [address]   MRSA PCR Screening     Status: None   Collection Time: 06/04/19  6:13 PM   Specimen: Nasal Mucosa; Nasopharyngeal  Result Value Ref [place] Status   MRSA by PCR NEGATIVE NEGATIVE Final    Comment:        The GeneXpert MRSA Assay (FDA approved for NASAL specimens only), is one component of a comprehensive MRSA colonization surveillance program. It is not intended to diagnose MRSA infection nor to guide or monitor treatment for MRSA infections. Performed at [facility], [place] [address], [place], [place] [address]       Radiology Studies: Ir Ivc Filter Plmt / S&i /img Guid/mod Sed  Result Date: 06/07/2019 INDICATION: 73 year old with recent diagnosis of right lower extremity DVT in September 2020. He was subsequently placed on Eliquis but presents with bleeding of esophageal/gastric varices. He has cirrhosis and portal hypertension. This represents an absolute contraindication to further anticoagulation and he is therefore a candidate for PE prophylaxis by caval interruption. He presents for placement of an IVC filter. EXAM: ULTRASOUND GUIDANCE FOR VASCULARACCESS IVC CATHETERIZATION AND VENOGRAM IVC FILTER INSERTION Interventional Radiologist:  Criselda Peaches, MD MEDICATIONS: None. ANESTHESIA/SEDATION: Fentanyl  100 mcg IV; Versed 2 mg IV Moderate Sedation Time:  12 minutes The patient was  continuously monitored during the procedure by the interventional radiology nurse under my direct supervision. FLUOROSCOPY TIME:  Fluoroscopy Time: 0 minutes 30 seconds (165 mGy). COMPLICATIONS: None immediate. PROCEDURE: Informed written consent was obtained from the patient after a thorough discussion of the procedural risks, benefits and alternatives. All questions were addressed. Maximal Sterile Barrier Technique was utilized including caps, mask, sterile gowns, sterile gloves, sterile drape, hand hygiene and skin antiseptic. A timeout was performed prior to the initiation of the procedure. Maximal barrier sterile technique utilized including caps, mask, sterile gowns, sterile gloves, large sterile drape, hand hygiene, and Betadine prep. Under sterile condition and local anesthesia, right internal jugular venous access was performed with ultrasound. An ultrasound image was saved and sent to PACS. Over a guidewire, the IVC filter delivery sheath and inner dilator were advanced into the IVC just above the IVC bifurcation. Contrast injection was performed for an IVC venogram. Through the delivery sheath, a retrievable Denali IVC filter was deployed below the level of the renal veins and above the IVC bifurcation. Limited post deployment venacavagram was performed. The delivery sheath was removed and hemostasis was obtained with manual compression. A dressing was placed. The patient tolerated the procedure well without immediate post procedural complication. FINDINGS: The IVC is patent. No evidence of thrombus, stenosis, or occlusion. No variant venous anatomy. Successful placement of the IVC filter below the level of the renal veins. IMPRESSION: Successful ultrasound and fluoroscopically guided placement of an infrarenal retrievable IVC filter via right jugular approach. PLAN: This IVC filter is potentially retrievable. The patient will  be assessed for filter retrieval by Interventional Radiology in approximately 8-12 weeks. Further recommendations regarding filter retrieval, continued surveillance or declaration of device permanence, will be made at that time. Electronically Signed   By: Jacqulynn Cadet M.D.   On: 06/07/2019 13:10   Ct Angio Abd/pel W/ And/or W/o  Result Date: 06/07/2019 CLINICAL DATA:  73 year old male with alcoholic cirrhosis complicated by ascites and bleeding esophageal varices. Bleeding occurred in the setting of anticoagulation for recent right lower extremity DVT. An IVC filter was just placed. Evaluate anatomy for possible TIPS versus BRTO. EXAM: CTA ABDOMEN AND PELVIS WITHOUT AND WITH CONTRAST TECHNIQUE: Multidetector CT imaging of the abdomen and pelvis was performed using the standard protocol during bolus administration of intravenous contrast. Multiplanar reconstructed images and MIPs were obtained and reviewed to evaluate the vascular anatomy. CONTRAST:  53mL OMNIPAQUE IOHEXOL 350 MG/ML SOLN COMPARISON:  None. FINDINGS: VASCULAR Aorta: Heterogeneous atherosclerotic plaque throughout the abdominal aorta. No evidence of aneurysm or dissection. Celiac: Patent without evidence of aneurysm, dissection, vasculitis or significant stenosis. SMA: Patent without evidence of aneurysm, dissection, vasculitis or significant stenosis. Replaced right hepatic artery. Renals: Solitary renal arteries bilaterally. Calcified atherosclerotic plaque at the origins without significant stenosis. IMA: Patent without evidence of aneurysm, dissection, vasculitis or significant stenosis. Inflow: Calcified atherosclerotic plaque without significant stenosis or occlusion. Proximal Outflow: Calcified atherosclerotic plaque without significant stenosis or occlusion. Veins: The right and left portal veins are patent. Nonocclusive thrombus is present in the main portal vein. I patent channel persists. The splenic vein is widely patent.  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 The presence of calcification associated with the thrombus suggests that this is a chronic process. No significant gastro renal shunt or gastric varices. Review of the MIP images confirms the above findings. NON-VASCULAR Lower chest: Small right pleural effusion with associated right lower lobe atelectasis. The visualized heart is normal in size. Calcifications present along the  coronary arteries. No pericardial effusion. Hepatobiliary: Morphologically cirrhotic liver. The liver is small and extremely nodular. Small region of non masslike arterial hyperenhancement in hepatic segment 3 measures approximately 1.4 cm. No convincing evidence of washout on the delayed images. The gallbladder is surgically absent. Pancreas: Diffuse atrophy of the pancreas. Spleen: Mild splenomegaly. Adrenals/Urinary Tract: Normal adrenal glands. No evidence of hydronephrosis, nephrolithiasis or enhancing renal mass. Unremarkable ureters and bladder. Stomach/Bowel: Mild submucosal edema throughout the small bowel consistent with portal enteropathy. Colonic diverticular disease without CT evidence of active inflammation. Normal appendix. Lymphatic: No suspicious adenopathy. Reproductive: Prostate is unremarkable. Other: Large volume ascites. Mesenteric edema. Right inguinal hernia with ascites extending through the inguinal canal and into the scrotum. Musculoskeletal: The bones are osteopenic, particularly in the pelvis and proximal femurs. No discrete lytic or blastic osseous lesion. Multilevel degenerative disc disease. Bilateral lower lumbar facet arthropathy. Degenerative changes at the disc spacer most significant at L5-S1. There is mild degenerative anterolisthesis of L4 on L5. IMPRESSION: VASCULAR 1. 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 Anatomically, patient could be considered for TIPS creation. 2. 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 3. No evidence of gastro renal shunt or gastric varices. 4. Replaced right hepatic artery. 5.  Aortic Atherosclerosis (ICD10-170.0). NON-VASCULAR 1. Small focus of non-masslike arterial hyperenhancement in hepatic segment 3 measures up to 1.4 cm but demonstrates no definitive washout on the portal venous phase imaging. Given the non masslike appearance, this is favored to represent a small transient hepatic attenuation difference due to a vascular anomaly. However, in the setting of cirrhosis, early hepatocellular carcinoma is difficult to exclude entirely. Recommend further evaluation with MRI of the abdomen enhanced with gadolinium contrast. 2. Large ascites extending through a right inguinal hernia into the scrotum. 3. Small right pleural effusion and associated atelectasis. 4. Colonic diverticular disease without CT evidence of active inflammation. 5. Lower lumbar degenerative disc disease and facet arthropathy. Signed, Criselda Peaches, MD, [facility] Rad[facility] Electronically Signed   By: Jacqulynn Cadet M.D.   On: 06/07/2019 15:11        Scheduled Meds:  buPROPion  150 mg Oral Daily   Chlorhexidine Gluconate Cloth  6 each Topical Daily   fluconazole  100 mg Oral Daily   guaiFENesin  600 mg Oral BID   lactulose  20 g Oral Daily   mirtazapine  45 mg Oral QHS   pantoprazole (PROTONIX) IV  40 mg Intravenous Q12H   silver sulfADIAZINE   Topical BID   sucralfate  1 g Oral Q6H   Continuous Infusions:  sodium chloride Stopped (06/04/19 2203)   cefTRIAXone (ROCEPHIN)  IV Stopped (06/08/19 1812)    Assessment & Plan:   1. Recurrent upper and lower GI bleed:  In the setting of thrombocytopenia/coagulopathy.  Patient denies recent alcohol use.  S/P EGD in recent admission showing  severe erosive esophagitis--felt to be source of  acute bleeding, also noted to have non bleeding large esophageal varices , mild portal hypertensive gastropathy, small bowel AVMs which were cauterized.  Anticoagulation held on admission. S/p 1 unit PRBC in this admission. No active bleeding currently. Hgb  Stable around 7.5-8.2.patient on IV PPI twice daily--will change to oral, continue sucralfate.   EGD 11/23 findings : LA Grade C reflux esophagitis with mild focal contact bleeding. - Grade II esophageal varices. - Esophageal plaques were found, consistent with candidiasis. -  Benign-appearing esophageal stenosis. - Portal hypertensive gastropathy. - Erythematous duodenopathy c/w portal duodenopathy  Colonoscopy 11/23 findings: - Focal areas of frythematous mucosa in the sigmoid colon, in the descending colon, in the transverse colon and in the ascending colon c/w portal colopathy. - Two non-bleeding colonic angiodysplastic lesions vs prominent portal colopathy. Treated with argon plasma coagulation (APC). - Focal descending colon superficial abrasions from scope loop. - Diverticulosis in the left colon. - Internal hemorrhoids vs small rectal varices Started on Diflucan x7 days by GI, continue PPI twice daily and follow-up with Dr. Carlean Purl upon discharge.  Avoid NSAIDs, antiplatelet agents, anticoagulants.    2. Recent extensive RLE DVT: Not a candidate for anticoagulation due to #1.  Now s/p IVC filter placement by IR.  Explained the risk of thrombosis around IVC filter as well and patient understands regarding limited options as of now.  He is agreeable for IVC filter placement.  3. Decompensated liver cirrhosis with refractory ascitis, portal hypertension and recurrent GI bleeding : Patient has alcoholic liver cirrhosis complicated by portal hypertension, eso varices, ascites, hepatic encephalopathy/thrombocytopenia/coagulapathy and hyponatremia. He Underwent paracentesis in recent admission but avoiding now in concern for #4.    Patient  underwent CT angio/TIPS protocol with reduced IV contrast to assess anatomy for TIPS and felt to have adequate anatomy but GI d/w IR today and patient not felt to be a good candidate at this time given high meld score, renal issues, and hyponatremia. Current plan is for patient to be set up for TIPS as an outpatient in the next couple of weeks once medical condition optimized.  He remains on IV Rocephin for SBP prophylaxis which can likely be discontinued if okay per GI.Marland KitchenNo signs of encephalopathy now.  4. AKI on CKD stage 3a: secondary to volume loss in the setting of GIB vs aldactone use vs ATN vs hepatorenal syndrome. He had nl creatinine 0.8-1.0 until June 2020. It was up at 1.3 in Aug/sept and up to 1.7 in October/early November.  He presented with creatinine 2.5 on 11/13( previous admission) but improved to 1.5 at discharge.  Currently creatinine stable around 1.5.  Nephrology consulted and evaluating patient for this as well as hyponatremia.  5. Hyponatremia: Hypervolemic likely. Asymptomatic and levels fluctuate 125-130. Reluctant to give aggressive diuresis due to #4.  Appreciate nephrology input.  6. Intermittent sinus tachycardia:This tends to occur when patient ambulates. Could be related to deconditioning and ascites/volume status.  Discontinued midodrine given tachy and rising BP.  Blood pressure on orthostatic check today showed supine blood pressure 166/75, sitting 131/45, standing 114/49 with heart rate jumped from 1 13->119->133.  He is not a good candidate for Florinef given hypoalbuminemia/liver cirrhosis and worsening third spacing.  Advised to support stockings.  7.  Hypokalemia: Replacement ordered.  Recheck labs in a.m.   DVT prophylaxis: SCDs Code Status: Full code Family / Patient Communication: Discussed with patient Disposition Plan: Home when medically cleared.  PT recommended home health with DME (walker with 5 inch wheels)-face-to-face/HH orders placed.     LOS: 4  days    Time spent: 35 minutes    [place]ford Shi, MD [facility] Pager [phone]  If 7PM-7AM, please contact night-coverage [email or web address] Password [email or web address] 06/08/2019, 7:20 PM

## 2019-06-08 NOTE — Progress Notes (Addendum)
Patient ID: Billy [name], male   DOB: 1946-01-24, 73 y.o.   MRN: [id number]    Progress Note   Subjective  Day # 4 CC: decompensated cirrhosis, recurrent GI bleeding in setting of Eliquis for DVT, refractory ascites  EGD yesterday-grade 2 esophageal varices nonbleeding, probable Candida esophagitis, portal gastropathy and portal to adenopathy.  LA grade C reflux esophagitis with focal contact bleeding  Colonoscopy-multiple patchy focal areas of moderately erythematous mucosa left transverse and ascending colon consistent with portal colopathy, 2 small AVMs versus prominent colopathy transverse colon treated with APC, multiple diverticuli, internal hemorrhoids versus small rectal varices.  IVC filter placed yesterday  CT angio abdomen-large volume ascites mild submucosal edema throughout the small bowel consistent with portal enteropathy. 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 1.4 cm non-mass-like arterial hyperenhancement segment 3 liver favored to represent vascular anomaly however early [place] difficult to exclude  Hemoglobin 8.1 stable K+ 3.1 Creat 1.5 INR 1.4  Patient says he is feeling good today, tolerating solid food, no complaints of abdominal pain.  Had several episodes of diarrhea during the night, no blood.   Objective   Vital signs in last 24 hours: Temp:  [97.6 F (36.4 C)-97.9 F (36.6 C)] 97.6 F (36.4 C) (11/24 0549) Pulse Rate:  [92-115] 109 (11/24 0400) Resp:  [13-27] 19 (11/24 0800) BP: (111-161)/(61-138) 161/138 (11/24 0800) SpO2:  [95 %-100 %] 96 % (11/24 0800) Last BM Date: 06/06/19 General:   Older white male in NAD, pleasant Heart:  Regular rate and rhythm; no murmurs Lungs: Respirations even and unlabored, lungs CTA bilaterally Abdomen: Protuberant with fairly tense ascites, nontender . Normal bowel sounds. Extremities: 1+ edema bilateral lower extremities Neurologic:  Alert  and oriented,  grossly normal neurologically, no asterixis Psych:  Cooperative. Normal mood and affect.  Intake/Output from previous day: 11/23 0701 - 11/24 0700 In: 1285.3 [P.O.:600; I.V.:500; IV Piggyback:185.3] Out: 250 [Urine:250] Intake/Output this shift: Total I/O In: 13.3 [IV Piggyback:13.3] Out: -   Lab Results: Recent Labs    06/06/19 0157  06/07/19 2239 06/08/19 0218 06/08/19 0810  WBC 10.9*  --   --   --   --   HGB 7.5*   < > 7.6* 7.6* 8.1*  HCT 24.9*   < > 25.7* 25.4* 27.1*  PLT 158  --   --   --   --    < > = values in this interval not displayed.   BMET Recent Labs    06/06/19 0157 06/08/19 0810  NA 129* 128*  K 3.9 3.1*  CL 104 100  CO2 16* 18*  GLUCOSE 106* 98  BUN 37* 31*  CREATININE 1.85* 1.56*  CALCIUM 8.1* 8.3*   LFT Recent Labs    06/08/19 0810  PROT 5.5*  ALBUMIN 2.2*  AST 33  ALT 24  ALKPHOS 90  BILITOT 0.7   PT/INR Recent Labs    06/07/19 0224  LABPROT 17.2*  INR 1.4*    Studies/Results: Ir Ivc Filter Plmt / S&i /img Guid/mod Sed  Result Date: 06/07/2019 INDICATION: 73 year old with recent diagnosis of right lower extremity DVT in September 2020. He was subsequently placed on Eliquis but presents with bleeding of esophageal/gastric varices. He has cirrhosis and portal hypertension. This represents an absolute contraindication to further anticoagulation and he is therefore a candidate for PE prophylaxis by caval interruption. He presents for placement of an IVC filter. EXAM: ULTRASOUND GUIDANCE FOR VASCULARACCESS IVC CATHETERIZATION  AND VENOGRAM IVC FILTER INSERTION Interventional Radiologist:  Criselda Peaches, MD MEDICATIONS: None. ANESTHESIA/SEDATION: Fentanyl 100 mcg IV; Versed 2 mg IV Moderate Sedation Time:  12 minutes The patient was continuously monitored during the procedure by the interventional radiology nurse under my direct supervision. FLUOROSCOPY TIME:  Fluoroscopy Time: 0 minutes 30 seconds (165 mGy).  COMPLICATIONS: None immediate. PROCEDURE: Informed written consent was obtained from the patient after a thorough discussion of the procedural risks, benefits and alternatives. All questions were addressed. Maximal Sterile Barrier Technique was utilized including caps, mask, sterile gowns, sterile gloves, sterile drape, hand hygiene and skin antiseptic. A timeout was performed prior to the initiation of the procedure. Maximal barrier sterile technique utilized including caps, mask, sterile gowns, sterile gloves, large sterile drape, hand hygiene, and Betadine prep. Under sterile condition and local anesthesia, right internal jugular venous access was performed with ultrasound. An ultrasound image was saved and sent to PACS. Over a guidewire, the IVC filter delivery sheath and inner dilator were advanced into the IVC just above the IVC bifurcation. Contrast injection was performed for an IVC venogram. Through the delivery sheath, a retrievable Denali IVC filter was deployed below the level of the renal veins and above the IVC bifurcation. Limited post deployment venacavagram was performed. The delivery sheath was removed and hemostasis was obtained with manual compression. A dressing was placed. The patient tolerated the procedure well without immediate post procedural complication. FINDINGS: The IVC is patent. No evidence of thrombus, stenosis, or occlusion. No variant venous anatomy. Successful placement of the IVC filter below the level of the renal veins. IMPRESSION: Successful ultrasound and fluoroscopically guided placement of an infrarenal retrievable IVC filter via right jugular approach. PLAN: This IVC filter is potentially retrievable. The patient will be assessed for filter retrieval by Interventional Radiology in approximately 8-12 weeks. Further recommendations regarding filter retrieval, continued surveillance or declaration of device permanence, will be made at that time. Electronically Signed   By:  Jacqulynn Cadet M.D.   On: 06/07/2019 13:10   Ct Angio Abd/pel W/ And/or W/o  Result Date: 06/07/2019 CLINICAL DATA:  73 year old male with alcoholic cirrhosis complicated by ascites and bleeding esophageal varices. Bleeding occurred in the setting of anticoagulation for recent right lower extremity DVT. An IVC filter was just placed. Evaluate anatomy for possible TIPS versus BRTO. EXAM: CTA ABDOMEN AND PELVIS WITHOUT AND WITH CONTRAST TECHNIQUE: Multidetector CT imaging of the abdomen and pelvis was performed using the standard protocol during bolus administration of intravenous contrast. Multiplanar reconstructed images and MIPs were obtained and reviewed to evaluate the vascular anatomy. CONTRAST:  85mL OMNIPAQUE IOHEXOL 350 MG/ML SOLN COMPARISON:  None. FINDINGS: VASCULAR Aorta: Heterogeneous atherosclerotic plaque throughout the abdominal aorta. No evidence of aneurysm or dissection. Celiac: Patent without evidence of aneurysm, dissection, vasculitis or significant stenosis. SMA: Patent without evidence of aneurysm, dissection, vasculitis or significant stenosis. Replaced right hepatic artery. Renals: Solitary renal arteries bilaterally. Calcified atherosclerotic plaque at the origins without significant stenosis. IMA: Patent without evidence of aneurysm, dissection, vasculitis or significant stenosis. Inflow: Calcified atherosclerotic plaque without significant stenosis or occlusion. Proximal Outflow: Calcified atherosclerotic plaque without significant stenosis or occlusion. Veins: The right and left portal veins are patent. Nonocclusive thrombus is present in the main portal vein. I patent channel persists. The splenic vein is widely patent. 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 The presence of calcification associated with the thrombus suggests that this is a chronic process. No significant gastro renal shunt or gastric varices. Review  of the MIP images confirms the above  findings. NON-VASCULAR Lower chest: Small right pleural effusion with associated right lower lobe atelectasis. The visualized heart is normal in size. Calcifications present along the coronary arteries. No pericardial effusion. Hepatobiliary: Morphologically cirrhotic liver. The liver is small and extremely nodular. Small region of non masslike arterial hyperenhancement in hepatic segment 3 measures approximately 1.4 cm. No convincing evidence of washout on the delayed images. The gallbladder is surgically absent. Pancreas: Diffuse atrophy of the pancreas. Spleen: Mild splenomegaly. Adrenals/Urinary Tract: Normal adrenal glands. No evidence of hydronephrosis, nephrolithiasis or enhancing renal mass. Unremarkable ureters and bladder. Stomach/Bowel: Mild submucosal edema throughout the small bowel consistent with portal enteropathy. Colonic diverticular disease without CT evidence of active inflammation. Normal appendix. Lymphatic: No suspicious adenopathy. Reproductive: Prostate is unremarkable. Other: Large volume ascites. Mesenteric edema. Right inguinal hernia with ascites extending through the inguinal canal and into the scrotum. Musculoskeletal: The bones are osteopenic, particularly in the pelvis and proximal femurs. No discrete lytic or blastic osseous lesion. Multilevel degenerative disc disease. Bilateral lower lumbar facet arthropathy. Degenerative changes at the disc spacer most significant at L5-S1. There is mild degenerative anterolisthesis of L4 on L5. IMPRESSION: VASCULAR 1. 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 Anatomically, patient could be considered for TIPS creation. 2. 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 3. No evidence of gastro renal shunt or gastric varices. 4. Replaced right hepatic artery. 5.  Aortic Atherosclerosis (ICD10-170.0). NON-VASCULAR 1. Small focus of  non-masslike arterial hyperenhancement in hepatic segment 3 measures up to 1.4 cm but demonstrates no definitive washout on the portal venous phase imaging. Given the non masslike appearance, this is favored to represent a small transient hepatic attenuation difference due to a vascular anomaly. However, in the setting of cirrhosis, early hepatocellular carcinoma is difficult to exclude entirely. Recommend further evaluation with MRI of the abdomen enhanced with gadolinium contrast. 2. Large ascites extending through a right inguinal hernia into the scrotum. 3. Small right pleural effusion and associated atelectasis. 4. Colonic diverticular disease without CT evidence of active inflammation. 5. Lower lumbar degenerative disc disease and facet arthropathy. Signed, Criselda Peaches, MD, [facility] Radiology Electronically Signed   By: Jacqulynn Cadet M.D.   On: 06/07/2019 15:11      Assessment / Plan:    #1 complicated 73 year old white male with decompensated alcohol induced cirrhosis, admitted with recurrent GI bleeding in setting of Eliquis..  Endoscopic evaluation yesterday with finding of grade 2 distal esophageal varices nonbleeding and significant portal gastropathy and duodenopathy, as well as significant portal colopathy.  He had APC to 2 more prominent areas which may have been AVMs versus portal colopathy.  Hemoglobin has been stable since no active bleeding  #2 right lower extremity DVT-patient had IVC filter placed yesterday Plan is to leave off Eliquis  #3 anemia secondary to acute GI blood loss-stable overnight  #4 acute kidney injury on chronic kidney disease - has been off diuretics since last admission. Discussion with Dr. Carlean Purl today his primary gastroenterologist - would like nephrology to see to advise regarding fluid management and hyponatremia Sodium improved  #5 significant ascites-patient had CT angio done  yesterday in anticipation of TIPS.  Anatomy acceptable for TIPS. Current plan is for patient to be set up for TIPS as an outpatient in the next couple of weeks.  We will ask nephrology to see today,  hopeful that patient can be discharged tomorrow.   Principal Problem:   GI bleed Active Problems:   Essential hypertension, benign   Portal hypertensive gastropathy (HCC)   Iron deficiency anemia due to chronic blood loss   Angiodysplasia of colon   Alcoholic cirrhosis of liver (HCC)   Hydrocele in adult   Right leg DVT (HCC)   Chronic renal disease, stage III   Melena   Rectal bleeding    LOS: 4 days   Amy Esterwood PA-C 06/08/2019, 8:57 AM      Attending Physician Note   I have taken an interval history, reviewed the chart and examined the patient. I agree with the Advanced Practitioner's note, impression and recommendations.   IR does not feel he is a good candidate for TIPS at this time due to elevated MELD, partially driven by hyponatremia, and renal function  Attempt to optimize status to allow TIPS in near future, consider TIPS at tertiary center AKI on CKD, Renal consult today Recurrent GI bleeding likely from portal gastrophathy, enteropathy and colopathy which would be helped by TIPS Remain off anticoagulants for now RLE DVT S/P IVC Significant ascites  Lucio Edward, MD [facility] Gastroenterology

## 2019-06-08 NOTE — Telephone Encounter (Signed)
Dr. Laurence [name] called me about Billy [name] today regarding possible TIPS.  It is his opinion that given his meld score driven by his hyponatremia mostly that he is not a very good candidate for TIPS at this time.  The patient now has an IVC filter and should be able to come off anticoagulation which should reduce his bleeding we hope and think.  I agree that we should hold off on TIPS, my original reason for referral was refractory ascites problems but the patient experienced bleeding problems subsequently and has been hospitalized a couple of times.  I think that we can continue good medical management and I would ask for a nephrology consultation to help guide our best practices tried to manage his ascites and electrolyte abnormalities and his chronic kidney issues to see if we can turn this around and then have him do a TIPS in the next couple of months.  That would depend on his sodium and kidney function and other issues obviously.

## 2019-06-09 ENCOUNTER — Other Ambulatory Visit: Payer: 59

## 2019-06-09 ENCOUNTER — Encounter (HOSPITAL_COMMUNITY): Payer: Self-pay | Admitting: Gastroenterology

## 2019-06-09 ENCOUNTER — Other Ambulatory Visit: Payer: Self-pay | Admitting: Internal Medicine

## 2019-06-09 ENCOUNTER — Telehealth: Payer: Self-pay

## 2019-06-09 DIAGNOSIS — K7031 Alcoholic cirrhosis of liver with ascites: Secondary | ICD-10-CM

## 2019-06-09 LAB — URINALYSIS, ROUTINE W REFLEX MICROSCOPIC
Bilirubin Urine: NEGATIVE
Glucose, UA: NEGATIVE mg/dL
Hgb urine dipstick: NEGATIVE
Ketones, ur: NEGATIVE mg/dL
Leukocytes,Ua: NEGATIVE
Nitrite: NEGATIVE
Protein, ur: NEGATIVE mg/dL
Specific Gravity, Urine: 1.024 (ref 1.005–1.030)
pH: 5 (ref 5.0–8.0)

## 2019-06-09 LAB — BASIC METABOLIC PANEL
Anion gap: 9 (ref 5–15)
BUN: 30 mg/dL — ABNORMAL HIGH (ref 8–23)
CO2: 15 mmol/L — ABNORMAL LOW (ref 22–32)
Calcium: 7.9 mg/dL — ABNORMAL LOW (ref 8.9–10.3)
Chloride: 104 mmol/L (ref 98–111)
Creatinine, Ser: 1.44 mg/dL — ABNORMAL HIGH (ref 0.61–1.24)
GFR calc Af Amer: 56 mL/min — ABNORMAL LOW (ref >60)
GFR calc non Af Amer: 49 mL/min — ABNORMAL LOW (ref >60)
Glucose, Bld: 116 mg/dL — ABNORMAL HIGH (ref 70–99)
Potassium: 3.6 mmol/L (ref 3.5–5.1)
Sodium: 128 mmol/L — ABNORMAL LOW (ref 135–145)

## 2019-06-09 LAB — HEMOGLOBIN AND HEMATOCRIT, BLOOD
HCT: 25.1 % — ABNORMAL LOW (ref 39.0–52.0)
HCT: 25.4 % — ABNORMAL LOW (ref 39.0–52.0)
Hemoglobin: 7.8 g/dL — ABNORMAL LOW (ref 13.0–17.0)
Hemoglobin: 7.6 g/dL — ABNORMAL LOW (ref 13.0–17.0)

## 2019-06-09 LAB — CREATININE, URINE, RANDOM: Creatinine, Urine: 136.53 mg/dL

## 2019-06-09 LAB — SODIUM, URINE, RANDOM: Sodium, Ur: <10 mmol/L

## 2019-06-09 MED ORDER — PANTOPRAZOLE SODIUM 40 MG PO TBEC: 40.0000 mg | DELAYED_RELEASE_TABLET | Freq: Two times a day (BID) | ORAL | 0 refills | Status: AC

## 2019-06-09 MED ORDER — LACTULOSE 10 GM/15ML PO SOLN: 20.0000 g | Freq: Every day | ORAL | 0 refills | Status: DC

## 2019-06-09 MED ORDER — SILVER SULFADIAZINE 1 % EX CREA: TOPICAL_CREAM | Freq: Two times a day (BID) | CUTANEOUS | 0 refills | Status: AC

## 2019-06-09 MED ORDER — FLUCONAZOLE 100 MG PO TABS: 100.0000 mg | ORAL_TABLET | Freq: Every day | ORAL | 0 refills | Status: AC

## 2019-06-09 NOTE — Discharge Instructions (Signed)
Please come for labs on Monday, November 30, anytime in the a.m., at Dr. Bridgett Larsson [address]. [place]. Dr. Carlean Purl will call you with lab results, and we will work on getting you back to interventional radiology for TI PS.

## 2019-06-09 NOTE — Progress Notes (Signed)
Seward Carol to be D/C'd Home per MD order.  Discussed prescriptions and follow up appointments with the patient. Prescriptions given to patient, medication list explained in detail. Pt verbalized understanding.  Allergies as of 06/09/2019      Reactions   Yellow Jacket Venom Anaphylaxis, Shortness Of Breath, Other (See Comments)   Throat closes up/eye swell shut   Nsaids Other (See Comments)   Stomach pain, Bleeding risk      Medication List    STOP taking these medications   apixaban 5 MG Tabs tablet Commonly known as: Eliquis   midodrine 2.5 MG tablet Commonly known as: PROAMATINE   psyllium 58.6 % powder Commonly known as: METAMUCIL   spironolactone 50 MG tablet Commonly known as: ALDACTONE     TAKE these medications   buPROPion 150 MG 12 hr tablet Commonly known as: WELLBUTRIN SR TAKE 1 TABLET BY MOUTH EVERY DAY   ferrous sulfate 325 (65 FE) MG EC tablet Take 325 mg by mouth every other day.   fluconazole 100 MG tablet Commonly known as: DIFLUCAN Take 1 tablet (100 mg total) by mouth daily for 3 days. Start taking on: June 10, 2019   fluocinonide cream 0.05 % Commonly known as: LIDEX Apply 1 application topically 2 (two) times daily as needed (for skin rash/irritation). APPLY ON THE SKIN TWICE A DAY AS NEEDED FOR RASH   HYDROcodone-acetaminophen 5-325 MG tablet Commonly known as: NORCO/VICODIN Take 1 tablet by mouth 3 (three) times daily as needed (for pain.).   lactulose 10 GM/15ML solution Commonly known as: CHRONULAC Take 30 mLs (20 g total) by mouth daily. Please keep 3-4 bowel movement daily, please call your GI doctor if your bm less than three times a day or more than 4 times a day. Your GI doctor will help you adjust lactulose dose. What changed:   how much to take  when to take this  additional instructions   mirtazapine 45 MG tablet Commonly known as: REMERON TAKE 1 TABLET (45 MG TOTAL) BY MOUTH AT BEDTIME.   multivitamin with  minerals Tabs tablet Take 1 tablet by mouth daily.   pantoprazole 40 MG tablet Commonly known as: PROTONIX Take 1 tablet (40 mg total) by mouth 2 (two) times daily before a meal. What changed: See the new instructions.   silver sulfADIAZINE 1 % cream Commonly known as: SILVADENE Apply topically 2 (two) times daily. To left thigh   sucralfate 1 GM/10ML suspension Commonly known as: CARAFATE Take 10 mLs (1 g total) by mouth 3 (three) times daily between meals for 7 days.   vitamin C 250 MG tablet Commonly known as: ASCORBIC ACID Take 250 mg by mouth daily.            Durable Medical Equipment  (From admission, onward)         Start     Ordered   06/09/19 1252  For home use only DME Walker rolling  Once    Question:  Patient needs a walker to treat with the following condition  Answer:  FTT (failure to thrive) in adult   06/09/19 1251          Vitals:   06/09/19 0000 06/09/19 0222  BP: (!) 128/57 (!) 149/69  Pulse: 97 (!) 106  Resp: 14 17  Temp:  98.6 F (37 C)  SpO2: 96% 100%    Skin clean, dry and intact without evidence of skin break down, no evidence of skin tears noted. IV catheter discontinued intact.  Site without signs and symptoms of complications. Dressing and pressure applied. Pt denies pain at this time. No complaints noted.  An After Visit Summary was printed and given to the patient. Patient escorted via [place], and D/C home via private auto.  Nonie Hoyer S 06/09/2019 2:20 PM

## 2019-06-09 NOTE — Plan of Care (Signed)

## 2019-06-09 NOTE — Progress Notes (Signed)
Patient was given a room on the 5th floor.  Report called to [place].  Patient was informed of room transfer.  Patient expressed a lot of frustration over being transferred in the middle of the night.  Explained to the patient that transfers happen based on room availability and can happen at any time of the day or night.  Patient continues to express frustration.  Charge nurse updated.  Patient did consent to transfer to 5th floor.

## 2019-06-09 NOTE — Progress Notes (Signed)
[facility] Progress Note  Subjective: no new c/o's wants to go home  Vitals:   06/08/19 2000 06/08/19 2135 06/09/19 0000 06/09/19 0222  BP:  (!) 162/73 (!) 128/57 (!) 149/69  Pulse: (!) 101 (!) 104 97 (!) 106  Resp: 20 19 14 17   Temp: 98.4 F (36.9 C)   98.6 F (37 C)  TempSrc: Oral   Oral  SpO2: 100% 100% 96% 100%  Weight:      Height:        Inpatient medications: . buPROPion  150 mg Oral Daily  . fluconazole  100 mg Oral Daily  . guaiFENesin  600 mg Oral BID  . lactulose  20 g Oral Daily  . mirtazapine  45 mg Oral QHS  . pantoprazole  40 mg Oral BID  . silver sulfADIAZINE   Topical BID  . sucralfate  1 g Oral Q6H   . sodium chloride Stopped (06/04/19 2203)   sodium chloride, acetaminophen **OR** acetaminophen, albuterol, fluocinonide cream, HYDROcodone-acetaminophen, ondansetron **OR** ondansetron (ZOFRAN) IV    Exam: Gen alert, n o distress, cachectic a bit No rash, cyanosis or gangrene Sclera anicteric, throat clear  No jvd or bruits Chest clear bilat to bases RRR no MRG Abd soft ntnd no mass , mild ascites, nontender GU normal male  MS no joint effusions or deformity Ext 2+ bilat LE edema, no wounds or ulcers Neuro is alert, Ox 3 , nf, no flap    Date                          Creat                           eGFR  2007- 2018                 0.6- 0.95  2019                           0.96- 1.23 December 2018                  1.08  July                             1.45  Aug                             1.30                               Sept 2020                   1.77 > 1.25                  38 > 58 ml/min  Oct                              1.77  May 21, 2019                1.79                               Nov 13- 16, 2020  2.51 >> 1.50                25 >> 43 ml/min            Nov 20- 24, 2020       1.89 >> 1.56                35 >> 44 ml/min    Home meds:  - midodrine 2.5 tid  - apixaban 5 bid  - lactulose 30gm bid/ sucralfate 1  gm tid x 7d/ pantoprazole 40 qd  - mirtazapine 45 hs/ hydrocodone prn/ bupropion 150 qd  - spironolactone 200 qd  - prn's/ vitamins/ supplements      UA negative, 1.024    UNa < 10, UCr 136     Abd [place] 06/08/2019 > 9- 11 cm kidneys w/o hydro, normal echotexture      Assessment/ Plan: 1. Hyponatremia - recommend TED hose, K+ correction and avoid hypotension. He is not taking any BP lowering meds.  Fluid restriction does not help hypoNa+ in cirrhosis pt's but would keep it anyways. Salt tabs, vaptans and urea not recommended in advanced cirrhosis.  3% saline only for serious cases < 120- 125.   2. Renal failure - pt has CKD stage III w/ baseline creat 1.5, eGFR 40- 45 ml/min at baseline. Renal function has deteriorated modestly over the last few yrs but more significantly over the last few months. 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  Type 2 would not be expected to respond to typical HRS type 1 therapies (midodrine, octreotide , IV alb).  Only recommendation is supportive care.  Prognosis guarded. Have d/w patient. Have d/w primary team, will sign off. Please call w/ any questions.  3. Volume overload - mild ascites, clear lungs but + leg edema. Cautious diuresis could be considered w/ po lasix and aldactone as per usual regimen for cirrhotics.  4. Cirrhosis /etoh related - not Tx candidate at this time 5. Hx GI bleeds        Billy [name] 06/09/2019, 1:19 PM  Iron/TIBC/Ferritin/ %Sat    Component Value Date/Time   IRON 77 03/30/2019 1602   TIBC 329 03/30/2019 1602   FERRITIN 22.4 05/21/2019 1512   IRONPCTSAT 23 03/30/2019 1602   Recent Labs  Lab 06/06/19 0157 06/07/19 0224 06/08/19 0810 06/09/19 0219  NA 129*  --  128* 128*  K 3.9  --  3.1* 3.6  CL 104  --  100 104  CO2 16*  --  18* 15*  GLUCOSE 106*  --  98 116*  BUN 37*  --  31* 30*  CREATININE 1.85*  --  1.56* 1.44*  CALCIUM 8.1*  --  8.3* 7.9*  PHOS 3.6  --   --   --   ALBUMIN 2.0*  --   2.2*  --   INR  --  1.4*  --   --    Recent Labs  Lab 06/08/19 0810  AST 33  ALT 24  ALKPHOS 90  BILITOT 0.7  PROT 5.5*   Recent Labs  Lab 06/06/19 0157  06/09/19 0805  WBC 10.9*  --   --   HGB 7.5*   < > 7.6*  HCT 24.9*   < > 25.4*  PLT 158  --   --    < > = values in this interval not displayed.

## 2019-06-09 NOTE — Progress Notes (Signed)
Physical Therapy Treatment Patient Details Name: Billy [name] MRN: [id number] DOB: [birth year] Today's Date: 06/09/2019    History of Present Illness 73 y.o. male with past medical history of alcohol abuse, portal hypertension with varices, ascites, angiodysplasia of the colon status post APC treatment in the past, history of diverticulosis of colon, GERD and previous GI bleed,  depression, recent diagnosis of right lower extremity DVT on anticoagulation presented to hospital this time with complaints of rectal bleed. Pt s/p EGD, colonscopy, and IVC Filter placed today.  Bedrest for 2 hr post IVC that ended at 12:01 (RN reports order at 1406 as that was when she acknowledged - pt's 2 hrs are complete).    PT Comments    Patient is progressing well with therapy and was able to ambulate greater distance. He continues to be unsteady with lateral sway drifting Lt/Rt during gait and reaching for external support intermittently. He will benefit from use of RW to steady during gait for long distances. He was instructed in HEP for LE strengthening and provided handout. He will continue to benefit from skilled PT in HHPT or OPPT to progress functional balance and strength. Acute PT will follow and progress as able.    Follow Up Recommendations  Home health PT     Equipment Recommendations  Rolling walker with 5" wheels    Recommendations for Other Services       Precautions / Restrictions Precautions Precautions: Fall Precaution Comments: pt reports 1 fall in past year Restrictions Weight Bearing Restrictions: No    Mobility  Bed Mobility               General bed mobility comments: pt OOB at start of session moving around in room with RN  Transfers Overall transfer level: Needs assistance Equipment used: None Transfers: Sit to/from Stand Sit to Stand: Supervision Stand pivot transfers: Supervision       General transfer comment: pt using UE to initiate power up and  steady with rising, no assist required. HR ranged from 109-132 with sit to stand transfers.  Ambulation/Gait   Gait Distance (Feet): 240 Feet Assistive device: None Gait Pattern/deviations: Wide base of support;Drifts right/left Gait velocity: decreased   General Gait Details: pt with excessive lateral sway and drifts Rt/Lt while ambulating, knees flexed; reports slightly unsteady compared to normal; HR ranged from 118-149 with gait. pt reaching for furniture and wall in hospital room to steady himself   Stairs             Wheelchair Mobility    Modified Rankin (Stroke Patients Only)       Balance Overall balance assessment: Needs assistance Sitting-balance support: Feet supported;No upper extremity supported Sitting balance-Leahy Scale: Normal     Standing balance support: No upper extremity supported;During functional activity Standing balance-Leahy Scale: Good             Cognition Arousal/Alertness: Awake/alert Behavior During Therapy: WFL for tasks assessed/performed Overall Cognitive Status: Within Functional Limits for tasks assessed           Exercises General Exercises - Lower Extremity Long Arc Quad: Strengthening;Both;5 reps;Seated Hip ABduction/ADduction: AROM;Strengthening;Standing;5 reps Heel Raises: AROM;10 reps;Standing;Both Other Exercises Other Exercises: Standing Hip extension: 5 reps bil LE with counter support Other Exercises: 1x 5 reps Sit<>stand with/without UE use for power up    General Comments        Pertinent Vitals/Pain Pain Assessment: No/denies pain           PT Goals (current goals  can now be found in the care plan section) Acute Rehab PT Goals Patient Stated Goal: to go home PT Goal Formulation: With patient Time For Goal Achievement: 06/19/19 Potential to Achieve Goals: Good    Frequency    Min 3X/week      PT Plan Current plan remains appropriate       AM-PAC PT "6 Clicks" Mobility   Outcome  Measure  Help needed turning from your back to your side while in a flat bed without using bedrails?: None Help needed moving from lying on your back to sitting on the side of a flat bed without using bedrails?: None Help needed moving to and from a bed to a chair (including a wheelchair)?: None Help needed standing up from a chair using your arms (e.g., wheelchair or bedside chair)?: None Help needed to walk in hospital room?: None Help needed climbing 3-5 steps with a railing? : A Little 6 Click Score: 23    End of Session Equipment Utilized During Treatment: Gait belt Activity Tolerance: Patient tolerated treatment well Patient left: with call bell/phone within reach;in chair Nurse Communication: Mobility status PT Visit Diagnosis: Difficulty in walking, not elsewhere classified (R26.2);History of falling (Z91.81)     Time: [id number] PT Time Calculation (min) (ACUTE ONLY): 25 min  Charges:  $Gait Training: 8-22 mins $Therapeutic Exercise: 8-22 mins                     Kipp Brood, PT, DPT Physical Therapist with [facility]  06/09/2019 2:48 PM

## 2019-06-09 NOTE — Discharge Summary (Signed)
Discharge Summary  Billy [name] [id number] DOB: September 04, 1945  PCP: [name], MD  Admit date: 06/04/2019 Discharge date: 06/09/2019  Time spent: 58mins, more than 50% time spent on coordination of care.  Recommendations for Outpatient Follow-up:  1. F/u  with GI Dr Carlean Purl on Monday 11/30 2. F/u PCP within a week  for hospital discharge follow up 3. Need to close monitor volume status, cbc/renal and hepatic function    Discharge Diagnoses:  Active Hospital Problems   Diagnosis Date Noted   GI bleed 05/29/2019   Melena    Rectal bleeding    Chronic renal disease, stage III 04/02/2019   Right leg DVT (HCC)    Hydrocele in adult [id number]   Alcoholic cirrhosis of liver ([place]) 09/24/2011   Angiodysplasia of colon 09/11/2011   Iron deficiency anemia due to chronic blood loss 09/05/2011   Essential hypertension, benign 08/06/2006   Portal hypertensive gastropathy ([place]) 08/06/2006    Resolved Hospital Problems  No resolved problems to display.    Discharge Condition: stable  Diet recommendation: low sodium diet  Filed Weights   06/05/19 0500 06/06/19 0448 06/07/19 0327  Weight: 78.1 kg 80.5 kg 78.3 kg    History of present illness: (per admitting MD Dr Louanne Belton on 06/04/2019) Billy [name] is a 73 y.o. male with past medical history of alcohol abuse, portal hypertension with varices, ascites, angiodysplasia of the colon status post APC treatment in the past, history of diverticulosis of colon, GERD and previous GI bleed,  depression, recent diagnosis of right lower extremity DVT on anticoagulation presented to hospital this time with complaints of rectal bleed.  Patient stated that he did have bright red bleeding this morning and felt slightly dizzy.  Patient denied any chest pain, palpitation, dyspnea or syncope.  Denied any abdominal pain, nausea or vomiting or hematemesis.  Patient stated that he was on lactulose and was having frequent bowel  movements but had to take Imodium because of that and had not had a bowel movement yesterday.  Patient denies urinary urgency, frequency or dysuria.  Denies fever, chills or rigors.  Denied shortness of breath.  He however complains of mild abdominal distention without pain.  Patient does have a chronic scrotal swelling from hydrocele.   Hospital Course:  Principal Problem:   GI bleed Active Problems:   Essential hypertension, benign   Portal hypertensive gastropathy (HCC)   Iron deficiency anemia due to chronic blood loss   Angiodysplasia of colon   Alcoholic cirrhosis of liver (HCC)   Hydrocele in adult   Right leg DVT (HCC)   Chronic renal disease, stage III   Melena   Rectal bleeding   Recurrent upper and lower GI bleed in the setting of thrombocytopenia coagulopathy with history of alcohol liver cirrhosis and esophagitis -Received 1 units of PRBC on June 04, 2019 -GI consulted, he is status post EGD and colonoscopy during this hospitalization EGD 11/23 findings : LA Grade C reflux esophagitis with mild focal contact bleeding. - Grade II esophageal varices. - Esophageal plaques were found, consistent with candidiasis. - Benign-appearing esophageal stenosis. - Portal hypertensive gastropathy. - Erythematous duodenopathy c/w portal duodenopathy  Colonoscopy 11/23 findings: - Focal areas of frythematous mucosa in the sigmoid colon, in the descending colon, in the transverse colon and in the ascending colon c/w portal colopathy. - Two non-bleeding colonic angiodysplastic lesions vs prominent portal colopathy. Treated with argon plasma coagulation (APC). - Focal descending colon superficial abrasions from scope loop. - Diverticulosis in the  left colon. - Internal hemorrhoids vs small rectal varices Started on Diflucan x7 days by GI, continue PPI twice daily and follow-up with Dr. Carlean Purl upon discharge.  Avoid NSAIDs, antiplatelet agents, anticoagulants.   -He started on  Protonix twice a day and Carafate, he is also started on Diflucan for total of 7 days treatment -He is doing better, denies abdominal pain, no active bleeding, hemoglobin has been stable for last few days, he desires to go home, he is cleared by GI and nephrology to go home -He is going home on PPI and Carafate, 3 more days of Diflucan to finish 7 days treatment.  Eliquis was discontinued, midodrine discontinued due to hypertension and tachycardia, per Dr. Haig Prophet to nephrology and the midodrine will not help in type II hepatorenal syndrome either.  Spironolactone discontinued due to renal failure.  He will repeat blood work at GI office, further medication adjustment per GI at hospital discharge follow-up. -GI will decide TIPS procedure at hospital discharge follow-up  Decompensated alcohol liver cirrhosis with refractory ascites portal hypertension , hyponatremia , coagulopathy and recurrent GI bleeding -He has no confusion no fever no stomach pain in the hospital, he was on IV Rocephin for prophylaxis. -He will follow up with GI on Monday to decide on TIPS procedure -He is on lactulose to keep to have 3-4 bowel movement a day  AKI on CKD stage III -Nephrology consulted, renal ultrasound showed increased echogenicity within the kidneys consistent with medical renal disease.  Per nephrology Dr. 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  Midodrine will not help this situation. -Creatinine on presentation was 1.89, spironolactone on hold.  Creatinine improved back to his baseline around 1.4 to 1.5 at discharge -GI will repeat lab work at hospital discharge follow-up on Monday, further medication adjustment per GI at hospital discharge follow-up.  Intermittent sinus tachycardia, he denies of chest pain no dizziness.  Midodrine discontinued. Home health physical therapy ordered I will have close hospital discharge follow-up  Hypokalemia replaced and normalized.  History of recent  extensive right lower extremity DVT, Eliquis stopped due to GI bleeding and coagulopathy.  He is stat post IVC filter placement by IR on June 07, 2019 .  Procedures:  PRBC transfusion  EGD and colonoscopy  IVC filter placement  Consultations:  GI  Nephrology  IR  Discharge Exam: BP (!) 149/69    Pulse (!) 106    Temp 98.6 F (37 C) (Oral)    Resp 17    Ht 6\' 3"  (1.905 m)    Wt 78.3 kg    SpO2 100%    BMI 21.58 kg/m   General: NAD Cardiovascular: Regular rate rhythm Respiratory: Clear to auscultation bilaterally Abdomen, mild distention, nontender, positive bowel sounds Extremity; bilateral lower extremity edema has improved.  Discharge Instructions You were cared for by a hospitalist during your hospital stay. If you have any questions about your discharge medications or the care you received while you were in the hospital after you are discharged, you can call the unit and asked to speak with the hospitalist on call if the hospitalist that took care of you is not available. Once you are discharged, your primary care physician will handle any further medical issues. Please note that NO REFILLS for any discharge medications will be authorized once you are discharged, as it is imperative that you return to your primary care physician (or establish a relationship with a primary care physician if you do not have one) for your  aftercare needs so that they can reassess your need for medications and monitor your lab values.  Discharge Instructions    Diet - low sodium heart healthy   Complete by: As directed    Discharge instructions   Complete by: As directed    Please call your gi doctor if you have abdominal pain, fever, confusion or gi bleed.   Increase activity slowly   Complete by: As directed      Allergies as of 06/09/2019      Reactions   Yellow Jacket Venom Anaphylaxis, Shortness Of Breath, Other (See Comments)   Throat closes up/eye swell shut   Nsaids Other  (See Comments)   Stomach pain, Bleeding risk      Medication List    STOP taking these medications   apixaban 5 MG Tabs tablet Commonly known as: Eliquis   midodrine 2.5 MG tablet Commonly known as: PROAMATINE   psyllium 58.6 % powder Commonly known as: METAMUCIL   spironolactone 50 MG tablet Commonly known as: ALDACTONE     TAKE these medications   buPROPion 150 MG 12 hr tablet Commonly known as: WELLBUTRIN SR TAKE 1 TABLET BY MOUTH EVERY DAY   ferrous sulfate 325 (65 FE) MG EC tablet Take 325 mg by mouth every other day.   fluconazole 100 MG tablet Commonly known as: DIFLUCAN Take 1 tablet (100 mg total) by mouth daily for 3 days. Start taking on: June 10, 2019   fluocinonide cream 0.05 % Commonly known as: LIDEX Apply 1 application topically 2 (two) times daily as needed (for skin rash/irritation). APPLY ON THE SKIN TWICE A DAY AS NEEDED FOR RASH   HYDROcodone-acetaminophen 5-325 MG tablet Commonly known as: NORCO/VICODIN Take 1 tablet by mouth 3 (three) times daily as needed (for pain.).   lactulose 10 GM/15ML solution Commonly known as: CHRONULAC Take 30 mLs (20 g total) by mouth daily. Please keep 3-4 bowel movement daily, please call your GI doctor if your bm less than three times a day or more than 4 times a day. Your GI doctor will help you adjust lactulose dose. What changed:   how much to take  when to take this  additional instructions   mirtazapine 45 MG tablet Commonly known as: REMERON TAKE 1 TABLET (45 MG TOTAL) BY MOUTH AT BEDTIME.   multivitamin with minerals Tabs tablet Take 1 tablet by mouth daily.   pantoprazole 40 MG tablet Commonly known as: PROTONIX Take 1 tablet (40 mg total) by mouth 2 (two) times daily before a meal. What changed: See the new instructions.   silver sulfADIAZINE 1 % cream Commonly known as: SILVADENE Apply topically 2 (two) times daily. To left thigh   sucralfate 1 GM/10ML suspension Commonly known as:  CARAFATE Take 10 mLs (1 g total) by mouth 3 (three) times daily between meals for 7 days.   vitamin C 250 MG tablet Commonly known as: ASCORBIC ACID Take 250 mg by mouth daily.      Allergies  Allergen Reactions   Yellow Jacket Venom Anaphylaxis, Shortness Of Breath and Other (See Comments)    Throat closes up/eye swell shut   Nsaids Other (See Comments)    Stomach pain, Bleeding risk   Follow-up Information    IR clinic [phone]) to further evaluate for possible TIPS. Follow up.        Gatha Mayer, MD Follow up on 06/14/2019.   Specialty: Gastroenterology Contact information: [address] [place] [address] [phone]  [name], MD Follow up in 2 week(s).   Specialties: Internal Medicine, Pediatrics Why: hospital discharge follow up Contact information: [place] [address] [phone]            The results of significant diagnostics from this hospitalization (including imaging, microbiology, ancillary and laboratory) are listed below for reference.    Significant Diagnostic Studies: Ct Head Wo Contrast  Result Date: 05/28/2019 CLINICAL DATA:  Altered mental status 1 month. Incontinent since yesterday. EXAM: CT HEAD WITHOUT CONTRAST TECHNIQUE: Contiguous axial images were obtained from the base of the skull through the vertex without intravenous contrast. COMPARISON:  None. FINDINGS: Brain: Ventricles, cisterns and other CSF spaces are normal. There is minimal chronic ischemic microvascular disease. There is no mass, mass effect, shift of midline structures or acute hemorrhage. No evidence of acute infarction. Vascular: No hyperdense vessel or unexpected calcification. Skull: Normal. Negative for fracture or focal lesion. Sinuses/Orbits: No acute finding. Other: None. IMPRESSION: 1.  No acute findings. 2.  Minimal chronic ischemic microvascular disease. Electronically Signed   By: Marin Olp M.D.   On:  05/28/2019 14:51   US Abdomen Complete  Result Date: 05/29/2019 CLINICAL DATA:  Ascites due to alcoholic cirrhosis. EXAM: ABDOMEN ULTRASOUND COMPLETE COMPARISON:  01/14/2019 FINDINGS: Gallbladder: Status post cholecystectomy. Common bile duct: Diameter: 3.4 mm Liver: Cirrhotic liver appears shrunken with a nodular contour and heterogeneous echotexture. No focal liver abnormality. Portal vein is patent on color Doppler imaging with normal direction of blood flow towards the liver. IVC: No abnormality visualized. Pancreas: Visualized portion unremarkable. Spleen: Size and appearance within normal limits. Right Kidney: Length: 10.3 cm. Echogenicity within normal limits. No mass or hydronephrosis visualized. Left Kidney: Length: 9.6 cm. Echogenicity within normal limits. No mass or hydronephrosis visualized. Abdominal aorta: No aneurysm visualized. Other findings: Moderate volume of ascites identified within all 4 quadrants of the abdomen. IMPRESSION: 1. Morphologic features of liver compatible with cirrhosis. 2. Moderate volume of ascites is identified in all 4 quadrants of the abdomen. Electronically Signed   By: Kerby Moors M.D.   On: 05/29/2019 11:17   Ir Ivc Filter Plmt / S&i /img Guid/mod Sed  Result Date: 06/07/2019 INDICATION: 73 year old with recent diagnosis of right lower extremity DVT in September 2020. He was subsequently placed on Eliquis but presents with bleeding of esophageal/gastric varices. He has cirrhosis and portal hypertension. This represents an absolute contraindication to further anticoagulation and he is therefore a candidate for PE prophylaxis by caval interruption. He presents for placement of an IVC filter. EXAM: ULTRASOUND GUIDANCE FOR VASCULARACCESS IVC CATHETERIZATION AND VENOGRAM IVC FILTER INSERTION Interventional Radiologist:  Criselda Peaches, MD MEDICATIONS: None. ANESTHESIA/SEDATION: Fentanyl 100 mcg IV; Versed 2 mg IV Moderate Sedation Time:  12 minutes The patient  was continuously monitored during the procedure by the interventional radiology nurse under my direct supervision. FLUOROSCOPY TIME:  Fluoroscopy Time: 0 minutes 30 seconds (165 mGy). COMPLICATIONS: None immediate. PROCEDURE: Informed written consent was obtained from the patient after a thorough discussion of the procedural risks, benefits and alternatives. All questions were addressed. Maximal Sterile Barrier Technique was utilized including caps, mask, sterile gowns, sterile gloves, sterile drape, hand hygiene and skin antiseptic. A timeout was performed prior to the initiation of the procedure. Maximal barrier sterile technique utilized including caps, mask, sterile gowns, sterile gloves, large sterile drape, hand hygiene, and Betadine prep. Under sterile condition and local anesthesia, right internal jugular venous access was performed with ultrasound. An ultrasound image was  saved and sent to PACS. Over a guidewire, the IVC filter delivery sheath and inner dilator were advanced into the IVC just above the IVC bifurcation. Contrast injection was performed for an IVC venogram. Through the delivery sheath, a retrievable Denali IVC filter was deployed below the level of the renal veins and above the IVC bifurcation. Limited post deployment venacavagram was performed. The delivery sheath was removed and hemostasis was obtained with manual compression. A dressing was placed. The patient tolerated the procedure well without immediate post procedural complication. FINDINGS: The IVC is patent. No evidence of thrombus, stenosis, or occlusion. No variant venous anatomy. Successful placement of the IVC filter below the level of the renal veins. IMPRESSION: Successful ultrasound and fluoroscopically guided placement of an infrarenal retrievable IVC filter via right jugular approach. PLAN: This IVC filter is potentially retrievable. The patient will be assessed for filter retrieval by Interventional Radiology in  approximately 8-12 weeks. Further recommendations regarding filter retrieval, continued surveillance or declaration of device permanence, will be made at that time. Electronically Signed   By: Jacqulynn Cadet M.D.   On: 06/07/2019 13:10   US Renal  Result Date: 06/08/2019 CLINICAL DATA:  Renal failure and hypertension EXAM: RENAL / URINARY TRACT ULTRASOUND COMPLETE COMPARISON:  CTA from the previous day. FINDINGS: Right Kidney: Renal measurements: 10.9 x 4.0 x 5.2 cm. = volume: 117 mL. Mild increased echogenicity is noted. Left Kidney: Renal measurements: 10.8 x 5.3 x 4.8 cm. = volume: 144 mL. Mild increased echogenicity is noted. Bladder: Appears normal for degree of bladder distention. Other: Considerable ascites is noted consistent with cirrhosis IMPRESSION: Increased echogenicity within the kidneys consistent with medical renal disease. Ascites is noted similar to that seen on prior CT consistent with underlying cirrhosis. Electronically Signed   By: Inez Catalina M.D.   On: 06/08/2019 19:40   US Paracentesis  Result Date: 05/30/2019 INDICATION: Patient with history alcoholic cirrhosis, esophageal varices s/p banding, abdominal distension, and ascites. Request is made for diagnostic and therapeutic paracentesis. EXAM: ULTRASOUND GUIDED DIAGNOSTIC AND THERAPEUTIC PARACENTESIS MEDICATIONS: 15 mL 1% lidocaine COMPLICATIONS: None immediate. PROCEDURE: Informed written consent was obtained from the patient after a discussion of the risks, benefits and alternatives to treatment. A timeout was performed prior to the initiation of the procedure. Initial ultrasound scanning demonstrates a moderate amount of ascites within the left lower abdominal quadrant. The left lower abdomen was prepped and draped in the usual sterile fashion. 1% lidocaine was used for local anesthesia. Following this, a 19 gauge, 7-cm, Yueh catheter was introduced. An ultrasound image was saved for documentation purposes. The paracentesis  was performed. The catheter was removed and a dressing was applied. The patient tolerated the procedure well without immediate post procedural complication. Patient received post-procedure intravenous albumin; see nursing notes for details. FINDINGS: A total of approximately 3.2 L of clear yellow fluid was removed. Samples were sent to the laboratory as requested by the clinical team. IMPRESSION: Successful ultrasound-guided paracentesis yielding 3.2 L of peritoneal fluid. Read by: Earley Abide, PA-C Electronically Signed   By: Sandi Mariscal M.D.   On: 05/30/2019 12:13   Dg Chest Port 1 View  Result Date: 05/28/2019 CLINICAL DATA:  Altered mental status, weakness. EXAM: PORTABLE CHEST 1 VIEW COMPARISON:  07/14/2018. FINDINGS: Trachea is midline. Heart size normal. Lungs are clear. Left costophrenic angle was not included on the image. No large pleural effusion. IMPRESSION: No acute findings. Electronically Signed   By: Lorin Picket M.D.   On: 05/28/2019 13:57  Vas [place] Lower Extremity Venous (dvt)  Result Date: 06/01/2019  Lower Venous Study Indications: History of DVT.  Anticoagulation: Eliquis. Comparison Study: 03/30/2019 - Right EIV, CFV, FV, PopV, PTV, PeroV Performing Technologist: Oliver Hum RVT  Examination Guidelines: A complete evaluation includes B-mode imaging, spectral Doppler, color Doppler, and power Doppler as needed of all accessible portions of each vessel. Bilateral testing is considered an integral part of a complete examination. Limited examinations for reoccurring indications may be performed as noted.  +---------+---------------+---------+-----------+----------+-----------------+  RIGHT     Compressibility Phasicity Spontaneity Properties Thrombus Aging     +---------+---------------+---------+-----------+----------+-----------------+  CFV       Partial         Yes       Yes                    Age Indeterminate   +---------+---------------+---------+-----------+----------+-----------------+  SFJ       Full                                                                +---------+---------------+---------+-----------+----------+-----------------+  FV Prox   Partial         No        No                     Age Indeterminate  +---------+---------------+---------+-----------+----------+-----------------+  FV Mid    Partial         No        No                     Age Indeterminate  +---------+---------------+---------+-----------+----------+-----------------+  FV Distal Partial         Yes       Yes                    Age Indeterminate  +---------+---------------+---------+-----------+----------+-----------------+  PFV       Full                                                                +---------+---------------+---------+-----------+----------+-----------------+  POP       Partial         No        No                     Age Indeterminate  +---------+---------------+---------+-----------+----------+-----------------+  PTV       Full                                                                +---------+---------------+---------+-----------+----------+-----------------+  PERO      Full                                                                +---------+---------------+---------+-----------+----------+-----------------+   +----+---------------+---------+-----------+----------+--------------+  LEFT Compressibility Phasicity Spontaneity Properties Thrombus Aging  +----+---------------+---------+-----------+----------+--------------+  CFV  Full            Yes       Yes                                    +----+---------------+---------+-----------+----------+--------------+     Summary: Right: Findings consistent with age indeterminate deep vein thrombosis involving the right common femoral vein, right femoral vein, and right popliteal vein. No cystic structure found in the popliteal fossa. Left: No evidence of common  femoral vein obstruction.  *See table(s) above for measurements and observations. Electronically signed by Deitra Mayo MD on 06/01/2019 at 1:09:33 PM.    Final    Ir Radiologist Eval & Mgmt  Result Date: 05/26/2019 Please refer to notes tab for details about interventional procedure. (Op Note)  Ct Angio Abd/pel W/ And/or W/o  Result Date: 06/07/2019 CLINICAL DATA:  73 year old male with alcoholic cirrhosis complicated by ascites and bleeding esophageal varices. Bleeding occurred in the setting of anticoagulation for recent right lower extremity DVT. An IVC filter was just placed. Evaluate anatomy for possible TIPS versus BRTO. EXAM: CTA ABDOMEN AND PELVIS WITHOUT AND WITH CONTRAST TECHNIQUE: Multidetector CT imaging of the abdomen and pelvis was performed using the standard protocol during bolus administration of intravenous contrast. Multiplanar reconstructed images and MIPs were obtained and reviewed to evaluate the vascular anatomy. CONTRAST:  45mL OMNIPAQUE IOHEXOL 350 MG/ML SOLN COMPARISON:  None. FINDINGS: VASCULAR Aorta: Heterogeneous atherosclerotic plaque throughout the abdominal aorta. No evidence of aneurysm or dissection. Celiac: Patent without evidence of aneurysm, dissection, vasculitis or significant stenosis. SMA: Patent without evidence of aneurysm, dissection, vasculitis or significant stenosis. Replaced right hepatic artery. Renals: Solitary renal arteries bilaterally. Calcified atherosclerotic plaque at the origins without significant stenosis. IMA: Patent without evidence of aneurysm, dissection, vasculitis or significant stenosis. Inflow: Calcified atherosclerotic plaque without significant stenosis or occlusion. Proximal Outflow: Calcified atherosclerotic plaque without significant stenosis or occlusion. Veins: The right and left portal veins are patent. Nonocclusive thrombus is present in the main portal vein. I patent channel persists. The splenic vein is widely patent.  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 The presence of calcification associated with the thrombus suggests that this is a chronic process. No significant gastro renal shunt or gastric varices. Review of the MIP images confirms the above findings. NON-VASCULAR Lower chest: Small right pleural effusion with associated right lower lobe atelectasis. The visualized heart is normal in size. Calcifications present along the coronary arteries. No pericardial effusion. Hepatobiliary: Morphologically cirrhotic liver. The liver is small and extremely nodular. Small region of non masslike arterial hyperenhancement in hepatic segment 3 measures approximately 1.4 cm. No convincing evidence of washout on the delayed images. The gallbladder is surgically absent. Pancreas: Diffuse atrophy of the pancreas. Spleen: Mild splenomegaly. Adrenals/Urinary Tract: Normal adrenal glands. No evidence of hydronephrosis, nephrolithiasis or enhancing renal mass. Unremarkable ureters and bladder. Stomach/Bowel: Mild submucosal edema throughout the small bowel consistent with portal enteropathy. Colonic diverticular disease without CT evidence of active inflammation. Normal appendix. Lymphatic: No suspicious adenopathy. Reproductive: Prostate is unremarkable. Other: Large volume ascites. Mesenteric edema. Right inguinal hernia with ascites extending through the inguinal canal and into the scrotum. Musculoskeletal: The bones are osteopenic, particularly in the pelvis and proximal femurs. No discrete lytic or blastic osseous lesion. Multilevel degenerative disc disease. Bilateral lower lumbar facet arthropathy. Degenerative changes at the  disc spacer most significant at L5-S1. There is mild degenerative anterolisthesis of L4 on L5. IMPRESSION: VASCULAR 1. 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 Anatomically, patient could be considered for TIPS creation. 2. 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 3. No evidence of gastro renal shunt or gastric varices. 4. Replaced right hepatic artery. 5.  Aortic Atherosclerosis (ICD10-170.0). NON-VASCULAR 1. Small focus of non-masslike arterial hyperenhancement in hepatic segment 3 measures up to 1.4 cm but demonstrates no definitive washout on the portal venous phase imaging. Given the non masslike appearance, this is favored to represent a small transient hepatic attenuation difference due to a vascular anomaly. However, in the setting of cirrhosis, early hepatocellular carcinoma is difficult to exclude entirely. Recommend further evaluation with MRI of the abdomen enhanced with gadolinium contrast. 2. Large ascites extending through a right inguinal hernia into the scrotum. 3. Small right pleural effusion and associated atelectasis. 4. Colonic diverticular disease without CT evidence of active inflammation. 5. Lower lumbar degenerative disc disease and facet arthropathy. Signed, Criselda Peaches, MD, [facility] Radiology Electronically Signed   By: Jacqulynn Cadet M.D.   On: 06/07/2019 15:11    Microbiology: Recent Results (from the past 240 hour(s))  Body fluid culture     Status: None   Collection Time: 05/30/19 11:38 AM   Specimen: PATH Cytology Peritoneal fluid  Result Value Ref Range Status   Specimen Description   Final    PERITONEAL Performed at [place] [address], [place], [place] [address]    Special Requests   Final    NONE Performed at [facility], [place] [address], [place], [place] [address]    Gram Stain NO WBC SEEN NO ORGANISMS SEEN   Final   Culture   Final    NO GROWTH 3 DAYS Performed at [facility], [place] [address], [place], [place] [address]    Report Status 06/02/2019 FINAL  Final  Culture, body fluid-bottle     Status:  None   Collection Time: 05/30/19 11:38 AM   Specimen: Peritoneal Washings  Result Value Ref Range Status   Specimen Description PERITONEAL  Final   Special Requests NONE  Final   Culture   Final    NO GROWTH 5 DAYS Performed at [facility], [address] [place], [place] [address]    Report Status 06/04/2019 FINAL  Final  Gram stain     Status: None   Collection Time: 05/30/19 11:38 AM   Specimen: Peritoneal Washings  Result Value Ref Range Status   Specimen Description PERITONEAL  Final   Special Requests NONE  Final   Gram Stain   Final    RARE WBC PRESENT, PREDOMINANTLY MONONUCLEAR NO ORGANISMS SEEN Performed at [facility], [place] [address], [place], [place] [address]    Report Status 05/31/2019 FINAL  Final  SARS CORONAVIRUS 2 (TAT 6-24 HRS) Nasopharyngeal Nasopharyngeal Swab     Status: None   Collection Time: 06/04/19  1:57 PM   Specimen: Nasopharyngeal Swab  Result Value Ref Range Status   SARS Coronavirus 2 NEGATIVE NEGATIVE Final    Comment: (NOTE) SARS-CoV-2 target nucleic acids are NOT DETECTED. The SARS-CoV-2 RNA is generally detectable in upper and lower respiratory specimens during the acute phase of infection. Negative results do not preclude SARS-CoV-2 infection, do not rule out co-infections  with other pathogens, and should not be used as the sole basis for treatment or other patient management decisions. Negative results must be combined with clinical observations, patient history, and epidemiological information. The expected result is Negative. Fact Sheet for Patients: SugarRoll.be Fact Sheet for Healthcare Providers: https://www.woods-mathews.com/ This test is not yet approved or cleared by the [place] FDA and  has been authorized for detection and/or diagnosis of SARS-CoV-2 by FDA under an Emergency Use Authorization (EUA). This EUA will remain  in effect (meaning this test can be used) for  the duration of the COVID-19 declaration under Section 56 4(b)(1) of the Act, 21 U.S.C. section 360bbb-3(b)(1), unless the authorization is terminated or revoked sooner. Performed at [facility], [place] [address] [place], [place] [address]   MRSA PCR Screening     Status: None   Collection Time: 06/04/19  6:13 PM   Specimen: Nasal Mucosa; Nasopharyngeal  Result Value Ref Range Status   MRSA by PCR NEGATIVE NEGATIVE Final    Comment:        The GeneXpert MRSA Assay (FDA approved for NASAL specimens only), is one component of a comprehensive MRSA colonization surveillance program. It is not intended to diagnose MRSA infection nor to guide or monitor treatment for MRSA infections. Performed at [facility], [place] [address], [place], [place] [address]      Labs: Basic Metabolic Panel: Recent Labs  Lab 06/04/19 1150 06/05/19 0159 06/06/19 0157 06/08/19 0810 06/09/19 0219  NA 128* 130* 129* 128* 128*  K 3.8 4.0 3.9 3.1* 3.6  CL 97* 102 104 100 104  CO2 19* 18* 16* 18* 15*  GLUCOSE 107* 127* 106* 98 116*  BUN 38* 35* 37* 31* 30*  CREATININE 1.89* 1.75* 1.85* 1.56* 1.44*  CALCIUM 8.3* 8.1* 8.1* 8.3* 7.9*  MG  --  2.0  --   --   --   PHOS  --   --  3.6  --   --    Liver Function Tests: Recent Labs  Lab 06/04/19 1150 06/05/19 0159 06/06/19 0157 06/08/19 0810  AST 36 35  --  33  ALT 22 21  --  24  ALKPHOS 85 79  --  90  BILITOT 1.0 0.4  --  0.7  PROT 6.1* 5.3*  --  5.5*  ALBUMIN 2.6* 2.1* 2.0* 2.2*   No results for input(s): LIPASE, AMYLASE in the last 168 hours. Recent Labs  Lab 06/08/19 1111  AMMONIA 37*   CBC: Recent Labs  Lab 06/03/19 1210 06/04/19 1150  06/05/19 0159  06/06/19 0157  06/08/19 0810 06/08/19 1418 06/08/19 2030 06/09/19 0219 06/09/19 0805  WBC 7.5 12.5*  --  10.0  --  10.9*  --   --   --   --   --   --   NEUTROABS 6.3 10.6*  --   --   --  9.0*  --   --   --   --   --   --   HGB 7.4 Repeated and verified  X2.* 7.6*   < > 7.5*   7.7*   < > 7.5*   < > 8.1* 7.9* 7.7* 7.8* 7.6*  HCT 22.8 Repeated and verified X2.* 25.3*   < > 24.6*   25.1*   < > 24.9*   < > 27.1* 25.5* 25.2* 25.1* 25.4*  MCV 94.3 100.0  --  96.2  --  100.0  --   --   --   --   --   --  PLT 177.0 225  --  146*  --  158  --   --   --   --   --   --    < > = values in this interval not displayed.   Cardiac Enzymes: No results for input(s): CKTOTAL, CKMB, CKMBINDEX, TROPONINI in the last 168 hours. BNP: BNP (last 3 results) No results for input(s): BNP in the last 8760 hours.  ProBNP (last 3 results) No results for input(s): PROBNP in the last 8760 hours.  CBG: No results for input(s): GLUCAP in the last 168 hours.     Signed:  Florencia Reasons MD, PhD, FACP  [facility] 06/09/2019, 10:44 AM

## 2019-06-09 NOTE — TOC Progression Note (Signed)
Transition of Care [facility]) - Progression Note    Patient Details  Name: Billy [name] MRN: [id number] Date of Birth: 13-Nov-1945  Transition of Care [facility]) CM/SW [place], [place] Phone Number: 06/09/2019, 4:01 PM  Clinical Narrative:   Met with patient this AM, let him know I had been unable to locate [facility] PT so far.  Asked if he was open to going to a clinic.  He stated he would prefer not, would rather go home and wait there until someone is able to come out.  Heard back from my final 2 outstanding calls.  Both declined due to overload of cases.  Went back to tell patient, and he had been discharged.  I messaged the Dr and asked her to order outpatient PT.  When ordered, I will follow up with patient and set him up for services in [place].    Expected Discharge Plan: [place] Services Barriers to Discharge: Other (comment)(seeking home health service)  Expected Discharge Plan and Services Expected Discharge Plan: [place] Choice: Home Health   Expected Discharge Date: 06/09/19                                     Social Determinants of Health (SDOH) Interventions    Readmission Risk Interventions Readmission Risk Prevention Plan 06/07/2019  Transportation Screening Complete  PCP or Specialist Appt within 3-5 Days Complete  Not Complete comments pt established with providers  [place] or [place] Complete  Social Work Consult for [place] Planning/Counseling Complete  Palliative Care Screening Not Complete  Palliative Care Screening Not Complete Comments pending need  Medication Review (RN Transport planner) Referral to Pharmacy  Some recent data might be hidden

## 2019-06-09 NOTE — Progress Notes (Addendum)
Patient ID: Billy [name], male   DOB: [birth year], 73 y.o.   MRN: [id number]    Progress Note   Subjective  Day # 6 CC: decompensated cirrhosis, recurrent GI bleeding in setting of anticoagulation. In good spirits today, looking forward to going home Says he feels pretty good, no specific complaints.  No complaints of abdominal pain, no melena.  Hemoglobin 7.6 this a.m. stable NA 128 Creat 1.44    Objective   Vital signs in last 24 hours: Temp:  [97.8 F (36.6 C)-98.6 F (37 C)] 98.6 F (37 C) (11/25 0222) Pulse Rate:  [97-119] 106 (11/25 0222) Resp:  [14-21] 17 (11/25 0222) BP: (128-162)/(55-73) 149/69 (11/25 0222) SpO2:  [96 %-100 %] 100 % (11/25 0222) Last BM Date: 06/08/19 General:    Older white male in NAD, pleasant chronically ill-appearing Heart:  Regular rate and rhythm; no murmurs Lungs: Respirations even and unlabored, lungs CTA bilaterally Abdomen:  Soft, protuberant with large amount of ascites normal bowel sounds nontender Extremities: 1+ edema bilaterally Neurologic:  Alert and oriented,  grossly normal neurologically no asterixis Psych:  Cooperative. Normal mood and affect.  Intake/Output from previous day: 11/24 0701 - 11/25 0700 In: 953.3 [P.O.:840; IV Piggyback:113.3] Out: -  Intake/Output this shift: No intake/output data recorded.  Lab Results: Recent Labs    06/08/19 2030 06/09/19 0219 06/09/19 0805  HGB 7.7* 7.8* 7.6*  HCT 25.2* 25.1* 25.4*   BMET Recent Labs    06/08/19 0810 06/09/19 0219  NA 128* 128*  K 3.1* 3.6  CL 100 104  CO2 18* 15*  GLUCOSE 98 116*  BUN 31* 30*  CREATININE 1.56* 1.44*  CALCIUM 8.3* 7.9*   LFT Recent Labs    06/08/19 0810  PROT 5.5*  ALBUMIN 2.2*  AST 33  ALT 24  ALKPHOS 90  BILITOT 0.7   PT/INR Recent Labs    06/07/19 0224  LABPROT 17.2*  INR 1.4*    Studies/Results: Ir Ivc Filter Plmt / S&i /img Guid/mod Sed  Result Date: 06/07/2019 INDICATION: 73 year old with recent diagnosis  of right lower extremity DVT in September 2020. He was subsequently placed on Eliquis but presents with bleeding of esophageal/gastric varices. He has cirrhosis and portal hypertension. This represents an absolute contraindication to further anticoagulation and he is therefore a candidate for PE prophylaxis by caval interruption. He presents for placement of an IVC filter. EXAM: ULTRASOUND GUIDANCE FOR VASCULARACCESS IVC CATHETERIZATION AND VENOGRAM IVC FILTER INSERTION Interventional Radiologist:  Criselda Peaches, MD MEDICATIONS: None. ANESTHESIA/SEDATION: Fentanyl 100 mcg IV; Versed 2 mg IV Moderate Sedation Time:  12 minutes The patient was continuously monitored during the procedure by the interventional radiology nurse under my direct supervision. FLUOROSCOPY TIME:  Fluoroscopy Time: 0 minutes 30 seconds (165 mGy). COMPLICATIONS: None immediate. PROCEDURE: Informed written consent was obtained from the patient after a thorough discussion of the procedural risks, benefits and alternatives. All questions were addressed. Maximal Sterile Barrier Technique was utilized including caps, mask, sterile gowns, sterile gloves, sterile drape, hand hygiene and skin antiseptic. A timeout was performed prior to the initiation of the procedure. Maximal barrier sterile technique utilized including caps, mask, sterile gowns, sterile gloves, large sterile drape, hand hygiene, and Betadine prep. Under sterile condition and local anesthesia, right internal jugular venous access was performed with ultrasound. An ultrasound image was saved and sent to PACS. Over a guidewire, the IVC filter delivery sheath and inner dilator were advanced into the IVC just above the IVC bifurcation. Contrast injection was performed  for an IVC venogram. Through the delivery sheath, a retrievable Denali IVC filter was deployed below the level of the renal veins and above the IVC bifurcation. Limited post deployment venacavagram was performed. The  delivery sheath was removed and hemostasis was obtained with manual compression. A dressing was placed. The patient tolerated the procedure well without immediate post procedural complication. FINDINGS: The IVC is patent. No evidence of thrombus, stenosis, or occlusion. No variant venous anatomy. Successful placement of the IVC filter below the level of the renal veins. IMPRESSION: Successful ultrasound and fluoroscopically guided placement of an infrarenal retrievable IVC filter via right jugular approach. PLAN: This IVC filter is potentially retrievable. The patient will be assessed for filter retrieval by Interventional Radiology in approximately 8-12 weeks. Further recommendations regarding filter retrieval, continued surveillance or declaration of device permanence, will be made at that time. Electronically Signed   By: Jacqulynn Cadet M.D.   On: 06/07/2019 13:10   US Renal  Result Date: 06/08/2019 CLINICAL DATA:  Renal failure and hypertension EXAM: RENAL / URINARY TRACT ULTRASOUND COMPLETE COMPARISON:  CTA from the previous day. FINDINGS: Right Kidney: Renal measurements: 10.9 x 4.0 x 5.2 cm. = volume: 117 mL. Mild increased echogenicity is noted. Left Kidney: Renal measurements: 10.8 x 5.3 x 4.8 cm. = volume: 144 mL. Mild increased echogenicity is noted. Bladder: Appears normal for degree of bladder distention. Other: Considerable ascites is noted consistent with cirrhosis IMPRESSION: Increased echogenicity within the kidneys consistent with medical renal disease. Ascites is noted similar to that seen on prior CT consistent with underlying cirrhosis. Electronically Signed   By: Inez Catalina M.D.   On: 06/08/2019 19:40   Ct Angio Abd/pel W/ And/or W/o  Result Date: 06/07/2019 CLINICAL DATA:  73 year old male with alcoholic cirrhosis complicated by ascites and bleeding esophageal varices. Bleeding occurred in the setting of anticoagulation for recent right lower extremity DVT. An IVC filter was  just placed. Evaluate anatomy for possible TIPS versus BRTO. EXAM: CTA ABDOMEN AND PELVIS WITHOUT AND WITH CONTRAST TECHNIQUE: Multidetector CT imaging of the abdomen and pelvis was performed using the standard protocol during bolus administration of intravenous contrast. Multiplanar reconstructed images and MIPs were obtained and reviewed to evaluate the vascular anatomy. CONTRAST:  74mL OMNIPAQUE IOHEXOL 350 MG/ML SOLN COMPARISON:  None. FINDINGS: VASCULAR Aorta: Heterogeneous atherosclerotic plaque throughout the abdominal aorta. No evidence of aneurysm or dissection. Celiac: Patent without evidence of aneurysm, dissection, vasculitis or significant stenosis. SMA: Patent without evidence of aneurysm, dissection, vasculitis or significant stenosis. Replaced right hepatic artery. Renals: Solitary renal arteries bilaterally. Calcified atherosclerotic plaque at the origins without significant stenosis. IMA: Patent without evidence of aneurysm, dissection, vasculitis or significant stenosis. Inflow: Calcified atherosclerotic plaque without significant stenosis or occlusion. Proximal Outflow: Calcified atherosclerotic plaque without significant stenosis or occlusion. Veins: The right and left portal veins are patent. Nonocclusive thrombus is present in the main portal vein. I patent channel persists. The splenic vein is widely patent. 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 The presence of calcification associated with the thrombus suggests that this is a chronic process. No significant gastro renal shunt or gastric varices. Review of the MIP images confirms the above findings. NON-VASCULAR Lower chest: Small right pleural effusion with associated right lower lobe atelectasis. The visualized heart is normal in size. Calcifications present along the coronary arteries. No pericardial effusion. Hepatobiliary: Morphologically cirrhotic liver. The liver is small and extremely nodular. Small region of  non masslike arterial hyperenhancement in hepatic segment 3 measures  approximately 1.4 cm. No convincing evidence of washout on the delayed images. The gallbladder is surgically absent. Pancreas: Diffuse atrophy of the pancreas. Spleen: Mild splenomegaly. Adrenals/Urinary Tract: Normal adrenal glands. No evidence of hydronephrosis, nephrolithiasis or enhancing renal mass. Unremarkable ureters and bladder. Stomach/Bowel: Mild submucosal edema throughout the small bowel consistent with portal enteropathy. Colonic diverticular disease without CT evidence of active inflammation. Normal appendix. Lymphatic: No suspicious adenopathy. Reproductive: Prostate is unremarkable. Other: Large volume ascites. Mesenteric edema. Right inguinal hernia with ascites extending through the inguinal canal and into the scrotum. Musculoskeletal: The bones are osteopenic, particularly in the pelvis and proximal femurs. No discrete lytic or blastic osseous lesion. Multilevel degenerative disc disease. Bilateral lower lumbar facet arthropathy. Degenerative changes at the disc spacer most significant at L5-S1. There is mild degenerative anterolisthesis of L4 on L5. IMPRESSION: VASCULAR 1. 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 Anatomically, patient could be considered for TIPS creation. 2. 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 3. No evidence of gastro renal shunt or gastric varices. 4. Replaced right hepatic artery. 5.  Aortic Atherosclerosis (ICD10-170.0). NON-VASCULAR 1. Small focus of non-masslike arterial hyperenhancement in hepatic segment 3 measures up to 1.4 cm but demonstrates no definitive washout on the portal venous phase imaging. Given the non masslike appearance, this is favored to represent a small transient hepatic attenuation difference due to a vascular anomaly. However, in the setting of cirrhosis,  early hepatocellular carcinoma is difficult to exclude entirely. Recommend further evaluation with MRI of the abdomen enhanced with gadolinium contrast. 2. Large ascites extending through a right inguinal hernia into the scrotum. 3. Small right pleural effusion and associated atelectasis. 4. Colonic diverticular disease without CT evidence of active inflammation. 5. Lower lumbar degenerative disc disease and facet arthropathy. Signed, Criselda Peaches, MD, [place] [facility]iology Electronically Signed   By: Jacqulynn Cadet M.D.   On: 06/07/2019 15:11       Assessment / Plan:    #1 Complicated 73 year old male with decompensated EtOH induced cirrhosis admitted with recurrent GI bleeding in setting of Eliquis.  Endoscopic evaluation with EGD and colonoscopy this admission with finding of grade 2 nonbleeding esophageal varices, significant portal gastropathy and portal duodenopathy- Also with extensive portal colopathy  Anticipate patient will have ongoing oozing from extensive portal hypertension affecting the entire GI tract from stomach to colon.  He is stable without active bleeding after placement of IVC filter and off Eliquis  #2 anemia secondary to acute blood loss-significant but stable  #3 acute kidney injury on chronic kidney disease-currently off diuretics with plan to leave off diuretics for now, parameters stable. Appreciate Dr. Schertz/nephrology consult yesterday.  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 Had been on midodrine, currently on hold due to high blood pressure  #4 volume overload/significant ascites.  Cannot tolerate diuretics at present due to kidney function. He has been evaluated by IR and is felt to be acceptable candidate for TIPS. TIPS will significantly help volume management and leading from portal gastropathy, enteropathy and colopathy.  Plan:  Patient to be discharged home today Holding diuretics, hold midodrine Off  Eliquis Continue twice daily PPI and Carafate suspension between meals and bedtime for grade C esophagitis Patient cannot tolerate lactulose due to severe diarrhea, can use as needed.  Will arrange for labs on Monday at our office,, then follow-up with Dr. Carlean Purl, and plans to move forward with TIPS within  the next couple of weeks.  Principal Problem:   GI bleed Active Problems:   Essential hypertension, benign   Portal hypertensive gastropathy (HCC)   Iron deficiency anemia due to chronic blood loss   Angiodysplasia of colon   Alcoholic cirrhosis of liver (HCC)   Hydrocele in adult   Right leg DVT (HCC)   Chronic renal disease, stage III   Melena   Rectal bleeding    LOS: 5 days   Amy EsterwoodPA-C  06/09/2019, 9:12 AM      Attending Physician Note   I have taken an interval history, reviewed the chart and examined the patient. I agree with the Advanced Practitioner's note, impression and recommendations. Plans, recommendations as outlined above were formulated with APP, Dr. Carlean Purl, me, IR, nephrology and hospitalists. I have discussed with Dr. Florencia Reasons today.   Lucio Edward, MD [facility]

## 2019-06-09 NOTE — Telephone Encounter (Signed)
-----  Message from Amy S Esterwood, PA-C sent at 06/09/2019 11:29 AM EST ----- Regarding: labs, and follow up Patient being discharged today.  Hopefully if numbers stable to improved, you can get him back into IR for TIPS very soon  -please place orders for CBC, c-Met, INR for Monday, 06/14/2019.  Place orders under Dr. Gessner's name.  

## 2019-06-14 ENCOUNTER — Other Ambulatory Visit (INDEPENDENT_AMBULATORY_CARE_PROVIDER_SITE_OTHER): Payer: 59

## 2019-06-14 ENCOUNTER — Encounter: Payer: Self-pay | Admitting: Internal Medicine

## 2019-06-14 ENCOUNTER — Telehealth: Payer: Self-pay

## 2019-06-14 DIAGNOSIS — K7031 Alcoholic cirrhosis of liver with ascites: Secondary | ICD-10-CM | POA: Diagnosis not present

## 2019-06-14 DIAGNOSIS — K767 Hepatorenal syndrome: Secondary | ICD-10-CM

## 2019-06-14 HISTORY — DX: Hepatorenal syndrome: K76.7

## 2019-06-14 LAB — CBC
HCT: 27.2 % — ABNORMAL LOW (ref 39.0–52.0)
Hemoglobin: 8.4 g/dL — ABNORMAL LOW (ref 13.0–17.0)
MCHC: 30.9 g/dL (ref 30.0–36.0)
MCV: 96.2 fl (ref 78.0–100.0)
Platelets: 179 10*3/uL (ref 150.0–400.0)
RBC: 2.83 Mil/uL — ABNORMAL LOW (ref 4.22–5.81)
RDW: 18.7 % — ABNORMAL HIGH (ref 11.5–15.5)
WBC: 8 10*3/uL (ref 4.0–10.5)

## 2019-06-14 LAB — COMPREHENSIVE METABOLIC PANEL
ALT: 22 U/L (ref 0–53)
AST: 28 U/L (ref 0–37)
Albumin: 2.3 g/dL — ABNORMAL LOW (ref 3.5–5.2)
Alkaline Phosphatase: 107 U/L (ref 39–117)
BUN: 38 mg/dL — ABNORMAL HIGH (ref 6–23)
CO2: 19 mEq/L (ref 19–32)
Calcium: 8.4 mg/dL (ref 8.4–10.5)
Chloride: 101 mEq/L (ref 96–112)
Creatinine, Ser: 2.52 mg/dL — ABNORMAL HIGH (ref 0.40–1.50)
GFR: 25.34 mL/min — ABNORMAL LOW (ref >60.00)
Glucose, Bld: 129 mg/dL — ABNORMAL HIGH (ref 70–99)
Potassium: 4.4 mEq/L (ref 3.5–5.1)
Sodium: 129 mEq/L — ABNORMAL LOW (ref 135–145)
Total Bilirubin: 0.6 mg/dL (ref 0.2–1.2)
Total Protein: 5.4 g/dL — ABNORMAL LOW (ref 6.0–8.3)

## 2019-06-14 LAB — PROTIME-INR
INR: 1.3 ratio — ABNORMAL HIGH (ref 0.8–1.0)
Prothrombin Time: 14.6 s — ABNORMAL HIGH (ref 9.6–13.1)

## 2019-06-14 NOTE — Progress Notes (Signed)
Mr. Goscinski,  I called but did not reach you - got voicemail.  The kidney function is worse, unfortunately.  We will contact you again tomorrow but if you see this tonight send a message back about how swollen your legs, abdomen and scrotum are.  Unfortunately medication and the TIPS will not be helpful and would cause harm. We can drain fluid as needed.  I will explain more when we can talk.   Gatha Mayer, MD, [facility]

## 2019-06-14 NOTE — Telephone Encounter (Signed)
Billy [name], we are able to do [facility], right?

## 2019-06-14 NOTE — Telephone Encounter (Signed)
Transition Care Management Follow-up Telephone Call  Date of discharge and from where: 06/09/2019, [facility]   How have you been since you were released from the hospital? Patient states that he is feeling much better. Patient states that his legs look fine but his feet are swollen. No pain or redness noted.   Any questions or concerns? No   Items Reviewed:  Did the pt receive and understand the discharge instructions provided? Yes   Medications obtained and verified? Yes   Any new allergies since your discharge? No   Dietary orders reviewed? Yes  Do you have support at home? Yes   Functional Questionnaire: (I = Independent and D = Dependent) ADLs: I  Bathing/Dressing- I  Meal Prep- I  Eating- I  Maintaining continence- I  Transferring/Ambulation- I  Managing Meds- I  Follow up appointments reviewed:   PCP Hospital f/u appt confirmed? Yes  Scheduled to see Dr. Silvio Pate on 06/23/2019 @ 12 pm.  [facility] f/u appt confirmed? Yes , Patient states he will schedule follow up with specialist.  Are transportation arrangements needed? No   If their condition worsens, is the pt aware to call PCP or go to the Emergency Dept.? Yes  Was the patient provided with contact information for the PCP's office or ED? Yes  Was to pt encouraged to call back with questions or concerns? Yes

## 2019-06-14 NOTE — Telephone Encounter (Signed)
I contacted the patient and he states he is a patient of Dr. Carlean Purl at [facility].

## 2019-06-14 NOTE — Telephone Encounter (Signed)
Yes, any [facility] office, orders have to be in

## 2019-06-14 NOTE — Telephone Encounter (Addendum)
If they are not part of [facility] then we cannot do it. Follow-up with Leafy Ro or Karna Christmas just to make sure. Thanks

## 2019-06-14 NOTE — Telephone Encounter (Signed)
I left VM for patient stating that we could draw his labs for GI office. I asked that he call back with any questions.

## 2019-06-14 NOTE — Telephone Encounter (Signed)
Patient contacted the office asking if we could draw the labs for his GI office, as our office is closer? Please advise.

## 2019-06-15 ENCOUNTER — Other Ambulatory Visit: Payer: Self-pay

## 2019-06-15 ENCOUNTER — Telehealth: Payer: Self-pay | Admitting: Internal Medicine

## 2019-06-15 DIAGNOSIS — K7031 Alcoholic cirrhosis of liver with ascites: Secondary | ICD-10-CM

## 2019-06-15 NOTE — Telephone Encounter (Signed)
Patient was to have a second dose of iron, but has been admitted again and did not get the second infusion. Does he need a repeat Infed infusion at this time?

## 2019-06-15 NOTE — Progress Notes (Signed)
Billy [name], I spoke to him and explained kidney fx much worse and probably not treatable.  I want him to get another BMET  On 12/2 - he would like to do at [facility] so arrange that please.  I originally said 12/3 but after talking to kidney specialist I want to do 12/2.  If we cannot add a ferritin to yesterday's labs let's do that at [facility] also.  Thanks

## 2019-06-15 NOTE — Telephone Encounter (Signed)
See if they can add a ferritin to yesterday's labs and I will let you know

## 2019-06-15 NOTE — Telephone Encounter (Signed)
See results notes from today.  Patient will have ferritin tomorrow with BMET at [facility] office

## 2019-06-16 ENCOUNTER — Other Ambulatory Visit (INDEPENDENT_AMBULATORY_CARE_PROVIDER_SITE_OTHER): Payer: 59

## 2019-06-16 ENCOUNTER — Other Ambulatory Visit: Payer: Self-pay

## 2019-06-16 DIAGNOSIS — K7031 Alcoholic cirrhosis of liver with ascites: Secondary | ICD-10-CM | POA: Diagnosis not present

## 2019-06-16 LAB — BASIC METABOLIC PANEL
BUN: 46 mg/dL — ABNORMAL HIGH (ref 6–23)
CO2: 18 mEq/L — ABNORMAL LOW (ref 19–32)
Calcium: 8.2 mg/dL — ABNORMAL LOW (ref 8.4–10.5)
Chloride: 102 mEq/L (ref 96–112)
Creatinine, Ser: 2.52 mg/dL — ABNORMAL HIGH (ref 0.40–1.50)
GFR: 25.34 mL/min — ABNORMAL LOW (ref >60.00)
Glucose, Bld: 108 mg/dL — ABNORMAL HIGH (ref 70–99)
Potassium: 3.7 mEq/L (ref 3.5–5.1)
Sodium: 129 mEq/L — ABNORMAL LOW (ref 135–145)

## 2019-06-16 LAB — FERRITIN: Ferritin: 398.1 ng/mL — ABNORMAL HIGH (ref 22.0–322.0)

## 2019-06-16 NOTE — Progress Notes (Signed)
Kidney function stable but still abnormal Message left Will call tomorrow

## 2019-06-17 ENCOUNTER — Telehealth: Payer: Self-pay | Admitting: Internal Medicine

## 2019-06-17 ENCOUNTER — Inpatient Hospital Stay (HOSPITAL_COMMUNITY): Payer: 59

## 2019-06-17 ENCOUNTER — Inpatient Hospital Stay (HOSPITAL_COMMUNITY)
Admission: AD | Admit: 2019-06-17 | Discharge: 2019-06-23 | DRG: 441 | Disposition: A | Discharge status: Home / Self Care | Payer: 59 | Source: Ambulatory Visit | Attending: Internal Medicine | Admitting: Internal Medicine

## 2019-06-17 DIAGNOSIS — M5136 Other intervertebral disc degeneration, lumbar region: Secondary | ICD-10-CM | POA: Diagnosis present

## 2019-06-17 DIAGNOSIS — Z6821 Body mass index (BMI) 21.0-21.9, adult: Secondary | ICD-10-CM

## 2019-06-17 DIAGNOSIS — E871 Hypo-osmolality and hyponatremia: Secondary | ICD-10-CM | POA: Diagnosis present

## 2019-06-17 DIAGNOSIS — R7989 Other specified abnormal findings of blood chemistry: Secondary | ICD-10-CM | POA: Diagnosis present

## 2019-06-17 DIAGNOSIS — I8510 Secondary esophageal varices without bleeding: Secondary | ICD-10-CM | POA: Diagnosis present

## 2019-06-17 DIAGNOSIS — Z9842 Cataract extraction status, left eye: Secondary | ICD-10-CM

## 2019-06-17 DIAGNOSIS — Z85828 Personal history of other malignant neoplasm of skin: Secondary | ICD-10-CM

## 2019-06-17 DIAGNOSIS — G479 Sleep disorder, unspecified: Secondary | ICD-10-CM | POA: Diagnosis present

## 2019-06-17 DIAGNOSIS — K766 Portal hypertension: Secondary | ICD-10-CM | POA: Diagnosis present

## 2019-06-17 DIAGNOSIS — Z801 Family history of malignant neoplasm of trachea, bronchus and lung: Secondary | ICD-10-CM

## 2019-06-17 DIAGNOSIS — N170 Acute kidney failure with tubular necrosis: Secondary | ICD-10-CM | POA: Diagnosis present

## 2019-06-17 DIAGNOSIS — M5137 Other intervertebral disc degeneration, lumbosacral region: Secondary | ICD-10-CM | POA: Diagnosis present

## 2019-06-17 DIAGNOSIS — Z7189 Other specified counseling: Secondary | ICD-10-CM | POA: Diagnosis not present

## 2019-06-17 DIAGNOSIS — K7030 Alcoholic cirrhosis of liver without ascites: Secondary | ICD-10-CM | POA: Diagnosis present

## 2019-06-17 DIAGNOSIS — D631 Anemia in chronic kidney disease: Secondary | ICD-10-CM | POA: Diagnosis present

## 2019-06-17 DIAGNOSIS — Z961 Presence of intraocular lens: Secondary | ICD-10-CM | POA: Diagnosis present

## 2019-06-17 DIAGNOSIS — D500 Iron deficiency anemia secondary to blood loss (chronic): Secondary | ICD-10-CM | POA: Diagnosis present

## 2019-06-17 DIAGNOSIS — E872 Acidosis: Secondary | ICD-10-CM | POA: Diagnosis not present

## 2019-06-17 DIAGNOSIS — F17210 Nicotine dependence, cigarettes, uncomplicated: Secondary | ICD-10-CM | POA: Diagnosis present

## 2019-06-17 DIAGNOSIS — N179 Acute kidney failure, unspecified: Secondary | ICD-10-CM | POA: Diagnosis not present

## 2019-06-17 DIAGNOSIS — Z79891 Long term (current) use of opiate analgesic: Secondary | ICD-10-CM

## 2019-06-17 DIAGNOSIS — K767 Hepatorenal syndrome: Principal | ICD-10-CM

## 2019-06-17 DIAGNOSIS — Z66 Do not resuscitate: Secondary | ICD-10-CM | POA: Diagnosis not present

## 2019-06-17 DIAGNOSIS — N183 Chronic kidney disease, stage 3 unspecified: Secondary | ICD-10-CM | POA: Diagnosis present

## 2019-06-17 DIAGNOSIS — Z9841 Cataract extraction status, right eye: Secondary | ICD-10-CM

## 2019-06-17 DIAGNOSIS — F17290 Nicotine dependence, other tobacco product, uncomplicated: Secondary | ICD-10-CM | POA: Diagnosis present

## 2019-06-17 DIAGNOSIS — R188 Other ascites: Secondary | ICD-10-CM

## 2019-06-17 DIAGNOSIS — I5032 Chronic diastolic (congestive) heart failure: Secondary | ICD-10-CM | POA: Diagnosis present

## 2019-06-17 DIAGNOSIS — Z8719 Personal history of other diseases of the digestive system: Secondary | ICD-10-CM

## 2019-06-17 DIAGNOSIS — F419 Anxiety disorder, unspecified: Secondary | ICD-10-CM | POA: Diagnosis present

## 2019-06-17 DIAGNOSIS — Z833 Family history of diabetes mellitus: Secondary | ICD-10-CM

## 2019-06-17 DIAGNOSIS — K7031 Alcoholic cirrhosis of liver with ascites: Secondary | ICD-10-CM | POA: Diagnosis present

## 2019-06-17 DIAGNOSIS — F1020 Alcohol dependence, uncomplicated: Secondary | ICD-10-CM | POA: Diagnosis present

## 2019-06-17 DIAGNOSIS — Z86718 Personal history of other venous thrombosis and embolism: Secondary | ICD-10-CM

## 2019-06-17 DIAGNOSIS — Z79899 Other long term (current) drug therapy: Secondary | ICD-10-CM

## 2019-06-17 DIAGNOSIS — K219 Gastro-esophageal reflux disease without esophagitis: Secondary | ICD-10-CM | POA: Diagnosis present

## 2019-06-17 DIAGNOSIS — I130 Hypertensive heart and chronic kidney disease with heart failure and stage 1 through stage 4 chronic kidney disease, or unspecified chronic kidney disease: Secondary | ICD-10-CM | POA: Diagnosis present

## 2019-06-17 DIAGNOSIS — D6959 Other secondary thrombocytopenia: Secondary | ICD-10-CM | POA: Diagnosis present

## 2019-06-17 DIAGNOSIS — Z515 Encounter for palliative care: Secondary | ICD-10-CM | POA: Diagnosis not present

## 2019-06-17 DIAGNOSIS — F32A Depression, unspecified: Secondary | ICD-10-CM | POA: Diagnosis present

## 2019-06-17 DIAGNOSIS — K3189 Other diseases of stomach and duodenum: Secondary | ICD-10-CM | POA: Diagnosis present

## 2019-06-17 DIAGNOSIS — K709 Alcoholic liver disease, unspecified: Secondary | ICD-10-CM | POA: Diagnosis not present

## 2019-06-17 DIAGNOSIS — Z9049 Acquired absence of other specified parts of digestive tract: Secondary | ICD-10-CM

## 2019-06-17 DIAGNOSIS — I10 Essential (primary) hypertension: Secondary | ICD-10-CM | POA: Diagnosis present

## 2019-06-17 DIAGNOSIS — Z886 Allergy status to analgesic agent status: Secondary | ICD-10-CM

## 2019-06-17 DIAGNOSIS — E8770 Fluid overload, unspecified: Secondary | ICD-10-CM | POA: Diagnosis present

## 2019-06-17 DIAGNOSIS — Z20828 Contact with and (suspected) exposure to other viral communicable diseases: Secondary | ICD-10-CM | POA: Diagnosis present

## 2019-06-17 DIAGNOSIS — F329 Major depressive disorder, single episode, unspecified: Secondary | ICD-10-CM | POA: Diagnosis present

## 2019-06-17 DIAGNOSIS — F1120 Opioid dependence, uncomplicated: Secondary | ICD-10-CM | POA: Diagnosis present

## 2019-06-17 DIAGNOSIS — Z800 Family history of malignant neoplasm of digestive organs: Secondary | ICD-10-CM

## 2019-06-17 DIAGNOSIS — Z8042 Family history of malignant neoplasm of prostate: Secondary | ICD-10-CM

## 2019-06-17 DIAGNOSIS — K259 Gastric ulcer, unspecified as acute or chronic, without hemorrhage or perforation: Secondary | ICD-10-CM | POA: Diagnosis present

## 2019-06-17 DIAGNOSIS — N1831 Chronic kidney disease, stage 3a: Secondary | ICD-10-CM | POA: Diagnosis not present

## 2019-06-17 DIAGNOSIS — R64 Cachexia: Secondary | ICD-10-CM | POA: Diagnosis present

## 2019-06-17 DIAGNOSIS — Z86010 Personal history of colonic polyps: Secondary | ICD-10-CM

## 2019-06-17 DIAGNOSIS — Z823 Family history of stroke: Secondary | ICD-10-CM

## 2019-06-17 DIAGNOSIS — N189 Chronic kidney disease, unspecified: Secondary | ICD-10-CM | POA: Diagnosis present

## 2019-06-17 LAB — COMPREHENSIVE METABOLIC PANEL
ALT: 27 U/L (ref 0–44)
AST: 36 U/L (ref 15–41)
Albumin: 2.2 g/dL — ABNORMAL LOW (ref 3.5–5.0)
Alkaline Phosphatase: 112 U/L (ref 38–126)
Anion gap: 11 (ref 5–15)
BUN: 48 mg/dL — ABNORMAL HIGH (ref 8–23)
CO2: 17 mmol/L — ABNORMAL LOW (ref 22–32)
Calcium: 8 mg/dL — ABNORMAL LOW (ref 8.9–10.3)
Chloride: 101 mmol/L (ref 98–111)
Creatinine, Ser: 2.67 mg/dL — ABNORMAL HIGH (ref 0.61–1.24)
GFR calc Af Amer: 27 mL/min — ABNORMAL LOW (ref >60)
GFR calc non Af Amer: 23 mL/min — ABNORMAL LOW (ref >60)
Glucose, Bld: 102 mg/dL — ABNORMAL HIGH (ref 70–99)
Potassium: 4 mmol/L (ref 3.5–5.1)
Sodium: 129 mmol/L — ABNORMAL LOW (ref 135–145)
Total Bilirubin: 0.8 mg/dL (ref 0.3–1.2)
Total Protein: 5.5 g/dL — ABNORMAL LOW (ref 6.5–8.1)

## 2019-06-17 LAB — TSH: TSH: 6.511 u[IU]/mL — ABNORMAL HIGH (ref 0.350–4.500)

## 2019-06-17 LAB — MAGNESIUM: Magnesium: 1.9 mg/dL (ref 1.7–2.4)

## 2019-06-17 LAB — PHOSPHORUS: Phosphorus: 4.5 mg/dL (ref 2.5–4.6)

## 2019-06-17 MED ORDER — MIDODRINE HCL 5 MG PO TABS
7.5000 mg | ORAL_TABLET | Freq: Three times a day (TID) | ORAL | Status: DC
  Administered 2019-06-18 – 2019-06-23 (×16): 7.5 mg via ORAL
  Filled 2019-06-17 (×18): qty 1

## 2019-06-17 MED ORDER — HYDROCODONE-ACETAMINOPHEN 5-325 MG PO TABS
1.0000 | ORAL_TABLET | Freq: Four times a day (QID) | ORAL | Status: DC | PRN
  Administered 2019-06-17 – 2019-06-19 (×5): 1 via ORAL
  Filled 2019-06-17 (×5): qty 1

## 2019-06-17 MED ORDER — SODIUM CHLORIDE 0.9 % IV SOLN
50.0000 ug/h | INTRAVENOUS | Status: DC
  Administered 2019-06-18 – 2019-06-23 (×11): 50 ug/h via INTRAVENOUS
  Filled 2019-06-17 (×15): qty 1

## 2019-06-17 MED ORDER — PANTOPRAZOLE SODIUM 40 MG PO TBEC
40.0000 mg | DELAYED_RELEASE_TABLET | Freq: Two times a day (BID) | ORAL | Status: DC
  Administered 2019-06-18 – 2019-06-23 (×11): 40 mg via ORAL
  Filled 2019-06-17 (×12): qty 1

## 2019-06-17 MED ORDER — SUCRALFATE 1 GM/10ML PO SUSP
1.0000 g | Freq: Three times a day (TID) | ORAL | Status: DC
  Administered 2019-06-18 – 2019-06-23 (×15): 1 g via ORAL
  Filled 2019-06-17 (×15): qty 10

## 2019-06-17 MED ORDER — ADULT MULTIVITAMIN W/MINERALS CH
1.0000 | ORAL_TABLET | Freq: Every day | ORAL | Status: DC
  Administered 2019-06-18 – 2019-06-23 (×6): 1 via ORAL
  Filled 2019-06-17 (×6): qty 1

## 2019-06-17 MED ORDER — VITAMIN C 500 MG PO TABS
250.0000 mg | ORAL_TABLET | Freq: Every day | ORAL | Status: DC
  Administered 2019-06-18 – 2019-06-23 (×6): 250 mg via ORAL
  Filled 2019-06-17 (×7): qty 1

## 2019-06-17 MED ORDER — FERROUS SULFATE 325 (65 FE) MG PO TABS
325.0000 mg | ORAL_TABLET | ORAL | Status: DC
  Administered 2019-06-19 – 2019-06-23 (×3): 325 mg via ORAL
  Filled 2019-06-17 (×3): qty 1

## 2019-06-17 MED ORDER — BUPROPION HCL ER (SR) 150 MG PO TB12
150.0000 mg | ORAL_TABLET | Freq: Every day | ORAL | Status: DC
  Administered 2019-06-18 – 2019-06-23 (×6): 150 mg via ORAL
  Filled 2019-06-17 (×6): qty 1

## 2019-06-17 MED ORDER — ALBUMIN HUMAN 25 % IV SOLN
75.0000 g | Freq: Every day | INTRAVENOUS | Status: AC
  Administered 2019-06-17 – 2019-06-18 (×2): 75 g via INTRAVENOUS
  Filled 2019-06-17 (×2): qty 300

## 2019-06-17 MED ORDER — MIRTAZAPINE 30 MG PO TABS
45.0000 mg | ORAL_TABLET | Freq: Every day | ORAL | Status: DC
  Administered 2019-06-17 – 2019-06-22 (×6): 45 mg via ORAL
  Filled 2019-06-17 (×6): qty 1

## 2019-06-17 MED ORDER — LACTULOSE 10 GM/15ML PO SOLN
20.0000 g | Freq: Every day | ORAL | Status: DC
  Administered 2019-06-18 – 2019-06-23 (×6): 20 g via ORAL
  Filled 2019-06-17 (×6): qty 30

## 2019-06-17 NOTE — Plan of Care (Signed)
73 yo male with hepatorenal syndrome coming from home with increasing creatinine from 1.4-2.5.  He has hepatorenal syndrome type II.  Last time he was admitted to the hospital he was seen by nephrology and thought he would not respond to octreotide albumin and midodrine with hepatorenal syndrome type II.  However Dr. Carlean Purl would like to try to see if this would help.  If this helps and improves his kidney function he could be a potential candidate for TIPS and he has seen IR already. When he comes to the hospital please consult nephrology and palliative care.

## 2019-06-17 NOTE — H&P (Signed)
History and Physical  Billy [name] [id number] DOB: [birth year] DOA: 06/17/2019  Referring physician: GI physician, Dr. Carlean Purl PCP: [name], MD  Outpatient Specialists: GI team. Patient coming from: Home  Chief Complaint: Worsening serum creatinine with concerns for hepatorenal syndrome type II  HPI:  Patient is a 73 year old Caucasian male with past medical history significant for liver cirrhosis with complications, scrotal edema, GI bleed, alcohol abuse, DVT but no longer able to tolerate anticoagulation and chronic kidney disease stage III.  Patient was sent for direct admission from the GI physicians office, Dr. Carlean Purl, with concerns for possible hepatorenal syndrome.  Apparently, patient's serum creatinine had gone from 1.4 to 2.5.  No reported constitutional symptoms.  Specifically, patient denied headache, neck pain, chest pain, shortness of breath, fever chills, GI symptoms or urinary symptoms.  Patient be admitted for further assessment and management.  Review of Systems:  Negative for fever, visual changes, sore throat, rash, new muscle aches, chest pain, SOB, dysuria, bleeding, n/v/abdominal pain.  Past Medical History:  Diagnosis Date  . Acute deep vein thrombosis (DVT) of femoral vein of right lower extremity ([place]) 03/30/2019  . Acute posthemorrhagic anemia   . 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 Progressed from fatty liver to cirrhosis based upon review imaging. Manifestations are portal hypertension with esophageal varices, portal gastropathy, rectal varices and he may have portal colopathy. Ascites also present. Naive to HAV and HBV Claims he has had pneumococcal vaccine   . Alcoholism ([place])   . Angiodysplasia of colon 09/11/2011   Multiple in the right colon, ablated with APC. Question if this is portal colopathy.   . Anxiety   . Back pain   . Basal cell carcinoma   . Bilateral varicoceles   . Campylobacter diarrhea  01/07/2012  . Cataract    removed  . DDD (degenerative disc disease), lumbar   . Depression    mild at most  . Diverticulosis of colon   . Erectile dysfunction   . Gastric ulcer 08/2011   small, antral ulcer  . GERD (gastroesophageal reflux disease)    takes Omeprazole prn  . GI hemorrhage 2006, 2013   esophagitis and 1+ varices, melena in 2013   . Grade I diastolic dysfunction   . Hearing loss   . Hepatorenal syndrome (HCC)-Type 2 06/14/2019  . History of ascites   . History of blood transfusion    as a child and no abnormal reaction noted  . History of bronchitis    >84yrs ago  . History of dizziness   . Hydrocele   . Hypertension    takes Nadolol daily  . Narcotic dependence ([place])   . Personal history of colonic polyps   . Portal hypertension ([place]) 08/2011  . 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 Right hydrocele   . Scrotal edema   . Sleep disturbance    takes Remeron nightly  . Splenomegaly   . Thrombocytopenia, unspecified ([place])   . 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 BIOPSY  08/27/2018   Procedure: BIOPSY;  Surgeon: Milus Banister, MD;  Location: [facility];  Service: Endoscopy;;  . CATARACT EXTRACTION  2013   bilateral with IOL  . CHOLECYSTECTOMY  12/31/2012   Procedure: LAPAROSCOPIC CHOLECYSTECTOMY;  Surgeon: Zenovia Jarred, MD;  Location: [facility];  Service: General;;  . COLONOSCOPY  2001, 200411/02/2006  . COLONOSCOPY  09/11/2011  Procedure: COLONOSCOPY;  Surgeon: Gatha Mayer, MD;  [facility];  Service: Endoscopy;  Laterality: N/A;  . COLONOSCOPY    . COLONOSCOPY WITH PROPOFOL N/A 06/07/2019   Procedure: COLONOSCOPY WITH PROPOFOL;  Surgeon: Ladene Artist, MD;  [facility]: [facility];  Service: Endoscopy;  Laterality: N/A;  . ESOPHAGEAL BANDING N/A 05/02/2017   Procedure: ESOPHAGEAL BANDING;  Surgeon: Gatha Mayer, MD;  [facility];  Service: Endoscopy;   Laterality: N/A;  . ESOPHAGEAL BANDING  06/09/2018   Procedure: ESOPHAGEAL BANDING;  Surgeon: Gatha Mayer, MD;  [facility];  Service: Endoscopy;;  . ESOPHAGEAL BANDING  08/27/2018   Procedure: ESOPHAGEAL BANDING;  Surgeon: Milus Banister, MD;  [facility];  Service: Endoscopy;;  . ESOPHAGOGASTRODUODENOSCOPY  2006  . ESOPHAGOGASTRODUODENOSCOPY  09/11/2011   Procedure: ESOPHAGOGASTRODUODENOSCOPY (EGD);  Surgeon: Gatha Mayer, MD;  [facility];  Service: Endoscopy;  Laterality: N/A;  . ESOPHAGOGASTRODUODENOSCOPY N/A 08/27/2018   Procedure: ESOPHAGOGASTRODUODENOSCOPY (EGD);  Surgeon: Milus Banister, MD;  [facility];  Service: Endoscopy;  Laterality: N/A;  . ESOPHAGOGASTRODUODENOSCOPY (EGD) WITH PROPOFOL N/A 04/13/2017   Procedure: ESOPHAGOGASTRODUODENOSCOPY (EGD) WITH PROPOFOL;  Surgeon: Doran Stabler, MD;  [facility];  Service: Gastroenterology;  Laterality: N/A;  . ESOPHAGOGASTRODUODENOSCOPY (EGD) WITH PROPOFOL N/A 05/02/2017   Procedure: ESOPHAGOGASTRODUODENOSCOPY (EGD) WITH PROPOFOL;  Surgeon: Gatha Mayer, MD;  [facility];  Service: Endoscopy;  Laterality: N/A;  . ESOPHAGOGASTRODUODENOSCOPY (EGD) WITH PROPOFOL N/A 09/30/2017   Procedure: ESOPHAGOGASTRODUODENOSCOPY (EGD) WITH PROPOFOL;  Surgeon: Gatha Mayer, MD;  [facility];  Service: Endoscopy;  Laterality: N/A;  . ESOPHAGOGASTRODUODENOSCOPY (EGD) WITH PROPOFOL N/A 06/09/2018   Procedure: ESOPHAGOGASTRODUODENOSCOPY (EGD) WITH PROPOFOL;  Surgeon: Gatha Mayer, MD;  [facility];  Service: Endoscopy;  Laterality: N/A;  . ESOPHAGOGASTRODUODENOSCOPY (EGD) WITH PROPOFOL N/A 01/05/2019   Procedure: ESOPHAGOGASTRODUODENOSCOPY (EGD) WITH PROPOFOL;  Surgeon: Gatha Mayer, MD;  [facility];  Service: Endoscopy;  Laterality: N/A;  . ESOPHAGOGASTRODUODENOSCOPY (EGD) WITH PROPOFOL N/A 05/30/2019   Procedure: ESOPHAGOGASTRODUODENOSCOPY  (EGD) WITH PROPOFOL;  Surgeon: Carol Ada, MD;  [facility];  Service: Endoscopy;  Laterality: N/A;  . ESOPHAGOGASTRODUODENOSCOPY (EGD) WITH PROPOFOL N/A 06/07/2019   Procedure: ESOPHAGOGASTRODUODENOSCOPY (EGD) WITH PROPOFOL;  Surgeon: Ladene Artist, MD;  [facility];  Service: Endoscopy;  Laterality: N/A;  . EUS N/A 08/27/2018   Procedure: UPPER ENDOSCOPIC ULTRASOUND (EUS) RADIAL;  Surgeon: Milus Banister, MD;  [facility];  Service: Endoscopy;  Laterality: N/A;  . EYE SURGERY    . FLEXIBLE SIGMOIDOSCOPY  01/07/2012   Procedure: FLEXIBLE SIGMOIDOSCOPY;  Surgeon: Lafayette Dragon, MD;  [facility];  Service: Endoscopy;  Laterality: N/A;  . HOT HEMOSTASIS N/A 06/07/2019   Procedure: HOT HEMOSTASIS (ARGON PLASMA COAGULATION/BICAP);  Surgeon: Ladene Artist, MD;  Location[facility];  Service: Endoscopy;  Laterality: N/A;  . IR IVC FILTER PLMT / S&I Burke Keels GUID/MOD SED  06/07/2019  . IR RADIOLOGIST EVAL & MGMT  05/26/2019  . KNEE ARTHROSCOPY  2008   Right  . KNEE ARTHROSCOPY  2008   Left  . POLYPECTOMY    . [place] THORA/PARACENTESIS  10/28/2011  . Varicocele repair  1980's     reports that he has been smoking cigarettes and e-cigarettes. He has been smoking about 0.50 packs per day. He has never used smokeless tobacco. He reports previous alcohol use. He reports current drug use. Drug: Marijuana.  Allergies  Allergen Reactions  . Yellow  Jacket Venom Anaphylaxis, Shortness Of Breath and Other (See Comments)    Throat closes up/eye swell shut  . Nsaids Other (See Comments)    Stomach pain, Bleeding risk    Family History  Problem Relation Age of Onset  . Stroke Mother   . Diabetes Mother   . Lung cancer Brother   . Prostate cancer Brother   . Liver cancer Cousin   . Liver cancer Paternal Uncle   . Colon cancer Neg Hx   . Stomach cancer Neg Hx   . Esophageal cancer Neg Hx   . Colon polyps Neg Hx   . Rectal cancer Neg Hx   . Pancreatic  cancer Neg Hx      Prior to Admission medications   Medication Sig Start Date End Date Taking? Authorizing Provider  buPROPion (WELLBUTRIN SR) 150 MG 12 hr tablet TAKE 1 TABLET BY MOUTH EVERY DAY 06/01/19  Yes Viviana Simpler I, MD  ferrous sulfate 325 (65 FE) MG EC tablet Take 325 mg by mouth every other day.   Yes [provider]  fluocinonide cream (LIDEX) [id number] % Apply 1 application topically 2 (two) times daily as needed (for skin rash/irritation). APPLY ON THE SKIN TWICE A DAY AS NEEDED FOR RASH 12/30/18  Yes [name], MD  HYDROcodone-acetaminophen (NORCO/VICODIN) 5-325 MG tablet Take 1 tablet by mouth 3 (three) times daily as needed (for pain.). 05/27/19  Yes [name], MD  lactulose (CHRONULAC) 10 GM/15ML solution Take 30 mLs (20 g total) by mouth daily. Please keep 3-4 bowel movement daily, please call your GI doctor if your bm less than three times a day or more than 4 times a day. Your GI doctor will help you adjust lactulose dose. 06/09/19  Yes Florencia Reasons, MD  mirtazapine (REMERON) 45 MG tablet TAKE 1 TABLET (45 MG TOTAL) BY MOUTH AT BEDTIME. 05/12/19  Yes [name], MD  Multiple Vitamin (MULTIVITAMIN WITH MINERALS) TABS Take 1 tablet by mouth daily.    Yes [provider]  pantoprazole (PROTONIX) 40 MG tablet Take 1 tablet (40 mg total) by mouth 2 (two) times daily before a meal. 06/09/19 07/09/19 Yes Florencia Reasons, MD  silver sulfADIAZINE (SILVADENE) 1 % cream Apply topically 2 (two) times daily. To left thigh 06/09/19  Yes Florencia Reasons, MD  vitamin C (ASCORBIC ACID) 250 MG tablet Take 250 mg by mouth daily.    Yes [provider]  sucralfate (CARAFATE) 1 GM/10ML suspension Take 10 mLs (1 g total) by mouth 3 (three) times daily between meals for 7 days. 05/31/19 06/07/19  Harold Hedge, MD    Physical Exam: Vitals:   06/17/19 1836  BP: 133/64  Pulse: 95  Resp: 16  Temp: 97.6 F (36.4 C)  TempSrc: Oral  SpO2: 100%  Weight: 75.3 kg   Height: 6\' 3"  (1.905 m)    Constitutional:  . Appears calm and comfortable.  Cachectic. Eyes:  . Pallor. No jaundice.  ENMT:  . external ears, nose appear normal Neck:  . Neck is supple. No JVD Respiratory:  . CTA bilaterally, no w/r/r.  . Respiratory effort normal. No retractions or accessory muscle use Cardiovascular:  . S1S2 . Bilateral LE extremity edema   Abdomen:  . Abdomen is distended, soft and non tender.  Ascites is difficult to assess.  Organs are difficult to assess. Neurologic:  . Awake and alert. . Moves all limbs.  Wt Readings from Last 3 Encounters:  06/17/19 75.3 kg  06/07/19 78.3  kg  05/30/19 75 kg    I have personally reviewed following labs and imaging studies  Labs on Admission:  CBC: Recent Labs  Lab 06/14/19 1312  WBC 8.0  HGB 8.4*  HCT 27.2*  MCV 96.2  PLT 0000000   Basic Metabolic Panel: Recent Labs  Lab 06/14/19 1312 06/16/19 1413  NA 129* 129*  K 4.4 3.7  CL 101 102  CO2 19 18*  GLUCOSE 129* 108*  BUN 38* 46*  CREATININE 2.52* 2.52*  CALCIUM 8.4 8.2*   Liver Function Tests: Recent Labs  Lab 06/14/19 1312  AST 28  ALT 22  ALKPHOS 107  BILITOT 0.6  PROT 5.4*  ALBUMIN 2.3*   No results for input(s): LIPASE, AMYLASE in the last 168 hours. No results for input(s): AMMONIA in the last 168 hours. Coagulation Profile: Recent Labs  Lab 06/14/19 1312  INR 1.3*   Cardiac Enzymes: No results for input(s): CKTOTAL, CKMB, CKMBINDEX, TROPONINI in the last 168 hours. BNP (last 3 results) No results for input(s): PROBNP in the last 8760 hours. HbA1C: No results for input(s): HGBA1C in the last 72 hours. CBG: No results for input(s): GLUCAP in the last 168 hours. Lipid Profile: No results for input(s): CHOL, HDL, LDLCALC, TRIG, CHOLHDL, LDLDIRECT in the last 72 hours. Thyroid Function Tests: No results for input(s): TSH, T4TOTAL, FREET4, T3FREE, THYROIDAB in the last 72 hours. Anemia Panel: Recent Labs    06/16/19 1413   FERRITIN 398.1*   Urine analysis:    Component Value Date/Time   COLORURINE YELLOW 06/09/2019 [place] 06/09/2019 1221   LABSPEC 1.024 06/09/2019 1221   PHURINE 5.0 06/09/2019 1221   GLUCOSEU NEGATIVE 06/09/2019 1221   HGBUR NEGATIVE 06/09/2019 1221   BILIRUBINUR NEGATIVE 06/09/2019 [place] 06/09/2019 1221   PROTEINUR NEGATIVE 06/09/2019 1221   UROBILINOGEN 0.2 01/06/2012 0855   NITRITE NEGATIVE 06/09/2019 1221   LEUKOCYTESUR NEGATIVE 06/09/2019 1221   Sepsis Labs: @LABRCNTIP (procalcitonin:4,lacticidven:4) )No results found for this or any previous visit (from the past 240 hour(s)).    Radiological Exams on Admission: No results found.  Active Problems:   Acute on chronic renal failure (HCC)   Assessment/Plan Acute kidney injury on chronic kidney disease stage III/concerns for possible hepatorenal syndrome type II: -The is a direct admit from GI provider's office -Admit patient as planned -Consult nephrology -Work-up AKI -Strict urinary input and output -IV albumin 1 g/kg, maximum of 100 g daily for 2 days, then decrease to 50 g daily until discontinued. -Octreotide drip 50 MCG per hour -Midodrine 7.5 mg p.o. 3 times daily -Monitor renal function and electrolytes. -Avoid diuretics -Further management will depend on hospital course.  Chronic liver disease: -Stable for now. -Manage expectantly. -Could not easily elicit presence of ascites.  History of DVT: Patient is off anticoagulation due to bleeding complications SCD for DVT prophylaxis  Prior history of GI bleed: Stable for now. No bleeding reported.  History of alcohol abuse: Patient denies current use of alcohol Monitor closely.  DVT prophylaxis: Code Status: Full code Family Communication:  Disposition Plan: Will depend on hospital course Consults called: Nephrology.  Also consulted palliative care team. Admission status: Inpatient  Time spent: 65 minutes   Dana Allan, MD  [facility] Pager #: [phone] 7PM-7AM contact night coverage as above  06/17/2019, 9:57 PM

## 2019-06-17 NOTE — Telephone Encounter (Signed)
I spoke to the patient and his wife about his hepatorenal syndrome and increasing creatinine from 1.4-2.5.  We rechecked it and it still 2.5.  Earlier in the week I spoke to nephrology, Dr. 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  This requires hospitalization.  I also spoke to Dr.Matthews of the hospitalist service who is the admitting physician for [facility] long today.  She has graciously agreed to accept the patient for a direct admission assuming we can get a bed.   My office will ask for a bed to admit the patient to Dr.Elizabeth Zigmund Daniel of [facility] for hepatorenal syndrome.  My thoughts on this and requests are as follows.  Consult nephrology to try treatment for hepatorenal syndrome.  Octreotide midodrine albumin If this fails to help the patient discharged to home after 2 to 3 days or what ever renal says tells [place] of is going to work or not. I am asking for renal help because of the difficulties in vol management here and advice re: potential paracentesis  Consult palliative care.  I have started discussions about end-of-life decisions.  Not specifically but generalized.  He has major issues with scrotal swelling, paracentesis may help but this is tricky with his kidney failure problems and hepatorenal syndrome and major fluid shifts can make that worse.  If we could get his kidney function better he could then potentially have a TIPS and he has seen IR already.  His meld score is still too high for that.  We were trying for this but his GI bleed and HRS got in the way.  I appreciate everyone's help

## 2019-06-17 NOTE — Telephone Encounter (Signed)
I spoke with bed control and there a re 5 patients in front of him. Patient notified that he will be contacted directly when a bed is available.

## 2019-06-18 ENCOUNTER — Other Ambulatory Visit: Payer: Self-pay

## 2019-06-18 DIAGNOSIS — Z515 Encounter for palliative care: Secondary | ICD-10-CM

## 2019-06-18 DIAGNOSIS — Z7189 Other specified counseling: Secondary | ICD-10-CM

## 2019-06-18 DIAGNOSIS — K709 Alcoholic liver disease, unspecified: Secondary | ICD-10-CM

## 2019-06-18 LAB — CBC
HCT: 26.5 % — ABNORMAL LOW (ref 39.0–52.0)
Hemoglobin: 7.9 g/dL — ABNORMAL LOW (ref 13.0–17.0)
MCH: 29.2 pg (ref 26.0–34.0)
MCHC: 29.8 g/dL — ABNORMAL LOW (ref 30.0–36.0)
MCV: 97.8 fL (ref 80.0–100.0)
Platelets: 98 10*3/uL — ABNORMAL LOW (ref 150–400)
RBC: 2.71 MIL/uL — ABNORMAL LOW (ref 4.22–5.81)
RDW: 18.8 % — ABNORMAL HIGH (ref 11.5–15.5)
WBC: 5.9 10*3/uL (ref 4.0–10.5)
nRBC: 0 % (ref 0.0–0.2)

## 2019-06-18 LAB — BASIC METABOLIC PANEL
Anion gap: 9 (ref 5–15)
BUN: 53 mg/dL — ABNORMAL HIGH (ref 8–23)
CO2: 20 mmol/L — ABNORMAL LOW (ref 22–32)
Calcium: 8.1 mg/dL — ABNORMAL LOW (ref 8.9–10.3)
Chloride: 100 mmol/L (ref 98–111)
Creatinine, Ser: 2.84 mg/dL — ABNORMAL HIGH (ref 0.61–1.24)
GFR calc Af Amer: 25 mL/min — ABNORMAL LOW (ref >60)
GFR calc non Af Amer: 21 mL/min — ABNORMAL LOW (ref >60)
Glucose, Bld: 127 mg/dL — ABNORMAL HIGH (ref 70–99)
Potassium: 4.1 mmol/L (ref 3.5–5.1)
Sodium: 129 mmol/L — ABNORMAL LOW (ref 135–145)

## 2019-06-18 LAB — URINALYSIS, COMPLETE (UACMP) WITH MICROSCOPIC
Bacteria, UA: NONE SEEN
Bilirubin Urine: NEGATIVE
Glucose, UA: NEGATIVE mg/dL
Hgb urine dipstick: NEGATIVE
Ketones, ur: NEGATIVE mg/dL
Leukocytes,Ua: NEGATIVE
Nitrite: NEGATIVE
Protein, ur: NEGATIVE mg/dL
Specific Gravity, Urine: 1.018 (ref 1.005–1.030)
pH: 5 (ref 5.0–8.0)

## 2019-06-18 LAB — SODIUM, URINE, RANDOM: Sodium, Ur: <10 mmol/L

## 2019-06-18 LAB — SARS CORONAVIRUS 2 (TAT 6-24 HRS): SARS Coronavirus 2: NEGATIVE

## 2019-06-18 MED ORDER — ALBUMIN HUMAN 25 % IV SOLN
50.0000 g | Freq: Every day | INTRAVENOUS | Status: DC
  Administered 2019-06-19 – 2019-06-23 (×5): 50 g via INTRAVENOUS
  Filled 2019-06-18 (×5): qty 200

## 2019-06-18 MED ORDER — FUROSEMIDE 10 MG/ML IJ SOLN
80.0000 mg | Freq: Two times a day (BID) | INTRAMUSCULAR | Status: DC
  Administered 2019-06-18 – 2019-06-21 (×7): 80 mg via INTRAVENOUS
  Filled 2019-06-18 (×7): qty 8

## 2019-06-18 MED ORDER — SODIUM BICARBONATE 650 MG PO TABS
650.0000 mg | ORAL_TABLET | Freq: Two times a day (BID) | ORAL | Status: DC
  Administered 2019-06-18 (×2): 650 mg via ORAL
  Filled 2019-06-18 (×2): qty 1

## 2019-06-18 NOTE — Progress Notes (Signed)
PROGRESS NOTE    Billy [name]  [id number] DOB: 12/08/1945 DOA: 06/17/2019 PCP: [name], MD   Brief Narrative:  73 year old gentleman prior history of liver cirrhosis, right lower extremity DVT not on anticoagulation due to GI bleed, long standing history of alcohol use disorder, GERD, chronic diastolic heart failure history of peptic ulcer disease, gastritis, hypertension, thrombocytopenia, esophageal varices was admitted for evaluation of acute kidney injury and possible hepatorenal syndrome.  Patient currently denies any chest pain shortness of breath, nausea vomiting .  Assessment & Plan:   Active Problems:   Acute on chronic renal failure (HCC)   Liver cirrhosis with ascites and esophageal varices.  He appears to be fluid overloaded. Started him on IV lasix 80 mg BID.  Monitor his electrolytes.  Continue with lactulose to prevent encephalopathy.      Acute on stage 3 CKD; ? hepato renal syndrome vs ATN vs FLUID overload.  Baseline creatinine around 1.5 , on admission at 2.5, worsened to 2.8 today.  Ruled out UTI. US renal does not show any hydronephrosis.  Appreciate nephrology recommendations.  Started the patient on octreotide and albumin . Repeat renal parameters in am.     GERD/ PUD/ esophageal varices:  Pt denies any hematemesis or hematochezia: Continue with PPI AND sucralfate.    Right LE DVT:  NOT on anti coagulation due to GI bleed.     Anemia of chronic disease:  Hemoglobin around 7.9.  Pt denies any tarry stools or rectal bleeding.  Transfuse to keep hemoglobin greater than 7.    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    Hyponatremia:  Possibly from fluid overload from liver cirrhosis.  Diuresis and repeat bmp in am.   In view of the multiple medical problems. Worsening renal parameters , palliative care consulted for goals of care.    DVT prophylaxis: scd's Code Status: full code.  Family Communication:  none at bedside.  Disposition Plan: pending clinical improvement.    Consultants:   Nephrology.   Procedures: none.   Antimicrobials: none.    Subjective: No new complaints.   Objective: Vitals:   06/17/19 1836 06/17/19 2158 06/18/19 0620 06/18/19 1338  BP: 133/64 129/73 127/69 122/62  Pulse: 95 93 83 86  Resp: 16 20 16 16   Temp: 97.6 F (36.4 C) 97.8 F (36.6 C) 98.3 F (36.8 C) 97.7 F (36.5 C)  TempSrc: Oral Oral Oral Oral  SpO2: 100% 100% 100% 100%  Weight: 75.3 kg  76.4 kg   Height: 6\' 3"  (1.905 m)       Intake/Output Summary (Last 24 hours) at 06/18/2019 1345 Last data filed at 06/18/2019 0940 Gross per 24 hour  Intake 1144.34 ml  Output 200 ml  Net 944.34 ml   Filed Weights   06/17/19 1836 06/18/19 0620  Weight: 75.3 kg 76.4 kg    Examination:  General exam: Appears calm and comfortable  Respiratory system: Clear to auscultation. Respiratory effort normal. Cardiovascular system: S1 & S2 heard, RRR. No pedal edema. Gastrointestinal system: Abdomen is soft, mildly tender, distended, bowel sounds wnl.  Central nervous system: Alert and oriented. No focal neurological deficits. Extremities: Symmetric 5 x 5 power. Skin: No rashes, lesions or ulcers Psychiatry:  Mood & affect appropriate.     Data Reviewed: I have personally reviewed following labs and imaging studies  CBC: Recent Labs  Lab 06/14/19 1312 06/18/19 0534  WBC 8.0 5.9  HGB 8.4* 7.9*  HCT 27.2* 26.5*  MCV 96.2 97.8  PLT 179.0 98*   Basic Metabolic Panel: Recent Labs  Lab 06/14/19 1312 06/16/19 1413 06/17/19 2234 06/18/19 0534  NA 129* 129* 129* 129*  K 4.4 3.7 4.0 4.1  CL 101 102 101 100  CO2 19 18* 17* 20*  GLUCOSE 129* 108* 102* 127*  BUN 38* 46* 48* 53*  CREATININE 2.52* 2.52* 2.67* 2.84*  CALCIUM 8.4 8.2* 8.0* 8.1*  MG  --   --  1.9  --   PHOS  --   --  4.5  --    GFR: Estimated Creatinine Clearance: 25.8 mL/min (A) (by C-G formula based on SCr of 2.84 mg/dL  (H)). Liver Function Tests: Recent Labs  Lab 06/14/19 1312 06/17/19 2234  AST 28 36  ALT 22 27  ALKPHOS 107 112  BILITOT 0.6 0.8  PROT 5.4* 5.5*  ALBUMIN 2.3* 2.2*   No results for input(s): LIPASE, AMYLASE in the last 168 hours. No results for input(s): AMMONIA in the last 168 hours. Coagulation Profile: Recent Labs  Lab 06/14/19 1312  INR 1.3*   Cardiac Enzymes: No results for input(s): CKTOTAL, CKMB, CKMBINDEX, TROPONINI in the last 168 hours. BNP (last 3 results) No results for input(s): PROBNP in the last 8760 hours. HbA1C: No results for input(s): HGBA1C in the last 72 hours. CBG: No results for input(s): GLUCAP in the last 168 hours. Lipid Profile: No results for input(s): CHOL, HDL, LDLCALC, TRIG, CHOLHDL, LDLDIRECT in the last 72 hours. Thyroid Function Tests: Recent Labs    06/17/19 2234  TSH 6.511*   Anemia Panel: Recent Labs    06/16/19 1413  FERRITIN 398.1*   Sepsis Labs: No results for input(s): PROCALCITON, LATICACIDVEN in the last 168 hours.  Recent Results (from the past 240 hour(s))  SARS CORONAVIRUS 2 (TAT 6-24 HRS) Nasopharyngeal Nasopharyngeal Swab     Status: None   Collection Time: 06/18/19  6:06 AM   Specimen: Nasopharyngeal Swab  Result Value Ref Range Status   SARS Coronavirus 2 NEGATIVE NEGATIVE Final    Comment: (NOTE) SARS-CoV-2 target nucleic acids are NOT DETECTED. The SARS-CoV-2 RNA is generally detectable in upper and lower respiratory specimens during the acute phase of infection. Negative results do not preclude SARS-CoV-2 infection, do not rule out co-infections with other pathogens, and should not be used as the sole basis for treatment or other patient management decisions. Negative results must be combined with clinical observations, patient history, and epidemiological information. The expected result is Negative. Fact Sheet for Patients: SugarRoll.be Fact Sheet for Healthcare  Providers: https://www.woods-mathews.com/ This test is not yet approved or cleared by the [place] FDA and  has been authorized for detection and/or diagnosis of SARS-CoV-2 by FDA under an Emergency Use Authorization (EUA). This EUA will remain  in effect (meaning this test can be used) for the duration of the COVID-19 declaration under Section 56 4(b)(1) of the Act, 21 U.S.C. section 360bbb-3(b)(1), unless the authorization is terminated or revoked sooner. Performed at [facility], [place] [address] [place], [place] [address]          Radiology Studies: US Renal  Result Date: 06/17/2019 CLINICAL DATA:  Acute kidney injury EXAM: RENAL / URINARY TRACT ULTRASOUND COMPLETE COMPARISON:  06/08/2019 FINDINGS: Right Kidney: Renal measurements: 9.9 x 4.5 x 5.2 cm = volume: 119.1 mL. Cortex is echogenic. Negative for hydronephrosis or mass Left Kidney: Renal measurements: 9 x 5 x 4.7 cm = volume: 107.8 mL. Cortex is echogenic. Negative for hydronephrosis or mass. Bladder: Appears normal  for degree of bladder distention. Other: Cirrhotic morphology of the liver. Moderate ascites within the abdomen and pelvis. IMPRESSION: 1. Echogenic kidneys consistent with medical renal disease. No hydronephrosis 2. Cirrhosis of the liver with moderate ascites Electronically Signed   By: Donavan Foil M.D.   On: 06/17/2019 22:42        Scheduled Meds: . buPROPion  150 mg Oral Daily  . ferrous sulfate  325 mg Oral QODAY  . furosemide  80 mg Intravenous BID  . lactulose  20 g Oral Daily  . midodrine  7.5 mg Oral TID WC  . mirtazapine  45 mg Oral QHS  . multivitamin with minerals  1 tablet Oral Daily  . pantoprazole  40 mg Oral BID AC  . sodium bicarbonate  650 mg Oral BID  . sucralfate  1 g Oral TID BM  . vitamin C  250 mg Oral Daily   Continuous Infusions: . [START ON 06/19/2019] albumin human    . octreotide  (SANDOSTATIN)    IV infusion 50 mcg/hr (06/18/19 1027)     LOS: 1  day        Hosie Poisson, MD [facility] 06/18/2019, 1:45 PM

## 2019-06-18 NOTE — TOC Initial Note (Signed)
Transition of Care [facility]) - Initial/Assessment Note    Patient Details  Name: ADEKUNLE MEECE MRN: [id number] Date of Birth: [birth year]  Transition of [facility]) CM/SW Contact:    Lynnell Catalan, RN Phone Number: 06/18/2019, 1:26 PM   Expected Discharge Plan: Home/Self Care Barriers to Discharge: Continued Medical Work up   Expected Discharge Plan and Services Expected Discharge Plan: Home/Self Care       Living arrangements for the past 2 months: [place]                   Prior Living Arrangements/Services Living arrangements for the past 2 months: Single Family Home Lives with:: Spouse                   Activities of Daily Living Home Assistive Devices/Equipment: Eyeglasses ADL Screening (condition at time of admission) Patient's cognitive ability adequate to safely complete daily activities?: Yes Is the patient deaf or have difficulty hearing?: Yes(slight hoh) Does the patient have difficulty seeing, even when wearing glasses/contacts?: No Does the patient have difficulty concentrating, remembering, or making decisions?: No Patient able to express need for assistance with ADLs?: Yes Does the patient have difficulty dressing or bathing?: No Independently performs ADLs?: Yes (appropriate for developmental age) Communication: Independent Dressing (OT): Needs assistance Is this a change from baseline?: Change from baseline, expected to last >3 days Grooming: Needs assistance Is this a change from baseline?: Change from baseline, expected to last >3 days Feeding: Needs assistance Is this a change from baseline?: Change from baseline, expected to last >3 days Bathing: Needs assistance Is this a change from baseline?: Change from baseline, expected to last >3 days Toileting: Needs assistance(per wife, patient incontinent since yesterday-new problem for patient) Is this a change from baseline?: Change from baseline, expected to last >3days In/Out Bed:  Needs assistance Is this a change from baseline?: Change from baseline, expected to last >3 days Walks in Home: Needs assistance Is this a change from baseline?: Change from baseline, expected to last >3 days Does the patient have difficulty walking or climbing stairs?: Yes(secondary to weakness) Weakness of Legs: Both Weakness of Arms/Hands: None     Admission diagnosis:  hepatorenal syndrome Patient Active Problem List   Diagnosis Date Noted  . Acute on chronic renal failure ([place]) 06/17/2019  . Hepatorenal syndrome (HCC)-Type 2 06/14/2019  . Melena   . Rectal bleeding   . GI bleed 05/29/2019  . AMS (altered mental status) 05/28/2019  . Light headed 05/10/2019  . Chronic renal disease, stage III 04/02/2019  . Gastroesophageal reflux disease with esophagitis   . Acute deep vein thrombosis (DVT) of femoral vein of right lower extremity ([place]) 03/30/2019  . Right leg DVT ([place])   . Narcotic dependence ([place]) 05/19/2017  . Secondary esophageal varices with bleeding ([place])   . Leg pain, left 04/24/2016  . Ascites 11/20/2015  . Scrotal edema 11/20/2015  . Hydrocele in adult 11/20/2015  . Alcohol dependence ([place]) 09/26/2014  . Thrombocytopenia ([place]) 09/26/2014  . Routine general medical examination at a health care facility 09/16/2012  . Alcoholic cirrhosis of liver ([place]) 09/24/2011  . Portal hypertension ([place]) 09/11/2011  . Angiodysplasia of colon 09/11/2011  . 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 Iron deficiency anemia due to chronic blood loss 09/05/2011  . Personal history of colonic polyps 09/05/2011  . DEGENERATIVE DISC DISEASE, LUMBAR SPINE 10/12/2007  . SLEEP DISORDER 10/12/2007  . Depression 08/06/2006  . Essential hypertension, benign 08/06/2006  .  Portal hypertensive gastropathy ([place]) 08/06/2006  . DIVERTICULOSIS, COLON 08/06/2006  . Chronic back pain greater than 3 months duration 08/06/2006   PCP:  [name], MD Pharmacy:   [id number] [place], [address] [place] [address] Phone: [phone] Fax: [phone]     Social Determinants of Health (SDOH) Interventions    Readmission Risk Interventions Readmission Risk Prevention Plan 06/18/2019 06/07/2019  Transportation Screening Complete Complete  PCP or Specialist Appt within 3-5 Days Complete Complete  Not Complete comments Not ready for dc pt established with providers  [place] or [place] Complete Complete  Social Work Consult for [employer] Planning/Counseling Not Complete Complete  SW consult not completed comments NA -  Palliative Care Screening Not Applicable Not Complete  Palliative Care Screening Not Complete Comments NA pending need  Medication Review (RN Care Manager) Complete Referral to Pharmacy  Some recent data might be hidden

## 2019-06-18 NOTE — Consult Note (Signed)
[facility] Renal Consultation Note  Requesting MD:  Indication for Consultation: Acute kidney injury, maintenance of euvolemia, assessment and treatment of electrolyte and acid-base abnormalities.  HPI: This is a 73 year old gentleman with a history of liver cirrhosis complicated by scrotal edema GI bleed alcohol abuse history of DVT but no longer able to tolerate anticoagulation therapy.  Is a history of chronic kidney disease and it appears that his baseline creatinine has oscillated around 1.4 to 1.8 mg/dL urine output 200 cc 12/4.  Did receive a CT scan with contrast administered 06/07/2019.  Last paracentesis was performed 05/30/2019 with removal of 3.2 L.  Home medications BuSpar 150 mg daily, iron 325 mg daily, Norco/acetaminophen 1 tablet 3 times daily, lactulose 30 cc daily, Remeron 45 mg daily, multivitamins 1 daily, Protonix 40 mg twice daily.  Blood pressure 127/69 pulse 83 temperature 98.3 O2 sats 100% room air  Sodium 129 potassium 4.1 chloride 100 CO2 20 BUN 53 creatinine 2.84 glucose 127 calcium 8.1 phosphorus 4.5 magnesium 1.9 albumin 2.2 WBC 5.9 hemoglobin 7.9 platelets 98.  Lactulose 20 g daily, Wellbutrin 150 mg daily, iron 325 mg daily, Remeron 45 mg daily multivitamins 1 daily Protonix 40 mg daily Carafate 1 g 3 times daily, octreotide 50 micrograms an hour, albumin 75 g daily  Creatinine, Ser  Date/Time Value Ref Range Status  06/18/2019 05:34 AM 2.84 (H) 0.61 - 1.24 mg/dL Final  06/17/2019 10:34 PM 2.67 (H) 0.61 - 1.24 mg/dL Final  06/16/2019 02:13 PM 2.52 (H) 0.40 - 1.50 mg/dL Final  06/14/2019 01:12 PM 2.52 (H) 0.40 - 1.50 mg/dL Final  06/09/2019 02:19 AM 1.44 (H) 0.61 - 1.24 mg/dL Final  06/08/2019 08:10 AM 1.56 (H) 0.61 - 1.24 mg/dL Final  06/06/2019 01:57 AM 1.85 (H) 0.61 - 1.24 mg/dL Final  06/05/2019 01:59 AM 1.75 (H) 0.61 - 1.24 mg/dL Final  06/04/2019 11:50 AM 1.89 (H) 0.61 - 1.24 mg/dL Final  06/03/2019 12:10 PM 1.50 0.40 - 1.50 mg/dL Final   05/31/2019 04:53 AM 1.59 (H) 0.61 - 1.24 mg/dL Final  05/30/2019 05:24 AM 1.61 (H) 0.61 - 1.24 mg/dL Final  05/29/2019 06:23 PM 1.88 (H) 0.61 - 1.24 mg/dL Final  05/29/2019 05:53 AM 2.13 (H) 0.61 - 1.24 mg/dL Final  05/28/2019 12:50 PM 2.51 (H) 0.61 - 1.24 mg/dL Final  05/21/2019 03:12 PM 1.79 (H) 0.40 - 1.50 mg/dL Final  05/10/2019 11:36 AM 1.77 (H) 0.40 - 1.50 mg/dL Final  04/02/2019 11:08 AM 1.30 0.40 - 1.50 mg/dL Final  03/31/2019 04:41 AM 1.25 (H) 0.61 - 1.24 mg/dL Final  03/30/2019 10:50 AM 1.77 (H) 0.61 - 1.24 mg/dL Final  03/10/2019 11:49 AM 1.30 0.40 - 1.50 mg/dL Final  02/01/2019 12:39 PM 1.45 0.40 - 1.50 mg/dL Final  01/01/2019 12:31 PM 1.08 0.40 - 1.50 mg/dL Final  06/25/2018 12:50 PM 1.14 0.40 - 1.50 mg/dL Final  05/28/2018 02:03 PM 0.96 0.40 - 1.50 mg/dL Final  04/07/2018 02:29 PM 0.99 0.40 - 1.50 mg/dL Final  01/13/2018 03:10 PM 1.10 0.61 - 1.24 mg/dL Final  08/11/2017 02:59 PM 1.00 0.40 - 1.50 mg/dL Final  06/16/2017 03:55 PM 0.82 0.40 - 1.50 mg/dL Final  04/18/2017 11:46 AM 0.78 0.40 - 1.50 mg/dL Final  04/16/2017 03:31 AM 0.65 0.61 - 1.24 mg/dL Final  04/15/2017 03:26 AM 0.76 0.61 - 1.24 mg/dL Final  04/14/2017 12:33 AM 0.85 0.61 - 1.24 mg/dL Final  04/13/2017 05:31 AM 0.92 0.61 - 1.24 mg/dL Final  01/17/2017 02:07 PM 0.81 0.40 - 1.50 mg/dL Final  11/25/2016 12:02 PM 0.80 0.40 - 1.50 mg/dL Final  05/27/2016 04:17 PM 0.80 0.40 - 1.50 mg/dL Final  11/28/2015 10:24 AM 0.78 0.40 - 1.50 mg/dL Final  10/18/2015 10:17 AM 0.65 0.40 - 1.50 mg/dL Final  03/08/2015 10:13 AM 0.68 0.40 - 1.50 mg/dL Final  09/26/2014 03:27 PM 0.80 0.40 - 1.50 mg/dL Final  05/13/2014 11:32 AM 0.8 0.4 - 1.5 mg/dL Final  09/17/2013 12:27 PM 0.8 0.4 - 1.5 mg/dL Final  12/23/2012 10:21 AM 0.78 0.50 - 1.35 mg/dL Final  10/16/2012 11:52 AM 0.7 0.4 - 1.5 mg/dL Final  05/05/2012 10:11 AM 0.7 0.4 - 1.5 mg/dL Final  01/27/2012 11:08 AM 0.8 0.4 - 1.5 mg/dL Final  01/10/2012 04:53 AM 0.56 0.50 - 1.35  mg/dL Final  01/09/2012 05:00 AM 0.56 0.50 - 1.35 mg/dL Final  01/08/2012 04:48 AM 0.54 0.50 - 1.35 mg/dL Final  01/07/2012 01:52 AM 0.52 0.50 - 1.35 mg/dL Final  01/06/2012 08:47 AM 0.56 0.50 - 1.35 mg/dL Final     PMHx:   Past Medical History:  Diagnosis Date  . Acute deep vein thrombosis (DVT) of femoral vein of right lower extremity ([place]) 03/30/2019  . Acute posthemorrhagic anemia   . Alcoholic cirrhosis of liver ([place]) 09/24/2011   Years of alcohol use. Progressed from fatty liver to cirrhosis based upon review imaging. Manifestations are portal hypertension with esophageal varices, portal gastropathy, rectal varices and he may have portal colopathy. Ascites also present. Naive to HAV and HBV Claims he has had pneumococcal vaccine   . Alcoholism ([place])   . Angiodysplasia of colon 09/11/2011   Multiple in the right colon, ablated with APC. Question if this is portal colopathy.   . Anxiety   . Back pain   . Basal cell carcinoma   . Bilateral varicoceles   . Campylobacter diarrhea 01/07/2012  . Cataract    removed  . DDD (degenerative disc disease), lumbar   . Depression    mild at most  . Diverticulosis of colon   . Erectile dysfunction   . Gastric ulcer 08/2011   small, antral ulcer  . GERD (gastroesophageal reflux disease)    takes Omeprazole prn  . GI hemorrhage 2006, 2013   esophagitis and 1+ varices, melena in 2013   . Grade I diastolic dysfunction   . Hearing loss   . Hepatorenal syndrome (HCC)-Type 2 06/14/2019  . History of ascites   . History of blood transfusion    as a child and no abnormal reaction noted  . History of bronchitis    >66yrs ago  . History of dizziness   . Hydrocele   . Hypertension    takes Nadolol daily  . Narcotic dependence ([place])   . Personal history of colonic polyps   . Portal hypertension ([place]) 08/2011  . 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 Right hydrocele   . Scrotal edema   . Sleep disturbance    takes Remeron nightly  . Splenomegaly    . Thrombocytopenia, unspecified ([place])   . 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 BIOPSY  08/27/2018   Procedure: BIOPSY;  Surgeon: Milus Banister, MD;  Location: [facility];  Service: Endoscopy;;  . CATARACT EXTRACTION  2013   bilateral with IOL  . CHOLECYSTECTOMY  12/31/2012   Procedure: LAPAROSCOPIC CHOLECYSTECTOMY;  Surgeon: Zenovia Jarred, MD;  Location: [facility];  Service: General;;  . COLONOSCOPY  2001, 200411/02/2006  . COLONOSCOPY  09/11/2011   Procedure: COLONOSCOPY;  Surgeon: Gatha Mayer, MD;  [facility];  Service: Endoscopy;  Laterality: N/A;  . COLONOSCOPY    . COLONOSCOPY WITH PROPOFOL N/A 06/07/2019   Procedure: COLONOSCOPY WITH PROPOFOL;  Surgeon: Ladene Artist, MD;  [facility]: [facility];  Service: Endoscopy;  Laterality: N/A;  . ESOPHAGEAL BANDING N/A 05/02/2017   Procedure: ESOPHAGEAL BANDING;  Surgeon: Gatha Mayer, MD;  [facility];  Service: Endoscopy;  Laterality: N/A;  . ESOPHAGEAL BANDING  06/09/2018   Procedure: ESOPHAGEAL BANDING;  Surgeon: Gatha Mayer, MD;  [facility];  Service: Endoscopy;;  . ESOPHAGEAL BANDING  08/27/2018   Procedure: ESOPHAGEAL BANDING;  Surgeon: Milus Banister, MD;  [facility];  Service: Endoscopy;;  . ESOPHAGOGASTRODUODENOSCOPY  2006  . ESOPHAGOGASTRODUODENOSCOPY  09/11/2011   Procedure: ESOPHAGOGASTRODUODENOSCOPY (EGD);  Surgeon: Gatha Mayer, MD;  [facility];  Service: Endoscopy;  Laterality: N/A;  . ESOPHAGOGASTRODUODENOSCOPY N/A 08/27/2018   Procedure: ESOPHAGOGASTRODUODENOSCOPY (EGD);  Surgeon: Milus Banister, MD;  [facility];  Service: Endoscopy;  Laterality: N/A;  . ESOPHAGOGASTRODUODENOSCOPY (EGD) WITH PROPOFOL N/A 04/13/2017   Procedure: ESOPHAGOGASTRODUODENOSCOPY (EGD) WITH PROPOFOL;  Surgeon: Doran Stabler, MD;  [facility];  Service:  Gastroenterology;  Laterality: N/A;  . ESOPHAGOGASTRODUODENOSCOPY (EGD) WITH PROPOFOL N/A 05/02/2017   Procedure: ESOPHAGOGASTRODUODENOSCOPY (EGD) WITH PROPOFOL;  Surgeon: Gatha Mayer, MD;  [facility];  Service: Endoscopy;  Laterality: N/A;  . ESOPHAGOGASTRODUODENOSCOPY (EGD) WITH PROPOFOL N/A 09/30/2017   Procedure: ESOPHAGOGASTRODUODENOSCOPY (EGD) WITH PROPOFOL;  Surgeon: Gatha Mayer, MD;  [facility];  Service: Endoscopy;  Laterality: N/A;  . ESOPHAGOGASTRODUODENOSCOPY (EGD) WITH PROPOFOL N/A 06/09/2018   Procedure: ESOPHAGOGASTRODUODENOSCOPY (EGD) WITH PROPOFOL;  Surgeon: Gatha Mayer, MD;  [facility];  Service: Endoscopy;  Laterality: N/A;  . ESOPHAGOGASTRODUODENOSCOPY (EGD) WITH PROPOFOL N/A 01/05/2019   Procedure: ESOPHAGOGASTRODUODENOSCOPY (EGD) WITH PROPOFOL;  Surgeon: Gatha Mayer, MD;  [facility];  Service: Endoscopy;  Laterality: N/A;  . ESOPHAGOGASTRODUODENOSCOPY (EGD) WITH PROPOFOL N/A 05/30/2019   Procedure: ESOPHAGOGASTRODUODENOSCOPY (EGD) WITH PROPOFOL;  Surgeon: Carol Ada, MD;  [facility];  Service: Endoscopy;  Laterality: N/A;  . ESOPHAGOGASTRODUODENOSCOPY (EGD) WITH PROPOFOL N/A 06/07/2019   Procedure: ESOPHAGOGASTRODUODENOSCOPY (EGD) WITH PROPOFOL;  Surgeon: Ladene Artist, MD;  [facility];  Service: Endoscopy;  Laterality: N/A;  . EUS N/A 08/27/2018   Procedure: UPPER ENDOSCOPIC ULTRASOUND (EUS) RADIAL;  Surgeon: Milus Banister, MD;  [facility];  Service: Endoscopy;  Laterality: N/A;  . EYE SURGERY    . FLEXIBLE SIGMOIDOSCOPY  01/07/2012   Procedure: FLEXIBLE SIGMOIDOSCOPY;  Surgeon: Lafayette Dragon, MD;  [facility];  Service: Endoscopy;  Laterality: N/A;  . HOT HEMOSTASIS N/A 06/07/2019   Procedure: HOT HEMOSTASIS (ARGON PLASMA COAGULATION/BICAP);  Surgeon: Ladene Artist, MD;  [facility];  Service: Endoscopy;  Laterality: N/A;  . IR IVC FILTER PLMT /  S&I Burke Keels GUID/MOD SED  06/07/2019  . IR RADIOLOGIST EVAL & MGMT  05/26/2019  . KNEE ARTHROSCOPY  2008   Right  . KNEE ARTHROSCOPY  2008   Left  . POLYPECTOMY    . [place] THORA/PARACENTESIS  10/28/2011  . Varicocele repair  75's    Family Hx:  Family History  Problem Relation Age of Onset  . Stroke Mother   . Diabetes Mother   . Lung cancer Brother   . Prostate cancer Brother   . Liver cancer Cousin   . Liver  cancer Paternal Uncle   . Colon cancer Neg Hx   . Stomach cancer Neg Hx   . Esophageal cancer Neg Hx   . Colon polyps Neg Hx   . Rectal cancer Neg Hx   . Pancreatic cancer Neg Hx     Social History:  reports that he has been smoking cigarettes and e-cigarettes. He has been smoking about 0.50 packs per day. He has never used smokeless tobacco. He reports previous alcohol use. He reports current drug use. Drug: Marijuana.  Allergies:  Allergies  Allergen Reactions  . [place] Venom Anaphylaxis, Shortness Of Breath and Other (See Comments)    Throat closes up/eye swell shut  . Nsaids Other (See Comments)    Stomach pain, Bleeding risk    Medications: Prior to Admission medications   Medication Sig Start Date End Date Taking? Authorizing Provider  buPROPion (WELLBUTRIN SR) 150 MG 12 hr tablet TAKE 1 TABLET BY MOUTH EVERY DAY 06/01/19  Yes Viviana Simpler I, MD  ferrous sulfate 325 (65 FE) MG EC tablet Take 325 mg by mouth every other day.   Yes [provider]  fluocinonide cream (LIDEX) [id number] % Apply 1 application topically 2 (two) times daily as needed (for skin rash/irritation). APPLY ON THE SKIN TWICE A DAY AS NEEDED FOR RASH 12/30/18  Yes [name], MD  HYDROcodone-acetaminophen (NORCO/VICODIN) 5-325 MG tablet Take 1 tablet by mouth 3 (three) times daily as needed (for pain.). 05/27/19  Yes [name], MD  lactulose (CHRONULAC) 10 GM/15ML solution Take 30 mLs (20 g total) by mouth daily. Please keep 3-4 bowel movement daily, please call your  GI doctor if your bm less than three times a day or more than 4 times a day. Your GI doctor will help you adjust lactulose dose. 06/09/19  Yes Florencia Reasons, MD  mirtazapine (REMERON) 45 MG tablet TAKE 1 TABLET (45 MG TOTAL) BY MOUTH AT BEDTIME. 05/12/19  Yes [name], MD  Multiple Vitamin (MULTIVITAMIN WITH MINERALS) TABS Take 1 tablet by mouth daily.    Yes [provider]  pantoprazole (PROTONIX) 40 MG tablet Take 1 tablet (40 mg total) by mouth 2 (two) times daily before a meal. 06/09/19 07/09/19 Yes Florencia Reasons, MD  silver sulfADIAZINE (SILVADENE) 1 % cream Apply topically 2 (two) times daily. To left thigh 06/09/19  Yes Florencia Reasons, MD  vitamin C (ASCORBIC ACID) 250 MG tablet Take 250 mg by mouth daily.    Yes [provider]     Labs:  Results for orders placed or performed during the hospital encounter of 06/17/19 (from the past 48 hour(s))  Sodium, urine, random     Status: None   Collection Time: 06/17/19  7:46 AM  Result Value Ref Range   Sodium, Ur <10 mmol/L    Comment: Performed at [facility], [place] [address], [place], [place] [address]  Comprehensive metabolic panel     Status: Abnormal   Collection Time: 06/17/19 10:34 PM  Result Value Ref Range   Sodium 129 (L) 135 - 145 mmol/L   Potassium 4.0 3.5 - 5.1 mmol/L   Chloride 101 98 - 111 mmol/L   CO2 17 (L) 22 - 32 mmol/L   Glucose, Bld 102 (H) 70 - 99 mg/dL   BUN 48 (H) 8 - 23 mg/dL   Creatinine, Ser 2.67 (H) 0.61 - 1.24 mg/dL   Calcium 8.0 (L) 8.9 - 10.3 mg/dL   Total Protein 5.5 (L) 6.5 - 8.1  g/dL   Albumin 2.2 (L) 3.5 - 5.0 g/dL   AST 36 15 - 41 U/L   ALT 27 0 - 44 U/L   Alkaline Phosphatase 112 38 - 126 U/L   Total Bilirubin 0.8 0.3 - 1.2 mg/dL   GFR calc non Af Amer 23 (L) >60 mL/min   GFR calc Af Amer 27 (L) >60 mL/min   Anion gap 11 5 - 15    Comment: Performed at [facility], [place] [address] [place], [place] [address]  Magnesium     Status: None    Collection Time: 06/17/19 10:34 PM  Result Value Ref Range   Magnesium 1.9 1.7 - 2.4 mg/dL    Comment: Performed at [facility], [place] [address], [place], [place] [address]  Phosphorus     Status: None   Collection Time: 06/17/19 10:34 PM  Result Value Ref Range   Phosphorus 4.5 2.5 - 4.6 mg/dL    Comment: Performed at [facility], [place] [address], [place], [place] [address]  TSH     Status: Abnormal   Collection Time: 06/17/19 10:34 PM  Result Value Ref Range   TSH 6.511 (H) 0.350 - 4.500 uIU/mL    Comment: Performed by a 3rd Generation assay with a functional sensitivity of <=0.01 uIU/mL. Performed at [facility], [place] [address] [place], [place] [id number]   Basic metabolic panel     Status: Abnormal   Collection Time: 06/18/19  5:34 AM  Result Value Ref Range   Sodium 129 (L) 135 - 145 mmol/L   Potassium 4.1 3.5 - 5.1 mmol/L   Chloride 100 98 - 111 mmol/L   CO2 20 (L) 22 - 32 mmol/L   Glucose, Bld 127 (H) 70 - 99 mg/dL   BUN 53 (H) 8 - 23 mg/dL   Creatinine, Ser 2.84 (H) 0.61 - 1.24 mg/dL   Calcium 8.1 (L) 8.9 - 10.3 mg/dL   GFR calc non Af Amer 21 (L) >60 mL/min   GFR calc Af Amer 25 (L) >60 mL/min   Anion gap 9 5 - 15    Comment: Performed at [facility], [place] [address] [place], [place] [id number]  CBC     Status: Abnormal   Collection Time: 06/18/19  5:34 AM  Result Value Ref Range   WBC 5.9 4.0 - 10.5 K/uL   RBC 2.71 (L) 4.22 - 5.81 MIL/uL   Hemoglobin 7.9 (L) 13.0 - 17.0 g/dL   HCT 26.5 (L) 39.0 - 52.0 %   MCV 97.8 80.0 - 100.0 fL   MCH 29.2 26.0 - 34.0 pg   MCHC 29.8 (L) 30.0 - 36.0 g/dL   RDW 18.8 (H) 11.5 - 15.5 %   Platelets 98 (L) 150 - 400 K/uL    Comment: REPEATED TO VERIFY PLATELET COUNT CONFIRMED BY SMEAR SPECIMEN CHECKED FOR CLOTS Immature Platelet Fraction may be clinically indicated, consider ordering this additional test [id number]    nRBC 0.0 0.0 - 0.2 %    Comment:  Performed at [facility], [place] [address] [place], [place] [address]  Urinalysis, Complete w Microscopic     Status: None   Collection Time: 06/18/19  7:46 AM  Result Value Ref Range   Color, Urine YELLOW YELLOW   APPearance CLEAR CLEAR   Specific Gravity, Urine 1.018 1.005 - 1.030   pH 5.0 5.0 - 8.0   Glucose, UA NEGATIVE NEGATIVE mg/dL   Hgb urine dipstick  NEGATIVE NEGATIVE   Bilirubin Urine NEGATIVE NEGATIVE   Ketones, ur NEGATIVE NEGATIVE mg/dL   Protein, ur NEGATIVE NEGATIVE mg/dL   Nitrite NEGATIVE NEGATIVE   Leukocytes,Ua NEGATIVE NEGATIVE   RBC / HPF 0-5 0 - 5 RBC/hpf   WBC, UA 0-5 0 - 5 WBC/hpf   Bacteria, UA NONE SEEN NONE SEEN   Squamous Epithelial / LPF 0-5 0 - 5   Hyaline Casts, UA PRESENT     Comment: Performed at [facility], [place] [address], [place], [place] [address]     ROS:  General no fever sweats chills eyes no visual loss blurred vision double vision ears nose mouth throat no hearing loss substantial throat  cardiovascular anginal chest pain orthopnea PND palpitation black spot on collection  respiratory no cough wheeze once this  abdominal system history of cirrhosis followed by GI.   Hematologic oncologic history of DVT unable to tolerate anticoagulation therapy.  Dermatologic no skin rashes no itching  endocrine no diabetes no thyroid disease  neurologic no stroke seizures numbness tingling or weakness to upper or lower extremities    Physical Exam: Vitals:   06/17/19 2158 06/18/19 0620  BP: 129/73 127/69  Pulse: 93 83  Resp: 20 16  Temp: 97.8 F (36.6 C) 98.3 F (36.8 C)  SpO2: 100% 100%     General: Calm comfortable cachectic nondistressed HEENT: Normocephalic atraumatic oropharynx was clear nasal mucosa clear Eyes: Extraocular movements are intact Neck: No thyromegaly JVP not elevated neck was supple Heart: Regular rate and rhythm no murmurs rubs gallops bilateral lower extremity edema Lungs: No  wheezes rales Abdomen: Soft nontender bowel sounds nondistended ascites present Extremities: 3+ pitting edema Skin: Nonicteric no bruises Neuro: Alert oriented  Assessment/Plan: 1.Chronic kidney disease stage III baseline urinalysis bland renal ultrasound showed echogenic kidneys consistent with medical disease no hydronephrosis.  No nephrotoxins 2.  Acute kidney injury appears to be nonoliguric urine sodium less than 10.  Did receive IV contrast 06/07/2019.  Paracentesis 05/30/2019 with removal of 3.2 L.  Inactive urine sediment.  Negative for protein.  Possibly acute tubular necrosis.  No hemodynamic instability noted at this time.  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 3.  Hyponatremia urine sodium less than 10 volume overloaded consistent with cirrhosis. 4.  Metabolic acidosis is mild would replete with sodium bicarbonate 650 mg 3 times daily 5.  Hypertension/volume avoid nonsteroidal anti-inflammatory drugs ACE inhibitors ARB use.  The use of diuretics for treatment of his lower extremity edema may worsen his renal function.  Will give 80 mg IV Lasix twice daily 6.  Disposition consider palliative medicine consult.   Sherril Croon 06/18/2019, 9:13 AM

## 2019-06-18 NOTE — Progress Notes (Signed)
Patient is admitted for HRS.  Nephrology on board.  This is primarily a renal issue.  ESLD stable.  We are aware of his admission and here if the need arises.  We do agree with renal that palliative consult is a good idea.

## 2019-06-19 ENCOUNTER — Inpatient Hospital Stay (HOSPITAL_COMMUNITY): Payer: 59

## 2019-06-19 DIAGNOSIS — R188 Other ascites: Secondary | ICD-10-CM

## 2019-06-19 LAB — BASIC METABOLIC PANEL
Anion gap: 14 (ref 5–15)
BUN: 51 mg/dL — ABNORMAL HIGH (ref 8–23)
CO2: 18 mmol/L — ABNORMAL LOW (ref 22–32)
Calcium: 9 mg/dL (ref 8.9–10.3)
Chloride: 102 mmol/L (ref 98–111)
Creatinine, Ser: 2.97 mg/dL — ABNORMAL HIGH (ref 0.61–1.24)
GFR calc Af Amer: 23 mL/min — ABNORMAL LOW (ref >60)
GFR calc non Af Amer: 20 mL/min — ABNORMAL LOW (ref >60)
Glucose, Bld: 84 mg/dL (ref 70–99)
Potassium: 4.7 mmol/L (ref 3.5–5.1)
Sodium: 134 mmol/L — ABNORMAL LOW (ref 135–145)

## 2019-06-19 LAB — BODY FLUID CELL COUNT WITH DIFFERENTIAL
Lymphs, Fluid: 5 %
Monocyte-Macrophage-Serous Fluid: 20 % — ABNORMAL LOW (ref 50–90)
Neutrophil Count, Fluid: 75 % — ABNORMAL HIGH (ref 0–25)
Total Nucleated Cell Count, Fluid: 196 cu mm (ref 0–1000)

## 2019-06-19 LAB — CBC
HCT: 25.4 % — ABNORMAL LOW (ref 39.0–52.0)
Hemoglobin: 7.7 g/dL — ABNORMAL LOW (ref 13.0–17.0)
MCH: 29.5 pg (ref 26.0–34.0)
MCHC: 30.3 g/dL (ref 30.0–36.0)
MCV: 97.3 fL (ref 80.0–100.0)
Platelets: 95 10*3/uL — ABNORMAL LOW (ref 150–400)
RBC: 2.61 MIL/uL — ABNORMAL LOW (ref 4.22–5.81)
RDW: 18.4 % — ABNORMAL HIGH (ref 11.5–15.5)
WBC: 10.2 10*3/uL (ref 4.0–10.5)
nRBC: 0 % (ref 0.0–0.2)

## 2019-06-19 LAB — PROTEIN, PLEURAL OR PERITONEAL FLUID: Total protein, fluid: <3 g/dL

## 2019-06-19 MED ORDER — LIDOCAINE HCL 1 % IJ SOLN
INTRAMUSCULAR | Status: AC
  Filled 2019-06-19: qty 10

## 2019-06-19 MED ORDER — HYDROCODONE-ACETAMINOPHEN 5-325 MG PO TABS
1.0000 | ORAL_TABLET | Freq: Four times a day (QID) | ORAL | Status: DC | PRN
  Administered 2019-06-19 – 2019-06-20 (×5): 1 via ORAL
  Administered 2019-06-21 – 2019-06-23 (×6): 2 via ORAL
  Filled 2019-06-19 (×2): qty 1
  Filled 2019-06-19: qty 2
  Filled 2019-06-19: qty 1
  Filled 2019-06-19 (×2): qty 2
  Filled 2019-06-19 (×2): qty 1
  Filled 2019-06-19 (×3): qty 2

## 2019-06-19 MED ORDER — SODIUM CHLORIDE 0.9 % IV SOLN
2.0000 g | INTRAVENOUS | Status: DC
  Administered 2019-06-19 – 2019-06-22 (×4): 2 g via INTRAVENOUS
  Filled 2019-06-19: qty 2
  Filled 2019-06-19: qty 20
  Filled 2019-06-19: qty 2
  Filled 2019-06-19 (×2): qty 20

## 2019-06-19 MED ORDER — SODIUM BICARBONATE 650 MG PO TABS
650.0000 mg | ORAL_TABLET | Freq: Three times a day (TID) | ORAL | Status: DC
  Administered 2019-06-19 – 2019-06-23 (×13): 650 mg via ORAL
  Filled 2019-06-19 (×13): qty 1

## 2019-06-19 NOTE — Progress Notes (Signed)
[facility] ROUNDING NOTE   Subjective:   This is a 73 year old gentleman with a history of liver cirrhosis complicated by scrotal edema GI bleed alcohol abuse history of DVT but no longer able to tolerate anticoagulation therapy.  Is a history of chronic kidney disease and it appears that his baseline creatinine has oscillated around 1.4 to 1.8 mg/dL urine output 200 cc 12/4.  Did receive a CT scan with contrast administered 06/07/2019.  Last paracentesis was performed 05/30/2019 with removal of 3.2 L  Blood pressure 120/63 pulse 109 temperature 98.4 O2 sats 99% room air.  Urine output 400 cc 06/18/2019  Sodium 134 potassium 4.7 chloride 102 CO2 18 glucose 84 BUN 51 creatinine 2.97 phosphorus 4.5 magnesium 1.9 albumin 2.2 AST 36 ALT 27 WBC 10.2 hemoglobin 7.7 platelets 95  Wellbutrin 150 mg daily, iron 325 mg every other day, Lasix 80 mg twice daily, lactulose 20 g daily, midodrine 7.5 mg 3 times daily, Remeron 45 mg nightly, multivitamins 1 daily Protonix 40 mg daily sodium bicarbonate 650 mg twice daily, Carafate 1 g 3 times daily.  IV albumin 50 g daily.  Octreotide 50 mcg IV continuous infusion   Objective:  Vital signs in last 24 hours:  Temp:  [97.7 F (36.5 C)-98.4 F (36.9 C)] 98.4 F (36.9 C) (12/05 0605) Pulse Rate:  [86-109] 109 (12/05 0605) Resp:  [16-20] 20 (12/05 0605) BP: (120-129)/(62-73) 120/63 (12/05 0605) SpO2:  [99 %-100 %] 99 % (12/05 0605)  Weight change:  Filed Weights   06/17/19 1836 06/18/19 0620  Weight: 75.3 kg 76.4 kg    Intake/Output: I/O last 3 completed shifts: In: 1624.3 [P.O.:1200; I.V.:124.3; IV Piggyback:300] Out: 400 [Urine:400]   Intake/Output this shift:  No intake/output data recorded.  General: Calm comfortable cachectic nondistressed HEENT: Normocephalic atraumatic oropharynx was clear nasal mucosa clear Eyes: Extraocular movements are intact Neck: No thyromegaly JVP not elevated neck was supple Heart: Regular rate  and rhythm no murmurs rubs gallops bilateral lower extremity edema Lungs: No wheezes rales Abdomen: Soft nontender bowel sounds nondistended ascites present Extremities: 3+ pitting edema Skin: Nonicteric no bruises Neuro: Alert oriented   Basic Metabolic Panel: Recent Labs  Lab 06/14/19 1312 06/16/19 1413 06/17/19 2234 06/18/19 0534 06/19/19 0511  NA 129* 129* 129* 129* 134*  K 4.4 3.7 4.0 4.1 4.7  CL 101 102 101 100 102  CO2 19 18* 17* 20* 18*  GLUCOSE 129* 108* 102* 127* 84  BUN 38* 46* 48* 53* 51*  CREATININE 2.52* 2.52* 2.67* 2.84* 2.97*  CALCIUM 8.4 8.2* 8.0* 8.1* 9.0  MG  --   --  1.9  --   --   PHOS  --   --  4.5  --   --     Liver Function Tests: Recent Labs  Lab 06/14/19 1312 06/17/19 2234  AST 28 36  ALT 22 27  ALKPHOS 107 112  BILITOT 0.6 0.8  PROT 5.4* 5.5*  ALBUMIN 2.3* 2.2*   No results for input(s): LIPASE, AMYLASE in the last 168 hours. No results for input(s): AMMONIA in the last 168 hours.  CBC: Recent Labs  Lab 06/14/19 1312 06/18/19 0534 06/19/19 0511  WBC 8.0 5.9 10.2  HGB 8.4* 7.9* 7.7*  HCT 27.2* 26.5* 25.4*  MCV 96.2 97.8 97.3  PLT 179.0 98* 95*    Cardiac Enzymes: No results for input(s): CKTOTAL, CKMB, CKMBINDEX, TROPONINI in the last 168 hours.  BNP: Invalid input(s): POCBNP  CBG: No results for input(s): GLUCAP in the  last 168 hours.  Microbiology: Results for orders placed or performed during the hospital encounter of 06/17/19  SARS CORONAVIRUS 2 (TAT 6-24 HRS) Nasopharyngeal Nasopharyngeal Swab     Status: None   Collection Time: 06/18/19  6:06 AM   Specimen: Nasopharyngeal Swab  Result Value Ref Range Status   SARS Coronavirus 2 NEGATIVE NEGATIVE Final    Comment: (NOTE) SARS-CoV-2 target nucleic acids are NOT DETECTED. The SARS-CoV-2 RNA is generally detectable in upper and lower respiratory specimens during the acute phase of infection. Negative results do not preclude SARS-CoV-2 infection, do not rule  out co-infections with other pathogens, and should not be used as the sole basis for treatment or other patient management decisions. Negative results must be combined with clinical observations, patient history, and epidemiological information. The expected result is Negative. Fact Sheet for Patients: SugarRoll.be Fact Sheet for Healthcare Providers: https://www.woods-mathews.com/ This test is not yet approved or cleared by the [place] FDA and  has been authorized for detection and/or diagnosis of SARS-CoV-2 by FDA under an Emergency Use Authorization (EUA). This EUA will remain  in effect (meaning this test can be used) for the duration of the COVID-19 declaration under Section 56 4(b)(1) of the Act, 21 U.S.C. section 360bbb-3(b)(1), unless the authorization is terminated or revoked sooner. Performed at [facility], [place] [address], [place], [place] [address]     Coagulation Studies: No results for input(s): LABPROT, INR in the last 72 hours.  Urinalysis: Recent Labs    06/18/19 0746  COLORURINE YELLOW  LABSPEC 1.018  PHURINE 5.0  GLUCOSEU NEGATIVE  HGBUR NEGATIVE  BILIRUBINUR NEGATIVE  KETONESUR NEGATIVE  PROTEINUR NEGATIVE  NITRITE NEGATIVE  LEUKOCYTESUR NEGATIVE      Imaging: US Renal  Result Date: 06/17/2019 CLINICAL DATA:  Acute kidney injury EXAM: RENAL / URINARY TRACT ULTRASOUND COMPLETE COMPARISON:  06/08/2019 FINDINGS: Right Kidney: Renal measurements: 9.9 x 4.5 x 5.2 cm = volume: 119.1 mL. Cortex is echogenic. Negative for hydronephrosis or mass Left Kidney: Renal measurements: 9 x 5 x 4.7 cm = volume: 107.8 mL. Cortex is echogenic. Negative for hydronephrosis or mass. Bladder: Appears normal for degree of bladder distention. Other: Cirrhotic morphology of the liver. Moderate ascites within the abdomen and pelvis. IMPRESSION: 1. Echogenic kidneys consistent with medical renal disease. No hydronephrosis 2.  Cirrhosis of the liver with moderate ascites Electronically Signed   By: Donavan Foil M.D.   On: 06/17/2019 22:42     Medications:   . albumin human    . octreotide  (SANDOSTATIN)    IV infusion 50 mcg/hr (06/18/19 2243)   . buPROPion  150 mg Oral Daily  . ferrous sulfate  325 mg Oral QODAY  . furosemide  80 mg Intravenous BID  . lactulose  20 g Oral Daily  . midodrine  7.5 mg Oral TID WC  . mirtazapine  45 mg Oral QHS  . multivitamin with minerals  1 tablet Oral Daily  . pantoprazole  40 mg Oral BID AC  . sodium bicarbonate  650 mg Oral BID  . sucralfate  1 g Oral TID BM  . vitamin C  250 mg Oral Daily   HYDROcodone-acetaminophen  Assessment/ Plan:  1.Chronic kidney disease stage III baseline urinalysis bland renal ultrasound showed echogenic kidneys consistent with medical disease no hydronephrosis.  No nephrotoxins 2.  Acute kidney injury appears to be nonoliguric urine sodium less than 10.  Did receive IV contrast 06/07/2019.  Paracentesis 05/30/2019 with removal of 3.2 L.  Inactive urine sediment.  Negative for protein.  Possibly acute tubular necrosis.  No hemodynamic instability noted at this time.  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 3.  Hyponatremia urine sodium less than 10.  Volume overloaded consistent with cirrhosis. 4.  Metabolic acidosis is mild will increase sodium bicarbonate to 650 mg 3 times daily 5.  Hypertension/volume avoid nonsteroidal anti-inflammatory drugs ACE inhibitors ARB use.  The use of diuretics for treatment of his lower extremity edema may worsen his renal function.  Will give 80 mg IV Lasix twice daily. 6.  Disposition consider palliative medicine consult.    LOS: 2 Billy [name] @TODAY @8 :11 AM

## 2019-06-19 NOTE — Consult Note (Signed)
Consultation Note Date: 06/19/2019   Patient Name: Billy [name]  DOB: [birth year]  MRN: [id number]  Age / Sex: 73 y.o., male  PCP: [name], MD Referring Physician: Hosie Poisson, MD  Reason for Consultation: Establishing goals of care  HPI/Patient Profile: 73 y.o. male  with past medical history of liver cirrhosis with prior scrotal edema, esophageal varices, GI bleed, alcohol abuse, CKD, DVT (not on anticoagulation due to prior GI bleed) admitted on 06/17/2019 with concern for hepatorenal syndrome.  Palliative consulted for goals of care.   Clinical Assessment and Goals of Care: Palliative care consult received.  Chart reviewed including personal review of pertinent labs and imaging.  I met today with Billy [name].  He is awake, alert, and pleasant in conversation.  He reports that the doctors have been doing a good job explaining things to him, but he finds the severity of his condition difficult to wrap is head around as, "I have been feeling so good lately."   We discussed clinical course as well as wishes moving forward in regard to advanced directives.  Discussed basic pathogenesis of hepatorenal syndrome and often this portends poor prognosis.  He was accepting but appropriately upset by this line of conversation.  Concepts specific to code status and surrogate decision making discussed.  He reports that he has completed living will in the past and would like to review it again, see how he responds to current interventions, and discuss with his wife prior to making further decisions regarding his [place].   Questions and concerns addressed.   PMT will continue to support holistically.  SUMMARY OF RECOMMENDATIONS   - Full code/Full scope treatment - His wife is his surrogate decision maker in the event he cannot make his own medical decisions. - He will ask his wife to bring in his living will  for [place] to review together. - We had good initial conversations regarding [place] and severity of his condition.  He would like to see how he responds to current interventions and review his prior completed living will before further conversation regarding [place]. - Will plan to follow up in the next 24-48 hours to continue conversation based on his clinical response to current interventions.  Code Status/Advance Care Planning: Full code   Psycho-social/Spiritual:   Desire for further Chaplaincy support:no  Prognosis:   Unable to determine  Discharge Planning: To Be Determined      Primary Diagnoses: Present on Admission: . Acute on chronic renal failure ([place])   I have reviewed the medical record, interviewed the patient and family, and examined the patient. The following aspects are pertinent.  Past Medical History:  Diagnosis Date  . Acute deep vein thrombosis (DVT) of femoral vein of right lower extremity ([place]) 03/30/2019  . Acute posthemorrhagic anemia   . Alcoholic cirrhosis of liver ([place]) 09/24/2011   Years of alcohol use. Progressed from fatty liver to cirrhosis based upon review imaging. Manifestations are portal hypertension with esophageal varices, portal gastropathy, rectal varices and he may have portal  colopathy. Ascites also present. Naive to HAV and HBV Claims he has had pneumococcal vaccine   . Alcoholism ([place])   . Angiodysplasia of colon 09/11/2011   Multiple in the right colon, ablated with APC. Question if this is portal colopathy.   . Anxiety   . Back pain   . Basal cell carcinoma   . Bilateral varicoceles   . Campylobacter diarrhea 01/07/2012  . Cataract    removed  . DDD (degenerative disc disease), lumbar   . Depression    mild at most  . Diverticulosis of colon   . Erectile dysfunction   . Gastric ulcer 08/2011   small, antral ulcer  . GERD (gastroesophageal reflux disease)    takes Omeprazole prn  . GI hemorrhage 2006, 2013   esophagitis and 1+ varices,  melena in 2013   . Grade I diastolic dysfunction   . Hearing loss   . Hepatorenal syndrome (HCC)-Type 2 06/14/2019  . History of ascites   . History of blood transfusion    as a child and no abnormal reaction noted  . History of bronchitis    >42yr ago  . History of dizziness   . Hydrocele   . Hypertension    takes Nadolol daily  . Narcotic dependence ([place]   . Personal history of colonic polyps   . Portal hypertension ([place] 08/2011  . 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 Right hydrocele   . Scrotal edema   . Sleep disturbance    takes Remeron nightly  . Splenomegaly   . Thrombocytopenia, unspecified ([place]   . Varices, esophageal ([place] 08/2011   Grade 2, mid-esophagus   Social History   Socioeconomic History  . Marital status: Married    Spouse name: Not on file  . Number of children: 1  . Years of education: Not on file  . Highest education level: Not on file  Occupational History  . Occupation: Retired    Comment: mostly from rResearch officer, political partyNeeds  . Financial resource strain: Not on file  . Food insecurity    Worry: Not on file    Inability: Not on file  . Transportation needs    Medical: Not on file    Non-medical: Not on file  Tobacco Use  . Smoking status: Light Tobacco Smoker    Packs/day: 0.50    Types: Cigarettes, E-cigarettes  . Smokeless tobacco: Never Used  Substance and Sexual Activity  . Alcohol use: Not Currently    Alcohol/week: 0.0 standard drinks  . Drug use: Yes    Types: Marijuana    Comment: occasionally, last time couple of weeks ago  . Sexual activity: Never  Lifestyle  . Physical activity    Days per week: Not on file    Minutes per session: Not on file  . Stress: Not on file  Relationships  . Social [place] phone: Not on file    Gets together: Not on file    Attends religious service: Not on file    Active member of club or organization: Not on file    Attends meetings of clubs or organizations: Not on file     Relationship status: Not on file  Other Topics Concern  . Not on file  Social History Narrative   Married- 2nd   1 son (adopted) from previous marriage      Has living will   Wife is  health care POA---alternate is son   Would accept attempts at resuscitation  No tube feeds if cognitively unaware      Family History  Problem Relation Age of Onset  . Stroke Mother   . Diabetes Mother   . Lung cancer Brother   . Prostate cancer Brother   . Liver cancer Cousin   . Liver cancer Paternal Uncle   . Colon cancer Neg Hx   . Stomach cancer Neg Hx   . Esophageal cancer Neg Hx   . Colon polyps Neg Hx   . Rectal cancer Neg Hx   . Pancreatic cancer Neg Hx    Scheduled Meds: . buPROPion  150 mg Oral Daily  . ferrous sulfate  325 mg Oral QODAY  . furosemide  80 mg Intravenous BID  . lactulose  20 g Oral Daily  . midodrine  7.5 mg Oral TID WC  . mirtazapine  45 mg Oral QHS  . multivitamin with minerals  1 tablet Oral Daily  . pantoprazole  40 mg Oral BID AC  . sodium bicarbonate  650 mg Oral TID  . sucralfate  1 g Oral TID BM  . vitamin C  250 mg Oral Daily   Continuous Infusions: . albumin human    . octreotide  (SANDOSTATIN)    IV infusion 50 mcg/hr (06/18/19 2243)   PRN Meds:.HYDROcodone-acetaminophen Medications Prior to Admission:  Prior to Admission medications   Medication Sig Start Date End Date Taking? Authorizing Provider  buPROPion (WELLBUTRIN SR) 150 MG 12 hr tablet TAKE 1 TABLET BY MOUTH EVERY DAY 06/01/19  Yes Viviana Simpler I, MD  ferrous sulfate 325 (65 FE) MG EC tablet Take 325 mg by mouth every other day.   Yes [provider]  fluocinonide cream (LIDEX) 2.83 % Apply 1 application topically 2 (two) times daily as needed (for skin rash/irritation). APPLY ON THE SKIN TWICE A DAY AS NEEDED FOR RASH 12/30/18  Yes [name], MD  HYDROcodone-acetaminophen (NORCO/VICODIN) 5-325 MG tablet Take 1 tablet by mouth 3 (three) times daily as needed (for  pain.). 05/27/19  Yes [name], MD  lactulose (CHRONULAC) 10 GM/15ML solution Take 30 mLs (20 g total) by mouth daily. Please keep 3-4 bowel movement daily, please call your GI doctor if your bm less than three times a day or more than 4 times a day. Your GI doctor will help you adjust lactulose dose. 06/09/19  Yes Florencia Reasons, MD  mirtazapine (REMERON) 45 MG tablet TAKE 1 TABLET (45 MG TOTAL) BY MOUTH AT BEDTIME. 05/12/19  Yes [name], MD  Multiple Vitamin (MULTIVITAMIN WITH MINERALS) TABS Take 1 tablet by mouth daily.    Yes [provider]  pantoprazole (PROTONIX) 40 MG tablet Take 1 tablet (40 mg total) by mouth 2 (two) times daily before a meal. 06/09/19 07/09/19 Yes Florencia Reasons, MD  silver sulfADIAZINE (SILVADENE) 1 % cream Apply topically 2 (two) times daily. To left thigh 06/09/19  Yes Florencia Reasons, MD  vitamin C (ASCORBIC ACID) 250 MG tablet Take 250 mg by mouth daily.    Yes [provider]   Allergies  Allergen Reactions  . Yellow Jacket Venom Anaphylaxis, Shortness Of Breath and Other (See Comments)    Throat closes up/eye swell shut  . Nsaids Other (See Comments)    Stomach pain, Bleeding risk   Review of Systems  Constitutional: Positive for fatigue and unexpected weight change.  Gastrointestinal: Positive for abdominal distention.   Physical Exam General: Alert, awake, in no acute distress.  HEENT: No bruits, no goiter, no  JVD Heart: Regular rate and rhythm. No murmur appreciated. Lungs: Good air movement, clear Abdomen: Soft, nontender, distended, positive bowel sounds.  Skin: Warm and dry Ext: + edema Neuro: Grossly intact, nonfocal.  Vital Signs: BP 120/63 (BP Location: Left Arm)   Pulse (!) 109   Temp 98.4 F (36.9 C) (Oral)   Resp 20   Ht 6' 3" (1.905 m)   Wt 76.4 kg   SpO2 99%   BMI 21.05 kg/m  Pain Scale: 0-10   Pain Score: Asleep   SpO2: SpO2: 99 % O2 Device:SpO2: 99 % O2 Flow Rate: .   IO: Intake/output summary:    Intake/Output Summary (Last 24 hours) at 06/19/2019 7322 Last data filed at 06/18/2019 1832 Gross per 24 hour  Intake 720 ml  Output 200 ml  Net 520 ml    LBM: Last BM Date: 06/18/19 Baseline Weight: Weight: 75.3 kg Most recent weight: Weight: 76.4 kg     Palliative Assessment/Data:   Flowsheet Rows     Most Recent Value  Intake Tab  Referral Department  Hospitalist  Unit at Time of Referral  Med/Surg Unit  Palliative Care Primary Diagnosis  Nephrology  Date Notified  06/17/19  Palliative Care Type  New Palliative care  Reason for referral  Clarify Goals of Care  Date of Admission  06/17/19  Date first seen by Palliative Care  06/18/19  # of days Palliative referral response time  1 Day(s)  # of days IP prior to Palliative referral  0  Clinical Assessment  Palliative Performance Scale Score  70%  Psychosocial & Spiritual Assessment  Palliative Care Outcomes  Patient/Family meeting held?  Yes  Who was at the meeting?  Patient      Time In: 1700 Time Out: 1820 Time Total: 80 Greater than 50%  of this time was spent counseling and coordinating care related to the above assessment and plan.  Signed by: Micheline Rough, MD   Please contact Palliative Medicine Team phone at [phone] for questions and concerns.  For individual provider: See Shea Evans

## 2019-06-19 NOTE — Progress Notes (Signed)
PROGRESS NOTE    Billy [name]  [id number] DOB: 05-Jan-1946 DOA: 06/17/2019 PCP: [name], MD   Brief Narrative:  73 year old gentleman prior history of liver cirrhosis, right lower extremity DVT not on anticoagulation due to GI bleed, long standing history of alcohol use disorder, GERD, chronic diastolic heart failure history of peptic ulcer disease, gastritis, hypertension, thrombocytopenia, esophageal varices was admitted for evaluation of acute kidney injury and possible hepatorenal syndrome.  Patient currently denies any chest pain shortness of breath, nausea vomiting .  Patient reports some abdominal pain, suspicious for SBP.  Ultrasound paracentesis done today and fluid sent for analysis.  Patient will be empirically started on Rocephin.  Assessment & Plan:   Active Problems:   Acute on chronic renal failure (HCC)   Liver cirrhosis with ascites and esophageal varices.  He appears to be fluid overloaded. Started him on IV lasix 80 mg BID.  Ultrasound paracentesis ordered with fluid to be sent for analysis for concern for SBP as he reports slight worsening of abdominal pain. Patient creatinine worsened to 2.9 today with diuresis. Continue with lactulose to prevent encephalopathy.      Acute on stage 3 CKD; ? hepato renal syndrome vs ATN vs fluid overload Baseline creatinine around 1.5 , on admission at 2.5, worsened to 2.97 today.  Ruled out UTI. US renal does not show any hydronephrosis.  Appreciate nephrology recommendations.  Started the patient on octreotide and albumin . Repeat renal parameters in am.     GERD/ PUD/ esophageal varices:  Pt denies any hematemesis or hematochezia: Continue with PPI and sucralfate   Right LE DVT:  On anticoagulation secondary to GI bleed    Anemia of chronic disease:  Hemoglobin around 7.7 today Pt denies any tarry stools or rectal bleeding.  Transfuse to keep hemoglobin greater than 7.    Thrombocytopenia;   Chronic and secondary to liver disease.  No bleeding evident.   Hyponatremia:  Sodium improved to 134 with diuresis.  In view of the multiple medical problems. Worsening renal parameters , palliative care consulted for goals of care.  Appreciate palliative care input.   DVT prophylaxis: scd's Code Status: full code.  Family Communication: none at bedside.  Disposition Plan: pending clinical improvement.    Consultants:   Nephrology.  Palliative care  Procedures: Ultrasound paracentesis  Antimicrobials: none.    Subjective: No nausea vomiting but slight abdominal pain today.  No chest pain, shortness of breath or cough.  Objective: Vitals:   06/18/19 1338 06/18/19 2046 06/19/19 0605 06/19/19 1215  BP: 122/62 129/73 120/63 128/66  Pulse: 86 90 (!) 109 96  Resp: 16 20 20 17   Temp: 97.7 F (36.5 C) 97.8 F (36.6 C) 98.4 F (36.9 C) 98.3 F (36.8 C)  TempSrc: Oral Oral Oral Oral  SpO2: 100% 100% 99% 98%  Weight:      Height:        Intake/Output Summary (Last 24 hours) at 06/19/2019 1245 Last data filed at 06/18/2019 1832 Gross per 24 hour  Intake 480 ml  Output 200 ml  Net 280 ml   Filed Weights   06/17/19 1836 06/18/19 0620  Weight: 75.3 kg 76.4 kg    Examination:  General exam: Alert and comfortable, not in any kind of distress Respiratory system: Air entry fair bilateral, no wheezing or rhonchi Cardiovascular system: S1-S2 heard, regular rate rhythm, no pedal edema Gastrointestinal system: Abdomen is soft, mildly tender, distended, bowel sounds minimal Central nervous system: Alert and oriented Extremities:,  No pedal edema Skin: No rashes Psychiatry: Mood appropriate    Data Reviewed: I have personally reviewed following labs and imaging studies  CBC: Recent Labs  Lab 06/14/19 1312 06/18/19 0534 06/19/19 0511  WBC 8.0 5.9 10.2  HGB 8.4* 7.9* 7.7*  HCT 27.2* 26.5* 25.4*  MCV 96.2 97.8 97.3  PLT 179.0 98* 95*   Basic Metabolic Panel:  Recent Labs  Lab 06/14/19 1312 06/16/19 1413 06/17/19 2234 06/18/19 0534 06/19/19 0511  NA 129* 129* 129* 129* 134*  K 4.4 3.7 4.0 4.1 4.7  CL 101 102 101 100 102  CO2 19 18* 17* 20* 18*  GLUCOSE 129* 108* 102* 127* 84  BUN 38* 46* 48* 53* 51*  CREATININE 2.52* 2.52* 2.67* 2.84* 2.97*  CALCIUM 8.4 8.2* 8.0* 8.1* 9.0  MG  --   --  1.9  --   --   PHOS  --   --  4.5  --   --    GFR: Estimated Creatinine Clearance: 24.7 mL/min (A) (by C-G formula based on SCr of 2.97 mg/dL (H)). Liver Function Tests: Recent Labs  Lab 06/14/19 1312 06/17/19 2234  AST 28 36  ALT 22 27  ALKPHOS 107 112  BILITOT 0.6 0.8  PROT 5.4* 5.5*  ALBUMIN 2.3* 2.2*   No results for input(s): LIPASE, AMYLASE in the last 168 hours. No results for input(s): AMMONIA in the last 168 hours. Coagulation Profile: Recent Labs  Lab 06/14/19 1312  INR 1.3*   Cardiac Enzymes: No results for input(s): CKTOTAL, CKMB, CKMBINDEX, TROPONINI in the last 168 hours. BNP (last 3 results) No results for input(s): PROBNP in the last 8760 hours. HbA1C: No results for input(s): HGBA1C in the last 72 hours. CBG: No results for input(s): GLUCAP in the last 168 hours. Lipid Profile: No results for input(s): CHOL, HDL, LDLCALC, TRIG, CHOLHDL, LDLDIRECT in the last 72 hours. Thyroid Function Tests: Recent Labs    06/17/19 2234  TSH 6.511*   Anemia Panel: Recent Labs    06/16/19 1413  FERRITIN 398.1*   Sepsis Labs: No results for input(s): PROCALCITON, LATICACIDVEN in the last 168 hours.  Recent Results (from the past 240 hour(s))  SARS CORONAVIRUS 2 (TAT 6-24 HRS) Nasopharyngeal Nasopharyngeal Swab     Status: None   Collection Time: 06/18/19  6:06 AM   Specimen: Nasopharyngeal Swab  Result Value Ref Range Status   SARS Coronavirus 2 NEGATIVE NEGATIVE Final    Comment: (NOTE) SARS-CoV-2 target nucleic acids are NOT DETECTED. The SARS-CoV-2 RNA is generally detectable in upper and lower respiratory specimens  during the acute phase of infection. Negative results do not preclude SARS-CoV-2 infection, do not rule out co-infections with other pathogens, and should not be used as the sole basis for treatment or other patient management decisions. Negative results must be combined with clinical observations, patient history, and epidemiological information. The expected result is Negative. Fact Sheet for Patients: SugarRoll.be Fact Sheet for Healthcare Providers: https://www.woods-mathews.com/ This test is not yet approved or cleared by the [place] FDA and  has been authorized for detection and/or diagnosis of SARS-CoV-2 by FDA under an Emergency Use Authorization (EUA). This EUA will remain  in effect (meaning this test can be used) for the duration of the COVID-19 declaration under Section 56 4(b)(1) of the Act, 21 U.S.C. section 360bbb-3(b)(1), unless the authorization is terminated or revoked sooner. Performed at [facility], [place] [address] [place], [place] [address]  Radiology Studies: US Renal  Result Date: 06/17/2019 CLINICAL DATA:  Acute kidney injury EXAM: RENAL / URINARY TRACT ULTRASOUND COMPLETE COMPARISON:  06/08/2019 FINDINGS: Right Kidney: Renal measurements: 9.9 x 4.5 x 5.2 cm = volume: 119.1 mL. Cortex is echogenic. Negative for hydronephrosis or mass Left Kidney: Renal measurements: 9 x 5 x 4.7 cm = volume: 107.8 mL. Cortex is echogenic. Negative for hydronephrosis or mass. Bladder: Appears normal for degree of bladder distention. Other: Cirrhotic morphology of the liver. Moderate ascites within the abdomen and pelvis. IMPRESSION: 1. Echogenic kidneys consistent with medical renal disease. No hydronephrosis 2. Cirrhosis of the liver with moderate ascites Electronically Signed   By: Donavan Foil M.D.   On: 06/17/2019 22:42        Scheduled Meds: . buPROPion  150 mg Oral Daily  . ferrous sulfate  325 mg Oral  QODAY  . furosemide  80 mg Intravenous BID  . lactulose  20 g Oral Daily  . midodrine  7.5 mg Oral TID WC  . mirtazapine  45 mg Oral QHS  . multivitamin with minerals  1 tablet Oral Daily  . pantoprazole  40 mg Oral BID AC  . sodium bicarbonate  650 mg Oral TID  . sucralfate  1 g Oral TID BM  . vitamin C  250 mg Oral Daily   Continuous Infusions: . albumin human 50 g (06/19/19 1028)  . octreotide  (SANDOSTATIN)    IV infusion 50 mcg/hr (06/19/19 1019)     LOS: 2 days        Hosie Poisson, MD [facility] 06/19/2019, 12:45 PM

## 2019-06-19 NOTE — Progress Notes (Signed)
Daily Progress Note   Patient Name: Billy [name]       Date: 06/19/2019 DOB: June 30, 1946  Age: 73 y.o. MRN#: [id number] Attending Physician: Hosie Poisson, MD Primary Care Physician: [name], MD Admit Date: 06/17/2019  Reason for Consultation/Follow-up: Establishing goals of care  Subjective: I saw and examined Mr. Billy [name] today.    He reports getting "bad news" today.  He understands that his Cr has continued to rise and that this is very concerning.  Reports that his pain in abdomen has worsened and is not more of a sharp pain on right side.  States feels different and more intense than his chronic back pain or when he needs to have paracentesis.  Discussed his overall clinical course and concerning nature of low urine output and rising Cr.   Length of Stay: 2  Current Medications: Scheduled Meds:  . buPROPion  150 mg Oral Daily  . ferrous sulfate  325 mg Oral QODAY  . furosemide  80 mg Intravenous BID  . lactulose  20 g Oral Daily  . lidocaine      . midodrine  7.5 mg Oral TID WC  . mirtazapine  45 mg Oral QHS  . multivitamin with minerals  1 tablet Oral Daily  . pantoprazole  40 mg Oral BID AC  . sodium bicarbonate  650 mg Oral TID  . sucralfate  1 g Oral TID BM  . vitamin C  250 mg Oral Daily    Continuous Infusions: . albumin human 50 g (06/19/19 1028)  . cefTRIAXone (ROCEPHIN)  IV    . octreotide  (SANDOSTATIN)    IV infusion 50 mcg/hr (06/19/19 1019)    PRN Meds: HYDROcodone-acetaminophen  Physical Exam         General: Alert, awake, in no acute distress.  HEENT: No bruits, no goiter, no JVD Heart: Regular rate and rhythm. No murmur appreciated. Lungs: Good air movement, clear Abdomen: Soft, nontender, distended, positive bowel sounds.  Skin:  Warm and dry Ext: + edema Neuro: Grossly intact, nonfocal.  Vital Signs: BP 125/65 (BP Location: Left Arm)   Pulse 96   Temp 98.3 F (36.8 C) (Oral)   Resp 17   Ht 6\' 3"  (1.905 m)   Wt 76.4 kg   SpO2 98%   BMI 21.05 kg/m  SpO2:  SpO2: 98 % O2 Device: O2 Device: Room Air O2 Flow Rate:    Intake/output summary:   Intake/Output Summary (Last 24 hours) at 06/19/2019 1956 Last data filed at 06/19/2019 1845 Gross per 24 hour  Intake 956.6 ml  Output 175 ml  Net 781.6 ml   LBM: Last BM Date: 06/18/19 Baseline Weight: Weight: 75.3 kg Most recent weight: Weight: 76.4 kg       Palliative Assessment/Data:    Flowsheet Rows     Most Recent Value  Intake Tab  Referral Department  Hospitalist  Unit at Time of Referral  Med/Surg Unit  Palliative Care Primary Diagnosis  Nephrology  Date Notified  06/17/19  Palliative Care Type  New Palliative care  Reason for referral  Clarify Goals of Care  Date of Admission  06/17/19  Date first seen by Palliative Care  06/18/19  # of days Palliative referral response time  1 Day(s)  # of days IP prior to Palliative referral  0  Clinical Assessment  Palliative Performance Scale Score  70%  Psychosocial & Spiritual Assessment  Palliative Care Outcomes  Patient/Family meeting held?  Yes  Who was at the meeting?  Patient      Patient Active Problem List   Diagnosis Date Noted  . Acute on chronic renal failure (Billy [name]) 06/17/2019  . Hepatorenal syndrome (Billy [name])-Type 2 06/14/2019  . Melena   . Rectal bleeding   . GI bleed 05/29/2019  . AMS (altered mental status) 05/28/2019  . Light headed 05/10/2019  . Chronic renal disease, stage III 04/02/2019  . Gastroesophageal reflux disease with esophagitis   . Acute deep vein thrombosis (DVT) of femoral vein of right lower extremity (Billy [name]) 03/30/2019  . Right leg DVT (Billy [name])   . Narcotic dependence (Billy [name]) 05/19/2017  . Secondary esophageal varices with bleeding (Billy [name])   . Leg pain, left 04/24/2016  .  Ascites 11/20/2015  . Scrotal edema 11/20/2015  . Hydrocele in adult 11/20/2015  . Alcohol dependence (Billy [name]) 09/26/2014  . Thrombocytopenia (Billy [name]) 09/26/2014  . Routine general medical examination at a health care facility 09/16/2012  . Alcoholic cirrhosis of liver (Billy [name]) 09/24/2011  . Portal hypertension (Billy [name]) 09/11/2011  . Angiodysplasia of colon 09/11/2011  . 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 Iron deficiency anemia due to chronic blood loss 09/05/2011  . Personal history of colonic polyps 09/05/2011  . DEGENERATIVE DISC DISEASE, LUMBAR SPINE 10/12/2007  . SLEEP DISORDER 10/12/2007  . Depression 08/06/2006  . Essential hypertension, benign 08/06/2006  . Portal hypertensive gastropathy (Billy [name]) 08/06/2006  . DIVERTICULOSIS, COLON 08/06/2006  . Chronic back pain greater than 3 months duration 08/06/2006    Palliative Care Assessment & Plan   Patient Profile: 73 y.o. male  with past medical history of liver cirrhosis with prior scrotal edema, esophageal varices, GI bleed, alcohol abuse, CKD, DVT (not on anticoagulation due to prior GI bleed) admitted on 06/17/2019 with concern for hepatorenal syndrome.  Palliative consulted for goals of care.   Assessment: Patient Active Problem List   Diagnosis Date Noted  . Acute on chronic renal failure (Billy [name]) 06/17/2019  . Hepatorenal syndrome (Billy [name])-Type 2 06/14/2019  . Melena   . Rectal bleeding   . GI bleed 05/29/2019  . AMS (altered mental status) 05/28/2019  . Light headed 05/10/2019  . Chronic renal disease, stage III 04/02/2019  . Gastroesophageal reflux disease with esophagitis   . Acute deep vein thrombosis (DVT) of femoral vein of right lower extremity (Billy [name]) 03/30/2019  . Right leg DVT (Billy [name])   .  Narcotic dependence (Billy [name]) 05/19/2017  . Secondary esophageal varices with bleeding (Billy [name])   . Leg pain, left 04/24/2016  . Ascites 11/20/2015  . Scrotal edema 11/20/2015  . Hydrocele in adult 11/20/2015  . Alcohol dependence (Billy [name]) 09/26/2014  .  Thrombocytopenia (Billy [name]) 09/26/2014  . Routine general medical examination at a health care facility 09/16/2012  . Alcoholic cirrhosis of liver (Billy [name]) 09/24/2011  . Portal hypertension (Billy [name]) 09/11/2011  . Angiodysplasia of colon 09/11/2011  . 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 Iron deficiency anemia due to chronic blood loss 09/05/2011  . Personal history of colonic polyps 09/05/2011  . DEGENERATIVE DISC DISEASE, LUMBAR SPINE 10/12/2007  . SLEEP DISORDER 10/12/2007  . Depression 08/06/2006  . Essential hypertension, benign 08/06/2006  . Portal hypertensive gastropathy ([place]) 08/06/2006  . DIVERTICULOSIS, COLON 08/06/2006  . Chronic back pain greater than 3 months duration 08/06/2006    Recommendations/Plan: - Full code/Full scope treatment - D/w Dr. Karleen Hampshire that he is now reporting that his abdominal pain is actually different than it has been in the past.  ? If we should pursue workup for SBP with hepatorenal syndrome, different abdominal pain, and now with WBC increased from 5.9 to 10. - His wife is his surrogate decision maker in the event he cannot make his own medical decisions. - He will ask his wife to bring in his living will for [place] to review together. - We continued conversations regarding [place] and severity of his condition.  He would like to see how he responds to current interventions and review his prior completed living will before further conversation regarding [place]. - Will plan to follow up tomorrow to continue conversation based on his clinical response to current interventions.  Goals of Care and Additional Recommendations:  Limitations on Scope of Treatment: Full Scope Treatment  Code Status:    Code Status Orders  (From admission, onward)         Start     Ordered   06/17/19 2148  Full code  Continuous     06/17/19 2150        Code Status History    Date Active Date Inactive Code Status Order ID Comments User Context   06/04/2019 1809 06/09/2019 1728 Full Code  [id number]  Flora Lipps, MD Inpatient   05/28/2019 1548 05/31/2019 2030 Full Code [id number]  Terrilee Croak, MD ED   03/30/2019 1549 03/31/2019 1816 Full Code [id number]  Nita Sells, MD Inpatient   04/13/2017 1540 04/16/2017 2227 Full Code [id number]  Charlesetta Garibaldi, MD Inpatient   01/06/2012 1548 01/10/2012 1511 Full Code [id number]  Grant Fontana, RN Inpatient   Advance Care Planning Activity    Advance Directive Documentation     Most Recent Value  Type of Advance Directive  Healthcare Power of Attorney, Living will  Pre-existing out of facility DNR order (yellow form or pink MOST form)  -  "MOST" Form in Place?  -       Prognosis:   Guarded  Discharge Planning:  To Be Determined  Care plan was discussed with patient, RN. Dr. Karleen Hampshire  Thank you for allowing the Palliative Medicine Team to assist in the care of this patient.   Total Time 30 Prolonged Time Billed No      Greater than 50%  of this time was spent counseling and coordinating care related to the above assessment and plan.  Micheline Rough, MD  Please contact Palliative Medicine Team phone at [phone] for questions and concerns.

## 2019-06-19 NOTE — Procedures (Signed)
Ultrasound-guided diagnostic and therapeutic paracentesis performed yielding 3.7 liters of light yellow fluid. No immediate complications. A portion of the fluid was sent to the lab for preordered studies. EBL < 2 cc.

## 2019-06-20 ENCOUNTER — Encounter (HOSPITAL_COMMUNITY): Payer: Self-pay

## 2019-06-20 LAB — GRAM STAIN

## 2019-06-20 LAB — BASIC METABOLIC PANEL
Anion gap: 13 (ref 5–15)
BUN: 55 mg/dL — ABNORMAL HIGH (ref 8–23)
CO2: 19 mmol/L — ABNORMAL LOW (ref 22–32)
Calcium: 8.5 mg/dL — ABNORMAL LOW (ref 8.9–10.3)
Chloride: 100 mmol/L (ref 98–111)
Creatinine, Ser: 3.22 mg/dL — ABNORMAL HIGH (ref 0.61–1.24)
GFR calc Af Amer: 21 mL/min — ABNORMAL LOW (ref >60)
GFR calc non Af Amer: 18 mL/min — ABNORMAL LOW (ref >60)
Glucose, Bld: 102 mg/dL — ABNORMAL HIGH (ref 70–99)
Potassium: 4.1 mmol/L (ref 3.5–5.1)
Sodium: 132 mmol/L — ABNORMAL LOW (ref 135–145)

## 2019-06-20 MED ORDER — LORAZEPAM 1 MG PO TABS
1.0000 mg | ORAL_TABLET | Freq: Three times a day (TID) | ORAL | Status: DC | PRN
  Administered 2019-06-21 – 2019-06-22 (×2): 1 mg via ORAL
  Filled 2019-06-20 (×2): qty 1

## 2019-06-20 NOTE — Progress Notes (Addendum)
PROGRESS NOTE    ZEPHANIAH SHEAHAN  [id number] DOB: 1946/01/30 DOA: 06/17/2019 PCP: [name], MD   Brief Narrative:  73 year old gentleman prior history of liver cirrhosis, right lower extremity DVT not on anticoagulation due to GI bleed, long standing history of alcohol use disorder, GERD, chronic diastolic heart failure history of peptic ulcer disease, gastritis, hypertension, thrombocytopenia, esophageal varices was admitted for evaluation of acute kidney injury and possible hepatorenal syndrome.  Patient currently denies any chest pain shortness of breath, nausea vomiting .  Patient reports some abdominal pain, suspicious for SBP.  Ultrasound paracentesis done on 12/5 and fluid sent for analysis.  Patient  empirically started on Rocephin for possible SBP. NO events overnight. Urine output decreased overnight.   Assessment & Plan:   Active Problems:   Acute on chronic renal failure (HCC)    Decompensated Liver cirrhosis with ascites and esophageal varices.  He appears to be fluid overloaded. Started him on IV lasix 80 mg BID.  ltrasound paracentesis ordered with fluid to be sent for analysis for concern for SBP as he reports slight worsening of abdominal pain. 3.7 lit of ascitic fluid drained yesterday, plan to continue with IV albumin daily .  Patient creatinine worsened to 3.2 today. Continue with octreotide and albumin. Appreciate  Continue with lactulose to prevent encephalopathy.      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 vs ATN vs fluid overload Baseline creatinine around 1.5 , on admission at 2.5, worsened to 3.2 today.  Ruled out UTI. US renal does not show any hydronephrosis.  Appreciate nephrology recommendations.  Started the patient on octreotide and albumin . Repeat renal parameters in am.   Mild metabolic acidosis On oral bicarb supplementation.  GERD/ PUD/ esophageal varices:  Pt denies any hematemesis or hematochezia: resume PPI and  sucralfate.    Right LE DVT:  Not on anti coagulation sec to GI bleed.     Anemia of chronic disease:  Pt denies any tarry stools or rectal bleeding.  Transfuse to keep hemoglobin greater than 7. Currently its at 7.7   Thrombocytopenia;  Chronic and secondary to liver disease.   No signs of bleeding.    Hyponatremia:  Possibly from fluid overload. Its at 132.   In view of the multiple medical problems. Worsening renal parameters , palliative care consulted for goals of care.  Appreciate palliative care input.   DVT prophylaxis: scd's Code Status: full code.  Family Communication: none at bedside.  Disposition Plan: pending clinical improvement.    Consultants:   Nephrology.  Palliative care  Procedures: Ultrasound paracentesis  Antimicrobials: none.    Subjective: Pt denies any chest pain or sob. No nausea, or vomiting abd pain and back pain improved with paracentesis.  Objective: Vitals:   06/19/19 2104 06/20/19 0540 06/20/19 0831 06/20/19 1211  BP: (!) 125/55 (!) 143/125  123/71  Pulse: 87 81  81  Resp: 16 16  14   Temp: 98.2 F (36.8 C) 98.5 F (36.9 C)  98.2 F (36.8 C)  TempSrc: Oral Oral  Oral  SpO2: 100% 99% 97% 100%  Weight:      Height:        Intake/Output Summary (Last 24 hours) at 06/20/2019 1254 Last data filed at 06/20/2019 1000 Gross per 24 hour  Intake 1274.45 ml  Output 175 ml  Net 1099.45 ml   Filed Weights   06/17/19 1836 06/18/19 0620  Weight: 75.3 kg 76.4 kg    Examination:  General exam:  Alert and comfortable Respiratory system: Air entry fair, no wheezing or rhonchi Cardiovascular system: S1-S2 heard, regular rate rhythm, trace pedal edema Gastrointestinal system: Abdomen is soft, distended tenderness improved bowel sounds normal Central nervous system: Alert and oriented Extremities:, Trace pedal edema, no cyanosis or clubbing Skin: No rashes Psychiatry: Appears depressed no suicidal or homicidal ideations.    Data  Reviewed: I have personally reviewed following labs and imaging studies  CBC: Recent Labs  Lab 06/14/19 1312 06/18/19 0534 06/19/19 0511  WBC 8.0 5.9 10.2  HGB 8.4* 7.9* 7.7*  HCT 27.2* 26.5* 25.4*  MCV 96.2 97.8 97.3  PLT 179.0 98* 95*   Basic Metabolic Panel: Recent Labs  Lab 06/16/19 1413 06/17/19 2234 06/18/19 0534 06/19/19 0511 06/20/19 0536  NA 129* 129* 129* 134* 132*  K 3.7 4.0 4.1 4.7 4.1  CL 102 101 100 102 100  CO2 18* 17* 20* 18* 19*  GLUCOSE 108* 102* 127* 84 102*  BUN 46* 48* 53* 51* 55*  CREATININE 2.52* 2.67* 2.84* 2.97* 3.22*  CALCIUM 8.2* 8.0* 8.1* 9.0 8.5*  MG  --  1.9  --   --   --   PHOS  --  4.5  --   --   --    GFR: Estimated Creatinine Clearance: 22.7 mL/min (A) (by C-G formula based on SCr of 3.22 mg/dL (H)). Liver Function Tests: Recent Labs  Lab 06/14/19 1312 06/17/19 2234  AST 28 36  ALT 22 27  ALKPHOS 107 112  BILITOT 0.6 0.8  PROT 5.4* 5.5*  ALBUMIN 2.3* 2.2*   No results for input(s): LIPASE, AMYLASE in the last 168 hours. No results for input(s): AMMONIA in the last 168 hours. Coagulation Profile: Recent Labs  Lab 06/14/19 1312  INR 1.3*   Cardiac Enzymes: No results for input(s): CKTOTAL, CKMB, CKMBINDEX, TROPONINI in the last 168 hours. BNP (last 3 results) No results for input(s): PROBNP in the last 8760 hours. HbA1C: No results for input(s): HGBA1C in the last 72 hours. CBG: No results for input(s): GLUCAP in the last 168 hours. Lipid Profile: No results for input(s): CHOL, HDL, LDLCALC, TRIG, CHOLHDL, LDLDIRECT in the last 72 hours. Thyroid Function Tests: Recent Labs    06/17/19 2234  TSH 6.511*   Anemia Panel: No results for input(s): VITAMINB12, FOLATE, FERRITIN, TIBC, IRON, RETICCTPCT in the last 72 hours. Sepsis Labs: No results for input(s): PROCALCITON, LATICACIDVEN in the last 168 hours.  Recent Results (from the past 240 hour(s))  SARS CORONAVIRUS 2 (TAT 6-24 HRS) Nasopharyngeal Nasopharyngeal  Swab     Status: None   Collection Time: 06/18/19  6:06 AM   Specimen: Nasopharyngeal Swab  Result Value Ref Range Status   SARS Coronavirus 2 NEGATIVE NEGATIVE Final    Comment: (NOTE) SARS-CoV-2 target nucleic acids are NOT DETECTED. The SARS-CoV-2 RNA is generally detectable in upper and lower respiratory specimens during the acute phase of infection. Negative results do not preclude SARS-CoV-2 infection, do not rule out co-infections with other pathogens, and should not be used as the sole basis for treatment or other patient management decisions. Negative results must be combined with clinical observations, patient history, and epidemiological information. The expected result is Negative. Fact Sheet for Patients: SugarRoll.be Fact Sheet for Healthcare Providers: https://www.woods-mathews.com/ This test is not yet approved or cleared by the [place] FDA and  has been authorized for detection and/or diagnosis of SARS-CoV-2 by FDA under an Emergency Use Authorization (EUA). This EUA will remain  in effect (meaning  this test can be used) for the duration of the COVID-19 declaration under Section 56 4(b)(1) of the Act, 21 U.S.C. section 360bbb-3(b)(1), unless the authorization is terminated or revoked sooner. Performed at [facility], [place] [address], [place], [place] [address]   Culture, body fluid-bottle     Status: None (Preliminary result)   Collection Time: 06/19/19  1:41 PM   Specimen: Fluid  Result Value Ref Range Status   Specimen Description FLUID  Final   Special Requests NONE  Final   Culture   Final    NO GROWTH < 24 HOURS Performed at [facility], [place] [address], [place], [place] [address]    Report Status PENDING  Incomplete  Gram stain     Status: None (Preliminary result)   Collection Time: 06/19/19  1:41 PM   Specimen: Fluid  Result Value Ref Range Status   Specimen Description FLUID  Final    Special Requests NONE  Final   Gram Stain   Final    CYTOSPIN SMEAR WBC PRESENT, PREDOMINANTLY MONONUCLEAR NO ORGANISMS SEEN Performed at [facility], [place] [address], [place], [place] [address]    Report Status PENDING  Incomplete         Radiology Studies: US Paracentesis  Result Date: 06/19/2019 INDICATION: Patient with history of alcoholic cirrhosis, chronic kidney disease, recurrent ascites, chronic diastolic heart failure; request received for diagnostic and therapeutic paracentesis. EXAM: ULTRASOUND GUIDED DIAGNOSTIC AND THERAPEUTIC PARACENTESIS MEDICATIONS: None COMPLICATIONS: None immediate. PROCEDURE: Informed written consent was obtained from the patient after a discussion of the risks, benefits and alternatives to treatment. A timeout was performed prior to the initiation of the procedure. Initial ultrasound scanning demonstrates a large amount of ascites within the right lower abdominal quadrant. The right lower abdomen was prepped and draped in the usual sterile fashion. 1% lidocaine was used for local anesthesia. Following this, a 19 gauge, 7-cm, Yueh catheter was introduced. An ultrasound image was saved for documentation purposes. The paracentesis was performed. The catheter was removed and a dressing was applied. The patient tolerated the procedure well without immediate post procedural complication. Patient received post-procedure intravenous albumin; see nursing notes for details. FINDINGS: A total of approximately 3.7 liters of light yellow fluid was removed. Samples were sent to the laboratory as requested by the clinical team. IMPRESSION: Successful ultrasound-guided diagnostic and therapeutic paracentesis yielding 3.7 liters of peritoneal fluid. Read by: Rowe Robert, PA-C Electronically Signed   By: Lucrezia Europe M.D.   On: 06/19/2019 14:15        Scheduled Meds: . buPROPion  150 mg Oral Daily  . ferrous sulfate  325 mg Oral QODAY  . furosemide  80 mg  Intravenous BID  . lactulose  20 g Oral Daily  . midodrine  7.5 mg Oral TID WC  . mirtazapine  45 mg Oral QHS  . multivitamin with minerals  1 tablet Oral Daily  . pantoprazole  40 mg Oral BID AC  . sodium bicarbonate  650 mg Oral TID  . sucralfate  1 g Oral TID BM  . vitamin C  250 mg Oral Daily   Continuous Infusions: . albumin human 50 g (06/20/19 1207)  . cefTRIAXone (ROCEPHIN)  IV Stopped (06/19/19 2248)  . octreotide  (SANDOSTATIN)    IV infusion 50 mcg/hr (06/20/19 0400)     LOS: 3 days        Hosie Poisson, MD [facility] 06/20/2019, 12:54 PM

## 2019-06-20 NOTE — Progress Notes (Addendum)
Daily Progress Note   Patient Name: Billy [name]       Date: 06/20/2019 DOB: 10-29-1945  Age: 73 y.o. MRN#: [id number] Attending Physician: Hosie Poisson, MD Primary Care Physician: [name], MD Admit Date: 06/17/2019  Reason for Consultation/Follow-up: Establishing goals of care  Subjective: I saw and examined Mr. Billy [name] today.    He understands that his Cr has continued to rise and that this is very concerning.  Reports that he is feeling fine physically, but "up and down" emotionally due to understanding the severity of his condition.  Discussed again regarding his overall clinical course and concerning nature of low urine output and rising Cr.  He reports having appointment with his PCP on Wednesday, and he is hopeful to be discharged to go to this appointment to discuss with Dr. Silvio [name].   Length of Stay: 3  Current Medications: Scheduled Meds:  . buPROPion  150 mg Oral Daily  . ferrous sulfate  325 mg Oral QODAY  . furosemide  80 mg Intravenous BID  . lactulose  20 g Oral Daily  . midodrine  7.5 mg Oral TID WC  . mirtazapine  45 mg Oral QHS  . multivitamin with minerals  1 tablet Oral Daily  . pantoprazole  40 mg Oral BID AC  . sodium bicarbonate  650 mg Oral TID  . sucralfate  1 g Oral TID BM  . vitamin C  250 mg Oral Daily    Continuous Infusions: . albumin human 50 g (06/20/19 1207)  . cefTRIAXone (ROCEPHIN)  IV 2 g (06/20/19 2023)  . octreotide  (SANDOSTATIN)    IV infusion 50 mcg/hr (06/20/19 1347)    PRN Meds: HYDROcodone-acetaminophen, LORazepam  Physical Exam         General: Alert, awake, in no acute distress.  HEENT: No bruits, no goiter, no JVD Heart: Regular rate and rhythm. No murmur appreciated. Lungs: Good air movement, clear Abdomen:  Soft, nontender, distended, positive bowel sounds.  Skin: Warm and dry Ext: + edema Neuro: Grossly intact, nonfocal.  Vital Signs: BP (!) 149/59 (BP Location: Right Arm)   Pulse 92   Temp 97.8 F (36.6 C) (Oral)   Resp 20   Ht 6\' 3"  (1.905 m)   Wt 76.4 kg   SpO2 100%   BMI 21.05 kg/m  SpO2: SpO2: 100 % O2 Device: O2 Device: Room Air O2 Flow Rate:    Intake/output summary:   Intake/Output Summary (Last 24 hours) at 06/20/2019 2206 Last data filed at 06/20/2019 1759 Gross per 24 hour  Intake 2164.45 ml  Output -  Net 2164.45 ml   LBM: Last BM Date: 06/18/19 Baseline Weight: Weight: 75.3 kg Most recent weight: Weight: 76.4 kg       Palliative Assessment/Data:    Flowsheet Rows     Most Recent Value  Intake Tab  Referral Department  Hospitalist  Unit at Time of Referral  Med/Surg Unit  Palliative Care Primary Diagnosis  Nephrology  Date Notified  06/17/19  Palliative Care Type  New Palliative care  Reason for referral  Clarify Goals of Care  Date of Admission  06/17/19  Date first seen by Palliative Care  06/18/19  # of days Palliative referral response time  1 Day(s)  # of days IP prior to Palliative referral  0  Clinical Assessment  Palliative Performance Scale Score  70%  Psychosocial & Spiritual Assessment  Palliative Care Outcomes  Patient/Family meeting held?  Yes  Who was at the meeting?  Patient      Patient Active Problem List   Diagnosis Date Noted  . Acute on chronic renal failure ([place]) 06/17/2019  . Hepatorenal syndrome (HCC)-Type 2 06/14/2019  . Melena   . Rectal bleeding   . GI bleed 05/29/2019  . AMS (altered mental status) 05/28/2019  . Light headed 05/10/2019  . Chronic renal disease, stage III 04/02/2019  . Gastroesophageal reflux disease with esophagitis   . Acute deep vein thrombosis (DVT) of femoral vein of right lower extremity ([place]) 03/30/2019  . Right leg DVT ([place])   . Narcotic dependence ([place]) 05/19/2017  . Secondary  esophageal varices with bleeding ([place])   . Leg pain, left 04/24/2016  . Ascites 11/20/2015  . Scrotal edema 11/20/2015  . Hydrocele in adult 11/20/2015  . Alcohol dependence ([place]) 09/26/2014  . Thrombocytopenia ([place]) 09/26/2014  . Routine general medical examination at a health care facility 09/16/2012  . Alcoholic cirrhosis of liver ([place]) 09/24/2011  . Portal hypertension ([place]) 09/11/2011  . Angiodysplasia of colon 09/11/2011  . 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 Iron deficiency anemia due to chronic blood loss 09/05/2011  . Personal history of colonic polyps 09/05/2011  . DEGENERATIVE DISC DISEASE, LUMBAR SPINE 10/12/2007  . SLEEP DISORDER 10/12/2007  . Depression 08/06/2006  . Essential hypertension, benign 08/06/2006  . Portal hypertensive gastropathy ([place]) 08/06/2006  . DIVERTICULOSIS, COLON 08/06/2006  . Chronic back pain greater than 3 months duration 08/06/2006    Palliative Care Assessment & Plan   Patient Profile: 73 y.o. male  with past medical history of liver cirrhosis with prior scrotal edema, esophageal varices, GI bleed, alcohol abuse, CKD, DVT (not on anticoagulation due to prior GI bleed) admitted on 06/17/2019 with concern for hepatorenal syndrome.  Palliative consulted for goals of care.   Assessment: Patient Active Problem List   Diagnosis Date Noted  . Acute on chronic renal failure ([name]) 06/17/2019  . Hepatorenal syndrome (HCC)-Type 2 06/14/2019  . Melena   . Rectal bleeding   . GI bleed 05/29/2019  . AMS (altered mental status) 05/28/2019  . Light headed 05/10/2019  . Chronic renal disease, stage III 04/02/2019  . Gastroesophageal reflux disease with esophagitis   . Acute deep vein thrombosis (DVT) of femoral vein of right lower extremity ([place]) 03/30/2019  . Right leg DVT ([place])   .  Narcotic dependence ([name]) 05/19/2017  . Secondary esophageal varices with bleeding ([name])   . Leg pain, left 04/24/2016  . Ascites 11/20/2015  . Scrotal edema 11/20/2015  .  Hydrocele in adult 11/20/2015  . Alcohol dependence ([place]) 09/26/2014  . Thrombocytopenia ([place]) 09/26/2014  . Routine general medical examination at a health care facility 09/16/2012  . Alcoholic cirrhosis of liver (Billy [name]) 09/24/2011  . Portal hypertension (Billy [name]) 09/11/2011  . Angiodysplasia of colon 09/11/2011  . 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 Iron deficiency anemia due to chronic blood loss 09/05/2011  . Personal history of colonic polyps 09/05/2011  . DEGENERATIVE DISC DISEASE, LUMBAR SPINE 10/12/2007  . SLEEP DISORDER 10/12/2007  . Depression 08/06/2006  . Essential hypertension, benign 08/06/2006  . Portal hypertensive gastropathy ([place]) 08/06/2006  . DIVERTICULOSIS, COLON 08/06/2006  . Chronic back pain greater than 3 months duration 08/06/2006    Recommendations/Plan: - Full code/Full scope treatment - His wife is his surrogate decision maker in the event he cannot make his own medical decisions. - We continued conversations regarding [place] and severity of his condition.  He would like to see how he responds to current interventions but understands that his situation is likely irreversible.  He places a lot of faith in Dr. Silvio [name], his PCP.  I offered to try to contact him tomorrow.  Mr. Sulkowski would like to be d/c to make his f/u appointment on Wednesday. - Will plan to follow up tomorrow to continue conversation based on his clinical response to current interventions.  Goals of Care and Additional Recommendations:  Limitations on Scope of Treatment: Full Scope Treatment  Code Status:    Code Status Orders  (From admission, onward)         Start     Ordered   06/17/19 2148  Full code  Continuous     06/17/19 2150        Code Status History    Date Active Date Inactive Code Status Order ID Comments User Context   06/04/2019 1809 06/09/2019 1728 Full Code [id number]  Flora Lipps, MD Inpatient   05/28/2019 1548 05/31/2019 2030 Full Code [id number]  Terrilee Croak, MD ED    03/30/2019 1549 03/31/2019 1816 Full Code [id number]  Nita Sells, MD Inpatient   04/13/2017 1540 04/16/2017 2227 Full Code [id number]  Charlesetta Garibaldi, MD Inpatient   01/06/2012 1548 01/10/2012 1511 Full Code [id number]  Grant Fontana, RN Inpatient   Advance Care Planning Activity    Advance Directive Documentation     Most Recent Value  Type of Advance Directive  Healthcare Power of Attorney, Living will  Pre-existing out of facility DNR order (yellow form or pink MOST form)  -  "MOST" Form in Place?  -       Prognosis:   Guarded  Discharge Planning:  To Be Determined  Care plan was discussed with patient, RN. Dr. Karleen Hampshire  Thank you for allowing the Palliative Medicine Team to assist in the care of this patient.   Total Time 30 Prolonged Time Billed No      Greater than 50%  of this time was spent counseling and coordinating care related to the above assessment and plan.  Micheline Rough, MD  Please contact Palliative Medicine Team phone at [phone] for questions and concerns.

## 2019-06-20 NOTE — Progress Notes (Signed)
[facility] ROUNDING NOTE   Subjective:   This is a 73 year old gentleman with a history of liver cirrhosis complicated by scrotal edema GI bleed alcohol abuse history of DVT but no longer able to tolerate anticoagulation therapy.  Is a history of chronic kidney disease and it appears that his baseline creatinine has oscillated around 1.4 to 1.8 mg/dL urine output 200 cc 12/4.  Did receive a CT scan with contrast administered 06/07/2019.    paracentesis was performed 05/30/2019 with removal of 3.2 L paracentesis performed 06/19/2019 with removal of 3.1 L  Blood pressure 140/125 pulse 82 temperature 98.4 O2 sats 97% room air.  Urine output 175 cc 06/19/2019  Sodium 132 potassium 4.1 chloride 100 CO2 19 BUN 35 creatinine 3.22 glucose 102 calcium 8.5 WBC 10.2 hemoglobin 7.7 platelets 95  Wellbutrin 150 mg daily, iron 325 mg every other day, Lasix 80 mg twice daily, lactulose 20 g daily, midodrine 7.5 mg 3 times daily, Remeron 45 mg nightly, multivitamins 1 daily Protonix 40 mg daily sodium bicarbonate 650 mg twice daily, Carafate 1 g 3 times daily.  IV albumin 50 g daily.  Octreotide 50 mcg IV continuous infusion IV Rocephin 2 g every 24 hours   Objective:  Vital signs in last 24 hours:  Temp:  [98.2 F (36.8 C)-98.5 F (36.9 C)] 98.5 F (36.9 C) (12/06 0540) Pulse Rate:  [81-96] 81 (12/06 0540) Resp:  [16-17] 16 (12/06 0540) BP: (125-144)/(55-125) 143/125 (12/06 0540) SpO2:  [97 %-100 %] 97 % (12/06 0831)  Weight change:  Filed Weights   06/17/19 1836 06/18/19 0620  Weight: 75.3 kg 76.4 kg    Intake/Output: I/O last 3 completed shifts: In: 1629.5 [P.O.:480; I.V.:1050.7; IV Piggyback:98.7] Out: 175 [Urine:175]   Intake/Output this shift:  Total I/O In: 240 [P.O.:240] Out: -   General: Calm comfortable cachectic nondistressed HEENT: Normocephalic atraumatic oropharynx was clear nasal mucosa clear Eyes: Extraocular movements are intact Neck: No thyromegaly JVP  not elevated neck was supple Heart: Regular rate and rhythm no murmurs rubs gallops bilateral lower extremity edema Lungs: No wheezes rales Abdomen: Soft nontender bowel sounds nondistended ascites present Extremities: 3+ pitting edema Skin: Nonicteric no bruises Neuro: Alert oriented   Basic Metabolic Panel: Recent Labs  Lab 06/16/19 1413 06/17/19 2234 06/18/19 0534 06/19/19 0511 06/20/19 0536  NA 129* 129* 129* 134* 132*  K 3.7 4.0 4.1 4.7 4.1  CL 102 101 100 102 100  CO2 18* 17* 20* 18* 19*  GLUCOSE 108* 102* 127* 84 102*  BUN 46* 48* 53* 51* 55*  CREATININE 2.52* 2.67* 2.84* 2.97* 3.22*  CALCIUM 8.2* 8.0* 8.1* 9.0 8.5*  MG  --  1.9  --   --   --   PHOS  --  4.5  --   --   --     Liver Function Tests: Recent Labs  Lab 06/14/19 1312 06/17/19 2234  AST 28 36  ALT 22 27  ALKPHOS 107 112  BILITOT 0.6 0.8  PROT 5.4* 5.5*  ALBUMIN 2.3* 2.2*   No results for input(s): LIPASE, AMYLASE in the last 168 hours. No results for input(s): AMMONIA in the last 168 hours.  CBC: Recent Labs  Lab 06/14/19 1312 06/18/19 0534 06/19/19 0511  WBC 8.0 5.9 10.2  HGB 8.4* 7.9* 7.7*  HCT 27.2* 26.5* 25.4*  MCV 96.2 97.8 97.3  PLT 179.0 98* 95*    Cardiac Enzymes: No results for input(s): CKTOTAL, CKMB, CKMBINDEX, TROPONINI in the last 168 hours.  BNP:  Invalid input(s): POCBNP  CBG: No results for input(s): GLUCAP in the last 168 hours.  Microbiology: Results for orders placed or performed during the hospital encounter of 06/17/19  SARS CORONAVIRUS 2 (TAT 6-24 HRS) Nasopharyngeal Nasopharyngeal Swab     Status: None   Collection Time: 06/18/19  6:06 AM   Specimen: Nasopharyngeal Swab  Result Value Ref Range Status   SARS Coronavirus 2 NEGATIVE NEGATIVE Final    Comment: (NOTE) SARS-CoV-2 target nucleic acids are NOT DETECTED. The SARS-CoV-2 RNA is generally detectable in upper and lower respiratory specimens during the acute phase of infection. Negative results do  not preclude SARS-CoV-2 infection, do not rule out co-infections with other pathogens, and should not be used as the sole basis for treatment or other patient management decisions. Negative results must be combined with clinical observations, patient history, and epidemiological information. The expected result is Negative. Fact Sheet for Patients: SugarRoll.be Fact Sheet for Healthcare Providers: https://www.woods-mathews.com/ This test is not yet approved or cleared by the [place] FDA and  has been authorized for detection and/or diagnosis of SARS-CoV-2 by FDA under an Emergency Use Authorization (EUA). This EUA will remain  in effect (meaning this test can be used) for the duration of the COVID-19 declaration under Section 56 4(b)(1) of the Act, 21 U.S.C. section 360bbb-3(b)(1), unless the authorization is terminated or revoked sooner. Performed at [facility], [place] [address] [place], [place] [address]   Culture, body fluid-bottle     Status: None (Preliminary result)   Collection Time: 06/19/19  1:41 PM   Specimen: Fluid  Result Value Ref Range Status   Specimen Description FLUID  Final   Special Requests NONE  Final   Culture   Final    NO GROWTH < 24 HOURS Performed at [facility], [place] [address] [place], [place] [address]    Report Status PENDING  Incomplete  Gram stain     Status: None (Preliminary result)   Collection Time: 06/19/19  1:41 PM   Specimen: Fluid  Result Value Ref Range Status   Specimen Description FLUID  Final   Special Requests NONE  Final   Gram Stain   Final    CYTOSPIN SMEAR WBC PRESENT, PREDOMINANTLY MONONUCLEAR NO ORGANISMS SEEN Performed at [facility], [place] [address], [place], [place] [address]    Report Status PENDING  Incomplete    Coagulation Studies: No results for input(s): LABPROT, INR in the last 72 hours.  Urinalysis: Recent Labs    06/18/19 0746   COLORURINE YELLOW  LABSPEC 1.018  PHURINE 5.0  GLUCOSEU NEGATIVE  HGBUR NEGATIVE  BILIRUBINUR NEGATIVE  KETONESUR NEGATIVE  PROTEINUR NEGATIVE  NITRITE NEGATIVE  LEUKOCYTESUR NEGATIVE      Imaging: US Paracentesis  Result Date: 06/19/2019 INDICATION: Patient with history of alcoholic cirrhosis, chronic kidney disease, recurrent ascites, chronic diastolic heart failure; request received for diagnostic and therapeutic paracentesis. EXAM: ULTRASOUND GUIDED DIAGNOSTIC AND THERAPEUTIC PARACENTESIS MEDICATIONS: None COMPLICATIONS: None immediate. PROCEDURE: Informed written consent was obtained from the patient after a discussion of the risks, benefits and alternatives to treatment. A timeout was performed prior to the initiation of the procedure. Initial ultrasound scanning demonstrates a large amount of ascites within the right lower abdominal quadrant. The right lower abdomen was prepped and draped in the usual sterile fashion. 1% lidocaine was used for local anesthesia. Following this, a 19 gauge, 7-cm, Yueh catheter was introduced. An ultrasound image was saved for documentation purposes. The paracentesis was  performed. The catheter was removed and a dressing was applied. The patient tolerated the procedure well without immediate post procedural complication. Patient received post-procedure intravenous albumin; see nursing notes for details. FINDINGS: A total of approximately 3.7 liters of light yellow fluid was removed. Samples were sent to the laboratory as requested by the clinical team. IMPRESSION: Successful ultrasound-guided diagnostic and therapeutic paracentesis yielding 3.7 liters of peritoneal fluid. Read by: Rowe Robert, PA-C Electronically Signed   By: Lucrezia Europe M.D.   On: 06/19/2019 14:15     Medications:   . albumin human 50 g (06/19/19 1028)  . cefTRIAXone (ROCEPHIN)  IV Stopped (06/19/19 2248)  . octreotide  (SANDOSTATIN)    IV infusion 50 mcg/hr (06/20/19 0400)   .  buPROPion  150 mg Oral Daily  . ferrous sulfate  325 mg Oral QODAY  . furosemide  80 mg Intravenous BID  . lactulose  20 g Oral Daily  . midodrine  7.5 mg Oral TID WC  . mirtazapine  45 mg Oral QHS  . multivitamin with minerals  1 tablet Oral Daily  . pantoprazole  40 mg Oral BID AC  . sodium bicarbonate  650 mg Oral TID  . sucralfate  1 g Oral TID BM  . vitamin C  250 mg Oral Daily   HYDROcodone-acetaminophen  Assessment/ Plan:  1.Chronic kidney disease stage III baseline urinalysis bland renal ultrasound showed echogenic kidneys consistent with medical disease no hydronephrosis.  No nephrotoxins 2.  Acute kidney injury appears to be nonoliguric urine sodium less than 10.  Did receive IV contrast 06/07/2019.  Paracentesis 05/30/2019 with removal of 3.2 L.  Inactive urine sediment.  Negative for protein.  Possibly acute tubular necrosis.  No hemodynamic instability noted at this time.  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  There appears to be little more to do at this point as renal function continues to worsen.    3.  Hyponatremia urine sodium less than 10.  Volume overloaded consistent with cirrhosis. 4.  Metabolic acidosis is mild increased sodium bicarbonate to 650 mg 3 times daily 5.  Hypertension/volume avoid nonsteroidal anti-inflammatory drugs ACE inhibitors ARB use.  The use of diuretics for treatment of his lower extremity edema may worsen his renal function.  Will give 80 mg IV Lasix twice daily. 6.  Disposition appreciate assistance from palliative medicine    LOS: [place] @TODAY @10 :00 AM

## 2019-06-21 ENCOUNTER — Telehealth: Payer: Self-pay | Admitting: Internal Medicine

## 2019-06-21 LAB — BASIC METABOLIC PANEL
Anion gap: 13 (ref 5–15)
BUN: 52 mg/dL — ABNORMAL HIGH (ref 8–23)
CO2: 20 mmol/L — ABNORMAL LOW (ref 22–32)
Calcium: 8.4 mg/dL — ABNORMAL LOW (ref 8.9–10.3)
Chloride: 102 mmol/L (ref 98–111)
Creatinine, Ser: 2.93 mg/dL — ABNORMAL HIGH (ref 0.61–1.24)
GFR calc Af Amer: 24 mL/min — ABNORMAL LOW (ref >60)
GFR calc non Af Amer: 21 mL/min — ABNORMAL LOW (ref >60)
Glucose, Bld: 111 mg/dL — ABNORMAL HIGH (ref 70–99)
Potassium: 3.5 mmol/L (ref 3.5–5.1)
Sodium: 135 mmol/L (ref 135–145)

## 2019-06-21 NOTE — Telephone Encounter (Signed)
Billy [name] We will probably consult palliative care upon going home---I am truly hopeful that he is not ready for hospice yet

## 2019-06-21 NOTE — Progress Notes (Signed)
PROGRESS NOTE    Billy [name]  [id number] DOB: 1945-10-17 DOA: 06/17/2019 PCP: [name], MD   Brief Narrative:  73 year old gentleman prior history of liver cirrhosis, right lower extremity DVT not on anticoagulation due to GI bleed, long standing history of alcohol use disorder, GERD, chronic diastolic heart failure history of peptic ulcer disease, gastritis, hypertension, thrombocytopenia, esophageal varices was admitted for evaluation of acute kidney injury and possible hepatorenal syndrome.  Patient currently denies any chest pain shortness of breath, nausea vomiting .  Patient reports some abdominal pain, suspicious for SBP.  Ultrasound paracentesis done on 12/5 and fluid sent for analysis.  Patient  empirically started on Rocephin for possible SBP. Pt tearful of his prognosis, requesting for anti anxiety medications. He agreed to stay one more night for diuresis and will recheck renal parameters in am. He is requesting to be discharged tomorrow. Unfortunately nothing to add further at this time.   Assessment & Plan:   Active Problems:   Acute on chronic renal failure (HCC)    Decompensated Liver cirrhosis with ascites and esophageal varices.  Fluid overloaded on admission, improving . Patient was  started him on IV lasix 80 mg BID, along with one time paracentesis on 12/5 and fluid sent for analysis. So far the cultures have been negative.  He was empirically started on IV rocephin for SBP, . Pt denies any nausea, vomiting and abd pain. Continue with octreotide and albumin and midodrine. Continue with lactulose to prevent encephalopathy.      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 vs ATN vs fluid overload Baseline creatinine around 1.5 , on admission at 2.5, worsened to 3.2 , started improving to 2.9 today.   Ruled out UTI. US renal does not show any hydronephrosis.  Nothing further to add.  Started the patient on octreotide and albumin, and midodrine.     Mild metabolic acidosis On oral bicarb supplementation. Bicarb is 20.   GERD/ PUD/ esophageal varices:  Pt denies any hematemesis or hematochezia: resume PPI and sucralfate.    Right LE DVT:  Not on anti coagulation sec to GI bleed.     Anemia of chronic disease:  Pt denies any tarry stools or rectal bleeding.  Transfuse to keep hemoglobin greater than 7.  Repeat cbc in am.    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   No signs of bleeding.    Hyponatremia:  Possibly from fluid overload. Improving with diuresis.    In view of the multiple medical problems. Worsening renal parameters , poor functional status,  palliative care consulted for goals of care. He wanted to be DNR.  Pt would like to be discharged tomorrow and wanted to follow up with PCp on Wednesday.    DVT prophylaxis: scd's Code Status: full code.  Family Communication: none at bedside.  Disposition Plan: possible d/c home in 1 to 2 days.    Consultants:   Nephrology.    Palliative care  Procedures: Ultrasound paracentesis  Antimicrobials: none.    Subjective: Teary about his poor prognosis.  Would like to follow up with PCP as outpatient.  Requesting his diet to be changed to regular diet.  Requesting an ti anxiety medications on discharge.  Objective: Vitals:   06/20/19 2054 06/20/19 2054 06/21/19 0621 06/21/19 1329  BP: (!) 149/59 (!) 149/59 109/65 122/64  Pulse: 95 92 94 90  Resp: 20 20 16 20   Temp: 97.8 F (36.6 C) 97.8 F (36.6 C) 98.2  F (36.8 C) 98 F (36.7 C)  TempSrc: Oral Oral Oral Oral  SpO2: 99% 100% 99% 100%  Weight:      Height:        Intake/Output Summary (Last 24 hours) at 06/21/2019 1849 Last data filed at 06/21/2019 1612 Gross per 24 hour  Intake 1877.92 ml  Output 450 ml  Net 1427.92 ml   Filed Weights   06/17/19 1836 06/18/19 0620  Weight: 75.3 kg 76.4 kg    Examination:  General exam: alert and not in distress, sitting in the  chair.  Respiratory system: air entry fair, no wheezing or rhonchi.  Cardiovascular system: S1S2, RRR, pedal edema improving.  Gastrointestinal system: abd is soft, distended, tenderness improved from 3 days ago. Bowel sounds wnl.  Central nervous system: alert and oriented, grossly non focal.  Extremities:, improving pedal edema.  Skin: no rashes or ulcers.  Psychiatry: anxious today.     Data Reviewed: I have personally reviewed following labs and imaging studies  CBC: Recent Labs  Lab 06/18/19 0534 06/19/19 0511  WBC 5.9 10.2  HGB 7.9* 7.7*  HCT 26.5* 25.4*  MCV 97.8 97.3  PLT 98* 95*   Basic Metabolic Panel: Recent Labs  Lab 06/17/19 2234 06/18/19 0534 06/19/19 0511 06/20/19 0536 06/21/19 0502  NA 129* 129* 134* 132* 135  K 4.0 4.1 4.7 4.1 3.5  CL 101 100 102 100 102  CO2 17* 20* 18* 19* 20*  GLUCOSE 102* 127* 84 102* 111*  BUN 48* 53* 51* 55* 52*  CREATININE 2.67* 2.84* 2.97* 3.22* 2.93*  CALCIUM 8.0* 8.1* 9.0 8.5* 8.4*  MG 1.9  --   --   --   --   PHOS 4.5  --   --   --   --    GFR: Estimated Creatinine Clearance: 25 mL/min (A) (by C-G formula based on SCr of 2.93 mg/dL (H)). Liver Function Tests: Recent Labs  Lab 06/17/19 2234  AST 36  ALT 27  ALKPHOS 112  BILITOT 0.8  PROT 5.5*  ALBUMIN 2.2*   No results for input(s): LIPASE, AMYLASE in the last 168 hours. No results for input(s): AMMONIA in the last 168 hours. Coagulation Profile: No results for input(s): INR, PROTIME in the last 168 hours. Cardiac Enzymes: No results for input(s): CKTOTAL, CKMB, CKMBINDEX, TROPONINI in the last 168 hours. BNP (last 3 results) No results for input(s): PROBNP in the last 8760 hours. HbA1C: No results for input(s): HGBA1C in the last 72 hours. CBG: No results for input(s): GLUCAP in the last 168 hours. Lipid Profile: No results for input(s): CHOL, HDL, LDLCALC, TRIG, CHOLHDL, LDLDIRECT in the last 72 hours. Thyroid Function Tests: No results for input(s):  TSH, T4TOTAL, FREET4, T3FREE, THYROIDAB in the last 72 hours. Anemia Panel: No results for input(s): VITAMINB12, FOLATE, FERRITIN, TIBC, IRON, RETICCTPCT in the last 72 hours. Sepsis Labs: No results for input(s): PROCALCITON, LATICACIDVEN in the last 168 hours.  Recent Results (from the past 240 hour(s))  SARS CORONAVIRUS 2 (TAT 6-24 HRS) Nasopharyngeal Nasopharyngeal Swab     Status: None   Collection Time: 06/18/19  6:06 AM   Specimen: Nasopharyngeal Swab  Result Value Ref Range Status   SARS Coronavirus 2 NEGATIVE NEGATIVE Final    Comment: (NOTE) SARS-CoV-2 target nucleic acids are NOT DETECTED. The SARS-CoV-2 RNA is generally detectable in upper and lower respiratory specimens during the acute phase of infection. Negative results do not preclude SARS-CoV-2 infection, do not rule out co-infections with other pathogens,  and should not be used as the sole basis for treatment or other patient management decisions. Negative results must be combined with clinical observations, patient history, and epidemiological information. The expected result is Negative. Fact Sheet for Patients: SugarRoll.be Fact Sheet for Healthcare Providers: https://www.woods-mathews.com/ This test is not yet approved or cleared by the [place] FDA and  has been authorized for detection and/or diagnosis of SARS-CoV-2 by FDA under an Emergency Use Authorization (EUA). This EUA will remain  in effect (meaning this test can be used) for the duration of the COVID-19 declaration under Section 56 4(b)(1) of the Act, 21 U.S.C. section 360bbb-3(b)(1), unless the authorization is terminated or revoked sooner. Performed at [facility], [place] [address], [place], [place] [address]   Culture, body fluid-bottle     Status: None (Preliminary result)   Collection Time: 06/19/19  1:41 PM   Specimen: Fluid  Result Value Ref Range Status   Specimen Description FLUID   Final   Special Requests NONE  Final   Culture   Final    NO GROWTH 2 DAYS Performed at [place] [address], [place], [place] [address]    Report Status PENDING  Incomplete  Gram stain     Status: None   Collection Time: 06/19/19  1:41 PM   Specimen: Fluid  Result Value Ref Range Status   Specimen Description FLUID  Final   Special Requests NONE  Final   Gram Stain   Final    CYTOSPIN SMEAR WBC PRESENT, PREDOMINANTLY MONONUCLEAR NO ORGANISMS SEEN Performed at [facility], [place] [address], [place], [place] [address]    Report Status 06/20/2019 FINAL  Final         Radiology Studies: No results found.      Scheduled Meds: . buPROPion  150 mg Oral Daily  . ferrous sulfate  325 mg Oral QODAY  . lactulose  20 g Oral Daily  . midodrine  7.5 mg Oral TID WC  . mirtazapine  45 mg Oral QHS  . multivitamin with minerals  1 tablet Oral Daily  . pantoprazole  40 mg Oral BID AC  . sodium bicarbonate  650 mg Oral TID  . sucralfate  1 g Oral TID BM  . vitamin C  250 mg Oral Daily   Continuous Infusions: . albumin human Stopped (06/21/19 1343)  . cefTRIAXone (ROCEPHIN)  IV Stopped (06/20/19 2120)  . octreotide  (SANDOSTATIN)    IV infusion 50 mcg/hr (06/21/19 1612)     LOS: 4 days        Hosie Poisson, MD [facility] 06/21/2019, 6:49 PM

## 2019-06-21 NOTE — Progress Notes (Addendum)
[facility] ROUNDING NOTE   Subjective:   he has been on midodrine, albumin, and octreotide.  Strict ins/outs not available but 4 urine voids charted on 12/6 with 200 and an additional void thus far today.  He received lasix 80 mg IV this AM.  He would like to go home soon - still is listed as full code and when I asked him about his goals he became tearful - states "don't want to die here".   Review of systems:  No nausea or vomiting  No shortness of breath  No chest pain  No trouble urinating - going a lot he states    Background:  This is a 73 year old gentleman with a history of liver cirrhosis complicated by scrotal edema GI bleed alcohol abuse history of DVT but no longer able to tolerate anticoagulation therapy.  Is a history of chronic kidney disease and it appears that his baseline creatinine has oscillated around 1.4 to 1.8 mg/dL urine output 200 cc 12/4.  Did receive a CT scan with contrast administered 06/07/2019.    paracentesis was performed 05/30/2019 with removal of 3.2 L paracentesis performed 06/19/2019 with removal of 3.1 L.        Objective:  Vital signs in last 24 hours:  Temp:  [97.8 F (36.6 C)-98.2 F (36.8 C)] 98.2 F (36.8 C) (12/07 0621) Pulse Rate:  [81-95] 94 (12/07 0621) Resp:  [14-20] 16 (12/07 0621) BP: (109-149)/(59-71) 109/65 (12/07 0621) SpO2:  [99 %-100 %] 99 % (12/07 0621)  Weight change:  Filed Weights   06/17/19 1836 06/18/19 0620  Weight: 75.3 kg 76.4 kg    Intake/Output: I/O last 3 completed shifts: In: 2404.5 [P.O.:1850; I.V.:455.7; IV Piggyback:98.7] Out: -    Intake/Output this shift:  Total I/O In: 480 [P.O.:480] Out: 200 [Urine:200]  General: Calm comfortable cachectic NAD HEENT: Normocephalic atraumatic oropharynx was clear nasal mucosa clear Eyes: Extraocular movements are intact Neck: JVP not elevated; supple Heart: Regular rate and rhythm no rub  Lungs: No wheezes rales; unlabored at rest Abdomen: Soft  nontender to palpation  Extremities: 1-2+ pitting edema Skin: Nonicteric no bruises Neuro: Alert oriented x 3   Basic Metabolic Panel: Recent Labs  Lab 06/17/19 2234 06/18/19 0534 06/19/19 0511 06/20/19 0536 06/21/19 0502  NA 129* 129* 134* 132* 135  K 4.0 4.1 4.7 4.1 3.5  CL 101 100 102 100 102  CO2 17* 20* 18* 19* 20*  GLUCOSE 102* 127* 84 102* 111*  BUN 48* 53* 51* 55* 52*  CREATININE 2.67* 2.84* 2.97* 3.22* 2.93*  CALCIUM 8.0* 8.1* 9.0 8.5* 8.4*  MG 1.9  --   --   --   --   PHOS 4.5  --   --   --   --     Liver Function Tests: Recent Labs  Lab 06/14/19 1312 06/17/19 2234  AST 28 36  ALT 22 27  ALKPHOS 107 112  BILITOT 0.6 0.8  PROT 5.4* 5.5*  ALBUMIN 2.3* 2.2*   No results for input(s): LIPASE, AMYLASE in the last 168 hours. No results for input(s): AMMONIA in the last 168 hours.  CBC: Recent Labs  Lab 06/14/19 1312 06/18/19 0534 06/19/19 0511  WBC 8.0 5.9 10.2  HGB 8.4* 7.9* 7.7*  HCT 27.2* 26.5* 25.4*  MCV 96.2 97.8 97.3  PLT 179.0 98* 95*    Cardiac Enzymes: No results for input(s): CKTOTAL, CKMB, CKMBINDEX, TROPONINI in the last 168 hours.  Microbiology: Results for orders placed or performed during  the hospital encounter of 06/17/19  SARS CORONAVIRUS 2 (TAT 6-24 HRS) Nasopharyngeal Nasopharyngeal Swab     Status: None   Collection Time: 06/18/19  6:06 AM   Specimen: Nasopharyngeal Swab  Result Value Ref Range Status   SARS Coronavirus 2 NEGATIVE NEGATIVE Final    Comment: (NOTE) SARS-CoV-2 target nucleic acids are NOT DETECTED. The SARS-CoV-2 RNA is generally detectable in upper and lower respiratory specimens during the acute phase of infection. Negative results do not preclude SARS-CoV-2 infection, do not rule out co-infections with other pathogens, and should not be used as the sole basis for treatment or other patient management decisions. Negative results must be combined with clinical observations, patient history, and  epidemiological information. The expected result is Negative. Fact Sheet for Patients: SugarRoll.be Fact Sheet for Healthcare Providers: https://www.woods-mathews.com/ This test is not yet approved or cleared by the [place] FDA and  has been authorized for detection and/or diagnosis of SARS-CoV-2 by FDA under an Emergency Use Authorization (EUA). This EUA will remain  in effect (meaning this test can be used) for the duration of the COVID-19 declaration under Section 56 4(b)(1) of the Act, 21 U.S.C. section 360bbb-3(b)(1), unless the authorization is terminated or revoked sooner. Performed at [facility], [place] [address], [place], [place] [address]   Culture, body fluid-bottle     Status: None (Preliminary result)   Collection Time: 06/19/19  1:41 PM   Specimen: Fluid  Result Value Ref Range Status   Specimen Description FLUID  Final   Special Requests NONE  Final   Culture   Final    NO GROWTH < 24 HOURS Performed at [facility], [place] [address], [place], [place] [address]    Report Status PENDING  Incomplete  Gram stain     Status: None   Collection Time: 06/19/19  1:41 PM   Specimen: Fluid  Result Value Ref Range Status   Specimen Description FLUID  Final   Special Requests NONE  Final   Gram Stain   Final    CYTOSPIN SMEAR WBC PRESENT, PREDOMINANTLY MONONUCLEAR NO ORGANISMS SEEN Performed at [facility], [place] [address] [place], [place] [address]    Report Status 06/20/2019 FINAL  Final    Coagulation Studies: No results for input(s): LABPROT, INR in the last 72 hours.  Urinalysis: No results for input(s): COLORURINE, LABSPEC, PHURINE, GLUCOSEU, HGBUR, BILIRUBINUR, KETONESUR, PROTEINUR, UROBILINOGEN, NITRITE, LEUKOCYTESUR in the last 72 hours.  Invalid input(s): APPERANCEUR    Imaging: US Paracentesis  Result Date: 06/19/2019 INDICATION: Patient with history of alcoholic cirrhosis, chronic  kidney disease, recurrent ascites, chronic diastolic heart failure; request received for diagnostic and therapeutic paracentesis. EXAM: ULTRASOUND GUIDED DIAGNOSTIC AND THERAPEUTIC PARACENTESIS MEDICATIONS: None COMPLICATIONS: None immediate. PROCEDURE: Informed written consent was obtained from the patient after a discussion of the risks, benefits and alternatives to treatment. A timeout was performed prior to the initiation of the procedure. Initial ultrasound scanning demonstrates a large amount of ascites within the right lower abdominal quadrant. The right lower abdomen was prepped and draped in the usual sterile fashion. 1% lidocaine was used for local anesthesia. Following this, a 19 gauge, 7-cm, Yueh catheter was introduced. An ultrasound image was saved for documentation purposes. The paracentesis was performed. The catheter was removed and a dressing was applied. The patient tolerated the procedure well without immediate post procedural complication. Patient received post-procedure intravenous albumin; see nursing notes for details. FINDINGS: A total of approximately 3.7 liters of light  yellow fluid was removed. Samples were sent to the laboratory as requested by the clinical team. IMPRESSION: Successful ultrasound-guided diagnostic and therapeutic paracentesis yielding 3.7 liters of peritoneal fluid. Read by: Rowe Robert, PA-C Electronically Signed   By: Lucrezia Europe M.D.   On: 06/19/2019 14:15     Medications:   . albumin human 50 g (06/21/19 1001)  . cefTRIAXone (ROCEPHIN)  IV 2 g (06/20/19 2023)  . octreotide  (SANDOSTATIN)    IV infusion 50 mcg/hr (06/21/19 0107)   . buPROPion  150 mg Oral Daily  . ferrous sulfate  325 mg Oral QODAY  . furosemide  80 mg Intravenous BID  . lactulose  20 g Oral Daily  . midodrine  7.5 mg Oral TID WC  . mirtazapine  45 mg Oral QHS  . multivitamin with minerals  1 tablet Oral Daily  . pantoprazole  40 mg Oral BID AC  . sodium bicarbonate  650 mg Oral TID   . sucralfate  1 g Oral TID BM  . vitamin C  250 mg Oral Daily   HYDROcodone-acetaminophen, LORazepam  Assessment/ Plan:  1.  Acute kidney injury appears to be nonoliguric urine sodium less than 10.  Did receive IV contrast 06/07/2019.  Paracentesis 05/30/2019 with removal of 3.2 L.  Inactive urine sediment.  Negative for protein.  Possibly acute tubular necrosis.  No hemodynamic instability noted at this time.  Would be consistent with hepatorenal syndrome - agree with octreotide and albumin therapy and midodrine - ordered strict ins/outs  - discontinue scheduled lasix for now - received a dose already today - would transition to renal diet  - If goals of care change to comfort would need lasix PO on discharge  - Spoke with palliative - MD is going to see him now  2.Chronic kidney disease stage III baseline urinalysis bland renal ultrasound showed echogenic kidneys consistent with medical disease no hydronephrosis.  No nephrotoxins  3.  Hyponatremia urine sodium less than 10.  Resolved.  Volume overload setting of cirrhosis.  4.  Metabolic acidosis - on sodium bicarbonate 650 mg 3 times daily  5.  Hypertension/volume avoid nonsteroidal anti-inflammatory drugs ACE inhibitors ARB use.  The use of diuretics for treatment of his lower extremity edema may worsen his renal function.  - discontinue scheduled lasix for now    6. Anemia - update CBC in AM.  Setting of cirrhosis, AKI on CKD   6.  Disposition appreciate assistance from palliative medicine    Claudia Desanctis 06/21/2019  10:12 AM

## 2019-06-21 NOTE — Progress Notes (Signed)
Daily Progress Note   Patient Name: Billy [name]       Date: 06/21/2019 DOB: 01-10-1946  Age: 73 y.o. MRN#: [id number] Attending Physician: Hosie Poisson, MD Primary Care Physician: [name], MD Admit Date: 06/17/2019  Reason for Consultation/Follow-up: Establishing goals of care  Subjective: I saw and examined Mr. Billy [name] today.    We met on 2 occasions.  Once in the AM, and again after he spoke with his PCP, Dr. Silvio [name].  He understands that his Cr has continued to rise and that this is very concerning.  Reports that he is feeling fine physically, which makes it more difficult for him to accept the severity of his condition.  Discussed again regarding his overall clinical course.  While he has had slight decrease in his Cr and increase in UOP, his overall prognosis remains poor.  We discussed that in light of multiple chronic medical problems that have worsened with this acute problem, care should be focused on interventions that are likely to allow him to achieve goal of getting back to home and spending time with family. I discussed with him regarding heroic interventions at the end-of-life and he agrees this would not be in line with prior expressed wishes for a natural death or be likely to lead to getting well enough to go back home. He was in agreement with changing CODE STATUS to DO NOT RESUSCITATE.  After considering options and speaking with his PCP, he would like to work to get out of the hospital tomorrow and see Dr. Silvio [name] for follow-up.  I discussed palliative care following him on discharge and he is agreeable to this.  Length of Stay: 4  Current Medications: Scheduled Meds:   buPROPion  150 mg Oral Daily   ferrous sulfate  325 mg Oral QODAY   lactulose  20 g  Oral Daily   midodrine  7.5 mg Oral TID WC   mirtazapine  45 mg Oral QHS   multivitamin with minerals  1 tablet Oral Daily   pantoprazole  40 mg Oral BID AC   sodium bicarbonate  650 mg Oral TID   sucralfate  1 g Oral TID BM   vitamin C  250 mg Oral Daily    Continuous Infusions:  albumin human Stopped (06/21/19 1343)   cefTRIAXone (ROCEPHIN)  IV  2 g (06/21/19 1934)   octreotide  (SANDOSTATIN)    IV infusion 50 mcg/hr (06/21/19 1612)    PRN Meds: HYDROcodone-acetaminophen, LORazepam  Physical Exam         General: Alert, awake, in no acute distress.  HEENT: No bruits, no goiter, no JVD Heart: Regular rate and rhythm. No murmur appreciated. Lungs: Good air movement, clear Abdomen: Soft, nontender, distended, positive bowel sounds.  Skin: Warm and dry Ext: + edema Neuro: Grossly intact, nonfocal.  Vital Signs: BP 139/74 (BP Location: Right Arm)    Pulse 97    Temp 98 F (36.7 C) (Oral)    Resp 17    Ht 6' 3"  (1.905 m)    Wt 76.4 kg    SpO2 100%    BMI 21.05 kg/m  SpO2: SpO2: 100 % O2 Device: O2 Device: Room Air O2 Flow Rate:    Intake/output summary:   Intake/Output Summary (Last 24 hours) at 06/21/2019 2229 Last data filed at 06/21/2019 2145 Gross per 24 hour  Intake 1877.92 ml  Output 650 ml  Net 1227.92 ml   LBM: Last BM Date: 06/20/19 Baseline Weight: Weight: 75.3 kg Most recent weight: Weight: 76.4 kg       Palliative Assessment/Data:    Flowsheet Rows     Most Recent Value  Intake Tab  Referral Department  Hospitalist  Unit at Time of Referral  Med/Surg Unit  Palliative Care Primary Diagnosis  Nephrology  Date Notified  06/17/19  Palliative Care Type  New Palliative care  Reason for referral  Clarify Goals of Care  Date of Admission  06/17/19  Date first seen by Palliative Care  06/18/19  # of days Palliative referral response time  1 Day(s)  # of days IP prior to Palliative referral  0  Clinical Assessment  Palliative Performance  Scale Score  70%  Psychosocial & Spiritual Assessment  Palliative Care Outcomes  Patient/Family meeting held?  Yes  Who was at the meeting?  Patient      Patient Active Problem List   Diagnosis Date Noted   Acute on chronic renal failure ([place]) 06/17/2019   Hepatorenal syndrome (HCC)-Type 2 06/14/2019   Melena    Rectal bleeding    GI bleed 05/29/2019   AMS (altered mental status) 05/28/2019   Light headed 05/10/2019   Chronic renal disease, stage III 04/02/2019   Gastroesophageal reflux disease with esophagitis    Acute deep vein thrombosis (DVT) of femoral vein of right lower extremity ([place]) 03/30/2019   Right leg DVT ([place])    Narcotic dependence ([place]) 05/19/2017   Secondary esophageal varices with bleeding (HCC)    Leg pain, left 04/24/2016   Ascites 11/20/2015   Scrotal edema 11/20/2015   Hydrocele in adult 11/20/2015   Alcohol dependence ([place]) 09/26/2014   Thrombocytopenia ([place]) 09/26/2014   Routine general medical examination at a health care facility 88/82/8003   Alcoholic cirrhosis of liver ([place]) 09/24/2011   Portal hypertension ([place]) 09/11/2011   Angiodysplasia of colon 09/11/2011   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   Iron deficiency anemia due to chronic blood loss 09/05/2011   Personal history of colonic polyps 09/05/2011   DEGENERATIVE DISC DISEASE, 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  male  with past medical history of liver cirrhosis with prior scrotal edema, esophageal varices, GI bleed, alcohol abuse, CKD, DVT (not on anticoagulation due to prior GI bleed) admitted on 06/17/2019 with concern for hepatorenal syndrome.  Palliative consulted  for goals of care.   Assessment: Patient Active Problem List   Diagnosis Date Noted   Acute on chronic renal failure ([place]) 06/17/2019   Hepatorenal syndrome (HCC)-Type 2 06/14/2019   Melena    Rectal bleeding    GI bleed 05/29/2019   AMS (altered mental status) 05/28/2019   Light headed 05/10/2019   Chronic renal disease, stage III 04/02/2019   Gastroesophageal reflux disease with esophagitis    Acute deep vein thrombosis (DVT) of femoral vein of right lower extremity ([place]) 03/30/2019   Right leg DVT ([place])    Narcotic dependence ([place]) 05/19/2017   Secondary esophageal varices with bleeding (HCC)    Leg pain, left 04/24/2016   Ascites 11/20/2015   Scrotal edema 11/20/2015   Hydrocele in adult 11/20/2015   Alcohol dependence ([place]) 09/26/2014   Thrombocytopenia ([place]) 09/26/2014   Routine general medical examination at a health care facility 02/58/5277   Alcoholic cirrhosis of liver ([place]) 09/24/2011   Portal hypertension ([place]) 09/11/2011   Angiodysplasia of colon 09/11/2011   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   Iron deficiency anemia due to chronic blood loss 09/05/2011   Personal history of colonic polyps 09/05/2011   DEGENERATIVE DISC DISEASE, LUMBAR SPINE 10/12/2007   SLEEP DISORDER 10/12/2007   Depression 08/06/2006   Essential hypertension, benign 08/06/2006   Portal hypertensive gastropathy ([place]) 08/06/2006   DIVERTICULOSIS, COLON 08/06/2006   Chronic back pain greater than 3 months duration 08/06/2006    Recommendations/Plan: - DNR/DNI - His wife is his surrogate decision maker in the event he cannot make his own medical decisions. - We continued conversations regarding [place] and severity of his condition.  He reports understanding severity of his condition and after discussion with Dr. Silvio [name] he would like to work to discharge home tomorrow with whatever regimen nephrology feels is most appropriate and follow up with Dr. Silvio [name] for further  discussion and evaluation. - Recommend referral to outpatient palliative care on discharge.  Goals of Care and Additional Recommendations:  Limitations on Scope of Treatment: Full Scope Treatment  Code Status:    Code Status Orders  (From admission, onward)         Start     Ordered   06/17/19 2148  Full code  Continuous     06/17/19 2150        Code Status History    Date Active Date Inactive Code Status Order ID Comments User Context   06/04/2019 1809 06/09/2019 1728 Full Code [id number]  Flora Lipps, MD Inpatient   05/28/2019 1548 05/31/2019 2030 Full Code [id number]  Terrilee Croak, MD ED   03/30/2019 1549 03/31/2019 1816 Full Code [id number]  Nita Sells, MD Inpatient   04/13/2017 1540 04/16/2017 2227 Full Code [id number]  Charlesetta Garibaldi, MD Inpatient   01/06/2012 1548 01/10/2012 1511 Full Code [id number]  Grant Fontana, RN Inpatient   Advance Care Planning Activity    Advance Directive Documentation     Most Recent Value  Type of Advance Directive  Healthcare Power of Attorney, Living will  Pre-existing out of facility DNR order (yellow form or pink MOST form)  --  "MOST" Form in Place?  --       Prognosis:   Guarded  Discharge Planning:  Home with palliative care follow-up  Care plan was discussed with patient, RN. Dr. Karleen Hampshire  Thank you for allowing the Palliative Medicine Team to assist in the care of this patient.   Total Time 50 Prolonged Time Billed No      Greater than 50%  of this time was spent counseling and coordinating care related to the above assessment and plan.  Micheline Rough, MD  Please contact Palliative Medicine Team phone at [phone] for questions and concerns.

## 2019-06-21 NOTE — Telephone Encounter (Signed)
Gene Freeman, [place], called.  He was calling to discuss patient's condition.  Patient has appointment with Dr.Letvak this week and patient wanted him to call Dr.Letvak. He said it's no rush for a call back.

## 2019-06-21 NOTE — Telephone Encounter (Signed)
Please call him and let him know I had an extended conversation with the patient. He can just let me know if there is any other information that he has that he doesn't think I already have (from reviewing everything in Epic

## 2019-06-21 NOTE — Telephone Encounter (Signed)
I spoke to [employer]. I let him know Dr.Letvak's comments. He wanted Dr.Letvak know he's very thankful that Dr.Letvak reached out to patient.

## 2019-06-21 NOTE — Telephone Encounter (Signed)
I

## 2019-06-22 ENCOUNTER — Telehealth: Payer: Self-pay | Admitting: Internal Medicine

## 2019-06-22 LAB — BASIC METABOLIC PANEL
Anion gap: 10 (ref 5–15)
BUN: 49 mg/dL — ABNORMAL HIGH (ref 8–23)
CO2: 22 mmol/L (ref 22–32)
Calcium: 8.4 mg/dL — ABNORMAL LOW (ref 8.9–10.3)
Chloride: 103 mmol/L (ref 98–111)
Creatinine, Ser: 2.75 mg/dL — ABNORMAL HIGH (ref 0.61–1.24)
GFR calc Af Amer: 26 mL/min — ABNORMAL LOW (ref >60)
GFR calc non Af Amer: 22 mL/min — ABNORMAL LOW (ref >60)
Glucose, Bld: 106 mg/dL — ABNORMAL HIGH (ref 70–99)
Potassium: 3.4 mmol/L — ABNORMAL LOW (ref 3.5–5.1)
Sodium: 135 mmol/L (ref 135–145)

## 2019-06-22 LAB — CYTOLOGY - NON PAP

## 2019-06-22 LAB — CBC
HCT: 21.7 % — ABNORMAL LOW (ref 39.0–52.0)
Hemoglobin: 6.7 g/dL — CL (ref 13.0–17.0)
MCH: 29.8 pg (ref 26.0–34.0)
MCHC: 30.9 g/dL (ref 30.0–36.0)
MCV: 96.4 fL (ref 80.0–100.0)
Platelets: 79 10*3/uL — ABNORMAL LOW (ref 150–400)
RBC: 2.25 MIL/uL — ABNORMAL LOW (ref 4.22–5.81)
RDW: 18.7 % — ABNORMAL HIGH (ref 11.5–15.5)
WBC: 4.7 10*3/uL (ref 4.0–10.5)
nRBC: 0 % (ref 0.0–0.2)

## 2019-06-22 LAB — PHOSPHORUS: Phosphorus: 3.2 mg/dL (ref 2.5–4.6)

## 2019-06-22 LAB — HEMOGLOBIN AND HEMATOCRIT, BLOOD
HCT: 26.8 % — ABNORMAL LOW (ref 39.0–52.0)
Hemoglobin: 8.3 g/dL — ABNORMAL LOW (ref 13.0–17.0)

## 2019-06-22 LAB — PREPARE RBC (CROSSMATCH)

## 2019-06-22 MED ORDER — SODIUM CHLORIDE 0.9% IV SOLUTION
Freq: Once | INTRAVENOUS | Status: AC
  Administered 2019-06-22: 14:00:00 via INTRAVENOUS

## 2019-06-22 NOTE — Telephone Encounter (Signed)
palliative medicine called today in regards to the patient being discharged from [facility] today . They stated the wife Mardene Celeste called today and is having anxiety about the discharge plan. Since Dr Silvio Pate is involved with the plan they would like our office to give Mardene Celeste a call to follow up with her and the plan    Mardene Celeste (Wife) # [phone]

## 2019-06-22 NOTE — Progress Notes (Signed)
[facility])  Referral received for outpatient palliative care services once pt discharged.  Reviewed Epic notes, spouse has just had lengthy conversation with pts PCP.    Spoke with Mardene Celeste, offered support and answered questions.  She would like to hear what Dr. Silvio Pate will recommend on their appointment on Friday.  Discussed difference between palliative and hospice services.  Mardene Celeste has my contact information as well as our referral intake number.  [facility] will follow from a palliative perspective when he d/c's (possibly tomorrow) and anticipate a transition to hospice once PCP weighs in.  ACC will tentatively reserve an admission nurse visit for Saturday, should hospice be the appropriate choice.  Thank you, [name] RN, BSN, [facility]iaison (in [place]) [phone]

## 2019-06-22 NOTE — Progress Notes (Signed)
[facility] ROUNDING NOTE   Subjective:   He has been on midodrine, albumin, and octreotide for 2-3 days now, creat has improved some down to 2.75 today, BP's came up w/ above regimen.    Background:  This is a 73 year old gentleman with a history of liver cirrhosis complicated by scrotal edema GI bleed alcohol abuse history of DVT but no longer able to tolerate anticoagulation therapy.  Is a history of chronic kidney disease and it appears that his baseline creatinine has oscillated around 1.4 to 1.8 mg/dL urine output 200 cc 12/4.  Did receive a CT scan with contrast administered 06/07/2019.    paracentesis was performed 05/30/2019 with removal of 3.2 L paracentesis performed 06/19/2019 with removal of 3.1 L.        Objective:  Vital signs in last 24 hours:  Temp:  [97.5 F (36.4 C)-98.4 F (36.9 C)] 98 F (36.7 C) (12/08 1459) Pulse Rate:  [84-97] 85 (12/08 1459) Resp:  [17-20] 20 (12/08 1459) BP: (99-144)/(38-74) 144/74 (12/08 1459) SpO2:  [98 %-100 %] 100 % (12/08 1459)  Weight change:  Filed Weights   06/17/19 1836 06/18/19 0620  Weight: 75.3 kg 76.4 kg    Intake/Output: I/O last 3 completed shifts: In: 2320.6 [P.O.:870; I.V.:1015.6; IV Piggyback:435] Out: 650 [Urine:650]   Intake/Output this shift:  Total I/O In: 1356 [P.O.:1200; I.V.:148; IV Piggyback:8] Out: 950 [Urine:950]  General: Calm comfortable cachectic NAD HEENT: Normocephalic atraumatic oropharynx was clear nasal mucosa clear Eyes: Extraocular movements are intact Neck: JVP not elevated; supple Heart: Regular rate and rhythm no rub  Lungs: No wheezes rales; unlabored at rest Abdomen: Soft nontender to palpation  Extremities: 1-2+ pitting edema Skin: Nonicteric no bruises Neuro: Alert oriented x 3   Basic Metabolic Panel: Recent Labs  Lab 06/17/19 2234 06/18/19 0534 06/19/19 0511 06/20/19 0536 06/21/19 0502 06/22/19 0538  NA 129* 129* 134* 132* 135 135  K 4.0 4.1 4.7 4.1 3.5  3.4*  CL 101 100 102 100 102 103  CO2 17* 20* 18* 19* 20* 22  GLUCOSE 102* 127* 84 102* 111* 106*  BUN 48* 53* 51* 55* 52* 49*  CREATININE 2.67* 2.84* 2.97* 3.22* 2.93* 2.75*  CALCIUM 8.0* 8.1* 9.0 8.5* 8.4* 8.4*  MG 1.9  --   --   --   --   --   PHOS 4.5  --   --   --   --  3.2    Liver Function Tests: Recent Labs  Lab 06/17/19 2234  AST 36  ALT 27  ALKPHOS 112  BILITOT 0.8  PROT 5.5*  ALBUMIN 2.2*   No results for input(s): LIPASE, AMYLASE in the last 168 hours. No results for input(s): AMMONIA in the last 168 hours.  CBC: Recent Labs  Lab 06/18/19 0534 06/19/19 0511 06/22/19 0538  WBC 5.9 10.2 4.7  HGB 7.9* 7.7* 6.7*  HCT 26.5* 25.4* 21.7*  MCV 97.8 97.3 96.4  PLT 98* 95* 79*    Cardiac Enzymes: No results for input(s): CKTOTAL, CKMB, CKMBINDEX, TROPONINI in the last 168 hours.  Microbiology: Results for orders placed or performed during the hospital encounter of 06/17/19  SARS CORONAVIRUS 2 (TAT 6-24 HRS) Nasopharyngeal Nasopharyngeal Swab     Status: None   Collection Time: 06/18/19  6:06 AM   Specimen: Nasopharyngeal Swab  Result Value Ref Range Status   SARS Coronavirus 2 NEGATIVE NEGATIVE Final    Comment: (NOTE) SARS-CoV-2 target nucleic acids are NOT DETECTED. The SARS-CoV-2 RNA is generally  detectable in upper and lower respiratory specimens during the acute phase of infection. Negative results do not preclude SARS-CoV-2 infection, do not rule out co-infections with other pathogens, and should not be used as the sole basis for treatment or other patient management decisions. Negative results must be combined with clinical observations, patient history, and epidemiological information. The expected result is Negative. Fact Sheet for Patients: SugarRoll.be Fact Sheet for Healthcare Providers: https://www.woods-mathews.com/ This test is not yet approved or cleared by the [place] FDA and  has been  authorized for detection and/or diagnosis of SARS-CoV-2 by FDA under an Emergency Use Authorization (EUA). This EUA will remain  in effect (meaning this test can be used) for the duration of the COVID-19 declaration under Section 56 4(b)(1) of the Act, 21 U.S.C. section 360bbb-3(b)(1), unless the authorization is terminated or revoked sooner. Performed at [facility], [place] [address], [place], [place] [address]   Culture, body fluid-bottle     Status: None (Preliminary result)   Collection Time: 06/19/19  1:41 PM   Specimen: Fluid  Result Value Ref Range Status   Specimen Description FLUID  Final   Special Requests NONE  Final   Culture   Final    NO GROWTH 3 DAYS Performed at [facility] [address], [place], [place] [address]    Report Status PENDING  Incomplete  Gram stain     Status: None   Collection Time: 06/19/19  1:41 PM   Specimen: Fluid  Result Value Ref Range Status   Specimen Description FLUID  Final   Special Requests NONE  Final   Gram Stain   Final    CYTOSPIN SMEAR WBC PRESENT, PREDOMINANTLY MONONUCLEAR NO ORGANISMS SEEN Performed at [facility], [place] [address], [place], [place] [address]    Report Status 06/20/2019 FINAL  Final    Coagulation Studies: No results for input(s): LABPROT, INR in the last 72 hours.  Urinalysis: No results for input(s): COLORURINE, LABSPEC, PHURINE, GLUCOSEU, HGBUR, BILIRUBINUR, KETONESUR, PROTEINUR, UROBILINOGEN, NITRITE, LEUKOCYTESUR in the last 72 hours.  Invalid input(s): APPERANCEUR    Imaging: No results found.   Medications:   . albumin human 60 mL/hr at 06/22/19 1043  . cefTRIAXone (ROCEPHIN)  IV Stopped (06/21/19 2006)  . octreotide  (SANDOSTATIN)    IV infusion 50 mcg/hr (06/22/19 1043)   . buPROPion  150 mg Oral Daily  . ferrous sulfate  325 mg Oral QODAY  . lactulose  20 g Oral Daily  . midodrine  7.5 mg Oral TID WC  . mirtazapine  45 mg Oral QHS  . multivitamin with minerals   1 tablet Oral Daily  . pantoprazole  40 mg Oral BID AC  . sodium bicarbonate  650 mg Oral TID  . sucralfate  1 g Oral TID BM  . vitamin C  250 mg Oral Daily   HYDROcodone-acetaminophen, LORazepam  Assessment/ Plan:  1.  Acute kidney injury appears to be nonoliguric urine sodium less than 10.  Did receive IV contrast 06/07/2019.  Paracentesis 05/30/2019 with removal of 3.2 L.  Inactive urine sediment.  Negative for protein.  Possibly acute tubular necrosis.  No hemodynamic instability noted at this time.  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 Urinalysis bland renal ultrasound showed echogenic kidneys consistent with medical disease no hydronephrosis.    3.  Hyponatremia urine sodium less than 10.  Resolved.  Volume overload setting of cirrhosis.  4.  Metabolic acidosis - on sodium bicarbonate 650 mg 3 times daily  5. Anemia - update CBC in AM.  Setting of cirrhosis, AKI on CKD   6.  EOL - seen by pall care, pt now DNR   Sol Blazing 06/22/2019  4:09 PM

## 2019-06-22 NOTE — Progress Notes (Signed)
Palliative Medicine RN Note: Per request of Dr Rowe Pavy, called referral for OP Palliative care to [facility]; [facility] [facility]).  Marjie Skiff Renessa Wellnitz, RN, BSN, [facility] Palliative Medicine Team 06/22/2019 12:45 PM Office [phone]

## 2019-06-22 NOTE — Progress Notes (Signed)
Palliative Medicine RN Note: Rec'd call from pt's wife, Jenson Burnard. She has a lot of questions/anxiety about d/c plan. Dr Domingo Cocking, PMT MD who has been following Mr Buster is off service today. Dr Silvio Pate has been a strong support for Mr Kuehnle and has recommended d/c today with home palliative care, not hospice.  Left message with Dr Alla German office requesting they wife regarding plan and patient's health. I also reached out to [place] to let her know that Mrs Leese is having significant anxiety; Caryl Pina also expressed concern about discharging without hospice care in place, but patient's choice is to follow Dr Alla German recommendations. If/when Dr Karleen Hampshire is ready to discharge Mr Abass, Magan will reach out to Mrs Sanker to ensure any ordered equipment or services are arranged.  I called Mrs Afonso back to update her; no answer; left message.  Marjie Skiff Aleta Manternach, RN, BSN, [facility] Palliative Medicine Team 06/22/2019 11:57 AM Office [phone]

## 2019-06-22 NOTE — Progress Notes (Signed)
PROGRESS NOTE    Billy [name]  [id number] DOB: Oct 27, 1945 DOA: 06/17/2019 PCP: [name], MD   Brief Narrative:  73 year old gentleman prior history of liver cirrhosis, right lower extremity DVT not on anticoagulation due to GI bleed, long standing history of alcohol use disorder, GERD, chronic diastolic heart failure history of peptic ulcer disease, gastritis, hypertension, thrombocytopenia, esophageal varices was admitted for evaluation of acute kidney injury and possible hepatorenal syndrome.  Patient currently denies any chest pain shortness of breath, nausea vomiting .  Patient reports some abdominal pain, suspicious for SBP.  Ultrasound paracentesis done on 12/5 and fluid sent for analysis.  Patient  empirically started on Rocephin for possible SBP. Pt tearful of his prognosis, requesting for anti anxiety medications.  Pt seen and examined today. His hemoglobin dropped to 6.7 , 1 unit of prbc transfusion ordered.  Assessment & Plan:   Active Problems:   Acute on chronic renal failure (HCC)    Decompensated Liver cirrhosis with ascites and esophageal varices.  Fluid overloaded on admission,. Patient was  started him on IV lasix 80 mg BID,  With minimal improvement.  He also underwent  paracentesis on 12/5 and fluid sent for analysis. So far the cultures have been negative.  He was empirically started on IV rocephin for SBP, . If cultures remain negative , please D/C rocephin in am prior to discharge.  Please start lasix 40/80 mg BID as needed for fluid overload.  Meanwhile patient is on albumin, midodrine and octreotide.     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 vs ATN vs fluid overload Baseline creatinine around 1.5 , on admission at 2.5, worsened to 3.2 , started improving to 2.75 today.   Ruled out UTI. US renal does not show any hydronephrosis.  Nothing further to add.  Started the patient on octreotide and albumin, and midodrine.  As per renal ,  pt can be discharged on oral lasix 40/80 BID as needed for fluid overload , keeping in mind that it might worsen renal parameters.    Mild metabolic acidosis On oral bicarb supplementation. Bicarb is 22.   GERD/ PUD/ esophageal varices:  Pt denies any hematemesis or hematochezia: resume PPI and sucralfate.    Right LE DVT:  Not on anti coagulation sec to GI bleed.     Anemia of chronic disease:  Pt denies any tarry stools or rectal bleeding or hematuria. His H&H this am is 6.7/21.7, 1 unit of prbc transfusion ordered. Repeat H&H in am.  Transfuse to keep hemoglobin greater than 7.     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   No signs of bleeding.    Hyponatremia:  Possibly from fluid overload. Normal today.   In view of the multiple medical problems. Worsening renal parameters , poor functional status,  palliative care consulted for goals of care. He wanted to be DNR.  Pt would like to be discharged tomorrow and wanted to follow up with PCP on Friday.    DVT prophylaxis: scd's Code Status: full code.  Family Communication: none at bedside.  Disposition Plan: possible d/c home tomorrow    Consultants:   Nephrology.    Palliative care  Procedures: Ultrasound paracentesis  Antimicrobials: none.    Subjective: Pt appears to be in good spirits today, he reports the ativan is helping his anxiety and helping to cope with the prognosis. He reports family is coming to see him this weekend.  He wishes to talk to  his PCP on Friday and follow up with outpatient palliative care services if his PCP recommends it.  Objective: Vitals:   06/21/19 2040 06/22/19 0556 06/22/19 1344 06/22/19 1459  BP: 139/74 (!) 99/38 137/69 (!) 144/74  Pulse: 97 84 88 85  Resp: 17 17 18 20   Temp: 98 F (36.7 C) 98.4 F (36.9 C) (!) 97.5 F (36.4 C) 98 F (36.7 C)  TempSrc: Oral Oral Oral Oral  SpO2: 100% 98% 100% 100%  Weight:      Height:        Intake/Output  Summary (Last 24 hours) at 06/22/2019 1702 Last data filed at 06/22/2019 1457 Gross per 24 hour  Intake 1798.59 ml  Output 1150 ml  Net 648.59 ml   Filed Weights   06/17/19 1836 06/18/19 0620  Weight: 75.3 kg 76.4 kg    Examination:  General exam: alert and comfortable. Not in distress.  Respiratory system: clear to auscultation, no wheezing or rhonchi.  Cardiovascular system: S1S2, RRR, pedal edema improving.  Gastrointestinal system: abdomen is soft, distended, bowel sounds wnl. Tenderness improved.  Central nervous system: alert and oriented, non focal.  Extremities:,improving pedal edema. No cyanosis.  Skin: no rashes.  Psychiatry: mood appropriate.     Data Reviewed: I have personally reviewed following labs and imaging studies  CBC: Recent Labs  Lab 06/18/19 0534 06/19/19 0511 06/22/19 0538  WBC 5.9 10.2 4.7  HGB 7.9* 7.7* 6.7*  HCT 26.5* 25.4* 21.7*  MCV 97.8 97.3 96.4  PLT 98* 95* 79*   Basic Metabolic Panel: Recent Labs  Lab 06/17/19 2234 06/18/19 0534 06/19/19 0511 06/20/19 0536 06/21/19 0502 06/22/19 0538  NA 129* 129* 134* 132* 135 135  K 4.0 4.1 4.7 4.1 3.5 3.4*  CL 101 100 102 100 102 103  CO2 17* 20* 18* 19* 20* 22  GLUCOSE 102* 127* 84 102* 111* 106*  BUN 48* 53* 51* 55* 52* 49*  CREATININE 2.67* 2.84* 2.97* 3.22* 2.93* 2.75*  CALCIUM 8.0* 8.1* 9.0 8.5* 8.4* 8.4*  MG 1.9  --   --   --   --   --   PHOS 4.5  --   --   --   --  3.2   GFR: Estimated Creatinine Clearance: 26.6 mL/min (A) (by C-G formula based on SCr of 2.75 mg/dL (H)). Liver Function Tests: Recent Labs  Lab 06/17/19 2234  AST 36  ALT 27  ALKPHOS 112  BILITOT 0.8  PROT 5.5*  ALBUMIN 2.2*   No results for input(s): LIPASE, AMYLASE in the last 168 hours. No results for input(s): AMMONIA in the last 168 hours. Coagulation Profile: No results for input(s): INR, PROTIME in the last 168 hours. Cardiac Enzymes: No results for input(s): CKTOTAL, CKMB, CKMBINDEX, TROPONINI  in the last 168 hours. BNP (last 3 results) No results for input(s): PROBNP in the last 8760 hours. HbA1C: No results for input(s): HGBA1C in the last 72 hours. CBG: No results for input(s): GLUCAP in the last 168 hours. Lipid Profile: No results for input(s): CHOL, HDL, LDLCALC, TRIG, CHOLHDL, LDLDIRECT in the last 72 hours. Thyroid Function Tests: No results for input(s): TSH, T4TOTAL, FREET4, T3FREE, THYROIDAB in the last 72 hours. Anemia Panel: No results for input(s): VITAMINB12, FOLATE, FERRITIN, TIBC, IRON, RETICCTPCT in the last 72 hours. Sepsis Labs: No results for input(s): PROCALCITON, LATICACIDVEN in the last 168 hours.  Recent Results (from the past 240 hour(s))  SARS CORONAVIRUS 2 (TAT 6-24 HRS) Nasopharyngeal Nasopharyngeal Swab  Status: None   Collection Time: 06/18/19  6:06 AM   Specimen: Nasopharyngeal Swab  Result Value Ref Range Status   SARS Coronavirus 2 NEGATIVE NEGATIVE Final    Comment: (NOTE) SARS-CoV-2 target nucleic acids are NOT DETECTED. The SARS-CoV-2 RNA is generally detectable in upper and lower respiratory specimens during the acute phase of infection. Negative results do not preclude SARS-CoV-2 infection, do not rule out co-infections with other pathogens, and should not be used as the sole basis for treatment or other patient management decisions. Negative results must be combined with clinical observations, patient history, and epidemiological information. The expected result is Negative. Fact Sheet for Patients: SugarRoll.be Fact Sheet for Healthcare Providers: https://www.woods-mathews.com/ This test is not yet approved or cleared by the [place] FDA and  has been authorized for detection and/or diagnosis of SARS-CoV-2 by FDA under an Emergency Use Authorization (EUA). This EUA will remain  in effect (meaning this test can be used) for the duration of the COVID-19 declaration under Section  56 4(b)(1) of the Act, 21 U.S.C. section 360bbb-3(b)(1), unless the authorization is terminated or revoked sooner. Performed at [facility], [place] [address] [place], [place] [address]   Culture, body fluid-bottle     Status: None (Preliminary result)   Collection Time: 06/19/19  1:41 PM   Specimen: Fluid  Result Value Ref Range Status   Specimen Description FLUID  Final   Special Requests NONE  Final   Culture   Final    NO GROWTH 3 DAYS Performed at [facility] [address], [place], [place] [address]    Report Status PENDING  Incomplete  Gram stain     Status: None   Collection Time: 06/19/19  1:41 PM   Specimen: Fluid  Result Value Ref Range Status   Specimen Description FLUID  Final   Special Requests NONE  Final   Gram Stain   Final    CYTOSPIN SMEAR WBC PRESENT, PREDOMINANTLY MONONUCLEAR NO ORGANISMS SEEN Performed at [facility], [place] [address] [place], [place] [address]    Report Status 06/20/2019 FINAL  Final         Radiology Studies: No results found.      Scheduled Meds: . buPROPion  150 mg Oral Daily  . ferrous sulfate  325 mg Oral QODAY  . lactulose  20 g Oral Daily  . midodrine  7.5 mg Oral TID WC  . mirtazapine  45 mg Oral QHS  . multivitamin with minerals  1 tablet Oral Daily  . pantoprazole  40 mg Oral BID AC  . sodium bicarbonate  650 mg Oral TID  . sucralfate  1 g Oral TID BM  . vitamin C  250 mg Oral Daily   Continuous Infusions: . albumin human 60 mL/hr at 06/22/19 1043  . cefTRIAXone (ROCEPHIN)  IV Stopped (06/21/19 2006)  . octreotide  (SANDOSTATIN)    IV infusion 50 mcg/hr (06/22/19 1659)     LOS: 5 days        Hosie Poisson, MD [facility] 06/22/2019, 5:02 PM

## 2019-06-22 NOTE — Telephone Encounter (Signed)
Extended conversation with her She is not getting much information and is very anxious She has seen significant decline in the past year I told he I am not convinced he has hepatorenal syndrome (with imminent death ahead), but his liver is very bad and it may be worth considering hospice now (otherwise just palliative)  He is staying tonight and getting blood.  Billy [name],  Please cancel his appt tomorrow and open up the slots I told her to bring him on Friday at 10:15. Please put him in and block my 11AM same day as well

## 2019-06-23 ENCOUNTER — Telehealth: Payer: Self-pay

## 2019-06-23 ENCOUNTER — Inpatient Hospital Stay: Payer: Self-pay | Admitting: Internal Medicine

## 2019-06-23 LAB — TYPE AND SCREEN
ABO/RH(D): O POS
Antibody Screen: NEGATIVE
Unit division: 0

## 2019-06-23 LAB — BASIC METABOLIC PANEL
Anion gap: 12 (ref 5–15)
BUN: 45 mg/dL — ABNORMAL HIGH (ref 8–23)
CO2: 21 mmol/L — ABNORMAL LOW (ref 22–32)
Calcium: 8.9 mg/dL (ref 8.9–10.3)
Chloride: 104 mmol/L (ref 98–111)
Creatinine, Ser: 2.3 mg/dL — ABNORMAL HIGH (ref 0.61–1.24)
GFR calc Af Amer: 32 mL/min — ABNORMAL LOW (ref >60)
GFR calc non Af Amer: 28 mL/min — ABNORMAL LOW (ref >60)
Glucose, Bld: 116 mg/dL — ABNORMAL HIGH (ref 70–99)
Potassium: 3.2 mmol/L — ABNORMAL LOW (ref 3.5–5.1)
Sodium: 137 mmol/L (ref 135–145)

## 2019-06-23 LAB — BPAM RBC
Blood Product Expiration Date: 202101102359
ISSUE DATE / TIME: 202012081425
Unit Type and Rh: 5100

## 2019-06-23 LAB — CBC
HCT: 27.4 % — ABNORMAL LOW (ref 39.0–52.0)
Hemoglobin: 8.4 g/dL — ABNORMAL LOW (ref 13.0–17.0)
MCH: 29.3 pg (ref 26.0–34.0)
MCHC: 30.7 g/dL (ref 30.0–36.0)
MCV: 95.5 fL (ref 80.0–100.0)
Platelets: 78 10*3/uL — ABNORMAL LOW (ref 150–400)
RBC: 2.87 MIL/uL — ABNORMAL LOW (ref 4.22–5.81)
RDW: 18.4 % — ABNORMAL HIGH (ref 11.5–15.5)
WBC: 5 10*3/uL (ref 4.0–10.5)
nRBC: 0 % (ref 0.0–0.2)

## 2019-06-23 MED ORDER — FUROSEMIDE 40 MG PO TABS: 40.0000 mg | ORAL_TABLET | Freq: Two times a day (BID) | ORAL | 0 refills | Status: AC

## 2019-06-23 MED ORDER — SODIUM BICARBONATE 650 MG PO TABS: 650.0000 mg | ORAL_TABLET | Freq: Three times a day (TID) | ORAL | 0 refills | Status: AC

## 2019-06-23 MED ORDER — MIDODRINE HCL 2.5 MG PO TABS: 7.5000 mg | ORAL_TABLET | Freq: Three times a day (TID) | ORAL | 0 refills | Status: DC

## 2019-06-23 MED ORDER — POTASSIUM CHLORIDE CRYS ER 20 MEQ PO TBCR
20.0000 meq | EXTENDED_RELEASE_TABLET | Freq: Once | ORAL | Status: AC
  Administered 2019-06-23: 20 meq via ORAL
  Filled 2019-06-23: qty 1

## 2019-06-23 NOTE — Telephone Encounter (Signed)
Transition Care Management Follow-up Telephone Call  Date of discharge and from where: 06/23/2019, [name]  How have you been since you were released from the hospital? Patient states that he feels great. No concerns.   Any questions or concerns? No   Items Reviewed:  Did the pt receive and understand the discharge instructions provided? Yes   Medications obtained and verified? Yes   Any new allergies since your discharge? NO  Dietary orders reviewed? Yes  Do you have support at home? Yes   Functional Questionnaire: (I = Independent and D = Dependent) ADLs: I  Bathing/Dressing- I  Meal Prep- I  Eating- I  Maintaining continence- I  Transferring/Ambulation- I  Managing Meds- I  Follow up appointments reviewed:   PCP Hospital f/u appt confirmed? Yes  Scheduled to see Dr. Silvio Pate on 06/25/2019 @ 10:15 am.  [facility] f/u appt confirmed? N/A  Are transportation arrangements needed? No   If their condition worsens, is the pt aware to call PCP or go to the Emergency Dept.? Yes  Was the patient provided with contact information for the PCP's office or ED? Yes  Was to pt encouraged to call back with questions or concerns? Yes

## 2019-06-23 NOTE — Progress Notes (Signed)
Reviewed discharge paperwork, medication regimen, and follow up appointments with patient and wife. Went over recommendation of renal diet, and provided prescriptions. Patient walked out accompanied by RN.

## 2019-06-23 NOTE — TOC Transition Note (Addendum)
Transition of Care [facility]) - CM/SW Discharge Note   Patient Details  Name: Billy [name] MRN: [id number] Date of Birth: 19-Apr-1946  [facility] CM/SW Contact:  Wende Neighbors, LCSW Phone Number: 06/23/2019, 12:13 PM   Clinical Narrative:   CSW spoke to patient wife via phone. Wife stated that she will pick patient up from the hospital. Wife made aware that [facility] will follow patient for palliative needs in the home. Wife stated that patient will see his PCP Friday to follow up on hospice needs. CSW signing off as patient needs have been met.       Barriers to Discharge: Continued Medical Work up   Patient Goals and CMS Choice        Discharge Placement                       Discharge Plan and Services                                     Social Determinants of Health (SDOH) Interventions     Readmission Risk Interventions Readmission Risk Prevention Plan 06/18/2019 06/07/2019  Transportation Screening Complete Complete  PCP or Specialist Appt within 3-5 Days Complete Complete  Not Complete comments Not ready for dc pt established with providers  [place] or [place] Complete Complete  Social Work Consult for [place] Planning/Counseling Not Complete Complete  SW consult not completed comments NA -  Palliative Care Screening Not Applicable Not Complete  Palliative Care Screening Not Complete Comments NA pending need  Medication Review (RN Care Manager) Complete Referral to Pharmacy  Some recent data might be hidden

## 2019-06-23 NOTE — Discharge Summary (Signed)
Physician Discharge Summary  Billy [name] [id number] DOB: Apr 03, 1946 DOA: 06/17/2019  PCP: [name], MD  Admit date: 06/17/2019 Discharge date: 06/23/2019  Admitted From: Home Disposition: Home  Recommendations for Outpatient Follow-up:  1. Follow up with PCP on Friday 2. Please obtain BMP/CBC at the time of follow up for close monitoring of his renal function and hemoglobin.   Home Health: No Equipment/Devices:No   Discharge Condition:Stable CODE STATUS:DNR Diet recommendation: Heart Healthy, renal diet    Brief/Interim Summary: 73 year old gentleman prior history of liver cirrhosis, right lower extremity DVT not on anticoagulation due to GI bleed, long standing history of alcohol use disorder, GERD, chronic diastolic heart failure history of peptic ulcer disease, gastritis, hypertension, thrombocytopenia, esophageal varices was admitted for evaluation of acute kidney injury and possible hepatorenal syndrome.  Patient currently denies any chest pain shortness of breath, nausea vomiting .  Patient reports some abdominal pain.  Ultrasound paracentesis done on 12/5 and fluid sent for analysis with NGTD x 3 days on fluid cultures. He had been treated with Rocephin empirically for SBP but this was discontinued as SBP was ruled out.  His hemoglobin dropped to 6.7 , 1 unit of prbc transfusion with appropriate rise in Hgb which is stable at the time of discharge. No active bleeding noted. Palliative will continue following him in the outpatient setting. The patient will discuss further [place] with his PCP with appointment on Friday and will decide if he wants to have hospice care. Patient seen by Nephrology while inpatient, started on midodrine, Lasix, and sodium bicarb for hepatorenal syndrome. He will continue these new medications after discharge but needs close monitoring of his renal function while on Lasix. He will continue a renal diet after discharge. This was discussed with the  patient and he agrees with the this plan.   Discharge Diagnoses:  Principal Problem:   Hepatorenal syndrome (HCC)-Type 2 Active Problems:   Depression   Essential hypertension, benign   Portal hypertensive gastropathy (HCC)   DEGENERATIVE DISC DISEASE, LUMBAR SPINE   Iron deficiency anemia due to chronic blood loss   Alcoholic cirrhosis of liver (HCC)   Ascites   Narcotic dependence ([place])   Acute on chronic renal failure (HCC)   Decompensated Liver cirrhosis with ascites and esophageal varices.  Fluid overloaded on admission,. Patient was  started him on IV lasix 80 mg BID with some improvement and was transitioned to po Lasix at the time of discharge.  He also underwent  paracentesis on 12/5 and fluid sent for analysis with fluid cultured negative x3 days.  He was empirically started on IV rocephin for SBP but this was discontinued and he will not continue and antibiotic treatment after discharge.  He was treated with albumin, midodrine and octreotide while inpatient and will continue midodrine after discharge.     Acute on stage 3 CKD and hepato renal syndrome type II.  Baseline creatinine around 1.5 , on admission at 2.5, worsened to 3.2 , started improving down to 2.3 now.  Ruled out UTI. US renal does not show any hydronephrosis.  Please see below for note from Dr. Jonnie Finner in Nephrology.    Mild metabolic acidosis On oral bicarb supplementation.   GERD/ PUD/ esophageal varices:  Pt denies any hematemesis or hematochezia: resume PPI.  Right LE DVT:  Not on anti coagulation sec to GI bleed.    Anemia of chronic disease:  Pt denies any tarry stools or rectal bleeding or hematuria. His H&H dropped during this hospitalization,  as low as  6.7/21.7. He had 1 unit of prbc transfusion ordered. Repeat H&H stable at 8.4 on the day of his discharge.  He will continue iron supplementation.    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   No signs  of bleeding.    Hyponatremia:  Possibly from fluid overload. Sodium normal today.   In view of the multiple medical problems. Worsening renal parameters , poor functional status,  palliative care consulted for goals of care. He wanted to be DNR.  He would like to follow up with PCP on Friday and decide if he wants hospice care, he will continue outpatient Palliative care.    Consultant note, Nephrologist:  "1.Acute kidney injury appears to be nonoliguric urine sodium less than 10. Did receive IV contrast 06/07/2019. Paracentesis 05/30/2019 with removal of 3.2 L. Inactive urine sediment. Negative for protein. Possibly acute tubular necrosis. No hemodynamic instability noted at this time. Would be consistent with hepatorenal syndrome - has had renal improvement creat down to 2.75, per pmd pt wants to go home - ok for dc - renal function is slowly worsening , was 1.5- 2.1 in Nov and now in 2-3 range - diuretics can be tried at home for volume control , would recommend lasix 40- 80 mg po bid w/ caveat that this could worsen his renal function - would continue midodrine at dc - have d/w pmd, will sign off  2.  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 Urinalysis bland renal ultrasound showed echogenic kidneys consistent with medical disease no hydronephrosis.   3.Hyponatremia urine sodium less than 10.  Resolved.  Volume overload setting of cirrhosis.  4.Metabolic acidosis - on sodium bicarbonate 650 mg 3 times daily  5. Anemia - update CBC in AM.  Setting of cirrhosis, AKI on CKD   6.  EOL - seen by pall care, pt now DNR   Sol Blazing 06/22/2019  4:09 PM."  Discharge Instructions  Discharge Instructions    Amb Referral to Palliative Care   Complete by: As directed    Diet general   Complete by: As directed    Renal diet with low  potassium   Discharge instructions   Complete by: As directed    Follow up with your primary care doctor on Friday Continue following a renal diet with low potassium foods.   Increase activity slowly   Complete by: As directed      Allergies as of 06/23/2019      Reactions   Yellow Jacket Venom Anaphylaxis, Shortness Of Breath, Other (See Comments)   Throat closes up/eye swell shut   Nsaids Other (See Comments)   Stomach pain, Bleeding risk      Medication List    TAKE these medications   buPROPion 150 MG 12 hr tablet Commonly known as: WELLBUTRIN SR TAKE 1 TABLET BY MOUTH EVERY DAY   ferrous sulfate 325 (65 FE) MG EC tablet Take 325 mg by mouth every other day.   fluocinonide cream 0.05 % Commonly known as: LIDEX Apply 1 application topically 2 (two) times daily as needed (for skin rash/irritation). APPLY ON THE SKIN TWICE A DAY AS NEEDED FOR RASH   furosemide 40 MG tablet Commonly known as: Lasix Take 1 tablet (40 mg total) by mouth 2 (two) times daily.   HYDROcodone-acetaminophen 5-325 MG tablet Commonly  known as: NORCO/VICODIN Take 1 tablet by mouth 3 (three) times daily as needed (for pain.).   lactulose 10 GM/15ML solution Commonly known as: CHRONULAC Take 30 mLs (20 g total) by mouth daily. Please keep 3-4 bowel movement daily, please call your GI doctor if your bm less than three times a day or more than 4 times a day. Your GI doctor will help you adjust lactulose dose.   midodrine 2.5 MG tablet Commonly known as: PROAMATINE Take 3 tablets (7.5 mg total) by mouth 3 (three) times daily with meals.   mirtazapine 45 MG tablet Commonly known as: REMERON TAKE 1 TABLET (45 MG TOTAL) BY MOUTH AT BEDTIME.   multivitamin with minerals Tabs tablet Take 1 tablet by mouth daily.   pantoprazole 40 MG tablet Commonly known as: PROTONIX Take 1 tablet (40 mg total) by mouth 2 (two) times daily before a meal.   silver sulfADIAZINE 1 % cream Commonly known as:  SILVADENE Apply topically 2 (two) times daily. To left thigh   sodium bicarbonate 650 MG tablet Take 1 tablet (650 mg total) by mouth 3 (three) times daily.   vitamin C 250 MG tablet Commonly known as: ASCORBIC ACID Take 250 mg by mouth daily.       Allergies  Allergen Reactions  . Yellow Jacket Venom Anaphylaxis, Shortness Of Breath and Other (See Comments)    Throat closes up/eye swell shut  . Nsaids Other (See Comments)    Stomach pain, Bleeding risk    Consultations: Nephrology Palliative Care  Procedures/Studies: Ct Head Wo Contrast  Result Date: 05/28/2019 CLINICAL DATA:  Altered mental status 1 month. Incontinent since yesterday. EXAM: CT HEAD WITHOUT CONTRAST TECHNIQUE: Contiguous axial images were obtained from the base of the skull through the vertex without intravenous contrast. COMPARISON:  None. FINDINGS: Brain: Ventricles, cisterns and other CSF spaces are normal. There is minimal chronic ischemic microvascular disease. There is no mass, mass effect, shift of midline structures or acute hemorrhage. No evidence of acute infarction. Vascular: No hyperdense vessel or unexpected calcification. Skull: Normal. Negative for fracture or focal lesion. Sinuses/Orbits: No acute finding. Other: None. IMPRESSION: 1.  No acute findings. 2.  Minimal chronic ischemic microvascular disease. Electronically Signed   By: Marin Olp M.D.   On: 05/28/2019 14:51   US Abdomen Complete  Result Date: 05/29/2019 CLINICAL DATA:  Ascites due to alcoholic cirrhosis. EXAM: ABDOMEN ULTRASOUND COMPLETE COMPARISON:  01/14/2019 FINDINGS: Gallbladder: Status post cholecystectomy. Common bile duct: Diameter: 3.4 mm Liver: Cirrhotic liver appears shrunken with a nodular contour and heterogeneous echotexture. No focal liver abnormality. Portal vein is patent on color Doppler imaging with normal direction of blood flow towards the liver. IVC: No abnormality visualized. Pancreas: Visualized portion  unremarkable. Spleen: Size and appearance within normal limits. Right Kidney: Length: 10.3 cm. Echogenicity within normal limits. No mass or hydronephrosis visualized. Left Kidney: Length: 9.6 cm. Echogenicity within normal limits. No mass or hydronephrosis visualized. Abdominal aorta: No aneurysm visualized. Other findings: Moderate volume of ascites identified within all 4 quadrants of the abdomen. IMPRESSION: 1. Morphologic features of liver compatible with cirrhosis. 2. Moderate volume of ascites is identified in all 4 quadrants of the abdomen. Electronically Signed   By: Kerby Moors M.D.   On: 05/29/2019 11:17   Ir Ivc Filter Plmt / S&i /img Guid/mod Sed  Result Date: 06/07/2019 INDICATION: 73 year old with recent diagnosis of right lower extremity DVT in September 2020. He was subsequently placed on Eliquis but presents with bleeding of esophageal/gastric  varices. He has cirrhosis and portal hypertension. This represents an absolute contraindication to further anticoagulation and he is therefore a candidate for PE prophylaxis by caval interruption. He presents for placement of an IVC filter. EXAM: ULTRASOUND GUIDANCE FOR VASCULARACCESS IVC CATHETERIZATION AND VENOGRAM IVC FILTER INSERTION Interventional Radiologist:  Criselda Peaches, MD MEDICATIONS: None. ANESTHESIA/SEDATION: Fentanyl 100 mcg IV; Versed 2 mg IV Moderate Sedation Time:  12 minutes The patient was continuously monitored during the procedure by the interventional radiology nurse under my direct supervision. FLUOROSCOPY TIME:  Fluoroscopy Time: 0 minutes 30 seconds (165 mGy). COMPLICATIONS: None immediate. PROCEDURE: Informed written consent was obtained from the patient after a thorough discussion of the procedural risks, benefits and alternatives. All questions were addressed. Maximal Sterile Barrier Technique was utilized including caps, mask, sterile gowns, sterile gloves, sterile drape, hand hygiene and skin antiseptic. A timeout  was performed prior to the initiation of the procedure. Maximal barrier sterile technique utilized including caps, mask, sterile gowns, sterile gloves, large sterile drape, hand hygiene, and Betadine prep. Under sterile condition and local anesthesia, right internal jugular venous access was performed with ultrasound. An ultrasound image was saved and sent to PACS. Over a guidewire, the IVC filter delivery sheath and inner dilator were advanced into the IVC just above the IVC bifurcation. Contrast injection was performed for an IVC venogram. Through the delivery sheath, a retrievable Denali IVC filter was deployed below the level of the renal veins and above the IVC bifurcation. Limited post deployment venacavagram was performed. The delivery sheath was removed and hemostasis was obtained with manual compression. A dressing was placed. The patient tolerated the procedure well without immediate post procedural complication. FINDINGS: The IVC is patent. No evidence of thrombus, stenosis, or occlusion. No variant venous anatomy. Successful placement of the IVC filter below the level of the renal veins. IMPRESSION: Successful ultrasound and fluoroscopically guided placement of an infrarenal retrievable IVC filter via right jugular approach. PLAN: This IVC filter is potentially retrievable. The patient will be assessed for filter retrieval by Interventional Radiology in approximately 8-12 weeks. Further recommendations regarding filter retrieval, continued surveillance or declaration of device permanence, will be made at that time. Electronically Signed   By: Jacqulynn Cadet M.D.   On: 06/07/2019 13:10   US Renal  Result Date: 06/17/2019 CLINICAL DATA:  Acute kidney injury EXAM: RENAL / URINARY TRACT ULTRASOUND COMPLETE COMPARISON:  06/08/2019 FINDINGS: Right Kidney: Renal measurements: 9.9 x 4.5 x 5.2 cm = volume: 119.1 mL. Cortex is echogenic. Negative for hydronephrosis or mass Left Kidney: Renal measurements: 9  x 5 x 4.7 cm = volume: 107.8 mL. Cortex is echogenic. Negative for hydronephrosis or mass. Bladder: Appears normal for degree of bladder distention. Other: Cirrhotic morphology of the liver. Moderate ascites within the abdomen and pelvis. IMPRESSION: 1. Echogenic kidneys consistent with medical renal disease. No hydronephrosis 2. Cirrhosis of the liver with moderate ascites Electronically Signed   By: Donavan Foil M.D.   On: 06/17/2019 22:42   US Renal  Result Date: 06/08/2019 CLINICAL DATA:  Renal failure and hypertension EXAM: RENAL / URINARY TRACT ULTRASOUND COMPLETE COMPARISON:  CTA from the previous day. FINDINGS: Right Kidney: Renal measurements: 10.9 x 4.0 x 5.2 cm. = volume: 117 mL. Mild increased echogenicity is noted. Left Kidney: Renal measurements: 10.8 x 5.3 x 4.8 cm. = volume: 144 mL. Mild increased echogenicity is noted. Bladder: Appears normal for degree of bladder distention. Other: Considerable ascites is noted consistent with cirrhosis IMPRESSION: Increased echogenicity within the  kidneys consistent with medical renal disease. Ascites is noted similar to that seen on prior CT consistent with underlying cirrhosis. Electronically Signed   By: Inez Catalina M.D.   On: 06/08/2019 19:40   US Paracentesis  Result Date: 06/19/2019 INDICATION: Patient with history of alcoholic cirrhosis, chronic kidney disease, recurrent ascites, chronic diastolic heart failure; request received for diagnostic and therapeutic paracentesis. EXAM: ULTRASOUND GUIDED DIAGNOSTIC AND THERAPEUTIC PARACENTESIS MEDICATIONS: None COMPLICATIONS: None immediate. PROCEDURE: Informed written consent was obtained from the patient after a discussion of the risks, benefits and alternatives to treatment. A timeout was performed prior to the initiation of the procedure. Initial ultrasound scanning demonstrates a large amount of ascites within the right lower abdominal quadrant. The right lower abdomen was prepped and draped in the  usual sterile fashion. 1% lidocaine was used for local anesthesia. Following this, a 19 gauge, 7-cm, Yueh catheter was introduced. An ultrasound image was saved for documentation purposes. The paracentesis was performed. The catheter was removed and a dressing was applied. The patient tolerated the procedure well without immediate post procedural complication. Patient received post-procedure intravenous albumin; see nursing notes for details. FINDINGS: A total of approximately 3.7 liters of light yellow fluid was removed. Samples were sent to the laboratory as requested by the clinical team. IMPRESSION: Successful ultrasound-guided diagnostic and therapeutic paracentesis yielding 3.7 liters of peritoneal fluid. Read by: Rowe Robert, PA-C Electronically Signed   By: Lucrezia Europe M.D.   On: 06/19/2019 14:15   US Paracentesis  Result Date: 05/30/2019 INDICATION: Patient with history alcoholic cirrhosis, esophageal varices s/p banding, abdominal distension, and ascites. Request is made for diagnostic and therapeutic paracentesis. EXAM: ULTRASOUND GUIDED DIAGNOSTIC AND THERAPEUTIC PARACENTESIS MEDICATIONS: 15 mL 1% lidocaine COMPLICATIONS: None immediate. PROCEDURE: Informed written consent was obtained from the patient after a discussion of the risks, benefits and alternatives to treatment. A timeout was performed prior to the initiation of the procedure. Initial ultrasound scanning demonstrates a moderate amount of ascites within the left lower abdominal quadrant. The left lower abdomen was prepped and draped in the usual sterile fashion. 1% lidocaine was used for local anesthesia. Following this, a 19 gauge, 7-cm, Yueh catheter was introduced. An ultrasound image was saved for documentation purposes. The paracentesis was performed. The catheter was removed and a dressing was applied. The patient tolerated the procedure well without immediate post procedural complication. Patient received post-procedure intravenous  albumin; see nursing notes for details. FINDINGS: A total of approximately 3.2 L of clear yellow fluid was removed. Samples were sent to the laboratory as requested by the clinical team. IMPRESSION: Successful ultrasound-guided paracentesis yielding 3.2 L of peritoneal fluid. Read by: Earley Abide, PA-C Electronically Signed   By: Sandi Mariscal M.D.   On: 05/30/2019 12:13   Dg Chest Port 1 View  Result Date: 05/28/2019 CLINICAL DATA:  Altered mental status, weakness. EXAM: PORTABLE CHEST 1 VIEW COMPARISON:  07/14/2018. FINDINGS: Trachea is midline. Heart size normal. Lungs are clear. Left costophrenic angle was not included on the image. No large pleural effusion. IMPRESSION: No acute findings. Electronically Signed   By: Lorin Picket M.D.   On: 05/28/2019 13:57   Vas [place] Lower Extremity Venous (dvt)  Result Date: 06/01/2019  Lower Venous Study Indications: History of DVT.  Anticoagulation: Eliquis. Comparison Study: 03/30/2019 - Right EIV, CFV, FV, PopV, PTV, PeroV Performing Technologist: Oliver Hum RVT  Examination Guidelines: A complete evaluation includes B-mode imaging, spectral Doppler, color Doppler, and power Doppler as needed of all accessible portions of  each vessel. Bilateral testing is considered an integral part of a complete examination. Limited examinations for reoccurring indications may be performed as noted.  +---------+---------------+---------+-----------+----------+-----------------+ RIGHT    CompressibilityPhasicitySpontaneityPropertiesThrombus Aging    +---------+---------------+---------+-----------+----------+-----------------+ CFV      Partial        Yes      Yes                  Age Indeterminate +---------+---------------+---------+-----------+----------+-----------------+ SFJ      Full                                                           +---------+---------------+---------+-----------+----------+-----------------+ FV Prox  Partial        No        No                   Age Indeterminate +---------+---------------+---------+-----------+----------+-----------------+ FV Mid   Partial        No       No                   Age Indeterminate +---------+---------------+---------+-----------+----------+-----------------+ FV DistalPartial        Yes      Yes                  Age Indeterminate +---------+---------------+---------+-----------+----------+-----------------+ PFV      Full                                                           +---------+---------------+---------+-----------+----------+-----------------+ POP      Partial        No       No                   Age Indeterminate +---------+---------------+---------+-----------+----------+-----------------+ PTV      Full                                                           +---------+---------------+---------+-----------+----------+-----------------+ PERO     Full                                                           +---------+---------------+---------+-----------+----------+-----------------+   +----+---------------+---------+-----------+----------+--------------+ LEFTCompressibilityPhasicitySpontaneityPropertiesThrombus Aging +----+---------------+---------+-----------+----------+--------------+ CFV Full           Yes      Yes                                 +----+---------------+---------+-----------+----------+--------------+     Summary: Right: Findings consistent with age indeterminate deep vein thrombosis involving the right common femoral vein, right femoral vein, and right popliteal vein. No cystic structure found in the popliteal fossa. Left: No evidence of common femoral vein  obstruction.  *See table(s) above for measurements and observations. Electronically signed by Deitra Mayo MD on 06/01/2019 at 1:09:33 PM.    Final    Ir Radiologist Eval & Mgmt  Result Date: 05/26/2019 Please refer to notes tab for details  about interventional procedure. (Op Note)  Ct Angio Abd/pel W/ And/or W/o  Result Date: 06/07/2019 CLINICAL DATA:  73 year old male with alcoholic cirrhosis complicated by ascites and bleeding esophageal varices. Bleeding occurred in the setting of anticoagulation for recent right lower extremity DVT. An IVC filter was just placed. Evaluate anatomy for possible TIPS versus BRTO. EXAM: CTA ABDOMEN AND PELVIS WITHOUT AND WITH CONTRAST TECHNIQUE: Multidetector CT imaging of the abdomen and pelvis was performed using the standard protocol during bolus administration of intravenous contrast. Multiplanar reconstructed images and MIPs were obtained and reviewed to evaluate the vascular anatomy. CONTRAST:  57mL OMNIPAQUE IOHEXOL 350 MG/ML SOLN COMPARISON:  None. FINDINGS: VASCULAR Aorta: Heterogeneous atherosclerotic plaque throughout the abdominal aorta. No evidence of aneurysm or dissection. Celiac: Patent without evidence of aneurysm, dissection, vasculitis or significant stenosis. SMA: Patent without evidence of aneurysm, dissection, vasculitis or significant stenosis. Replaced right hepatic artery. Renals: Solitary renal arteries bilaterally. Calcified atherosclerotic plaque at the origins without significant stenosis. IMA: Patent without evidence of aneurysm, dissection, vasculitis or significant stenosis. Inflow: Calcified atherosclerotic plaque without significant stenosis or occlusion. Proximal Outflow: Calcified atherosclerotic plaque without significant stenosis or occlusion. Veins: The right and left portal veins are patent. Nonocclusive thrombus is present in the main portal vein. I patent channel persists. The splenic vein is widely patent. 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 The presence of calcification associated with the thrombus suggests that this is a chronic process. No significant gastro renal shunt or gastric varices. Review of the MIP images confirms the above  findings. NON-VASCULAR Lower chest: Small right pleural effusion with associated right lower lobe atelectasis. The visualized heart is normal in size. Calcifications present along the coronary arteries. No pericardial effusion. Hepatobiliary: Morphologically cirrhotic liver. The liver is small and extremely nodular. Small region of non masslike arterial hyperenhancement in hepatic segment 3 measures approximately 1.4 cm. No convincing evidence of washout on the delayed images. The gallbladder is surgically absent. Pancreas: Diffuse atrophy of the pancreas. Spleen: Mild splenomegaly. Adrenals/Urinary Tract: Normal adrenal glands. No evidence of hydronephrosis, nephrolithiasis or enhancing renal mass. Unremarkable ureters and bladder. Stomach/Bowel: Mild submucosal edema throughout the small bowel consistent with portal enteropathy. Colonic diverticular disease without CT evidence of active inflammation. Normal appendix. Lymphatic: No suspicious adenopathy. Reproductive: Prostate is unremarkable. Other: Large volume ascites. Mesenteric edema. Right inguinal hernia with ascites extending through the inguinal canal and into the scrotum. Musculoskeletal: The bones are osteopenic, particularly in the pelvis and proximal femurs. No discrete lytic or blastic osseous lesion. Multilevel degenerative disc disease. Bilateral lower lumbar facet arthropathy. Degenerative changes at the disc spacer most significant at L5-S1. There is mild degenerative anterolisthesis of L4 on L5. IMPRESSION: VASCULAR 1. 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 Anatomically, patient could be considered for TIPS creation. 2. 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 3. No evidence of gastro renal shunt or gastric varices. 4. Replaced right hepatic artery. 5.  Aortic Atherosclerosis (ICD10-170.0). NON-VASCULAR 1. Small focus of  non-masslike arterial hyperenhancement in hepatic segment 3 measures up to 1.4 cm but demonstrates no definitive washout on the portal venous phase imaging. Given the non masslike appearance, this  is favored to represent a small transient hepatic attenuation difference due to a vascular anomaly. However, in the setting of cirrhosis, early hepatocellular carcinoma is difficult to exclude entirely. Recommend further evaluation with MRI of the abdomen enhanced with gadolinium contrast. 2. Large ascites extending through a right inguinal hernia into the scrotum. 3. Small right pleural effusion and associated atelectasis. 4. Colonic diverticular disease without CT evidence of active inflammation. 5. Lower lumbar degenerative disc disease and facet arthropathy. Signed, Criselda Peaches, MD, [facility] Radiology Electronically Signed   By: Jacqulynn Cadet M.D.   On: 06/07/2019 15:11        Subjective: Patient reports feeling well. Denies shortness of breath, abdominal pain, lightheadedness.   Discharge Exam: Vitals:   06/22/19 2130 06/23/19 0546  BP: (!) 154/58 131/60  Pulse: 81 84  Resp: 16 16  Temp: 98.2 F (36.8 C) 98.5 F (36.9 C)  SpO2: 99% 100%   Vitals:   06/22/19 1459 06/22/19 1742 06/22/19 2130 06/23/19 0546  BP: (!) 144/74 (!) 149/67 (!) 154/58 131/60  Pulse: 85 97 81 84  Resp: 20 18 16 16   Temp: 98 F (36.7 C) 98.2 F (36.8 C) 98.2 F (36.8 C) 98.5 F (36.9 C)  TempSrc: Oral Oral Oral Oral  SpO2: 100% 99% 99% 100%  Weight:      Height:        General: Pt is alert, awake, not in acute distress Cardiovascular: RRR Respiratory: No respiratory distress, no cough.  Abdominal: Soft, NT, mild distention.  Extremities: trace pitting edema bilaterally, no cyanosis    The results of significant diagnostics from this hospitalization (including imaging, microbiology, ancillary and laboratory) are listed below for  reference.     Microbiology: Recent Results (from the past 240 hour(s))  SARS CORONAVIRUS 2 (TAT 6-24 HRS) Nasopharyngeal Nasopharyngeal Swab     Status: None   Collection Time: 06/18/19  6:06 AM   Specimen: Nasopharyngeal Swab  Result Value Ref Range Status   SARS Coronavirus 2 NEGATIVE NEGATIVE Final    Comment: (NOTE) SARS-CoV-2 target nucleic acids are NOT DETECTED. The SARS-CoV-2 RNA is generally detectable in upper and lower respiratory specimens during the acute phase of infection. Negative results do not preclude SARS-CoV-2 infection, do not rule out co-infections with other pathogens, and should not be used as the sole basis for treatment or other patient management decisions. Negative results must be combined with clinical observations, patient history, and epidemiological information. The expected result is Negative. Fact Sheet for Patients: SugarRoll.be Fact Sheet for Healthcare Providers: https://www.woods-mathews.com/ This test is not yet approved or cleared by the [place] FDA and  has been authorized for detection and/or diagnosis of SARS-CoV-2 by FDA under an Emergency Use Authorization (EUA). This EUA will remain  in effect (meaning this test can be used) for the duration of the COVID-19 declaration under Section 56 4(b)(1) of the Act, 21 U.S.C. section 360bbb-3(b)(1), unless the authorization is terminated or revoked sooner. Performed at [facility], [place] [address] [place], [place] [phone]   Culture, body fluid-bottle     Status: None (Preliminary result)   Collection Time: 06/19/19  1:41 PM   Specimen: Fluid  Result Value Ref Range Status   Specimen Description FLUID  Final   Special Requests NONE  Final   Culture   Final    NO GROWTH 4 DAYS Performed at [facility] [address] [place], [place] [address]  Report Status PENDING  Incomplete  Gram stain     Status: None   Collection  Time: 06/19/19  1:41 PM   Specimen: Fluid  Result Value Ref Range Status   Specimen Description FLUID  Final   Special Requests NONE  Final   Gram Stain   Final    CYTOSPIN SMEAR WBC PRESENT, PREDOMINANTLY MONONUCLEAR NO ORGANISMS SEEN Performed at [facility], [address], [place], [place] [address]    Report Status 06/20/2019 FINAL  Final     Labs: BNP (last 3 results) No results for input(s): BNP in the last 8760 hours. Basic Metabolic Panel: Recent Labs  Lab 06/17/19 2234  06/19/19 0511 06/20/19 0536 06/21/19 0502 06/22/19 0538 06/23/19 0538  NA 129*   < > 134* 132* 135 135 137  K 4.0   < > 4.7 4.1 3.5 3.4* 3.2*  CL 101   < > 102 100 102 103 104  CO2 17*   < > 18* 19* 20* 22 21*  GLUCOSE 102*   < > 84 102* 111* 106* 116*  BUN 48*   < > 51* 55* 52* 49* 45*  CREATININE 2.67*   < > 2.97* 3.22* 2.93* 2.75* 2.30*  CALCIUM 8.0*   < > 9.0 8.5* 8.4* 8.4* 8.9  MG 1.9  --   --   --   --   --   --   PHOS 4.5  --   --   --   --  3.2  --    < > = values in this interval not displayed.   Liver Function Tests: Recent Labs  Lab 06/17/19 2234  AST 36  ALT 27  ALKPHOS 112  BILITOT 0.8  PROT 5.5*  ALBUMIN 2.2*   No results for input(s): LIPASE, AMYLASE in the last 168 hours. No results for input(s): AMMONIA in the last 168 hours. CBC: Recent Labs  Lab 06/18/19 0534 06/19/19 0511 06/22/19 0538 06/22/19 2101 06/23/19 0538  WBC 5.9 10.2 4.7  --  5.0  HGB 7.9* 7.7* 6.7* 8.3* 8.4*  HCT 26.5* 25.4* 21.7* 26.8* 27.4*  MCV 97.8 97.3 96.4  --  95.5  PLT 98* 95* 79*  --  78*   Cardiac Enzymes: No results for input(s): CKTOTAL, CKMB, CKMBINDEX, TROPONINI in the last 168 hours. BNP: Invalid input(s): POCBNP CBG: No results for input(s): GLUCAP in the last 168 hours. D-Dimer No results for input(s): DDIMER in the last 72 hours. Hgb A1c No results for input(s): HGBA1C in the last 72 hours. Lipid Profile No results for input(s): CHOL, HDL, LDLCALC, TRIG,  CHOLHDL, LDLDIRECT in the last 72 hours. Thyroid function studies No results for input(s): TSH, T4TOTAL, T3FREE, THYROIDAB in the last 72 hours.  Invalid input(s): FREET3 Anemia work up No results for input(s): VITAMINB12, FOLATE, FERRITIN, TIBC, IRON, RETICCTPCT in the last 72 hours. Urinalysis    Component Value Date/Time   COLORURINE YELLOW 06/18/2019 0746   APPEARANCEUR CLEAR 06/18/2019 0746   LABSPEC 1.018 06/18/2019 0746   PHURINE 5.0 06/18/2019 0746   GLUCOSEU NEGATIVE 06/18/2019 0746   HGBUR NEGATIVE 06/18/2019 0746   BILIRUBINUR NEGATIVE 06/18/2019 0746   KETONESUR NEGATIVE 06/18/2019 0746   PROTEINUR NEGATIVE 06/18/2019 0746   UROBILINOGEN 0.2 01/06/2012 0855   NITRITE NEGATIVE 06/18/2019 0746   LEUKOCYTESUR NEGATIVE 06/18/2019 0746   Sepsis Labs Invalid input(s): PROCALCITONIN,  WBC,  LACTICIDVEN Microbiology Recent Results (from the past 240 hour(s))  SARS CORONAVIRUS 2 (TAT 6-24 HRS) Nasopharyngeal Nasopharyngeal  Swab     Status: None   Collection Time: 06/18/19  6:06 AM   Specimen: Nasopharyngeal Swab  Result Value Ref Range Status   SARS Coronavirus 2 NEGATIVE NEGATIVE Final    Comment: (NOTE) SARS-CoV-2 target nucleic acids are NOT DETECTED. The SARS-CoV-2 RNA is generally detectable in upper and lower respiratory specimens during the acute phase of infection. Negative results do not preclude SARS-CoV-2 infection, do not rule out co-infections with other pathogens, and should not be used as the sole basis for treatment or other patient management decisions. Negative results must be combined with clinical observations, patient history, and epidemiological information. The expected result is Negative. Fact Sheet for Patients: SugarRoll.be Fact Sheet for Healthcare Providers: https://www.woods-mathews.com/ This test is not yet approved or cleared by the [place] FDA and  has been authorized for detection and/or  diagnosis of SARS-CoV-2 by FDA under an Emergency Use Authorization (EUA). This EUA will remain  in effect (meaning this test can be used) for the duration of the COVID-19 declaration under Section 56 4(b)(1) of the Act, 21 U.S.C. section 360bbb-3(b)(1), unless the authorization is terminated or revoked sooner. Performed at [facility], [place] [address] [place], [place] [address]   Culture, body fluid-bottle     Status: None (Preliminary result)   Collection Time: 06/19/19  1:41 PM   Specimen: Fluid  Result Value Ref Range Status   Specimen Description FLUID  Final   Special Requests NONE  Final   Culture   Final    NO GROWTH 4 DAYS Performed at [facility] [address], [place], [place] [address]    Report Status PENDING  Incomplete  Gram stain     Status: None   Collection Time: 06/19/19  1:41 PM   Specimen: Fluid  Result Value Ref Range Status   Specimen Description FLUID  Final   Special Requests NONE  Final   Gram Stain   Final    CYTOSPIN SMEAR WBC PRESENT, PREDOMINANTLY MONONUCLEAR NO ORGANISMS SEEN Performed at [facility], [place] [address], [place],  [address]    Report Status 06/20/2019 FINAL  Final     Time coordinating discharge: Over 33 minutes  SIGNED:   Blain Pais, MD  [facility] 06/23/2019, 11:50 AM   If 7PM-7AM, please contact night-coverage [email or web address] Password [email or web address]

## 2019-06-24 LAB — CULTURE, BODY FLUID W GRAM STAIN -BOTTLE: Culture: NO GROWTH

## 2019-06-25 ENCOUNTER — Other Ambulatory Visit: Payer: Self-pay

## 2019-06-25 ENCOUNTER — Encounter: Payer: Self-pay | Admitting: Internal Medicine

## 2019-06-25 ENCOUNTER — Ambulatory Visit (INDEPENDENT_AMBULATORY_CARE_PROVIDER_SITE_OTHER): Payer: 59 | Admitting: Internal Medicine

## 2019-06-25 VITALS — BP 130/86 | HR 102 | Temp 97.6°F | Ht 75.0 in | Wt 170.0 lb

## 2019-06-25 DIAGNOSIS — D692 Other nonthrombocytopenic purpura: Secondary | ICD-10-CM | POA: Diagnosis not present

## 2019-06-25 DIAGNOSIS — T300 Burn of unspecified body region, unspecified degree: Secondary | ICD-10-CM | POA: Diagnosis not present

## 2019-06-25 DIAGNOSIS — K7031 Alcoholic cirrhosis of liver with ascites: Secondary | ICD-10-CM | POA: Diagnosis not present

## 2019-06-25 DIAGNOSIS — N183 Chronic kidney disease, stage 3 unspecified: Secondary | ICD-10-CM

## 2019-06-25 DIAGNOSIS — N171 Acute kidney failure with acute cortical necrosis: Secondary | ICD-10-CM

## 2019-06-25 LAB — CBC
HCT: 26.7 % — ABNORMAL LOW (ref 39.0–52.0)
Hemoglobin: 8.6 g/dL — ABNORMAL LOW (ref 13.0–17.0)
MCHC: 32.2 g/dL (ref 30.0–36.0)
MCV: 92.2 fl (ref 78.0–100.0)
Platelets: 109 10*3/uL — ABNORMAL LOW (ref 150.0–400.0)
RBC: 2.9 Mil/uL — ABNORMAL LOW (ref 4.22–5.81)
RDW: 18.4 % — ABNORMAL HIGH (ref 11.5–15.5)
WBC: 6.3 10*3/uL (ref 4.0–10.5)

## 2019-06-25 LAB — RENAL FUNCTION PANEL
Albumin: 4.1 g/dL (ref 3.5–5.2)
BUN: 39 mg/dL — ABNORMAL HIGH (ref 6–23)
CO2: 24 mEq/L (ref 19–32)
Calcium: 9 mg/dL (ref 8.4–10.5)
Chloride: 102 mEq/L (ref 96–112)
Creatinine, Ser: 2.05 mg/dL — ABNORMAL HIGH (ref 0.40–1.50)
GFR: 32.15 mL/min — ABNORMAL LOW (ref >60.00)
Glucose, Bld: 123 mg/dL — ABNORMAL HIGH (ref 70–99)
Phosphorus: 3 mg/dL (ref 2.3–4.6)
Potassium: 3.4 mEq/L — ABNORMAL LOW (ref 3.5–5.1)
Sodium: 136 mEq/L (ref 135–145)

## 2019-06-25 MED ORDER — HYDROCODONE-ACETAMINOPHEN 5-325 MG PO TABS: 1.0000 | ORAL_TABLET | Freq: Three times a day (TID) | ORAL | 0 refills | Status: AC | PRN

## 2019-06-25 MED ORDER — LACTULOSE 10 GM/15ML PO SOLN: 20.0000 g | Freq: Every day | ORAL | 5 refills | Status: AC

## 2019-06-25 MED ORDER — LORAZEPAM 0.5 MG PO TABS: 0.2500 mg | ORAL_TABLET | Freq: Two times a day (BID) | ORAL | 0 refills | Status: AC | PRN

## 2019-06-25 NOTE — Assessment & Plan Note (Signed)
I am not sure this is hepatorenal syndrome  Heavy diuretics/IV contrast, etc---could be the cause Not clearly increasing since admission and not really better with the inpatient Rx Will recheck now Continue palliative care----if creatinine increasing, consider hospice

## 2019-06-25 NOTE — Assessment & Plan Note (Signed)
This seems to be the most pressing problem Will need to adjust diuretics carefully (mostly for scrotum now)

## 2019-06-25 NOTE — Progress Notes (Signed)
Subjective:    Patient ID: Billy [name], male    DOB: 03/23/1946, 73 y.o.   MRN: [id number]  HPI Here for hospital follow up--with wife Billy [name]  This visit occurred during the SARS-CoV-2 public health emergency.  Safety protocols were in place, including screening questions prior to the visit, additional usage of staff PPE, and extensive cleaning of exam room while observing appropriate contact time as indicated for disinfecting solutions.   Reviewed extensive hospital records Concern about possible hepatorenal syndrome Reviewed 1 year of his labs---and creatinine up over 2 at times. Had CT angiogram during the prior hospitalization Also extensive diuretics  Admitted due to increased creatinine Given albumin, midodrine (still on) and octeotride IV furosemide Paracentesis to check for peritonitis---culture negative  Has been walking slow No mental status changes per wife---hearing does affect (certainly not striking) Breathing is okay  Ascites is reaccumulating Increased scotal swelling--with some draining (and some blood today)  Got burn from hot soup in flimsy cup while in ER Burned and still painful  Feet are swelling Were like "balloon feet" Went down but now increasing again Slowly getting back into his legs  Current Outpatient Medications on File Prior to Visit  Medication Sig Dispense Refill  . buPROPion (WELLBUTRIN SR) 150 MG 12 hr tablet TAKE 1 TABLET BY MOUTH EVERY DAY 90 tablet 3  . ferrous sulfate 325 (65 FE) MG EC tablet Take 325 mg by mouth every other day.    . fluocinonide cream (LIDEX) [id number] % Apply 1 application topically 2 (two) times daily as needed (for skin rash/irritation). APPLY ON THE SKIN TWICE A DAY AS NEEDED FOR RASH 60 g 2  . furosemide (LASIX) 40 MG tablet Take 1 tablet (40 mg total) by mouth 2 (two) times daily. 60 tablet 0  . HYDROcodone-acetaminophen (NORCO/VICODIN) 5-325 MG tablet Take 1 tablet by mouth 3 (three) times daily as needed (for  pain.). 90 tablet 0  . lactulose (CHRONULAC) 10 GM/15ML solution Take 30 mLs (20 g total) by mouth daily. Please keep 3-4 bowel movement daily, please call your GI doctor if your bm less than three times a day or more than 4 times a day. Your GI doctor will help you adjust lactulose dose. 236 mL 0  . midodrine (PROAMATINE) 2.5 MG tablet Take 3 tablets (7.5 mg total) by mouth 3 (three) times daily with meals. 90 tablet 0  . mirtazapine (REMERON) 45 MG tablet TAKE 1 TABLET (45 MG TOTAL) BY MOUTH AT BEDTIME. 90 tablet 3  . Multiple Vitamin (MULTIVITAMIN WITH MINERALS) TABS Take 1 tablet by mouth daily.     . pantoprazole (PROTONIX) 40 MG tablet Take 1 tablet (40 mg total) by mouth 2 (two) times daily before a meal. 60 tablet 0  . silver sulfADIAZINE (SILVADENE) 1 % cream Apply topically 2 (two) times daily. To left thigh 50 g 0  . sodium bicarbonate 650 MG tablet Take 1 tablet (650 mg total) by mouth 3 (three) times daily. 90 tablet 0  . vitamin C (ASCORBIC ACID) 250 MG tablet Take 250 mg by mouth daily.      No current facility-administered medications on file prior to visit.    Allergies  Allergen Reactions  . Yellow Jacket Venom Anaphylaxis, Shortness Of Breath and Other (See Comments)    Throat closes up/eye swell shut  . Nsaids Other (See Comments)    Stomach pain, Bleeding risk    Past Medical History:  Diagnosis Date  . Acute deep vein thrombosis (DVT)  of femoral vein of right lower extremity ([place]) 03/30/2019  . Acute posthemorrhagic anemia   . Alcoholic cirrhosis of liver ([place]) 09/24/2011   Years of alcohol use. Progressed from fatty liver to cirrhosis based upon review imaging. Manifestations are portal hypertension with esophageal varices, portal gastropathy, rectal varices and he may have portal colopathy. Ascites also present. Naive to HAV and HBV Claims he has had pneumococcal vaccine   . Alcoholism ([place])   . Angiodysplasia of colon 09/11/2011   Multiple in the right colon,  ablated with APC. Question if this is portal colopathy.   . Anxiety   . Back pain   . Basal cell carcinoma   . Bilateral varicoceles   . Campylobacter diarrhea 01/07/2012  . Cataract    removed  . DDD (degenerative disc disease), lumbar   . Depression    mild at most  . Diverticulosis of colon   . Erectile dysfunction   . Gastric ulcer 08/2011   small, antral ulcer  . GERD (gastroesophageal reflux disease)    takes Omeprazole prn  . GI hemorrhage 2006, 2013   esophagitis and 1+ varices, melena in 2013   . Grade I diastolic dysfunction   . Hearing loss   . Hepatorenal syndrome (HCC)-Type 2 06/14/2019  . History of ascites   . History of blood transfusion    as a child and no abnormal reaction noted  . History of bronchitis    >42yrs ago  . History of dizziness   . Hydrocele   . Hypertension    takes Nadolol daily  . Narcotic dependence ([place])   . Personal history of colonic polyps   . Portal hypertension ([place]) 08/2011  . 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 Right hydrocele   . Scrotal edema   . Sleep disturbance    takes Remeron nightly  . Splenomegaly   . Thrombocytopenia, unspecified ([place])   . 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 BIOPSY  08/27/2018   Procedure: BIOPSY;  Surgeon: Milus Banister, MD;  Location: [facility];  Service: Endoscopy;;  . CATARACT EXTRACTION  2013   bilateral with IOL  . CHOLECYSTECTOMY  12/31/2012   Procedure: LAPAROSCOPIC CHOLECYSTECTOMY;  Surgeon: Zenovia Jarred, MD;  Location: [facility];  Service: General;;  . COLONOSCOPY  2001, 200411/02/2006  . COLONOSCOPY  09/11/2011   Procedure: COLONOSCOPY;  Surgeon: Gatha Mayer, MD;  Location: [facility];  Service: Endoscopy;  Laterality: N/A;  . COLONOSCOPY    . COLONOSCOPY WITH PROPOFOL N/A 06/07/2019   Procedure: COLONOSCOPY WITH PROPOFOL;  Surgeon: Ladene Artist, MD;  Location: [facility];  Service: Endoscopy;   Laterality: N/A;  . ESOPHAGEAL BANDING N/A 05/02/2017   Procedure: ESOPHAGEAL BANDING;  Surgeon: Gatha Mayer, MD;  Location: [facility];  Service: Endoscopy;  Laterality: N/A;  . ESOPHAGEAL BANDING  06/09/2018   Procedure: ESOPHAGEAL BANDING;  Surgeon: Gatha Mayer, MD;  Location: [facility];  Service: Endoscopy;;  . ESOPHAGEAL BANDING  08/27/2018   Procedure: ESOPHAGEAL BANDING;  Surgeon: Milus Banister, MD;  Location: [facility];  Service: Endoscopy;;  . ESOPHAGOGASTRODUODENOSCOPY  2006  . ESOPHAGOGASTRODUODENOSCOPY  09/11/2011   Procedure: ESOPHAGOGASTRODUODENOSCOPY (EGD);  Surgeon: Gatha Mayer, MD;  [facility];  Service: Endoscopy;  Laterality: N/A;  . ESOPHAGOGASTRODUODENOSCOPY N/A 08/27/2018   Procedure: ESOPHAGOGASTRODUODENOSCOPY (EGD);  Surgeon: Milus Banister, MD;  Location[facility];  Service: Endoscopy;  Laterality: N/A;  .  ESOPHAGOGASTRODUODENOSCOPY (EGD) WITH PROPOFOL N/A 04/13/2017   Procedure: ESOPHAGOGASTRODUODENOSCOPY (EGD) WITH PROPOFOL;  Surgeon: Doran Stabler, MD;  [facility];  Service: Gastroenterology;  Laterality: N/A;  . ESOPHAGOGASTRODUODENOSCOPY (EGD) WITH PROPOFOL N/A 05/02/2017   Procedure: ESOPHAGOGASTRODUODENOSCOPY (EGD) WITH PROPOFOL;  Surgeon: Gatha Mayer, MD;  [facility];  Service: Endoscopy;  Laterality: N/A;  . ESOPHAGOGASTRODUODENOSCOPY (EGD) WITH PROPOFOL N/A 09/30/2017   Procedure: ESOPHAGOGASTRODUODENOSCOPY (EGD) WITH PROPOFOL;  Surgeon: Gatha Mayer, MD;  [facility];  Service: Endoscopy;  Laterality: N/A;  . ESOPHAGOGASTRODUODENOSCOPY (EGD) WITH PROPOFOL N/A 06/09/2018   Procedure: ESOPHAGOGASTRODUODENOSCOPY (EGD) WITH PROPOFOL;  Surgeon: Gatha Mayer, MD;  [facility];  Service: Endoscopy;  Laterality: N/A;  . ESOPHAGOGASTRODUODENOSCOPY (EGD) WITH PROPOFOL N/A 01/05/2019   Procedure: ESOPHAGOGASTRODUODENOSCOPY (EGD) WITH PROPOFOL;  Surgeon: Gatha Mayer, MD;   [facility];  Service: Endoscopy;  Laterality: N/A;  . ESOPHAGOGASTRODUODENOSCOPY (EGD) WITH PROPOFOL N/A 05/30/2019   Procedure: ESOPHAGOGASTRODUODENOSCOPY (EGD) WITH PROPOFOL;  Surgeon: [name] Ada, MD;  [facility];  Service: Endoscopy;  Laterality: N/A;  . ESOPHAGOGASTRODUODENOSCOPY (EGD) WITH PROPOFOL N/A 06/07/2019   Procedure: ESOPHAGOGASTRODUODENOSCOPY (EGD) WITH PROPOFOL;  Surgeon: Ladene Artist, MD;  [facility];  Service: Endoscopy;  Laterality: N/A;  . EUS N/A 08/27/2018   Procedure: UPPER ENDOSCOPIC ULTRASOUND (EUS) RADIAL;  Surgeon: Milus Banister, MD;  Location[facility];  Service: Endoscopy;  Laterality: N/A;  . EYE SURGERY    . FLEXIBLE SIGMOIDOSCOPY  01/07/2012   Procedure: FLEXIBLE SIGMOIDOSCOPY;  Surgeon: Lafayette Dragon, MD;  [facility];  Service: Endoscopy;  Laterality: N/A;  . HOT HEMOSTASIS N/A 06/07/2019   Procedure: HOT HEMOSTASIS (ARGON PLASMA COAGULATION/BICAP);  Surgeon: Ladene Artist, MD;  [facility];  Service: Endoscopy;  Laterality: N/A;  . IR IVC FILTER PLMT / S&I Burke Keels GUID/MOD SED  06/07/2019  . IR RADIOLOGIST EVAL & MGMT  05/26/2019  . KNEE ARTHROSCOPY  2008   Right  . KNEE ARTHROSCOPY  2008   Left  . POLYPECTOMY    . [place] THORA/PARACENTESIS  10/28/2011  . Varicocele repair  96's    Family History  Problem Relation Age of Onset  . Stroke Mother   . Diabetes Mother   . Lung cancer Brother   . Prostate cancer Brother   . Liver cancer Cousin   . Liver cancer Paternal Uncle   . Colon cancer Neg Hx   . Stomach cancer Neg Hx   . Esophageal cancer Neg Hx   . Colon polyps Neg Hx   . Rectal cancer Neg Hx   . Pancreatic cancer Neg Hx     Social History   Socioeconomic History  . Marital status: Married    Spouse name: Not on file  . Number of children: 1  . Years of education: Not on file  . Highest education level: Not on file  Occupational History  . Occupation: Retired     Comment: mostly from Scientist, research (medical)  Tobacco Use  . Smoking status: Light Tobacco Smoker    Packs/day: 0.50    Types: Cigarettes, E-cigarettes  . Smokeless tobacco: Never Used  Substance and Sexual Activity  . Alcohol use: Not Currently    Alcohol/week: 0.0 standard drinks  . Drug use: Yes    Types: Marijuana    Comment: occasionally, last time couple of weeks ago  . Sexual activity: Never  Other Topics Concern  . Not on file  Social History Narrative   Married- 2nd  1 son (adopted) from previous marriage      Has living will   Wife is  health care POA---alternate is son   Would accept attempts at resuscitation   No tube feeds if cognitively unaware      Social Determinants of Health   Financial Resource Strain:   . Difficulty of Paying Living Expenses: Not on file  Food Insecurity:   . Worried About Charity fundraiser in the Last Year: Not on file  . Ran Out of Food in the Last Year: Not on file  Transportation Needs:   . Lack of Transportation (Medical): Not on file  . Lack of Transportation (Non-Medical): Not on file  Physical Activity:   . Days of Exercise per Week: Not on file  . Minutes of Exercise per Session: Not on file  Stress:   . Feeling of Stress : Not on file  Social Connections:   . Frequency of Communication with Friends and Family: Not on file  . Frequency of Social Gatherings with Friends and Family: Not on file  . Attends Religious Services: Not on file  . Active Member of Clubs or Organizations: Not on file  . Attends Archivist Meetings: Not on file  . Marital Status: Not on file  Intimate Partner Violence:   . Fear of Current or Ex-Partner: Not on file  . Emotionally Abused: Not on file  . Physically Abused: Not on file  . Sexually Abused: Not on file   Review of Systems Appetite is good Not sleeping well----very stressed about the fears of dying Ativan did help anxiety while in the hospital Still uses the hydrocodone for his  back Now on lactulose    Objective:   Physical Exam  Constitutional: No distress.  Neck: No thyromegaly present.  Cardiovascular: Normal rate and regular rhythm. Exam reveals no gallop.  Gr [id number] systolic murmur along left sternal border  GI: There is no abdominal tenderness. There is no rebound and no guarding.  Moderate ascites without tenderness  Genitourinary:    Genitourinary Comments: Massive scrotal edema with redness at base  No clear infection   Musculoskeletal:     Comments: 1+ edema in feet/calves  Lymphadenopathy:    He has no cervical adenopathy.  Skin:  ~2 x 6cm oval second degree burn on upper left thigh Widespread ecchymotic areas---especially on back           Assessment & Plan:

## 2019-06-25 NOTE — Assessment & Plan Note (Signed)
Low platelets/cirrhosis, etc

## 2019-06-25 NOTE — Assessment & Plan Note (Signed)
Partial thickness No infection now Continue the silvadene

## 2019-06-30 ENCOUNTER — Other Ambulatory Visit: Payer: Self-pay | Admitting: Internal Medicine

## 2019-07-01 ENCOUNTER — Encounter: Payer: Self-pay | Admitting: Internal Medicine

## 2019-07-02 ENCOUNTER — Ambulatory Visit (INDEPENDENT_AMBULATORY_CARE_PROVIDER_SITE_OTHER): Payer: 59 | Admitting: Internal Medicine

## 2019-07-02 ENCOUNTER — Other Ambulatory Visit: Payer: Self-pay

## 2019-07-02 ENCOUNTER — Encounter: Payer: Self-pay | Admitting: Internal Medicine

## 2019-07-02 DIAGNOSIS — K7031 Alcoholic cirrhosis of liver with ascites: Secondary | ICD-10-CM

## 2019-07-02 DIAGNOSIS — F1120 Opioid dependence, uncomplicated: Secondary | ICD-10-CM | POA: Diagnosis not present

## 2019-07-02 DIAGNOSIS — N189 Chronic kidney disease, unspecified: Secondary | ICD-10-CM | POA: Diagnosis not present

## 2019-07-02 DIAGNOSIS — N179 Acute kidney failure, unspecified: Secondary | ICD-10-CM

## 2019-07-02 MED ORDER — MIDODRINE HCL 2.5 MG PO TABS: 7.5000 mg | ORAL_TABLET | Freq: Three times a day (TID) | ORAL | 3 refills | Status: AC

## 2019-07-02 NOTE — Assessment & Plan Note (Signed)
PDMP reviewed Continue the tid hydrocodone

## 2019-07-02 NOTE — Progress Notes (Signed)
Subjective:    Patient ID: Billy [name], male    DOB: 09-Dec-1945, 73 y.o.   MRN: [id number]  HPI Here for 1 week follow up after hospital  This visit occurred during the SARS-CoV-2 public health emergency.  Safety protocols were in place, including screening questions prior to the visit, additional usage of staff PPE, and extensive cleaning of exam room while observing appropriate contact time as indicated for disinfecting solutions.   Feels good Driving short distances Appetite is excellent More energy  Breathing is fine No dizziness on  Hasn't been taking the lactulose---it was keeping him in the bathroom with stools  Same hydrocodone ---tid Helps his back pain  Current Outpatient Medications on File Prior to Visit  Medication Sig Dispense Refill  . buPROPion (WELLBUTRIN SR) 150 MG 12 hr tablet TAKE 1 TABLET BY MOUTH EVERY DAY 90 tablet 3  . ferrous sulfate 325 (65 FE) MG EC tablet Take 325 mg by mouth every other day.    . fluocinonide cream (LIDEX) [id number] % Apply 1 application topically 2 (two) times daily as needed (for skin rash/irritation). APPLY ON THE SKIN TWICE A DAY AS NEEDED FOR RASH 60 g 2  . furosemide (LASIX) 40 MG tablet Take 1 tablet (40 mg total) by mouth 2 (two) times daily. 60 tablet 0  . HYDROcodone-acetaminophen (NORCO/VICODIN) 5-325 MG tablet Take 1 tablet by mouth 3 (three) times daily as needed (for pain.). 90 tablet 0  . lactulose (CHRONULAC) 10 GM/15ML solution Take 30 mLs (20 g total) by mouth daily. Please keep 3-4 bowel movement daily, please call your GI doctor if your bm less than three times a day or more than 4 times a day. Your GI doctor will help you adjust lactulose dose. 236 mL 5  . LORazepam (ATIVAN) 0.5 MG tablet Take 0.5-1 tablets (0.25-0.5 mg total) by mouth 2 (two) times daily as needed for anxiety. 30 tablet 0  . mirtazapine (REMERON) 45 MG tablet TAKE 1 TABLET (45 MG TOTAL) BY MOUTH AT BEDTIME. 90 tablet 3  . Multiple Vitamin  (MULTIVITAMIN WITH MINERALS) TABS Take 1 tablet by mouth daily.     . pantoprazole (PROTONIX) 40 MG tablet Take 1 tablet (40 mg total) by mouth 2 (two) times daily before a meal. 60 tablet 0  . silver sulfADIAZINE (SILVADENE) 1 % cream Apply topically 2 (two) times daily. To left thigh 50 g 0  . sodium bicarbonate 650 MG tablet Take 1 tablet (650 mg total) by mouth 3 (three) times daily. 90 tablet 0  . vitamin C (ASCORBIC ACID) 250 MG tablet Take 250 mg by mouth daily.      No current facility-administered medications on file prior to visit.    Allergies  Allergen Reactions  . Yellow Jacket Venom Anaphylaxis, Shortness Of Breath and Other (See Comments)    Throat closes up/eye swell shut  . Nsaids Other (See Comments)    Stomach pain, Bleeding risk    Past Medical History:  Diagnosis Date  . Acute deep vein thrombosis (DVT) of femoral vein of right lower extremity ([place]) 03/30/2019  . Acute posthemorrhagic anemia   . Alcoholic cirrhosis of liver ([place]) 09/24/2011   Years of alcohol use. Progressed from fatty liver to cirrhosis based upon review imaging. Manifestations are portal hypertension with esophageal varices, portal gastropathy, rectal varices and he may have portal colopathy. Ascites also present. Naive to HAV and HBV Claims he has had pneumococcal vaccine   . Alcoholism ([place])   .  Angiodysplasia of colon 09/11/2011   Multiple in the right colon, ablated with APC. Question if this is portal colopathy.   . Anxiety   . Back pain   . Basal cell carcinoma   . Bilateral varicoceles   . Campylobacter diarrhea 01/07/2012  . Cataract    removed  . DDD (degenerative disc disease), lumbar   . Depression    mild at most  . Diverticulosis of colon   . Erectile dysfunction   . Gastric ulcer 08/2011   small, antral ulcer  . GERD (gastroesophageal reflux disease)    takes Omeprazole prn  . GI hemorrhage 2006, 2013   esophagitis and 1+ varices, melena in 2013   . Grade I diastolic  dysfunction   . Hearing loss   . Hepatorenal syndrome (HCC)-Type 2 06/14/2019  . History of ascites   . History of blood transfusion    as a child and no abnormal reaction noted  . History of bronchitis    >52yrs ago  . History of dizziness   . Hydrocele   . Hypertension    takes Nadolol daily  . Narcotic dependence ([place])   . Personal history of colonic polyps   . Portal hypertension ([place]) 08/2011  . 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 Right hydrocele   . Scrotal edema   . Sleep disturbance    takes Remeron nightly  . Splenomegaly   . Thrombocytopenia, unspecified ([place])   . Varices, esophageal ([place]) 08/2011   Grade 2, mid-esophagus    Past Surgical History:  Procedure Laterality Date  . BIOPSY  08/27/2018   Procedure: BIOPSY;  Surgeon: Milus Banister, MD;  Location: [facility];  Service: Endoscopy;;  . CATARACT EXTRACTION  2013   bilateral with IOL  . CHOLECYSTECTOMY  12/31/2012   Procedure: LAPAROSCOPIC CHOLECYSTECTOMY;  Surgeon: Zenovia Jarred, MD;  Location: [facility];  Service: General;;  . COLONOSCOPY  2001, 200411/02/2006  . COLONOSCOPY  09/11/2011   Procedure: COLONOSCOPY;  Surgeon: Gatha Mayer, MD;  Location: [facility];  Service: Endoscopy;  Laterality: N/A;  . COLONOSCOPY    . COLONOSCOPY WITH PROPOFOL N/A 06/07/2019   Procedure: COLONOSCOPY WITH PROPOFOL;  Surgeon: Ladene Artist, MD;  Location: [facility];  Service: Endoscopy;  Laterality: N/A;  . ESOPHAGEAL BANDING N/A 05/02/2017   Procedure: ESOPHAGEAL BANDING;  Surgeon: Gatha Mayer, MD;  Location: [facility];  Service: Endoscopy;  Laterality: N/A;  . ESOPHAGEAL BANDING  06/09/2018   Procedure: ESOPHAGEAL BANDING;  Surgeon: Gatha Mayer, MD;  Location: [facility];  Service: Endoscopy;;  . ESOPHAGEAL BANDING  08/27/2018   Procedure: ESOPHAGEAL BANDING;  Surgeon: Milus Banister, MD;  Location: [facility];  Service: Endoscopy;;  . ESOPHAGOGASTRODUODENOSCOPY  2006  . ESOPHAGOGASTRODUODENOSCOPY   09/11/2011   Procedure: ESOPHAGOGASTRODUODENOSCOPY (EGD);  Surgeon: Gatha Mayer, MD;  Location: [facility];  Service: Endoscopy;  Laterality: N/A;  . ESOPHAGOGASTRODUODENOSCOPY N/A 08/27/2018   Procedure: ESOPHAGOGASTRODUODENOSCOPY (EGD);  Surgeon: Milus Banister, MD;  Location: [facility];  Service: Endoscopy;  Laterality: N/A;  . ESOPHAGOGASTRODUODENOSCOPY (EGD) WITH PROPOFOL N/A 04/13/2017   Procedure: ESOPHAGOGASTRODUODENOSCOPY (EGD) WITH PROPOFOL;  Surgeon: Doran Stabler, MD;  [facility]: [facility];  Service: Gastroenterology;  Laterality: N/A;  . ESOPHAGOGASTRODUODENOSCOPY (EGD) WITH PROPOFOL N/A 05/02/2017   Procedure: ESOPHAGOGASTRODUODENOSCOPY (EGD) WITH PROPOFOL;  Surgeon: Gatha Mayer, MD;  [facility];  Service: Endoscopy;  Laterality: N/A;  . ESOPHAGOGASTRODUODENOSCOPY (EGD) WITH PROPOFOL N/A 09/30/2017   Procedure: ESOPHAGOGASTRODUODENOSCOPY (EGD) WITH PROPOFOL;  Surgeon: Gatha Mayer, MD;  Location: [facility];  Service: Endoscopy;  Laterality: N/A;  . ESOPHAGOGASTRODUODENOSCOPY (EGD) WITH PROPOFOL N/A 06/09/2018   Procedure: ESOPHAGOGASTRODUODENOSCOPY (EGD) WITH PROPOFOL;  Surgeon: Gatha Mayer, MD;  [facility];  Service: Endoscopy;  Laterality: N/A;  . ESOPHAGOGASTRODUODENOSCOPY (EGD) WITH PROPOFOL N/A 01/05/2019   Procedure: ESOPHAGOGASTRODUODENOSCOPY (EGD) WITH PROPOFOL;  Surgeon: Gatha Mayer, MD;  [facility];  Service: Endoscopy;  Laterality: N/A;  . ESOPHAGOGASTRODUODENOSCOPY (EGD) WITH PROPOFOL N/A 05/30/2019   Procedure: ESOPHAGOGASTRODUODENOSCOPY (EGD) WITH PROPOFOL;  Surgeon: [name] Ada, MD;  [facility]: [facility];  Service: Endoscopy;  Laterality: N/A;  . ESOPHAGOGASTRODUODENOSCOPY (EGD) WITH PROPOFOL N/A 06/07/2019   Procedure: ESOPHAGOGASTRODUODENOSCOPY (EGD) WITH PROPOFOL;  Surgeon: Ladene Artist, MD;  [facility];  Service: Endoscopy;  Laterality: N/A;  . EUS N/A 08/27/2018   Procedure: UPPER  ENDOSCOPIC ULTRASOUND (EUS) RADIAL;  Surgeon: Milus Banister, MD;  Location: [facility];  Service: Endoscopy;  Laterality: N/A;  . EYE SURGERY    . FLEXIBLE SIGMOIDOSCOPY  01/07/2012   Procedure: FLEXIBLE SIGMOIDOSCOPY;  Surgeon: Lafayette Dragon, MD;  Location: [facility];  Service: Endoscopy;  Laterality: N/A;  . HOT HEMOSTASIS N/A 06/07/2019   Procedure: HOT HEMOSTASIS (ARGON PLASMA COAGULATION/BICAP);  Surgeon: Ladene Artist, MD;  Location: [facility];  Service: Endoscopy;  Laterality: N/A;  . IR IVC FILTER PLMT / S&I Burke Keels GUID/MOD SED  06/07/2019  . IR RADIOLOGIST EVAL & MGMT  05/26/2019  . KNEE ARTHROSCOPY  2008   Right  . KNEE ARTHROSCOPY  2008   Left  . POLYPECTOMY    . [place] THORA/PARACENTESIS  10/28/2011  . Varicocele repair  82's    Family History  Problem Relation Age of Onset  . Stroke Mother   . Diabetes Mother   . Lung cancer Brother   . Prostate cancer Brother   . Liver cancer Cousin   . Liver cancer Paternal Uncle   . Colon cancer Neg Hx   . Stomach cancer Neg Hx   . Esophageal cancer Neg Hx   . Colon polyps Neg Hx   . Rectal cancer Neg Hx   . Pancreatic cancer Neg Hx     Social History   Socioeconomic History  . Marital status: Married    Spouse name: Not on file  . Number of children: 1  . Years of education: Not on file  . Highest education level: Not on file  Occupational History  . Occupation: Retired    Comment: mostly from Scientist, research (medical)  Tobacco Use  . Smoking status: Light Tobacco Smoker    Packs/day: 0.50    Types: Cigarettes, E-cigarettes  . Smokeless tobacco: Never Used  Substance and Sexual Activity  . Alcohol use: Not Currently    Alcohol/week: 0.0 standard drinks  . Drug use: Yes    Types: Marijuana    Comment: occasionally, last time couple of weeks ago  . Sexual activity: Never  Other Topics Concern  . Not on file  Social History Narrative   Married- 2nd   1 son (adopted) from previous marriage      Has living will   Wife  is  health care POA---alternate is son   Would accept attempts at resuscitation   No tube feeds if cognitively unaware      Social Determinants of Health   Financial Resource Strain:   . Difficulty of Paying Living Expenses: Not on file  Food Insecurity:   . Worried About [employer] of  Food in the Last Year: Not on file  . Ran Out of Food in the Last Year: Not on file  Transportation Needs:   . Lack of Transportation (Medical): Not on file  . Lack of Transportation (Non-Medical): Not on file  Physical Activity:   . Days of Exercise per Week: Not on file  . Minutes of Exercise per Session: Not on file  Stress:   . Feeling of Stress : Not on file  Social Connections:   . Frequency of Communication with Friends and Family: Not on file  . Frequency of Social Gatherings with Friends and Family: Not on file  . Attends Religious Services: Not on file  . Active Member of Clubs or Organizations: Not on file  . Attends Archivist Meetings: Not on file  . Marital Status: Not on file  Intimate Partner Violence:   . Fear of Current or Ex-Partner: Not on file  . Emotionally Abused: Not on file  . Physically Abused: Not on file  . Sexually Abused: Not on file   Review of Systems Some increased edema--scrotum stable (or slightly better)     Objective:   Physical Exam  Constitutional: No distress.  Neck: No thyromegaly present.  Cardiovascular: Normal rate, regular rhythm and normal heart sounds. Exam reveals no gallop.  No murmur heard. Respiratory: Effort normal and breath sounds normal. No respiratory distress. He has no wheezes.  Genitourinary:    Genitourinary Comments: scrotal swelling is some better   Musculoskeletal:     Comments: Slight edema  Lymphadenopathy:    He has no cervical adenopathy.           Assessment & Plan:

## 2019-07-02 NOTE — Assessment & Plan Note (Signed)
Ascites and edema no worse Continue current diuretics Going back to Dr Carlean Purl in 2 weeks

## 2019-07-02 NOTE — Assessment & Plan Note (Signed)
Improved Will hold off on labs today Clinically better

## 2019-07-05 ENCOUNTER — Telehealth: Payer: Self-pay | Admitting: Adult Health Nurse Practitioner

## 2019-07-05 NOTE — Telephone Encounter (Signed)
Called patient's wife's cell number to schedule Palliative Consult, no answer - left message with reason for call along with my contact information.

## 2019-07-06 ENCOUNTER — Other Ambulatory Visit: Payer: Self-pay | Admitting: Internal Medicine

## 2019-07-14 ENCOUNTER — Telehealth: Payer: Self-pay | Admitting: Adult Health Nurse Practitioner

## 2019-07-14 NOTE — Telephone Encounter (Signed)
error 

## 2019-07-14 NOTE — Telephone Encounter (Signed)
Rec'd call back from patient and after discussing Palliative services with him he was in agreement with this.  I have scheduled a Telephone Consult for 07/29/19 @ 2 PM

## 2019-07-16 ENCOUNTER — Other Ambulatory Visit: Payer: Self-pay

## 2019-07-16 ENCOUNTER — Inpatient Hospital Stay (HOSPITAL_COMMUNITY)
Admission: EM | Admit: 2019-07-16 | Discharge: 2019-08-16 | DRG: 441 | Disposition: E | Payer: BLUE CROSS/BLUE SHIELD | Attending: Internal Medicine | Admitting: Internal Medicine

## 2019-07-16 ENCOUNTER — Emergency Department (HOSPITAL_COMMUNITY): Payer: BLUE CROSS/BLUE SHIELD

## 2019-07-16 ENCOUNTER — Encounter (HOSPITAL_COMMUNITY): Payer: Self-pay | Admitting: Internal Medicine

## 2019-07-16 DIAGNOSIS — R451 Restlessness and agitation: Secondary | ICD-10-CM | POA: Diagnosis present

## 2019-07-16 DIAGNOSIS — F1120 Opioid dependence, uncomplicated: Secondary | ICD-10-CM | POA: Diagnosis present

## 2019-07-16 DIAGNOSIS — N179 Acute kidney failure, unspecified: Secondary | ICD-10-CM | POA: Diagnosis present

## 2019-07-16 DIAGNOSIS — Z79899 Other long term (current) drug therapy: Secondary | ICD-10-CM

## 2019-07-16 DIAGNOSIS — F17210 Nicotine dependence, cigarettes, uncomplicated: Secondary | ICD-10-CM | POA: Diagnosis present

## 2019-07-16 DIAGNOSIS — Z86718 Personal history of other venous thrombosis and embolism: Secondary | ICD-10-CM

## 2019-07-16 DIAGNOSIS — F17290 Nicotine dependence, other tobacco product, uncomplicated: Secondary | ICD-10-CM | POA: Diagnosis present

## 2019-07-16 DIAGNOSIS — I5032 Chronic diastolic (congestive) heart failure: Secondary | ICD-10-CM | POA: Diagnosis present

## 2019-07-16 DIAGNOSIS — Z20822 Contact with and (suspected) exposure to covid-19: Secondary | ICD-10-CM | POA: Diagnosis present

## 2019-07-16 DIAGNOSIS — K7290 Hepatic failure, unspecified without coma: Secondary | ICD-10-CM | POA: Diagnosis not present

## 2019-07-16 DIAGNOSIS — N5089 Other specified disorders of the male genital organs: Secondary | ICD-10-CM | POA: Diagnosis present

## 2019-07-16 DIAGNOSIS — E8770 Fluid overload, unspecified: Secondary | ICD-10-CM | POA: Diagnosis present

## 2019-07-16 DIAGNOSIS — I130 Hypertensive heart and chronic kidney disease with heart failure and stage 1 through stage 4 chronic kidney disease, or unspecified chronic kidney disease: Secondary | ICD-10-CM | POA: Diagnosis present

## 2019-07-16 DIAGNOSIS — N183 Chronic kidney disease, stage 3 unspecified: Secondary | ICD-10-CM | POA: Diagnosis present

## 2019-07-16 DIAGNOSIS — Z801 Family history of malignant neoplasm of trachea, bronchus and lung: Secondary | ICD-10-CM

## 2019-07-16 DIAGNOSIS — G479 Sleep disorder, unspecified: Secondary | ICD-10-CM | POA: Diagnosis present

## 2019-07-16 DIAGNOSIS — F419 Anxiety disorder, unspecified: Secondary | ICD-10-CM | POA: Diagnosis present

## 2019-07-16 DIAGNOSIS — Z888 Allergy status to other drugs, medicaments and biological substances status: Secondary | ICD-10-CM

## 2019-07-16 DIAGNOSIS — G8929 Other chronic pain: Secondary | ICD-10-CM | POA: Diagnosis present

## 2019-07-16 DIAGNOSIS — Z91128 Patient's intentional underdosing of medication regimen for other reason: Secondary | ICD-10-CM

## 2019-07-16 DIAGNOSIS — Z66 Do not resuscitate: Secondary | ICD-10-CM | POA: Diagnosis not present

## 2019-07-16 DIAGNOSIS — D696 Thrombocytopenia, unspecified: Secondary | ICD-10-CM | POA: Diagnosis present

## 2019-07-16 DIAGNOSIS — Z800 Family history of malignant neoplasm of digestive organs: Secondary | ICD-10-CM

## 2019-07-16 DIAGNOSIS — W19XXXA Unspecified fall, initial encounter: Secondary | ICD-10-CM | POA: Diagnosis present

## 2019-07-16 DIAGNOSIS — Z833 Family history of diabetes mellitus: Secondary | ICD-10-CM

## 2019-07-16 DIAGNOSIS — F329 Major depressive disorder, single episode, unspecified: Secondary | ICD-10-CM | POA: Diagnosis present

## 2019-07-16 DIAGNOSIS — Z515 Encounter for palliative care: Secondary | ICD-10-CM | POA: Diagnosis not present

## 2019-07-16 DIAGNOSIS — I10 Essential (primary) hypertension: Secondary | ICD-10-CM | POA: Diagnosis present

## 2019-07-16 DIAGNOSIS — Z961 Presence of intraocular lens: Secondary | ICD-10-CM | POA: Diagnosis present

## 2019-07-16 DIAGNOSIS — I8510 Secondary esophageal varices without bleeding: Secondary | ICD-10-CM | POA: Diagnosis present

## 2019-07-16 DIAGNOSIS — K767 Hepatorenal syndrome: Secondary | ICD-10-CM | POA: Diagnosis present

## 2019-07-16 DIAGNOSIS — I82401 Acute embolism and thrombosis of unspecified deep veins of right lower extremity: Secondary | ICD-10-CM | POA: Diagnosis present

## 2019-07-16 DIAGNOSIS — M549 Dorsalgia, unspecified: Secondary | ICD-10-CM | POA: Diagnosis present

## 2019-07-16 DIAGNOSIS — Z823 Family history of stroke: Secondary | ICD-10-CM

## 2019-07-16 DIAGNOSIS — Z8042 Family history of malignant neoplasm of prostate: Secondary | ICD-10-CM

## 2019-07-16 DIAGNOSIS — Y92009 Unspecified place in unspecified non-institutional (private) residence as the place of occurrence of the external cause: Secondary | ICD-10-CM

## 2019-07-16 DIAGNOSIS — H9190 Unspecified hearing loss, unspecified ear: Secondary | ICD-10-CM | POA: Diagnosis present

## 2019-07-16 DIAGNOSIS — I700 Atherosclerosis of aorta: Secondary | ICD-10-CM | POA: Diagnosis present

## 2019-07-16 DIAGNOSIS — T473X6A Underdosing of saline and osmotic laxatives, initial encounter: Secondary | ICD-10-CM | POA: Diagnosis present

## 2019-07-16 DIAGNOSIS — Z85828 Personal history of other malignant neoplasm of skin: Secondary | ICD-10-CM

## 2019-07-16 DIAGNOSIS — Z9049 Acquired absence of other specified parts of digestive tract: Secondary | ICD-10-CM

## 2019-07-16 DIAGNOSIS — Z9841 Cataract extraction status, right eye: Secondary | ICD-10-CM

## 2019-07-16 DIAGNOSIS — Z9842 Cataract extraction status, left eye: Secondary | ICD-10-CM

## 2019-07-16 DIAGNOSIS — K7031 Alcoholic cirrhosis of liver with ascites: Secondary | ICD-10-CM | POA: Diagnosis present

## 2019-07-16 DIAGNOSIS — S0990XA Unspecified injury of head, initial encounter: Secondary | ICD-10-CM | POA: Diagnosis present

## 2019-07-16 DIAGNOSIS — K7682 Hepatic encephalopathy: Secondary | ICD-10-CM | POA: Diagnosis present

## 2019-07-16 DIAGNOSIS — K219 Gastro-esophageal reflux disease without esophagitis: Secondary | ICD-10-CM | POA: Diagnosis present

## 2019-07-16 DIAGNOSIS — Z8711 Personal history of peptic ulcer disease: Secondary | ICD-10-CM

## 2019-07-16 DIAGNOSIS — K766 Portal hypertension: Secondary | ICD-10-CM | POA: Diagnosis present

## 2019-07-16 LAB — COMPREHENSIVE METABOLIC PANEL
ALT: 19 U/L (ref 0–44)
AST: 38 U/L (ref 15–41)
Albumin: 2.9 g/dL — ABNORMAL LOW (ref 3.5–5.0)
Alkaline Phosphatase: 120 U/L (ref 38–126)
Anion gap: 14 (ref 5–15)
BUN: 35 mg/dL — ABNORMAL HIGH (ref 8–23)
CO2: 25 mmol/L (ref 22–32)
Calcium: 8.8 mg/dL — ABNORMAL LOW (ref 8.9–10.3)
Chloride: 101 mmol/L (ref 98–111)
Creatinine, Ser: 2.09 mg/dL — ABNORMAL HIGH (ref 0.61–1.24)
GFR calc Af Amer: 36 mL/min — ABNORMAL LOW (ref >60)
GFR calc non Af Amer: 31 mL/min — ABNORMAL LOW (ref >60)
Glucose, Bld: 97 mg/dL (ref 70–99)
Potassium: 3.3 mmol/L — ABNORMAL LOW (ref 3.5–5.1)
Sodium: 140 mmol/L (ref 135–145)
Total Bilirubin: 1.5 mg/dL — ABNORMAL HIGH (ref 0.3–1.2)
Total Protein: 6.7 g/dL (ref 6.5–8.1)

## 2019-07-16 LAB — CBC WITH DIFFERENTIAL/PLATELET
Abs Immature Granulocytes: 0.06 10*3/uL (ref 0.00–0.07)
Basophils Absolute: 0.1 10*3/uL (ref 0.0–0.1)
Basophils Relative: 0 %
Eosinophils Absolute: 0.1 10*3/uL (ref 0.0–0.5)
Eosinophils Relative: 0 %
HCT: 31.8 % — ABNORMAL LOW (ref 39.0–52.0)
Hemoglobin: 10.1 g/dL — ABNORMAL LOW (ref 13.0–17.0)
Immature Granulocytes: 1 %
Lymphocytes Relative: 4 %
Lymphs Abs: 0.5 10*3/uL — ABNORMAL LOW (ref 0.7–4.0)
MCH: 29.8 pg (ref 26.0–34.0)
MCHC: 31.8 g/dL (ref 30.0–36.0)
MCV: 93.8 fL (ref 80.0–100.0)
Monocytes Absolute: 1.2 10*3/uL — ABNORMAL HIGH (ref 0.1–1.0)
Monocytes Relative: 9 %
Neutro Abs: 10.5 10*3/uL — ABNORMAL HIGH (ref 1.7–7.7)
Neutrophils Relative %: 86 %
Platelets: 149 10*3/uL — ABNORMAL LOW (ref 150–400)
RBC: 3.39 MIL/uL — ABNORMAL LOW (ref 4.22–5.81)
RDW: 21.2 % — ABNORMAL HIGH (ref 11.5–15.5)
WBC: 12.3 10*3/uL — ABNORMAL HIGH (ref 4.0–10.5)
nRBC: 0 % (ref 0.0–0.2)

## 2019-07-16 LAB — BRAIN NATRIURETIC PEPTIDE: B Natriuretic Peptide: 89.1 pg/mL (ref 0.0–100.0)

## 2019-07-16 LAB — SARS CORONAVIRUS 2 (TAT 6-24 HRS): SARS Coronavirus 2: NEGATIVE

## 2019-07-16 LAB — AMMONIA: Ammonia: 81 umol/L — ABNORMAL HIGH (ref 9–35)

## 2019-07-16 MED ORDER — ONDANSETRON HCL 4 MG/2ML IJ SOLN: 4.0000 mg | Freq: Four times a day (QID) | INTRAMUSCULAR | Status: DC | PRN

## 2019-07-16 MED ORDER — BUPROPION HCL ER (SR) 150 MG PO TB12
150.0000 mg | ORAL_TABLET | Freq: Every day | ORAL | Status: DC
  Filled 2019-07-16: qty 1

## 2019-07-16 MED ORDER — SODIUM BICARBONATE 650 MG PO TABS
650.0000 mg | ORAL_TABLET | Freq: Three times a day (TID) | ORAL | Status: DC
  Administered 2019-07-16: 650 mg via ORAL
  Filled 2019-07-16: qty 1

## 2019-07-16 MED ORDER — SODIUM CHLORIDE 0.9% FLUSH
3.0000 mL | Freq: Two times a day (BID) | INTRAVENOUS | Status: DC
  Administered 2019-07-16 (×2): 3 mL via INTRAVENOUS

## 2019-07-16 MED ORDER — ACETAMINOPHEN 650 MG RE SUPP: 650.0000 mg | Freq: Four times a day (QID) | RECTAL | Status: DC | PRN

## 2019-07-16 MED ORDER — LORAZEPAM 0.5 MG PO TABS: 0.2500 mg | ORAL_TABLET | Freq: Two times a day (BID) | ORAL | Status: DC | PRN

## 2019-07-16 MED ORDER — LACTULOSE 10 GM/15ML PO SOLN
10.0000 g | Freq: Two times a day (BID) | ORAL | Status: DC
  Administered 2019-07-16: 10 g via ORAL
  Filled 2019-07-16: qty 15

## 2019-07-16 MED ORDER — ONDANSETRON HCL 4 MG PO TABS: 4.0000 mg | ORAL_TABLET | Freq: Four times a day (QID) | ORAL | Status: DC | PRN

## 2019-07-16 MED ORDER — ADULT MULTIVITAMIN W/MINERALS CH: 1.0000 | ORAL_TABLET | Freq: Every day | ORAL | Status: DC

## 2019-07-16 MED ORDER — LACTULOSE 10 GM/15ML PO SOLN: 5.0000 g | Freq: Once | ORAL | Status: DC

## 2019-07-16 MED ORDER — HYDROCODONE-ACETAMINOPHEN 5-325 MG PO TABS
1.0000 | ORAL_TABLET | Freq: Three times a day (TID) | ORAL | Status: DC | PRN
  Administered 2019-07-16 (×2): 1 via ORAL
  Filled 2019-07-16 (×2): qty 1

## 2019-07-16 MED ORDER — FERROUS SULFATE 325 (65 FE) MG PO TABS: 325.0000 mg | ORAL_TABLET | ORAL | Status: DC

## 2019-07-16 MED ORDER — ASCORBIC ACID 500 MG PO TABS: 500.0000 mg | ORAL_TABLET | Freq: Every day | ORAL | Status: DC

## 2019-07-16 MED ORDER — LACTULOSE 10 GM/15ML PO SOLN
10.0000 g | Freq: Once | ORAL | Status: AC
  Administered 2019-07-16: 10 g via ORAL
  Filled 2019-07-16: qty 15

## 2019-07-16 MED ORDER — FUROSEMIDE 40 MG PO TABS
40.0000 mg | ORAL_TABLET | Freq: Two times a day (BID) | ORAL | Status: DC
  Administered 2019-07-16: 40 mg via ORAL
  Filled 2019-07-16: qty 1

## 2019-07-16 MED ORDER — ACETAMINOPHEN 325 MG PO TABS: 650.0000 mg | ORAL_TABLET | Freq: Four times a day (QID) | ORAL | Status: DC | PRN

## 2019-07-16 MED ORDER — PANTOPRAZOLE SODIUM 40 MG PO TBEC
40.0000 mg | DELAYED_RELEASE_TABLET | Freq: Two times a day (BID) | ORAL | Status: DC
  Administered 2019-07-16: 40 mg via ORAL
  Filled 2019-07-16: qty 1

## 2019-07-16 MED ORDER — MIDODRINE HCL 5 MG PO TABS
7.5000 mg | ORAL_TABLET | Freq: Three times a day (TID) | ORAL | Status: DC
  Administered 2019-07-16: 7.5 mg via ORAL
  Filled 2019-07-16 (×3): qty 1

## 2019-07-16 MED ORDER — MIRTAZAPINE 15 MG PO TABS
45.0000 mg | ORAL_TABLET | Freq: Every day | ORAL | Status: DC
  Administered 2019-07-16: 45 mg via ORAL
  Filled 2019-07-16: qty 3
  Filled 2019-07-16: qty 1

## 2019-07-16 NOTE — Progress Notes (Signed)
New Admission Note:   Arrival Method: Bed  Mental Orientation: alert and oriented  Telemetry: box 4  Assessment: Completed Skin: bruising on hip and back from fall  IV: Left AC and Right hand Saline Lock  Pain: 0/10  Tubes: none  Safety Measures: Safety Fall Prevention Plan has been given, discussed and signed Admission: Completed [facility] Orientation: Patient has been orientated to the room, unit and staff.  Family: none   Orders have been reviewed and implemented. Will continue to monitor the patient. Call light has been placed within reach and bed alarm has been activated.   Anira Senegal RN [facility] Phone: [phone]

## 2019-07-16 NOTE — ED Notes (Signed)
Dinner tray ordered.

## 2019-07-16 NOTE — ED Triage Notes (Signed)
Pt here from home via EMS. Hx liver cirrhosis-wife reports increased swelling in abdomen and AMS x 24 hours. Pt has had two falls, last one yesterday, d/t confusion. Hematoma to back of head. Pt taking Eliquis. No N/V/D. Denies pain. Pupils unequal at baseline. Pt A/O x2, unsure of holiday/time but oriented to person and situation.

## 2019-07-16 NOTE — ED Notes (Signed)
External cath. Placed due to pt being incontinent.
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
## 2019-07-16 NOTE — H&P (Addendum)
History and Physical    [name] [id number] DOB: 03/09/1946 DOA: 07/21/2019  PCP: [name], MD Consultants:  Carlean Purl - GI Patient coming from:  Home - lives with wife; NOK: Wife, [phone]; [phone]  Chief Complaint: AMS  HPI: Billy [name] is a 74 y.o. 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 He was previously hospitalized from 12/3-9 for decompensated cirrhosis with ascites and esophageal varices.  He was started on Lasix 80 mg BID and underwent paracentesis.  Given his overall poor prognosis, palliative care was consulted and the patient was referred for outpatient palliative care and has a telephone consult scheduled for 1/14.  He reports being in the hospital 3 times recently.  He has fallen and hit his nose.  He is trying to provide a history but is clearly confused.  He acknowledges kidney problems.  He has not been taking his lactulose because "uh, it's a very po, uh, what's that word I'm looking for... I'm just not normal right now.  I was not this way before when I was at the other hospital... So yeah, they said I had stage 3 dangie... I don't know, I don't know what I'm gonna do."  His wife reports that even 1 tablespoon causing violent diarrhea with incontinence.  He is wearing Depends, his scrotum is huge and so he doesn't get good coverage.  He leaks fluid from the skin of his scrotum when he is up and around.  She is 5'1" and unable to pick him up, very frustrating. He gets more stubborn with encephalopathy.  She has not discussed placement considerations and is hoping Palliative care can offer recommendations in this regards.   ED Course: Hepatic encephalopathy.  Recently hospitalized, taken off diuretics.  Generally weak and stable.   Yesterday a bit confused and cleared until the evening.  Fall yesterday, increasing confusion.  Called Palliative care today (has not started yet) and they recommended calling 911.  Has had edema, scrotal leakage, wearing a pad.  Explosive diarrhea from Lactulose so stopped taking.  Asterixis but awake, NH4 81.  Appears volume overloaded, may need low-dose diuretic.  Review of Systems: As per HPI; otherwise review of systems reviewed and negative.   Ambulatory Status:  Ambulates without assistance or with a walker at times  Past Medical History:  Diagnosis Date  . Acute deep vein thrombosis (DVT) of femoral vein of right lower extremity ([place]) 03/30/2019  . Acute posthemorrhagic anemia   . Alcoholic cirrhosis of liver ([place]) 09/24/2011   Manifestations are portal hypertension with esophageal varices, portal gastropathy, rectal varices and he may have portal colopathy. Ascites also present. Naive to HAV and HBV Claims he has had pneumococcal vaccine   . Angiodysplasia of colon 09/11/2011   Multiple in the right colon, ablated with APC. Question if this is portal colopathy.   . Anxiety   . Back pain   . Basal cell carcinoma   . DDD (degenerative disc disease), lumbar   . Depression    mild at most  . Diverticulosis of colon   . Erectile dysfunction   . Gastric ulcer 08/2011   small, antral ulcer  . GERD (gastroesophageal reflux disease)    takes Omeprazole prn  . GI hemorrhage 2006, 2013   esophagitis and 1+ varices, melena in 2013   . Grade I diastolic dysfunction   .  Hearing loss   . Hepatorenal syndrome (HCC)-Type 2 06/14/2019  . Hydrocele   . Hypertension    takes Nadolol daily  . Narcotic dependence (HCC)    chronic back pain  . Portal hypertension ([place]) 08/2011  . 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 Sleep disturbance    takes Remeron nightly  . Thrombocytopenia, unspecified ([place])   . Varices, esophageal ([place]) 08/2011   Grade 2, mid-esophagus    Past Surgical History:  Procedure  Laterality Date  . BIOPSY  08/27/2018   Procedure: BIOPSY;  Surgeon: Milus Banister, MD;  Location: [facility];  Service: Endoscopy;;  . CATARACT EXTRACTION  2013   bilateral with IOL  . CHOLECYSTECTOMY  12/31/2012   Procedure: LAPAROSCOPIC CHOLECYSTECTOMY;  Surgeon: Zenovia Jarred, MD;  Location: [facility];  Service: General;;  . COLONOSCOPY  2001, 200411/02/2006  . COLONOSCOPY  09/11/2011   Procedure: COLONOSCOPY;  Surgeon: Gatha Mayer, MD;  [facility];  Service: Endoscopy;  Laterality: N/A;  . COLONOSCOPY    . COLONOSCOPY WITH PROPOFOL N/A 06/07/2019   Procedure: COLONOSCOPY WITH PROPOFOL;  Surgeon: Ladene Artist, MD;  [facility];  Service: Endoscopy;  Laterality: N/A;  . ESOPHAGEAL BANDING N/A 05/02/2017   Procedure: ESOPHAGEAL BANDING;  Surgeon: Gatha Mayer, MD;  [facility];  Service: Endoscopy;  Laterality: N/A;  . ESOPHAGEAL BANDING  06/09/2018   Procedure: ESOPHAGEAL BANDING;  Surgeon: Gatha Mayer, MD;  [facility];  Service: Endoscopy;;  . ESOPHAGEAL BANDING  08/27/2018   Procedure: ESOPHAGEAL BANDING;  Surgeon: Milus Banister, MD;  [facility];  Service: Endoscopy;;  . ESOPHAGOGASTRODUODENOSCOPY  2006  . ESOPHAGOGASTRODUODENOSCOPY  09/11/2011   Procedure: ESOPHAGOGASTRODUODENOSCOPY (EGD);  Surgeon: Gatha Mayer, MD;  [facility];  Service: Endoscopy;  Laterality: N/A;  . ESOPHAGOGASTRODUODENOSCOPY N/A 08/27/2018   Procedure: ESOPHAGOGASTRODUODENOSCOPY (EGD);  Surgeon: Milus Banister, MD;  [facility];  Service: Endoscopy;  Laterality: N/A;  . ESOPHAGOGASTRODUODENOSCOPY (EGD) WITH PROPOFOL N/A 04/13/2017   Procedure: ESOPHAGOGASTRODUODENOSCOPY (EGD) WITH PROPOFOL;  Surgeon: Doran Stabler, MD;  [facility];  Service: Gastroenterology;  Laterality: N/A;  . ESOPHAGOGASTRODUODENOSCOPY (EGD) WITH PROPOFOL N/A 05/02/2017   Procedure: ESOPHAGOGASTRODUODENOSCOPY (EGD) WITH  PROPOFOL;  Surgeon: Gatha Mayer, MD;  [facility];  Service: Endoscopy;  Laterality: N/A;  . ESOPHAGOGASTRODUODENOSCOPY (EGD) WITH PROPOFOL N/A 09/30/2017   Procedure: ESOPHAGOGASTRODUODENOSCOPY (EGD) WITH PROPOFOL;  Surgeon: Gatha Mayer, MD;  [facility];  Service: Endoscopy;  Laterality: N/A;  . ESOPHAGOGASTRODUODENOSCOPY (EGD) WITH PROPOFOL N/A 06/09/2018   Procedure: ESOPHAGOGASTRODUODENOSCOPY (EGD) WITH PROPOFOL;  Surgeon: Gatha Mayer, MD;  [facility];  Service: Endoscopy;  Laterality: N/A;  . ESOPHAGOGASTRODUODENOSCOPY (EGD) WITH PROPOFOL N/A 01/05/2019   Procedure: ESOPHAGOGASTRODUODENOSCOPY (EGD) WITH PROPOFOL;  Surgeon: Gatha Mayer, MD;  [facility];  Service: Endoscopy;  Laterality: N/A;  . ESOPHAGOGASTRODUODENOSCOPY (EGD) WITH PROPOFOL N/A 05/30/2019   Procedure: ESOPHAGOGASTRODUODENOSCOPY (EGD) WITH PROPOFOL;  Surgeon: Carol Ada, MD;  [facility];  Service: Endoscopy;  Laterality: N/A;  . ESOPHAGOGASTRODUODENOSCOPY (EGD) WITH PROPOFOL N/A 06/07/2019   Procedure: ESOPHAGOGASTRODUODENOSCOPY (EGD) WITH PROPOFOL;  Surgeon: Ladene Artist, MD;  [facility];  Service: Endoscopy;  Laterality: N/A;  . EUS N/A 08/27/2018   Procedure: UPPER ENDOSCOPIC ULTRASOUND (EUS) RADIAL;  Surgeon: Milus Banister, MD;  [facility];  Service: Endoscopy;  Laterality: N/A;  . EYE SURGERY    . FLEXIBLE SIGMOIDOSCOPY  01/07/2012   Procedure: FLEXIBLE SIGMOIDOSCOPY;  Surgeon: Lafayette Dragon, MD;  Location: [facility];  Service: Endoscopy;  Laterality: N/A;  . HOT HEMOSTASIS N/A 06/07/2019   Procedure: HOT HEMOSTASIS (ARGON PLASMA COAGULATION/BICAP);  Surgeon: Ladene Artist, MD;  Location[facility];  Service: Endoscopy;  Laterality: N/A;  . IR IVC FILTER PLMT / S&I Burke Keels GUID/MOD SED  06/07/2019  . IR RADIOLOGIST EVAL & MGMT  05/26/2019  . KNEE ARTHROSCOPY  2008   Right  . KNEE ARTHROSCOPY  2008   Left  .  POLYPECTOMY    . [place] THORA/PARACENTESIS  10/28/2011  . Varicocele repair  1980's    Social History   Socioeconomic History  . Marital status: Married    Spouse name: Not on file  . Number of children: 1  . Years of education: Not on file  . Highest education level: Not on file  Occupational History  . Occupation: Retired    Comment: mostly from Scientist, research (medical)  Tobacco Use  . Smoking status: Current Every Day Smoker    Packs/day: 0.50    Types: Cigarettes, E-cigarettes  . Smokeless tobacco: Never Used  Substance and Sexual Activity  . Alcohol use: Not Currently    Alcohol/week: 0.0 standard drinks    Comment: not in years  . Drug use: Yes    Types: Marijuana    Comment: daily  . Sexual activity: Never  Other Topics Concern  . Not on file  Social History Narrative   Married- 2nd   1 son (adopted) from previous marriage      Has living will   Wife is  health care POA---alternate is son   Would accept attempts at resuscitation   No tube feeds if cognitively unaware      Social Determinants of Health   Financial Resource Strain:   . Difficulty of Paying Living Expenses: Not on file  Food Insecurity:   . Worried About Charity fundraiser in the Last Year: Not on file  . Ran Out of Food in the Last Year: Not on file  Transportation Needs:   . Lack of Transportation (Medical): Not on file  . Lack of Transportation (Non-Medical): Not on file  Physical Activity:   . Days of Exercise per Week: Not on file  . Minutes of Exercise per Session: Not on file  Stress:   . Feeling of Stress : Not on file  Social Connections:   . Frequency of Communication with Friends and Family: Not on file  . Frequency of Social Gatherings with Friends and Family: Not on file  . Attends Religious Services: Not on file  . Active Member of Clubs or Organizations: Not on file  . Attends Archivist Meetings: Not on file  . Marital Status: Not on file  Intimate Partner Violence:   . Fear of  Current or Ex-Partner: Not on file  . Emotionally Abused: Not on file  . Physically Abused: Not on file  . Sexually Abused: Not on file    Allergies  Allergen Reactions  . Yellow Jacket Venom Anaphylaxis, Shortness Of Breath and Other (See Comments)    Throat closes up/eye swell shut  . Eliquis [Apixaban] Other (See Comments)    Causes bleeding  . Nsaids Other (See Comments)    Stomach pain, Bleeding risk    Family History  Problem Relation Age of Onset  . Stroke Mother   . Diabetes Mother   . Lung cancer Brother   . Prostate cancer Brother   . Liver cancer Cousin   .  Liver cancer Paternal Uncle   . Colon cancer Neg Hx   . Stomach cancer Neg Hx   . Esophageal cancer Neg Hx   . Colon polyps Neg Hx   . Rectal cancer Neg Hx   . Pancreatic cancer Neg Hx     Prior to Admission medications   Medication Sig Start Date End Date Taking? Authorizing Provider  buPROPion (WELLBUTRIN SR) 150 MG 12 hr tablet TAKE 1 TABLET BY MOUTH EVERY DAY 06/01/19   Viviana Simpler I, MD  ferrous sulfate 325 (65 FE) MG EC tablet Take 325 mg by mouth every other day.    [provider]  fluocinonide cream (LIDEX) 0.05 % APPLY TO AFFECTED AREA TWICE A DAY AS NEEDED FOR SKIN RASH/IRRITATION 07/07/19   [name], MD  furosemide (LASIX) 40 MG tablet Take 1 tablet (40 mg total) by mouth 2 (two) times daily. 06/23/19 07/23/19  Blain Pais, MD  HYDROcodone-acetaminophen (NORCO/VICODIN) 5-325 MG tablet Take 1 tablet by mouth 3 (three) times daily as needed (for pain.). 06/25/19   [name], MD  lactulose (CHRONULAC) 10 GM/15ML solution Take 30 mLs (20 g total) by mouth daily. Please keep 3-4 bowel movement daily, please call your GI doctor if your bm less than three times a day or more than 4 times a day. Your GI doctor will help you adjust lactulose dose. 06/25/19   [name], MD  LORazepam (ATIVAN) 0.5 MG tablet Take 0.5-1 tablets (0.25-0.5 mg total) by mouth 2 (two) times  daily as needed for anxiety. 06/25/19   [name], MD  midodrine (PROAMATINE) 2.5 MG tablet Take 3 tablets (7.5 mg total) by mouth 3 (three) times daily with meals. 07/02/19   [name], MD  mirtazapine (REMERON) 45 MG tablet TAKE 1 TABLET (45 MG TOTAL) BY MOUTH AT BEDTIME. 05/12/19   [name], MD  Multiple Vitamin (MULTIVITAMIN WITH MINERALS) TABS Take 1 tablet by mouth daily.     [provider]  pantoprazole (PROTONIX) 40 MG tablet Take 1 tablet (40 mg total) by mouth 2 (two) times daily before a meal. 06/09/19 07/09/19  Florencia Reasons, MD  silver sulfADIAZINE (SILVADENE) 1 % cream Apply topically 2 (two) times daily. To left thigh 06/09/19   Florencia Reasons, MD  sodium bicarbonate 650 MG tablet Take 1 tablet (650 mg total) by mouth 3 (three) times daily. 06/23/19   Blain Pais, MD  vitamin C (ASCORBIC ACID) 250 MG tablet Take 250 mg by mouth daily.     [provider]    Physical Exam: Vitals:   07/27/2019 1300 08/12/2019 1330 07/19/2019 1400 07/30/2019 1439  BP: (!) 142/87 134/83 137/85 137/85  Pulse:    (!) 110  Resp: (!) 27 (!) 23 (!) 24 (!) 24  Temp:      TempSrc:      SpO2:    96%  Weight:      Height:         . General:  Appears calm and comfortable and is NAD, confused - appears to know what he wants to say but gets confused trying to say it . Eyes:  Anisocoria with chronically blown L pupil (per patient), EOMI, normal lids, iris . ENT:  grossly normal hearing, lips & tongue, mmm; appropriate dentition . Neck:  no LAD, masses or thyromegaly . Cardiovascular:  RR with mild tachycardia, no m/r/g.  . Respiratory:   CTA bilaterally with no wheezes/rales/rhonchi.  Mildly increased respiratory effort. Marland Kitchen  Abdomen:  Marked taut ascites that patient reports is stable, unchanged, and not causing SOB at this time . Back:   normal alignment, no CVAT . Skin:  no rash or induration seen on limited exam . Musculoskeletal:  grossly normal tone BUE/BLE, good  ROM, no bony abnormality . Psychiatric:  grossly normal mood and affect, speech fluent but clearly confused and patient with difficulty clearly expressing himself . Neurologic:  CN 2-12 grossly intact, moves all extremities in coordinated fashion    Radiological Exams on Admission: CT Head Wo Contrast  Result Date: 07/29/2019 CLINICAL DATA:  Altered mental status. Falls. Hematoma to back of head. On blood thinners. EXAM: CT HEAD WITHOUT CONTRAST TECHNIQUE: Contiguous axial images were obtained from the base of the skull through the vertex without intravenous contrast. COMPARISON:  05/28/2019 FINDINGS: Brain: Mild low density in the periventricular white matter likely related to small vessel disease. Mildly age advanced cerebral atrophy. Right temporal lobe hypoattenuation on 14/3 is similar to on the prior exam, favored to be related to remote ischemia. No mass lesion, hemorrhage, hydrocephalus, acute infarct, intra-axial, or extra-axial fluid collection. Vascular: No hyperdense vessel or unexpected calcification. Skull: Right parietooccipital scalp soft tissue swelling, including on 77/4. This is relatively mild. Hyperostosis frontalis interna. No skull fracture. Sinuses/Orbits: Normal imaged portions of the orbits and globes. Clear paranasal sinuses and mastoid air cells. Other: None. IMPRESSION: 1.  No acute intracranial abnormality. 2.  Cerebral atrophy and small vessel ischemic change. Electronically Signed   By: Abigail Miyamoto M.D.   On: 07/19/2019 11:53   DG Chest Port 1 View  Result Date: 07/20/2019 CLINICAL DATA:  Confusion. EXAM: PORTABLE CHEST 1 VIEW COMPARISON:  Chest radiograph 05/28/2019 FINDINGS: Heart size within normal limits.  Aortic atherosclerosis. There is no airspace consolidation within the lungs. No evidence of pleural effusion or pneumothorax. No displaced fracture is identified. Overlying cardiac monitoring leads. IMPRESSION: No evidence of acute cardiopulmonary abnormality. Aortic  atherosclerosis. Electronically Signed   By: Kellie Simmering DO   On: 07/27/2019 10:13    EKG: Independently reviewed.  NSR with rate 96; nonspecific ST changes with no evidence of acute ischemia; NSCSLT   Labs on Admission: I have personally reviewed the available labs and imaging studies at the time of the admission.  Pertinent labs:   K+ 3.3 BUN 35/Creatinine 2.09/GFR 31 - stable Albumin 2.9 NH4 81 Bili 1.5 BNP 89.1 WBC 12.3 Hgb 10.1 Platelets 149 COVID pending  Assessment/Plan Principal Problem:   Hepatic encephalopathy (HCC) Active Problems:   Essential hypertension, benign   Narcotic dependence (HCC)   Right leg DVT (HCC)   Chronic renal disease, stage III   Hepatorenal syndrome (HCC)-Type 2   Hepatic encephalopathy -Patient with known alcoholic cirrhosis which has been decompensated with ascites and varices as well as hepatorenal syndrome; he was recently hospitalized for this issue -Presenting with acute onset of AMS yesterday and today -He is not compliant with lactulose due to explosive diarrhea which is complicated by marked scrotal edema -Ammonia 81; 37 in 05/2019 -He does appear to be encephalopathic with waxing and waning mental status -No apparent s/sx of infection  -He does not appear to have new electrolyte abnormalities, worsening renal failure, hypoglycemia, or other apparent triggers - although he has baseline hepatorenal syndrome -He really has to take the lactulose in order to have clear mentation -Will try 10 mg BID for now and titrate up/down based on mentation and diarrhea -will add scrotal support  -MELD/MELD-Na score is 18, with  a mortality rate of 6% -Continue diuretics, but gently (see below) -He does not appear to be on Rifaxin  Hepatorenal syndrome -Patient with known advanced alcoholic liver disease, recurrent hospitalizations -He also has stable chronic CKD thought to be due to hepatorenal syndrome -During his last hospitalization he was  diuresed and also treated with albumin, midodrine and octreotide; he was discharged with midodrine -It is not clear if he was taking diuretics at home; will resume Lasix and continue to follow -During his last hospitalization he had paracentesis; he reports stability in this regard but has mild tachypnea - if not improving, consideration repeat paracentesis -Will need PT/OT consultations, possibly would benefit from placement -Will reconsult palliative care for ongoing discussions - his wife seems inclined to proceed with hospice referral and requests more information -Continue bicarb  Narcotic dependence -Continue Norco for pain  Recent R leg DVT -No AC due to GI bleeding history  Note: This patient has been tested and is pending for the novel coronavirus COVID-19.   DVT prophylaxis: SCDs Code Status: DNR, as per recent admission; patient changed his mind and requests to be full code and so order was changed Family Communication: None present; I spoke with his wife by telephone Disposition Plan: To be determined Consults called: Palliative care Admission status: Admit - It is my clinical opinion that admission to INPATIENT is reasonable and necessary because of the expectation that this patient will require hospital care that crosses at least 2 midnights to treat this condition based on the medical complexity of the problems presented.  Given the aforementioned information, the predictability of an adverse outcome is felt to be significant.     Karmen Bongo MD [facility]   How to contact the [facility] Attending or Consulting provider [place] or covering provider during after hours [place], for this patient?  1. Check the care team in [facility] and look for a) attending/consulting TR[facility] provider listed and b) the [facility] team listed 2. Log into [email or web address] and use [place]'s universal password to access. If you do not have the password, please contact the hospital operator. 3. Locate the  [facility] provider you are looking for under [facility] and page to a number that you can be directly reached. 4. If you still have difficulty reaching the provider, please page the [facility] (Director on Call) for the Hospitalists listed on amion for assistance.   07/23/2019, 3:37 PM

## 2019-07-16 NOTE — Plan of Care (Signed)
  Problem: Activity: Goal: Risk for activity intolerance will decrease Outcome: Progressing   

## 2019-07-17 DIAGNOSIS — F1120 Opioid dependence, uncomplicated: Secondary | ICD-10-CM | POA: Diagnosis present

## 2019-07-17 DIAGNOSIS — I8510 Secondary esophageal varices without bleeding: Secondary | ICD-10-CM | POA: Diagnosis present

## 2019-07-17 DIAGNOSIS — K767 Hepatorenal syndrome: Secondary | ICD-10-CM | POA: Diagnosis present

## 2019-07-17 DIAGNOSIS — K7031 Alcoholic cirrhosis of liver with ascites: Secondary | ICD-10-CM | POA: Diagnosis present

## 2019-07-17 DIAGNOSIS — Z8711 Personal history of peptic ulcer disease: Secondary | ICD-10-CM | POA: Diagnosis not present

## 2019-07-17 DIAGNOSIS — I130 Hypertensive heart and chronic kidney disease with heart failure and stage 1 through stage 4 chronic kidney disease, or unspecified chronic kidney disease: Secondary | ICD-10-CM | POA: Diagnosis present

## 2019-07-17 DIAGNOSIS — Z86718 Personal history of other venous thrombosis and embolism: Secondary | ICD-10-CM | POA: Diagnosis not present

## 2019-07-17 DIAGNOSIS — W19XXXA Unspecified fall, initial encounter: Secondary | ICD-10-CM | POA: Diagnosis present

## 2019-07-17 DIAGNOSIS — I5032 Chronic diastolic (congestive) heart failure: Secondary | ICD-10-CM | POA: Diagnosis present

## 2019-07-17 DIAGNOSIS — N179 Acute kidney failure, unspecified: Secondary | ICD-10-CM | POA: Diagnosis present

## 2019-07-17 DIAGNOSIS — N183 Chronic kidney disease, stage 3 unspecified: Secondary | ICD-10-CM | POA: Diagnosis present

## 2019-07-17 DIAGNOSIS — H9190 Unspecified hearing loss, unspecified ear: Secondary | ICD-10-CM | POA: Diagnosis present

## 2019-07-17 DIAGNOSIS — Y92009 Unspecified place in unspecified non-institutional (private) residence as the place of occurrence of the external cause: Secondary | ICD-10-CM | POA: Diagnosis not present

## 2019-07-17 DIAGNOSIS — Z515 Encounter for palliative care: Secondary | ICD-10-CM | POA: Diagnosis not present

## 2019-07-17 DIAGNOSIS — F329 Major depressive disorder, single episode, unspecified: Secondary | ICD-10-CM | POA: Diagnosis present

## 2019-07-17 DIAGNOSIS — I82401 Acute embolism and thrombosis of unspecified deep veins of right lower extremity: Secondary | ICD-10-CM | POA: Diagnosis present

## 2019-07-17 DIAGNOSIS — K219 Gastro-esophageal reflux disease without esophagitis: Secondary | ICD-10-CM | POA: Diagnosis present

## 2019-07-17 DIAGNOSIS — G8929 Other chronic pain: Secondary | ICD-10-CM | POA: Diagnosis present

## 2019-07-17 DIAGNOSIS — E8770 Fluid overload, unspecified: Secondary | ICD-10-CM | POA: Diagnosis present

## 2019-07-17 DIAGNOSIS — R451 Restlessness and agitation: Secondary | ICD-10-CM

## 2019-07-17 DIAGNOSIS — Z66 Do not resuscitate: Secondary | ICD-10-CM | POA: Diagnosis not present

## 2019-07-17 DIAGNOSIS — K7290 Hepatic failure, unspecified without coma: Principal | ICD-10-CM

## 2019-07-17 DIAGNOSIS — Z85828 Personal history of other malignant neoplasm of skin: Secondary | ICD-10-CM | POA: Diagnosis not present

## 2019-07-17 DIAGNOSIS — M549 Dorsalgia, unspecified: Secondary | ICD-10-CM | POA: Diagnosis present

## 2019-07-17 DIAGNOSIS — F419 Anxiety disorder, unspecified: Secondary | ICD-10-CM | POA: Diagnosis present

## 2019-07-17 DIAGNOSIS — Z20822 Contact with and (suspected) exposure to covid-19: Secondary | ICD-10-CM | POA: Diagnosis present

## 2019-07-17 DIAGNOSIS — K766 Portal hypertension: Secondary | ICD-10-CM | POA: Diagnosis present

## 2019-07-17 LAB — AMMONIA: Ammonia: 235 umol/L — ABNORMAL HIGH (ref 9–35)

## 2019-07-17 LAB — CBC
HCT: 30 % — ABNORMAL LOW (ref 39.0–52.0)
Hemoglobin: 9.8 g/dL — ABNORMAL LOW (ref 13.0–17.0)
MCH: 29.5 pg (ref 26.0–34.0)
MCHC: 32.7 g/dL (ref 30.0–36.0)
MCV: 90.4 fL (ref 80.0–100.0)
Platelets: 150 10*3/uL (ref 150–400)
RBC: 3.32 MIL/uL — ABNORMAL LOW (ref 4.22–5.81)
RDW: 21.5 % — ABNORMAL HIGH (ref 11.5–15.5)
WBC: 19.5 10*3/uL — ABNORMAL HIGH (ref 4.0–10.5)
nRBC: 0 % (ref 0.0–0.2)

## 2019-07-17 LAB — COMPREHENSIVE METABOLIC PANEL
ALT: 20 U/L (ref 0–44)
AST: 35 U/L (ref 15–41)
Albumin: 2.8 g/dL — ABNORMAL LOW (ref 3.5–5.0)
Alkaline Phosphatase: 123 U/L (ref 38–126)
Anion gap: 15 (ref 5–15)
BUN: 34 mg/dL — ABNORMAL HIGH (ref 8–23)
CO2: 25 mmol/L (ref 22–32)
Calcium: 8.8 mg/dL — ABNORMAL LOW (ref 8.9–10.3)
Chloride: 99 mmol/L (ref 98–111)
Creatinine, Ser: 2.06 mg/dL — ABNORMAL HIGH (ref 0.61–1.24)
GFR calc Af Amer: 36 mL/min — ABNORMAL LOW (ref >60)
GFR calc non Af Amer: 31 mL/min — ABNORMAL LOW (ref >60)
Glucose, Bld: 124 mg/dL — ABNORMAL HIGH (ref 70–99)
Potassium: 3 mmol/L — ABNORMAL LOW (ref 3.5–5.1)
Sodium: 139 mmol/L (ref 135–145)
Total Bilirubin: 1.8 mg/dL — ABNORMAL HIGH (ref 0.3–1.2)
Total Protein: 6.5 g/dL (ref 6.5–8.1)

## 2019-07-17 LAB — MAGNESIUM: Magnesium: 1.6 mg/dL — ABNORMAL LOW (ref 1.7–2.4)

## 2019-07-17 LAB — GLUCOSE, CAPILLARY: Glucose-Capillary: 113 mg/dL — ABNORMAL HIGH (ref 70–99)

## 2019-07-17 MED ORDER — LORAZEPAM 1 MG PO TABS: 1.0000 mg | ORAL_TABLET | ORAL | Status: DC | PRN

## 2019-07-17 MED ORDER — LORAZEPAM 2 MG/ML IJ SOLN: 1.0000 mg | INTRAMUSCULAR | Status: DC | PRN

## 2019-07-17 MED ORDER — LACTULOSE ENEMA
300.0000 mL | Freq: Once | ORAL | Status: DC
  Filled 2019-07-17: qty 300

## 2019-07-17 MED ORDER — HALOPERIDOL LACTATE 5 MG/ML IJ SOLN: 2.0000 mg | Freq: Four times a day (QID) | INTRAMUSCULAR | Status: DC | PRN

## 2019-07-17 MED ORDER — LORAZEPAM 2 MG/ML PO CONC
1.0000 mg | ORAL | Status: DC | PRN
  Administered 2019-07-17: 1 mg via SUBLINGUAL
  Filled 2019-07-17: qty 1

## 2019-07-17 MED ORDER — MORPHINE SULFATE (CONCENTRATE) 10 MG/0.5ML PO SOLN: 5.0000 mg | ORAL | Status: DC | PRN

## 2019-07-17 MED ORDER — MORPHINE SULFATE (PF) 2 MG/ML IV SOLN
2.0000 mg | Freq: Once | INTRAVENOUS | Status: DC
  Filled 2019-07-17: qty 1

## 2019-07-17 MED ORDER — HALOPERIDOL LACTATE 5 MG/ML IJ SOLN
2.0000 mg | Freq: Once | INTRAMUSCULAR | Status: AC
  Administered 2019-07-17: 2 mg via INTRAVENOUS
  Filled 2019-07-17: qty 1

## 2019-07-17 MED ORDER — MORPHINE SULFATE (PF) 2 MG/ML IV SOLN
1.0000 mg | INTRAVENOUS | Status: DC | PRN
  Administered 2019-07-17: 1 mg via INTRAVENOUS
  Filled 2019-07-17: qty 1

## 2019-07-17 MED ORDER — SODIUM CHLORIDE 0.9 % IV SOLN
2.0000 g | INTRAVENOUS | Status: DC
  Filled 2019-07-17: qty 20

## 2019-07-17 MED ORDER — FUROSEMIDE 10 MG/ML IJ SOLN: 40.0000 mg | Freq: Once | INTRAMUSCULAR | Status: DC

## 2019-07-17 MED ORDER — GLYCOPYRROLATE 0.2 MG/ML IJ SOLN: 0.2000 mg | INTRAMUSCULAR | Status: DC | PRN

## 2019-07-17 MED ORDER — GLYCOPYRROLATE 1 MG PO TABS: 1.0000 mg | ORAL_TABLET | ORAL | Status: DC | PRN

## 2019-07-17 MED ORDER — FUROSEMIDE 10 MG/ML IJ SOLN
INTRAMUSCULAR | Status: AC
  Filled 2019-07-17: qty 4

## 2019-07-17 MED ORDER — LORAZEPAM 2 MG/ML IJ SOLN
0.5000 mg | Freq: Once | INTRAMUSCULAR | Status: AC
  Administered 2019-07-17: 0.5 mg via INTRAVENOUS
  Filled 2019-07-17: qty 1

## 2019-07-17 MED ORDER — NALOXONE HCL 0.4 MG/ML IJ SOLN
INTRAMUSCULAR | Status: AC
  Administered 2019-07-17: 0.4 mg via INTRAVENOUS
  Filled 2019-07-17: qty 1

## 2019-07-17 MED ORDER — POTASSIUM CHLORIDE 10 MEQ/100ML IV SOLN: 10.0000 meq | INTRAVENOUS | Status: DC

## 2019-07-17 MED ORDER — NALOXONE HCL 0.4 MG/ML IJ SOLN: 0.4000 mg | INTRAMUSCULAR | Status: DC | PRN

## 2019-07-17 MED ORDER — GLYCOPYRROLATE 0.2 MG/ML IJ SOLN
0.2000 mg | INTRAMUSCULAR | Status: DC | PRN
  Administered 2019-07-17: 0.2 mg via INTRAVENOUS
  Filled 2019-07-17: qty 1

## 2019-07-21 ENCOUNTER — Ambulatory Visit: Payer: 59 | Admitting: Internal Medicine

## 2019-07-22 LAB — CULTURE, BLOOD (ROUTINE X 2)
Culture: NO GROWTH
Culture: NO GROWTH
Special Requests: ADEQUATE

## 2019-07-29 ENCOUNTER — Other Ambulatory Visit: Payer: Self-pay | Admitting: Adult Health Nurse Practitioner

## 2019-08-16 NOTE — Progress Notes (Signed)
Came into the patient's room as per patient's wife requested.Patient was very pale looking,eyes open but fixed.Skin still warn but patient was not breathing.Called in another nurse to re-assess and check his vital signs.Both nurses found vital signs were all ceased,no signs of life @ 1426.Wife at the bedside,made aware that her husband transitioned from life.M.D. made aware.

## 2019-08-16 NOTE — Progress Notes (Signed)
Called [place] [facility].Spoke to [employer].Referral [phone].Per Langley Gauss ,Patient was suitable for tissue donor only.

## 2019-08-16 NOTE — Significant Event (Signed)
Rapid Response Event Note  Overview: Called d/t AMS. Pt here with hepatic encephalopathy Time Called: 0603 Arrival Time: 0606 Event Type: Neurologic  Initial Focused Assessment: Pt thrashing back and forth in bed, will not follow commands or speak. Pt has snoring respirations and a blank stare. Pt will not blink to threat. Lungs rhonchi t/o. Abd large and distended. Pt has a weak cough. HR-130s, SBP-170, RR-30s, SpO2-88% on RA. Pt placed on 2L [place] with SpO2 increasing to 94%.  Interventions: 2L [place] DNR  0.4mg  narcan-no change 1mg  ativan 40mg  lasix Plan of Care (if not transferred): Pt DNR, wife on way to see pt. Plan to sedate further once wife is at bedside. Event Summary: Name of Physician Notified: Kennon Holter, NP at (PTA RRT)    at          [place], Carren Rang

## 2019-08-16 NOTE — Progress Notes (Signed)
Pt has become more confused throughout the night. RN has received two different calls regarding pt's heart rate going up, however, each time pt was checked on and he had a bowel movement. Pt denies any pain. RN will continue to monitor.

## 2019-08-16 NOTE — Consult Note (Signed)
NAME:  Billy [name], MRN:  [id number], DOB:  Jan 08, 1946, LOS: 0 ADMISSION DATE:  07/20/2019, CONSULTATION DATE:  01/02 REFERRING MD:  Dr. 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  Overnight of 01/02 patient became progressively encephalopathic with concern for inability to protect airway, therefore PCCM was consulted.  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  On chart review patient has had multiple hospitalizations in the last 12 months and was scheduled to see outpatient palliative care.  Per verbal report patient admitted presented to hospital  PCCM consulted overnight due to severe encephalopathy and fear of inability to protect airway before initiation of any aggressive measures. She states she understand patients condition and poor prognosis and wishes to pursue comfort measure and reinstate original DNR/DNI. She would like to be present at bedside prior to transition to comfort care.   Past Medical History   Past Medical History:  Diagnosis Date  . Acute deep vein thrombosis (DVT) of femoral vein of right lower extremity ([place]) 03/30/2019  . Acute posthemorrhagic anemia   . Alcoholic cirrhosis of liver ([place]) 09/24/2011   Manifestations are portal hypertension with esophageal varices, portal gastropathy, rectal varices and he may have portal colopathy. Ascites also present. Naive to HAV and HBV Claims he has had pneumococcal vaccine     . Angiodysplasia of colon 09/11/2011   Multiple in the right colon, ablated with APC. Question if this is portal colopathy.   . Anxiety   . Back pain   . Basal cell carcinoma   . DDD (degenerative disc disease), lumbar   . Depression    mild at most  . Diverticulosis of colon   . Erectile dysfunction   . Gastric ulcer 08/2011   small, antral ulcer  . GERD (gastroesophageal reflux disease)    takes Omeprazole prn  . GI hemorrhage 2006, 2013   esophagitis and 1+ varices, melena in 2013   . Grade I diastolic dysfunction   . Hearing loss   . Hepatorenal syndrome (HCC)-Type 2 06/14/2019  . Hydrocele   . Hypertension    takes Nadolol daily  . Narcotic dependence (HCC)    chronic back pain  . Portal hypertension ([place]) 08/2011  . 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 Sleep disturbance    takes Remeron nightly  . Thrombocytopenia, unspecified ([place])   . 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  No acute intracranial abnormality. 2.  Cerebral atrophy and small vessel ischemic change.  CXR 01/02 > No evidence of acute cardiopulmonary abnormality. Aortic atherosclerosis.  Micro Data:  COVID 01/02 > Negative  Blood cultures 01/02 >   Antimicrobials:  Ceftriaxone 01/02 >   Interim history/subjective:  Very agitated and encephalopathic, difficult to keep in bed. Unable to follow any commands.   Objective   Blood pressure (!) 160/106, pulse (!) 138, temperature 97.8 F (36.6 C), temperature source Oral, resp. rate (!) 24, height 6\' 3"  (1.905 m), weight 81 kg, SpO2  93 %.        Intake/Output Summary (Last 24 hours) at 23-Jul-2019 0716 Last data filed at 23-Jul-2019 [id number] Gross per 24 hour  Intake 60 ml  Output 0 ml  Net 60 ml   Filed Weights   08/05/2019 0846 07/24/2019 2050  Weight: 79.4 kg 81 kg    Examination: General:  Chronically ill appearing elderly male/male, in NAD HEENT: ETT, MM pink/moist, PERRL,  Neuro: Very encephalopathic, unable to redirect or follow any commands  CV: s1s2 regular rate and rhythm, no murmur, rubs, or gallops,  PULM:  Clear to ascultation, upper airway secretions, questionable ability to protect airway  GI: soft, bowel sounds hypoactive, very distended abdomen with extensive ascites,  Extremities: warm/dry, no  Edema GU: significant scrotal swelling  Skin: no rashes or lesions   Resolved Hospital Problem list     Assessment & Plan:  Alcoholic cirrhosis with varices/splenomegaly/thrombocytopenia/ascites/hepatorenal syndrome Severe hepatic encephalopathy   -With change in condition overnight PCCM was consulted to help manage critically ill patient.  Given extensive medical history and for survival wife was contacted regarding goals of care.  After discussion with night team decision was made to respect patient's original DNR/DNI.  Wife states she wishes to be at bedside prior to initiation of comfort measures.  Given decision to focus on comfort with no further aggressive measures needed for now.  As needed Haldol provided for agitation with improvement seen.  Once wife arrives likely should be on comfort measures.  Best practice:  Diet: N.p.o. Pain/Anxiety/Delirium protocol (if indicated): PRNs VAP protocol (if indicated): Nonapplicable DVT prophylaxis: SCD GI prophylaxis: PPI Glucose control: Monitor Mobility: Bedrest Code Status: DNR/DNI Family Communication: Wife updated over the phone Disposition: For  Labs   CBC: Recent Labs  Lab 07/18/2019 0908 Jul 23, 2019 0512  WBC 12.3* 19.5*  NEUTROABS 10.5*  --   HGB 10.1* 9.8*  HCT 31.8* 30.0*  MCV 93.8 90.4  PLT 149* [id number]    Basic Metabolic Panel: Recent Labs  Lab 08/03/2019 0908 July 23, 2019 0512  NA 140 139  K 3.3* 3.0*  CL 101 99  CO2 25 25  GLUCOSE 97 124*  BUN 35* 34*  CREATININE 2.09* 2.06*  CALCIUM 8.8*  8.8*   GFR: Estimated Creatinine Clearance: 37.7 mL/min (A) (by C-G formula based on SCr of 2.06 mg/dL (H)). Recent Labs  Lab 08/11/2019 0908 23-Jul-2019 0512  WBC 12.3* 19.5*    Liver Function Tests: Recent Labs  Lab 08/12/2019 0908 07-23-19 0512  AST 38 35  ALT 19 20  ALKPHOS 120 123  BILITOT 1.5* 1.8*  PROT 6.7 6.5  ALBUMIN 2.9* 2.8*   No results for input(s): LIPASE, AMYLASE in the last 168 hours. Recent Labs  Lab 07/25/2019 0908  AMMONIA 81*    ABG No results found for: PHART, PCO2ART, PO2ART, HCO3, TCO2, ACIDBASEDEF, O2SAT   Coagulation Profile: No results for input(s): INR, PROTIME in the last 168 hours.  Cardiac Enzymes: No results for input(s): CKTOTAL, CKMB, CKMBINDEX, TROPONINI in the last 168 hours.  HbA1C: No results found for: HGBA1C  CBG: Recent Labs  Lab 2019-07-23 0603  GLUCAP 113*    Review of Systems:   Unable to perform secondary to patient's encephalopathy  Past Medical History  He,  has a past medical history of Acute deep vein thrombosis (DVT) of femoral vein of right lower extremity ([place]) (03/30/2019), Acute posthemorrhagic anemia, Alcoholic cirrhosis of liver ([place]) (09/24/2011), Angiodysplasia of colon (09/11/2011), Anxiety, Back pain, Basal cell carcinoma, DDD (degenerative disc disease),  lumbar, Depression, Diverticulosis of colon, Erectile dysfunction, Gastric ulcer (08/2011), GERD (gastroesophageal reflux disease), GI hemorrhage (2006, 2013), Grade I diastolic dysfunction, Hearing loss, Hepatorenal syndrome (HCC)-Type 2 (06/14/2019), Hydrocele, Hypertension, Narcotic dependence ([place]), Portal hypertension ([place]) (08/2011), Rectal varices (09/11/2011), Sleep disturbance, Thrombocytopenia, unspecified ([place]), and Varices, esophageal ([place]) (08/2011).   Surgical History    Past Surgical History:  Procedure Laterality Date  . BIOPSY  08/27/2018   Procedure: BIOPSY;  Surgeon: Milus Banister, MD;  Location: [facility];  Service: Endoscopy;;  .  CATARACT EXTRACTION  2013   bilateral with IOL  . CHOLECYSTECTOMY  12/31/2012   Procedure: LAPAROSCOPIC CHOLECYSTECTOMY;  Surgeon: Zenovia Jarred, MD;  Location: [facility];  Service: General;;  . COLONOSCOPY  2001, 200411/02/2006  . COLONOSCOPY  09/11/2011   Procedure: COLONOSCOPY;  Surgeon: Gatha Mayer, MD;  [facility];  Service: Endoscopy;  Laterality: N/A;  . COLONOSCOPY    . COLONOSCOPY WITH PROPOFOL N/A 06/07/2019   Procedure: COLONOSCOPY WITH PROPOFOL;  Surgeon: Ladene Artist, MD;  [facility];  Service: Endoscopy;  Laterality: N/A;  . ESOPHAGEAL BANDING N/A 05/02/2017   Procedure: ESOPHAGEAL BANDING;  Surgeon: Gatha Mayer, MD;  [facility];  Service: Endoscopy;  Laterality: N/A;  . ESOPHAGEAL BANDING  06/09/2018   Procedure: ESOPHAGEAL BANDING;  Surgeon: Gatha Mayer, MD;  [facility];  Service: Endoscopy;;  . ESOPHAGEAL BANDING  08/27/2018   Procedure: ESOPHAGEAL BANDING;  Surgeon: Milus Banister, MD;  [facility];  Service: Endoscopy;;  . ESOPHAGOGASTRODUODENOSCOPY  2006  . ESOPHAGOGASTRODUODENOSCOPY  09/11/2011   Procedure: ESOPHAGOGASTRODUODENOSCOPY (EGD);  Surgeon: Gatha Mayer, MD;  [facility];  Service: Endoscopy;  Laterality: N/A;  . ESOPHAGOGASTRODUODENOSCOPY N/A 08/27/2018   Procedure: ESOPHAGOGASTRODUODENOSCOPY (EGD);  Surgeon: Milus Banister, MD;  [facility];  Service: Endoscopy;  Laterality: N/A;  . ESOPHAGOGASTRODUODENOSCOPY (EGD) WITH PROPOFOL N/A 04/13/2017   Procedure: ESOPHAGOGASTRODUODENOSCOPY (EGD) WITH PROPOFOL;  Surgeon: Doran Stabler, MD;  [facility];  Service: Gastroenterology;  Laterality: N/A;  . ESOPHAGOGASTRODUODENOSCOPY (EGD) WITH PROPOFOL N/A 05/02/2017   Procedure: ESOPHAGOGASTRODUODENOSCOPY (EGD) WITH PROPOFOL;  Surgeon: Gatha Mayer, MD;  [facility];  Service: Endoscopy;  Laterality: N/A;  . ESOPHAGOGASTRODUODENOSCOPY (EGD) WITH  PROPOFOL N/A 09/30/2017   Procedure: ESOPHAGOGASTRODUODENOSCOPY (EGD) WITH PROPOFOL;  Surgeon: Gatha Mayer, MD;  [facility];  Service: Endoscopy;  Laterality: N/A;  . ESOPHAGOGASTRODUODENOSCOPY (EGD) WITH PROPOFOL N/A 06/09/2018   Procedure: ESOPHAGOGASTRODUODENOSCOPY (EGD) WITH PROPOFOL;  Surgeon: Gatha Mayer, MD;  [facility];  Service: Endoscopy;  Laterality: N/A;  . ESOPHAGOGASTRODUODENOSCOPY (EGD) WITH PROPOFOL N/A 01/05/2019   Procedure: ESOPHAGOGASTRODUODENOSCOPY (EGD) WITH PROPOFOL;  Surgeon: Gatha Mayer, MD;  [facility];  Service: Endoscopy;  Laterality: N/A;  . ESOPHAGOGASTRODUODENOSCOPY (EGD) WITH PROPOFOL N/A 05/30/2019   Procedure: ESOPHAGOGASTRODUODENOSCOPY (EGD) WITH PROPOFOL;  Surgeon: Carol Ada, MD;  [facility];  Service: Endoscopy;  Laterality: N/A;  . ESOPHAGOGASTRODUODENOSCOPY (EGD) WITH PROPOFOL N/A 06/07/2019   Procedure: ESOPHAGOGASTRODUODENOSCOPY (EGD) WITH PROPOFOL;  Surgeon: Ladene Artist, MD;  [facility];  Service: Endoscopy;  Laterality: N/A;  . EUS N/A 08/27/2018   Procedure: UPPER ENDOSCOPIC ULTRASOUND (EUS) RADIAL;  Surgeon: Milus Banister, MD;  [facility];  Service: Endoscopy;  Laterality: N/A;  . EYE SURGERY    . FLEXIBLE SIGMOIDOSCOPY  01/07/2012   Procedure: FLEXIBLE SIGMOIDOSCOPY;  Surgeon: Lafayette Dragon, MD;  [facility];  Service: Endoscopy;  Laterality: N/A;  .  HOT HEMOSTASIS N/A 06/07/2019   Procedure: HOT HEMOSTASIS (ARGON PLASMA COAGULATION/BICAP);  Surgeon: Ladene Artist, MD;  [facility];  Service: Endoscopy;  Laterality: N/A;  . IR IVC FILTER PLMT / S&I Burke Keels GUID/MOD SED  06/07/2019  . IR RADIOLOGIST EVAL & MGMT  05/26/2019  . KNEE ARTHROSCOPY  2008   Right  . KNEE ARTHROSCOPY  2008   Left  . POLYPECTOMY    . [place] THORA/PARACENTESIS  10/28/2011  . Varicocele repair  [phone]     Social History   reports that he has been smoking  cigarettes and e-cigarettes. He has been smoking about 0.50 packs per day. He has never used smokeless tobacco. He reports previous alcohol use. He reports current drug use. Drug: Marijuana.   Family History   His family history includes Diabetes in his mother; Liver cancer in his cousin and paternal uncle; Lung cancer in his brother; Prostate cancer in his brother; Stroke in his mother. There is no history of Colon cancer, Stomach cancer, Esophageal cancer, Colon polyps, Rectal cancer, or Pancreatic cancer.   Allergies Allergies  Allergen Reactions  . Yellow Jacket Venom Anaphylaxis, Shortness Of Breath and Other (See Comments)    Throat closes up/eye swell shut  . Eliquis [Apixaban] Other (See Comments)    Causes bleeding  . Nsaids Other (See Comments)    Stomach pain, Bleeding risk     Home Medications  Prior to Admission medications   Medication Sig Start Date End Date Taking? Authorizing Provider  buPROPion (WELLBUTRIN SR) 150 MG 12 hr tablet TAKE 1 TABLET BY MOUTH EVERY DAY Patient taking differently: Take 150 mg by mouth daily.  06/01/19  Yes Viviana Simpler I, MD  ferrous sulfate 325 (65 FE) MG EC tablet Take 325 mg by mouth every other day.   Yes [provider]  fluocinonide cream (LIDEX) 0.05 % APPLY TO AFFECTED AREA TWICE A DAY AS NEEDED FOR SKIN RASH/IRRITATION 07/07/19  Yes [name], MD  furosemide (LASIX) 40 MG tablet Take 1 tablet (40 mg total) by mouth 2 (two) times daily. 06/23/19 07/23/19 Yes Adele Barthel D, MD  HYDROcodone-acetaminophen (NORCO/VICODIN) 5-325 MG tablet Take 1 tablet by mouth 3 (three) times daily as needed (for pain.). Patient taking differently: Take 1 tablet by mouth 3 (three) times daily as needed for moderate pain (for pain.).  06/25/19  Yes [name], MD  lactulose (CHRONULAC) 10 GM/15ML solution Take 30 mLs (20 g total) by mouth daily. Please keep 3-4 bowel movement daily, please call your GI doctor if your bm less than  three times a day or more than 4 times a day. Your GI doctor will help you adjust lactulose dose. Patient taking differently: Take 10 g by mouth daily. Please keep 3-4 bowel movement daily, please call your GI doctor if your bm less than three times a day or more than 4 times a day. Your GI doctor will help you adjust lactulose dose. 06/25/19  Yes [name], MD  LORazepam (ATIVAN) 0.5 MG tablet Take 0.5-1 tablets (0.25-0.5 mg total) by mouth 2 (two) times daily as needed for anxiety. 06/25/19  Yes [name], MD  midodrine (PROAMATINE) 2.5 MG tablet Take 3 tablets (7.5 mg total) by mouth 3 (three) times daily with meals. 07/02/19  Yes [name], MD  mirtazapine (REMERON) 45 MG tablet TAKE 1 TABLET (45 MG TOTAL) BY MOUTH AT BEDTIME. 05/12/19  Yes [name], MD  Multiple Vitamin (MULTIVITAMIN WITH MINERALS)  TABS Take 1 tablet by mouth daily.    Yes [provider]  pantoprazole (PROTONIX) 40 MG tablet Take 1 tablet (40 mg total) by mouth 2 (two) times daily before a meal. Patient taking differently: Take 40 mg by mouth 2 (two) times daily.  06/09/19 07/22/2019 Yes Florencia Reasons, MD  silver sulfADIAZINE (SILVADENE) 1 % cream Apply topically 2 (two) times daily. To left thigh Patient taking differently: Apply 1 application topically 2 (two) times daily. To left thigh 06/09/19  Yes Florencia Reasons, MD  sodium bicarbonate 650 MG tablet Take 1 tablet (650 mg total) by mouth 3 (three) times daily. 06/23/19  Yes Adele Barthel D, MD  SODIUM FLUORIDE 5000 PPM 1.1 % PSTE 1 application by Mouth Rinse route See admin instructions. unknown 07/06/19  Yes [provider]  vitamin C (ASCORBIC ACID) 250 MG tablet Take 500 mg by mouth daily. Gummie   Yes [provider]     Critical care time:   CRITICAL CARE Performed by: Johnsie Cancel   Total critical care time: 50 minutes  Critical care time was exclusive of separately billable procedures and treating other  patients.  Critical care was necessary to treat or prevent imminent or life-threatening deterioration.  Critical care was time spent personally by me on the following activities: development of treatment plan with patient and/or surrogate as well as nursing, discussions with consultants, evaluation of patient's response to treatment, examination of patient, obtaining history from patient or surrogate, ordering and performing treatments and interventions, ordering and review of laboratory studies, ordering and review of radiographic studies, pulse oximetry and re-evaluation of patient's condition.   Johnsie Cancel, NP-C [facility] Contact / Pager information can be found on Amion  2019/08/08, 7:40 AM

## 2019-08-16 NOTE — Death Summary Note (Signed)
DEATH SUMMARY   Patient Details  Name: Billy [name] MRN: [id number] DOB: [birth year]  Admission/Discharge Information   Admit Date:  2019-08-02  Date of Death:    Time of Death:    Length of Stay: 0  Referring Physician: [name], MD   Reason(s) for Hospitalization    Diagnoses  Preliminary cause of death: decompensated liver cirrhosis Secondary Diagnoses (including complications and co-morbidities):  Principal Problem:   Hepatic encephalopathy ([place]) Active Problems:   Essential hypertension, benign   Narcotic dependence ([place])   Right leg DVT ([place])   Chronic renal disease, stage III   Hepatorenal syndrome (HCC)-Type 2   DNR (do not resuscitate)   Comfort measures only status   Brief Hospital Course (including significant findings, care, treatment, and services provided and events leading to death)  Billy [name] is a 74 y.o. year old male with decompensated cirrhosis with hepatic encephalopathy.  Patient's clinical condition rapidly deteriorated overnight,  PCCM was called to see patient but as he is a DNR, they spoke with wife and she came to the hospital.  We had a discussion regarding his poor prognosis and appearance of distress.   transitioned to comfort care Died 14:26  Pertinent Labs and Studies  Significant Diagnostic Studies CT Head Wo Contrast  Result Date: 08/02/2019 CLINICAL DATA:  Altered mental status. Falls. Hematoma to back of head. On blood thinners. EXAM: CT HEAD WITHOUT CONTRAST TECHNIQUE: Contiguous axial images were obtained from the base of the skull through the vertex without intravenous contrast. COMPARISON:  05/28/2019 FINDINGS: Brain: Mild low density in the periventricular white matter likely related to small vessel disease. Mildly age advanced cerebral atrophy. Right temporal lobe hypoattenuation on 14/3 is similar to on the prior exam, favored to be related to remote ischemia. No mass lesion, hemorrhage, hydrocephalus, acute infarct,  intra-axial, or extra-axial fluid collection. Vascular: No hyperdense vessel or unexpected calcification. Skull: Right parietooccipital scalp soft tissue swelling, including on 77/4. This is relatively mild. Hyperostosis frontalis interna. No skull fracture. Sinuses/Orbits: Normal imaged portions of the orbits and globes. Clear paranasal sinuses and mastoid air cells. Other: None. IMPRESSION: 1.  No acute intracranial abnormality. 2.  Cerebral atrophy and small vessel ischemic change. Electronically Signed   By: Abigail Miyamoto M.D.   On: 08/02/19 11:53   US Renal  Result Date: 06/17/2019 CLINICAL DATA:  Acute kidney injury EXAM: RENAL / URINARY TRACT ULTRASOUND COMPLETE COMPARISON:  06/08/2019 FINDINGS: Right Kidney: Renal measurements: 9.9 x 4.5 x 5.2 cm = volume: 119.1 mL. Cortex is echogenic. Negative for hydronephrosis or mass Left Kidney: Renal measurements: 9 x 5 x 4.7 cm = volume: 107.8 mL. Cortex is echogenic. Negative for hydronephrosis or mass. Bladder: Appears normal for degree of bladder distention. Other: Cirrhotic morphology of the liver. Moderate ascites within the abdomen and pelvis. IMPRESSION: 1. Echogenic kidneys consistent with medical renal disease. No hydronephrosis 2. Cirrhosis of the liver with moderate ascites Electronically Signed   By: Donavan Foil M.D.   On: 06/17/2019 22:42   US Paracentesis  Result Date: 06/19/2019 INDICATION: Patient with history of alcoholic cirrhosis, chronic kidney disease, recurrent ascites, chronic diastolic heart failure; request received for diagnostic and therapeutic paracentesis. EXAM: ULTRASOUND GUIDED DIAGNOSTIC AND THERAPEUTIC PARACENTESIS MEDICATIONS: None COMPLICATIONS: None immediate. PROCEDURE: Informed written consent was obtained from the patient after a discussion of the risks, benefits and alternatives to treatment. A timeout was performed prior to the initiation of the procedure. Initial ultrasound scanning demonstrates a large amount of  ascites within the right lower abdominal quadrant. The right lower abdomen was prepped and draped in the usual sterile fashion. 1% lidocaine was used for local anesthesia. Following this, a 19 gauge, 7-cm, Yueh catheter was introduced. An ultrasound image was saved for documentation purposes. The paracentesis was performed. The catheter was removed and a dressing was applied. The patient tolerated the procedure well without immediate post procedural complication. Patient received post-procedure intravenous albumin; see nursing notes for details. FINDINGS: A total of approximately 3.7 liters of light yellow fluid was removed. Samples were sent to the laboratory as requested by the clinical team. IMPRESSION: Successful ultrasound-guided diagnostic and therapeutic paracentesis yielding 3.7 liters of peritoneal fluid. Read by: Rowe Robert, PA-C Electronically Signed   By: Lucrezia Europe M.D.   On: 06/19/2019 14:15   DG Chest Port 1 View  Result Date: 07/20/2019 CLINICAL DATA:  Confusion. EXAM: PORTABLE CHEST 1 VIEW COMPARISON:  Chest radiograph 05/28/2019 FINDINGS: Heart size within normal limits.  Aortic atherosclerosis. There is no airspace consolidation within the lungs. No evidence of pleural effusion or pneumothorax. No displaced fracture is identified. Overlying cardiac monitoring leads. IMPRESSION: No evidence of acute cardiopulmonary abnormality. Aortic atherosclerosis. Electronically Signed   By: Kellie Simmering DO   On: 08/15/2019 10:13    Microbiology Recent Results (from the past 240 hour(s))  SARS CORONAVIRUS 2 (TAT 6-24 HRS) Nasopharyngeal Nasopharyngeal Swab     Status: None   Collection Time: 07/18/2019 11:54 AM   Specimen: Nasopharyngeal Swab  Result Value Ref Range Status   SARS Coronavirus 2 NEGATIVE NEGATIVE Final    Comment: (NOTE) SARS-CoV-2 target nucleic acids are NOT DETECTED. The SARS-CoV-2 RNA is generally detectable in upper and lower respiratory specimens during the acute phase of  infection. Negative results do not preclude SARS-CoV-2 infection, do not rule out co-infections with other pathogens, and should not be used as the sole basis for treatment or other patient management decisions. Negative results must be combined with clinical observations, patient history, and epidemiological information. The expected result is Negative. Fact Sheet for Patients: SugarRoll.be Fact Sheet for Healthcare Providers: https://www.woods-mathews.com/ This test is not yet approved or cleared by the [place] FDA and  has been authorized for detection and/or diagnosis of SARS-CoV-2 by FDA under an Emergency Use Authorization (EUA). This EUA will remain  in effect (meaning this test can be used) for the duration of the COVID-19 declaration under Section 56 4(b)(1) of the Act, 21 U.S.C. section 360bbb-3(b)(1), unless the authorization is terminated or revoked sooner. Performed at [facility], [place] [address], [place], [place] [address]     Lab Basic Metabolic Panel: Recent Labs  Lab 08/01/2019 0908 07/20/2019 0512 07-20-19 0702  NA 140 139  --   K 3.3* 3.0*  --   CL 101 99  --   CO2 25 25  --   GLUCOSE 97 124*  --   BUN 35* 34*  --   CREATININE 2.09* 2.06*  --   CALCIUM 8.8* 8.8*  --   MG  --   --  1.6*   Liver Function Tests: Recent Labs  Lab 08/14/2019 0908 2019-07-20 0512  AST 38 35  ALT 19 20  ALKPHOS 120 123  BILITOT 1.5* 1.8*  PROT 6.7 6.5  ALBUMIN 2.9* 2.8*   No results for input(s): LIPASE, AMYLASE in the last 168 hours. Recent Labs  Lab 08/01/2019 0908 07/20/19 0702  AMMONIA 81* 235*   CBC: Recent Labs  Lab 07/16/19 0908 2019/07/20  0512  WBC 12.3* 19.5*  NEUTROABS 10.5*  --   HGB 10.1* 9.8*  HCT 31.8* 30.0*  MCV 93.8 90.4  PLT 149* 150   Cardiac Enzymes: No results for input(s): CKTOTAL, CKMB, CKMBINDEX, TROPONINI in the last 168 hours. Sepsis Labs: Recent Labs  Lab 08/07/2019 0908  07-23-19 0512  WBC 12.3* 19.5*     Geradine Girt 2019/07/23, 2:54 PM

## 2019-08-16 NOTE — Progress Notes (Signed)
Progress Note    Billy [name]  [id number] DOB: 1945-09-05  DOA: 07/22/2019 PCP: [name], MD    Brief Narrative:     Medical records reviewed and are as summarized below:  Billy [name] is an 74 y.o. 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 He was previously hospitalized from 12/3-9 for decompensated cirrhosis with ascites and esophageal varices.  He was started on Lasix 80 mg BID and underwent paracentesis.  Given his overall poor prognosis, palliative care was consulted and the patient was referred for outpatient palliative care and has a telephone consult scheduled for 1/14.  He reports being in the hospital 3 times recently.  He has fallen and hit his nose.  He is trying to provide a history but is clearly confused.  He acknowledges kidney problems.  He has not been taking his lactulose because "uh, it's a very po, uh, what's that word I'm looking for... I'm just not normal right now.  I was not this way before when I was at the other hospital... So yeah, they said I had stage 3 dangie... I don't know, I don't know what I'm gonna do." The night after admission patient became un-responsive.  Plan is to transition to comfort care.    Assessment/Plan:   Principal Problem:   Hepatic encephalopathy (HCC) Active Problems:   Essential hypertension, benign   Narcotic dependence (HCC)   Right leg DVT (HCC)   Chronic renal disease, stage III   Hepatorenal syndrome (HCC)-Type 2   Patient's clinical condition rapidly deteriorated overnight,  PCCM was called to see patient but as he is a DNR, they spoke with wife and she came to the hospital.  We had a discussion regarding his poor prognosis and appearance of distress.  Plan is to transition to comfort care.   Orders placed and we will titrate medications as needed  Hepatic encephalopathy -comfort focus  Hepatorenal syndrome -comfort focus  Narcotic dependence Recent R leg DVT   Family Communication/Anticipated D/C date and plan/Code Status   DNR/spoke with wife at bedside/comfort focus   Medical Consultants:    PCCM  Palliative care  Subjective:   Appears uncomfortable, non-response- changed overnight  Objective:    Vitals:   07/25/2019 2052 06-Aug-2019 0547 08-06-19 0550 06-Aug-2019 0752  BP: 140/89 (!) 160/106  (!) 195/93  Pulse: (!) 106 (!) 138  (!) 138  Resp: 16 (!) 32 (!) 24   Temp: 98.6 F (37 C) 97.8 F (36.6 C)    TempSrc: Oral Oral    SpO2: 97% 93%  92%  Weight:      Height:        Intake/Output Summary (Last 24 hours) at 08-06-19 1034 Last data filed at 08-06-2019 0900 Gross per 24 hour  Intake 60 ml  Output 0 ml  Net 60 ml   Filed Weights   07/24/2019 0846 07/24/2019 2050  Weight: 79.4 kg 81 kg    Exam: Moving around in bed, moaning but not interactive Wet sounding vocals No LE edema Not following commands Appears to be in distress  Data Reviewed:   I have personally reviewed following labs and imaging studies:  Labs: Labs show the following:   Basic Metabolic Panel: Recent Labs  Lab 07/24/2019 0908 2019-08-06 0512 08/06/19 0702  NA 140 139  --  K 3.3* 3.0*  --   CL 101 99  --   CO2 25 25  --   GLUCOSE 97 124*  --   BUN 35* 34*  --   CREATININE 2.09* 2.06*  --   CALCIUM 8.8* 8.8*  --   MG  --   --  1.6*   GFR Estimated Creatinine Clearance: 37.7 mL/min (A) (by C-G formula based on SCr of 2.06 mg/dL (H)). Liver Function Tests: Recent Labs  Lab 08/10/2019 0908 08-13-2019 0512  AST 38 35  ALT 19 20  ALKPHOS 120 123  BILITOT 1.5* 1.8*  PROT 6.7 6.5  ALBUMIN 2.9* 2.8*   No results for input(s): LIPASE, AMYLASE in the last 168 hours. Recent Labs  Lab 08/07/2019 0908 08-13-19 0702  AMMONIA 81* 235*   Coagulation profile No results  for input(s): INR, PROTIME in the last 168 hours.  CBC: Recent Labs  Lab 08/13/2019 0908 13-Aug-2019 0512  WBC 12.3* 19.5*  NEUTROABS 10.5*  --   HGB 10.1* 9.8*  HCT 31.8* 30.0*  MCV 93.8 90.4  PLT 149* 150   Cardiac Enzymes: No results for input(s): CKTOTAL, CKMB, CKMBINDEX, TROPONINI in the last 168 hours. BNP (last 3 results) No results for input(s): PROBNP in the last 8760 hours. CBG: Recent Labs  Lab 08-13-19 0603  GLUCAP 113*   D-Dimer: No results for input(s): DDIMER in the last 72 hours. Hgb A1c: No results for input(s): HGBA1C in the last 72 hours. Lipid Profile: No results for input(s): CHOL, HDL, LDLCALC, TRIG, CHOLHDL, LDLDIRECT in the last 72 hours. Thyroid function studies: No results for input(s): TSH, T4TOTAL, T3FREE, THYROIDAB in the last 72 hours.  Invalid input(s): FREET3 Anemia work up: No results for input(s): VITAMINB12, FOLATE, FERRITIN, TIBC, IRON, RETICCTPCT in the last 72 hours. Sepsis Labs: Recent Labs  Lab 07/21/2019 0908 Aug 13, 2019 0512  WBC 12.3* 19.5*    Microbiology Recent Results (from the past 240 hour(s))  SARS CORONAVIRUS 2 (TAT 6-24 HRS) Nasopharyngeal Nasopharyngeal Swab     Status: None   Collection Time: 07/30/2019 11:54 AM   Specimen: Nasopharyngeal Swab  Result Value Ref Range Status   SARS Coronavirus 2 NEGATIVE NEGATIVE Final    Comment: (NOTE) SARS-CoV-2 target nucleic acids are NOT DETECTED. The SARS-CoV-2 RNA is generally detectable in upper and lower respiratory specimens during the acute phase of infection. Negative results do not preclude SARS-CoV-2 infection, do not rule out co-infections with other pathogens, and should not be used as the sole basis for treatment or other patient management decisions. Negative results must be combined with clinical observations, patient history, and epidemiological information. The expected result is Negative. Fact Sheet for Patients: SugarRoll[employer]be Fact  Sheet for Healthcare Providers: https://www.woods-mathews.com/ This test is not yet approved or cleared by the [place] FDA and  has been authorized for detection and/or diagnosis of SARS-CoV-2 by FDA under an Emergency Use Authorization (EUA). This EUA will remain  in effect (meaning this test can be used) for the duration of the COVID-19 declaration under Section 56 4(b)(1) of the Act, 21 U.S.C. section 360bbb-3(b)(1), unless the authorization is terminated or revoked sooner. Performed at [facility], [place] [address] [place], [place] [address]     Procedures and diagnostic studies:  CT Head Wo Contrast  Result Date: 08/01/2019 CLINICAL DATA:  Altered mental status. Falls. Hematoma to back of head. On blood thinners. EXAM: CT HEAD WITHOUT CONTRAST TECHNIQUE: Contiguous axial images were obtained from the base of the skull  through the vertex without intravenous contrast. COMPARISON:  05/28/2019 FINDINGS: Brain: Mild low density in the periventricular white matter likely related to small vessel disease. Mildly age advanced cerebral atrophy. Right temporal lobe hypoattenuation on 14/3 is similar to on the prior exam, favored to be related to remote ischemia. No mass lesion, hemorrhage, hydrocephalus, acute infarct, intra-axial, or extra-axial fluid collection. Vascular: No hyperdense vessel or unexpected calcification. Skull: Right parietooccipital scalp soft tissue swelling, including on 77/4. This is relatively mild. Hyperostosis frontalis interna. No skull fracture. Sinuses/Orbits: Normal imaged portions of the orbits and globes. Clear paranasal sinuses and mastoid air cells. Other: None. IMPRESSION: 1.  No acute intracranial abnormality. 2.  Cerebral atrophy and small vessel ischemic change. Electronically Signed   By: Abigail Miyamoto M.D.   On: 08/03/2019 11:53   DG Chest Port 1 View  Result Date: 07/19/2019 CLINICAL DATA:  Confusion. EXAM: PORTABLE CHEST 1 VIEW COMPARISON:   Chest radiograph 05/28/2019 FINDINGS: Heart size within normal limits.  Aortic atherosclerosis. There is no airspace consolidation within the lungs. No evidence of pleural effusion or pneumothorax. No displaced fracture is identified. Overlying cardiac monitoring leads. IMPRESSION: No evidence of acute cardiopulmonary abnormality. Aortic atherosclerosis. Electronically Signed   By: Kellie Simmering DO   On: 07/16/2019 10:13    Medications:    furosemide       furosemide  40 mg Intravenous Once   furosemide  40 mg Oral BID   lactulose  300 mL Rectal Once   Continuous Infusions:   LOS: 0 days   Geradine Girt  [facility]   How to contact the [facility] Attending or Consulting provider [place] or covering provider during after hours [place], for this patient?  1. Check the care team in [facility] and look for a) attending/consulting [facility] provider listed and b) the [facility] team listed 2. Log into [email or web address] and use [place]'s universal password to access. If you do not have the password, please contact the hospital operator. 3. Locate the [facility] provider you are looking for under [facility] and page to a number that you can be directly reached. 4. If you still have difficulty reaching the provider, please page the [facility] (Director on Call) for the Hospitalists listed on amion for assistance.  2019-08-13, 10:34 AM

## 2019-08-16 NOTE — Progress Notes (Signed)
The chaplain visited with the family at bedside as the patient is nearing end of life.  The wife expressed regret at being in the hospital because the husband did not want to die at the hospital.  The chaplain provided supportive conversation.  The wife was also hesitant to call other family members.  Through conversation she recognized the importance of saying goodbye and began calling the husbands other loved ones.  The chaplain will follow-up later today and will respond if needed.  Brion Aliment Chaplain Resident For questions concerning this note please contact me by pager [phone]

## 2019-08-16 NOTE — Progress Notes (Signed)
PCCM progress:  Stat consult called overnight, apparently pt was DNR, but had mentioned reversing this earlier in the shift.  Developed worsening encephalopathy to the point there was concern he was not protecting his airway.  I spoke with patient's wife, Billy [name], who confirmed that patient had documented desire for DNR status and has paperwork on file to confirm this.  She did not feel that patient would want intubation or CPR if thinking clearly and wishes for previously established DNR to be honored.  She is en route to the hospital and full consult is to follow.

## 2019-08-16 NOTE — Progress Notes (Signed)
Pt is not responding to RN and eyes are fixed. Pt is rolling back and forth in the bed snoring and grunting. Vitals were obtained and Rapid and on-call MD were called. PT was alert and oriented x 4 prior to this. MD, Rapid, and Critical Care came to assess and call wife to inform of condition. Wife stated that pt needs to be a DNR and wants it changed back.Wife is on the way to the hospital at this time. Pt was given Narcan, Ativan, and Haldol without much success. Pt is supposed to be transferred off of the unit.   Eleanora Neighbor, RN

## 2019-08-16 DEATH — deceased

## 2019-10-04 ENCOUNTER — Ambulatory Visit: Payer: 59 | Admitting: Internal Medicine

## 2021-05-23 IMAGING — US IR IVC FILTER PLMT / S&I /IMG GUID/MOD SED
1 series · 1 of 1 positions shown · non-contrast
Comparison: none

INDICATION: 73-year-old with recent diagnosis of right lower extremity DVT in
March 2019. He was subsequently placed on Eliquis but presents
with bleeding of esophageal/gastric varices. He has cirrhosis and
portal hypertension. This represents an absolute contraindication to
further anticoagulation and he is therefore a candidate for PE
prophylaxis by caval interruption. He presents for placement of an
IVC filter.

[Series 1: (id) · 1 of 1 slices shown]
[im 1/1]
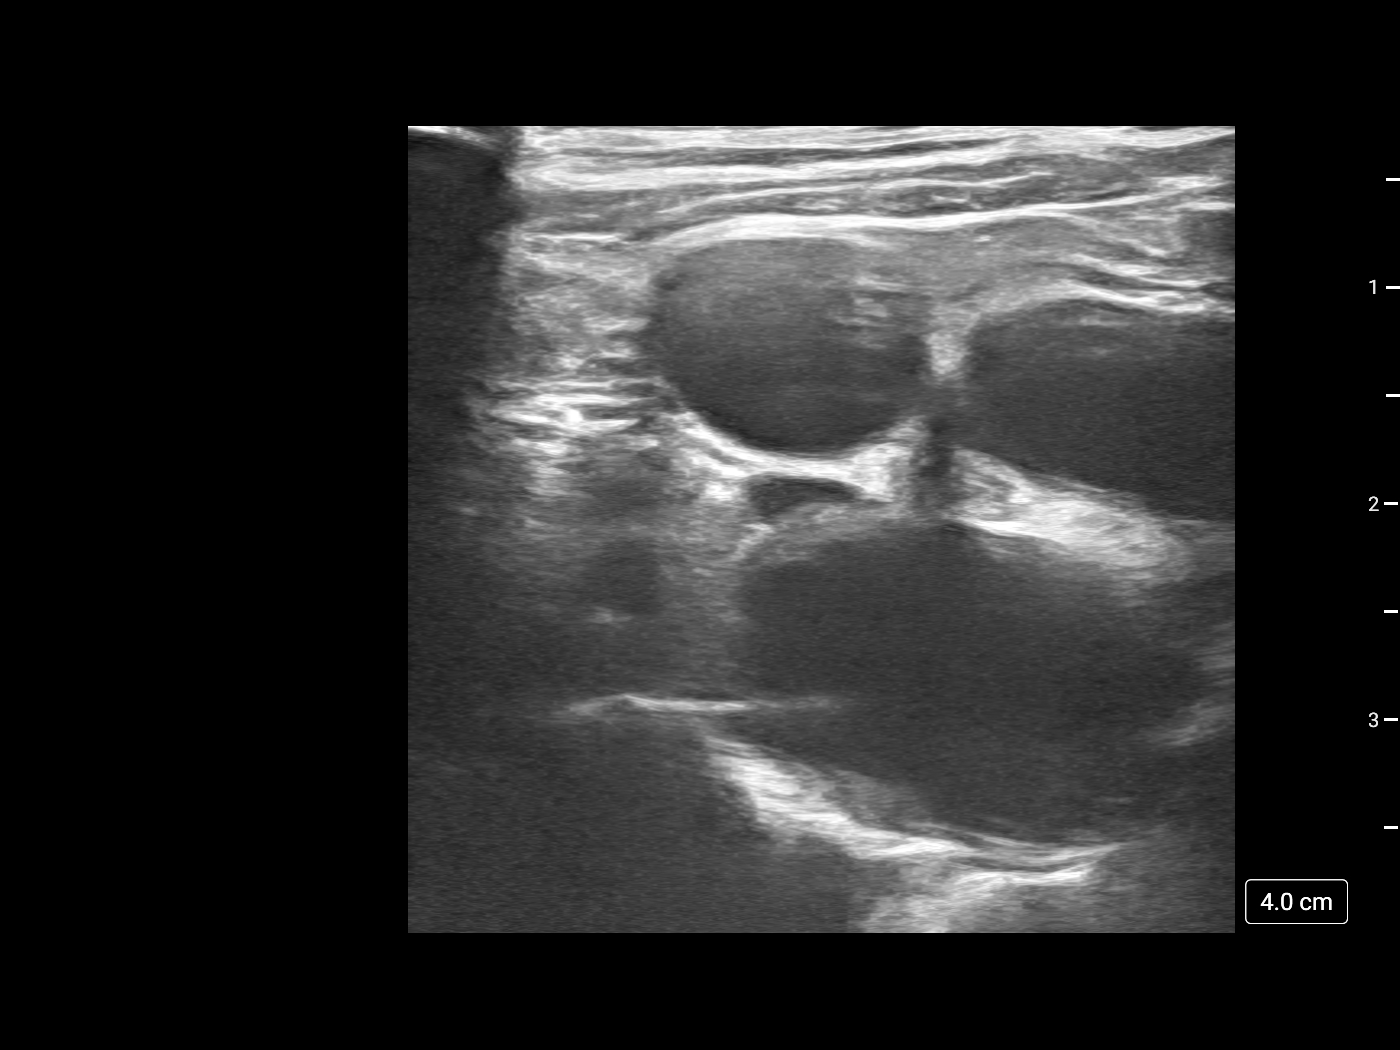

[1 of 1 positions shown; findings below may reference images not displayed]

EXAM:
ULTRASOUND GUIDANCE FOR VASCULARACCESS

IVC CATHETERIZATION AND VENOGRAM

IVC FILTER INSERTION

MEDICATIONS:
None.

ANESTHESIA/SEDATION:
Fentanyl 100 mcg IV; Versed 2 mg IV

Moderate Sedation Time:  12 minutes

The patient was continuously monitored during the procedure by the
interventional radiology nurse under my direct supervision.

FLUOROSCOPY TIME:  Fluoroscopy Time: 0 minutes 30 seconds (165 mGy).

COMPLICATIONS:
None immediate.

PROCEDURE:
Informed written consent was obtained from the patient after a
thorough discussion of the procedural risks, benefits and
alternatives. All questions were addressed. Maximal Sterile Barrier
Technique was utilized including caps, mask, sterile gowns, sterile
gloves, sterile drape, hand hygiene and skin antiseptic. A timeout
was performed prior to the initiation of the procedure.

Maximal barrier sterile technique utilized including caps, mask,
sterile gowns, sterile gloves, large sterile drape, hand hygiene,
and Betadine prep.

Under sterile condition and local anesthesia, right internal jugular
venous access was performed with ultrasound. An ultrasound image was
saved and sent to PACS. Over a guidewire, the IVC filter delivery
sheath and inner dilator were advanced into the IVC just above the
IVC bifurcation. Contrast injection was performed for an IVC
venogram.

Through the delivery sheath, a retrievable Denali IVC filter was
deployed below the level of the renal veins and above the IVC
bifurcation. Limited post deployment venacavagram was performed.

The delivery sheath was removed and hemostasis was obtained with
manual compression. A dressing was placed. The patient tolerated the
procedure well without immediate post procedural complication.
FINDINGS: The IVC is patent. No evidence of thrombus, stenosis, or occlusion.
No variant venous anatomy. Successful placement of the IVC filter
below the level of the renal veins.
IMPRESSION: Successful ultrasound and fluoroscopically guided placement of an
infrarenal retrievable IVC filter via right jugular approach.

PLAN:
This IVC filter is potentially retrievable. The patient will be
approximately 8-12 weeks. Further recommendations regarding filter
retrieval, continued surveillance or declaration of device
permanence, will be made at that time.
